# Patient Record
Sex: Female | Born: 1955
Health system: Southern US, Community
[De-identification: ages and names within clinical notes are randomized; demographics above are authoritative.]

## PROBLEM LIST (undated history)

## (undated) DIAGNOSIS — J189 Pneumonia, unspecified organism: Secondary | ICD-10-CM

## (undated) DIAGNOSIS — E669 Obesity, unspecified: Secondary | ICD-10-CM

## (undated) DIAGNOSIS — F32A Depression, unspecified: Secondary | ICD-10-CM

## (undated) DIAGNOSIS — E739 Lactose intolerance, unspecified: Secondary | ICD-10-CM

## (undated) DIAGNOSIS — R0683 Snoring: Secondary | ICD-10-CM

## (undated) DIAGNOSIS — R0600 Dyspnea, unspecified: Secondary | ICD-10-CM

## (undated) DIAGNOSIS — Z8489 Family history of other specified conditions: Secondary | ICD-10-CM

## (undated) DIAGNOSIS — I82409 Acute embolism and thrombosis of unspecified deep veins of unspecified lower extremity: Secondary | ICD-10-CM

## (undated) DIAGNOSIS — M1990 Unspecified osteoarthritis, unspecified site: Secondary | ICD-10-CM

## (undated) DIAGNOSIS — M545 Low back pain, unspecified: Secondary | ICD-10-CM

## (undated) DIAGNOSIS — I4891 Unspecified atrial fibrillation: Secondary | ICD-10-CM

## (undated) DIAGNOSIS — G8929 Other chronic pain: Secondary | ICD-10-CM

## (undated) DIAGNOSIS — M2141 Flat foot [pes planus] (acquired), right foot: Secondary | ICD-10-CM

## (undated) DIAGNOSIS — M2550 Pain in unspecified joint: Secondary | ICD-10-CM

## (undated) DIAGNOSIS — I10 Essential (primary) hypertension: Secondary | ICD-10-CM

## (undated) DIAGNOSIS — R519 Headache, unspecified: Secondary | ICD-10-CM

## (undated) DIAGNOSIS — F329 Major depressive disorder, single episode, unspecified: Secondary | ICD-10-CM

## (undated) DIAGNOSIS — E0590 Thyrotoxicosis, unspecified without thyrotoxic crisis or storm: Secondary | ICD-10-CM

## (undated) DIAGNOSIS — R600 Localized edema: Secondary | ICD-10-CM

## (undated) DIAGNOSIS — M2142 Flat foot [pes planus] (acquired), left foot: Secondary | ICD-10-CM

## (undated) HISTORY — DX: Flat foot (pes planus) (acquired), left foot: M21.42

## (undated) HISTORY — DX: Obesity, unspecified: E66.9

## (undated) HISTORY — DX: Pain in unspecified joint: M25.50

## (undated) HISTORY — DX: Flat foot (pes planus) (acquired), right foot: M21.41

## (undated) HISTORY — PX: ENDOMETRIAL ABLATION: SHX621

## (undated) HISTORY — DX: Localized edema: R60.0

## (undated) HISTORY — PX: TONSILLECTOMY: SUR1361

## (undated) HISTORY — DX: Lactose intolerance, unspecified: E73.9

## (undated) HISTORY — DX: Snoring: R06.83

---

## 1977-10-27 HISTORY — PX: TUBAL LIGATION: SHX77

## 1999-01-31 ENCOUNTER — Ambulatory Visit (HOSPITAL_BASED_OUTPATIENT_CLINIC_OR_DEPARTMENT_OTHER): Admission: RE | Admit: 1999-01-31 | Discharge: 1999-01-31 | Payer: Self-pay | Admitting: Plastic Surgery

## 2000-08-28 ENCOUNTER — Emergency Department (HOSPITAL_COMMUNITY): Admission: EM | Admit: 2000-08-28 | Discharge: 2000-08-28 | Payer: Self-pay | Admitting: Emergency Medicine

## 2000-08-28 ENCOUNTER — Encounter: Payer: Self-pay | Admitting: Emergency Medicine

## 2001-03-02 ENCOUNTER — Other Ambulatory Visit: Admission: RE | Admit: 2001-03-02 | Discharge: 2001-03-02 | Payer: Self-pay | Admitting: Obstetrics and Gynecology

## 2001-04-09 ENCOUNTER — Ambulatory Visit (HOSPITAL_COMMUNITY): Admission: RE | Admit: 2001-04-09 | Discharge: 2001-04-09 | Payer: Self-pay | Admitting: Family Medicine

## 2001-10-26 ENCOUNTER — Inpatient Hospital Stay (HOSPITAL_COMMUNITY): Admission: EM | Admit: 2001-10-26 | Discharge: 2001-11-01 | Payer: Self-pay | Admitting: Emergency Medicine

## 2001-10-28 ENCOUNTER — Encounter: Payer: Self-pay | Admitting: Family Medicine

## 2001-11-15 ENCOUNTER — Encounter (HOSPITAL_COMMUNITY): Admission: RE | Admit: 2001-11-15 | Discharge: 2002-02-13 | Payer: Self-pay | Admitting: Endocrinology

## 2001-11-16 ENCOUNTER — Encounter: Payer: Self-pay | Admitting: Endocrinology

## 2001-11-23 ENCOUNTER — Encounter: Admission: RE | Admit: 2001-11-23 | Discharge: 2001-11-23 | Payer: Self-pay | Admitting: Family Medicine

## 2001-12-29 ENCOUNTER — Ambulatory Visit (HOSPITAL_COMMUNITY): Admission: RE | Admit: 2001-12-29 | Discharge: 2001-12-29 | Payer: Self-pay | Admitting: Cardiology

## 2002-02-16 ENCOUNTER — Ambulatory Visit (HOSPITAL_COMMUNITY): Admission: RE | Admit: 2002-02-16 | Discharge: 2002-02-16 | Payer: Self-pay | Admitting: Cardiology

## 2002-05-17 ENCOUNTER — Other Ambulatory Visit: Admission: RE | Admit: 2002-05-17 | Discharge: 2002-05-17 | Payer: Self-pay | Admitting: Obstetrics and Gynecology

## 2005-09-09 ENCOUNTER — Emergency Department (HOSPITAL_COMMUNITY): Admission: EM | Admit: 2005-09-09 | Discharge: 2005-09-09 | Payer: Self-pay | Admitting: Emergency Medicine

## 2007-02-05 ENCOUNTER — Encounter (INDEPENDENT_AMBULATORY_CARE_PROVIDER_SITE_OTHER): Payer: Self-pay | Admitting: Specialist

## 2007-02-05 ENCOUNTER — Ambulatory Visit (HOSPITAL_COMMUNITY): Admission: RE | Admit: 2007-02-05 | Discharge: 2007-02-05 | Payer: Self-pay | Admitting: Obstetrics and Gynecology

## 2009-03-13 ENCOUNTER — Encounter: Admission: RE | Admit: 2009-03-13 | Discharge: 2009-03-13 | Payer: Self-pay | Admitting: Family Medicine

## 2009-07-17 ENCOUNTER — Other Ambulatory Visit: Admission: RE | Admit: 2009-07-17 | Discharge: 2009-07-17 | Payer: Self-pay | Admitting: Family Medicine

## 2010-08-08 ENCOUNTER — Other Ambulatory Visit: Admission: RE | Admit: 2010-08-08 | Discharge: 2010-08-08 | Payer: Self-pay | Admitting: Family Medicine

## 2011-03-14 NOTE — Procedures (Signed)
[place]. [facility]  Patient:    [name], [name] Visit Number: [id number] MRN: [id number]          Service Type: CAT Location: [facility] 2899 10 Attending Physician:  Pamella Pert Dictated by:   Delrae Rend, M.D. Proc. Date: 02/16/02 Admit Date:  12/29/2001 Discharge Date: 12/29/2001   CC:         [facility]  Tera Mater. Evlyn Kanner, M.D.   Procedure Report  PROCEDURE:  Elective direct current cardioversion.  INDICATION:  [name] is a 55 year old female with a history of chronic atrial fibrillation, who underwent transesophageal echocardiogram to evaluate for left atrial appendage clot.  As there was no clot, electrical cardioversion was attempted with the intention of converting her from atrial fibrillation to normal sinus rhythm.  TECHNIQUE:  With the help of the department of anesthesia under deep sedation with IV pentothal, a synchronized electrical cardioversion was performed at 200 joules, in which the atrial fibrillation was not converted; hence, a second 300 joules and a third attempt at 300 joules was done with successful conversion from atrial fibrillation to normal sinus rhythm.  The patient tolerated the procedure well, and she remained hemodynamically stable both pre- and post procedure. Dictated by:   Delrae Rend, M.D. Attending Physician:  Pamella Pert DD:  02/16/02 TD:  02/16/02 Job: [id number]

## 2011-03-14 NOTE — Procedures (Signed)
[place]. [facility]  Patient:    [name], [name] Visit Number: [id number] MRN: [id number]          Service Type: CAT Location: [facility] 2899 10 Attending Physician:  Pamella Pert Dictated by:   Delrae Rend, M.D. Proc. Date: 02/16/02 Admit Date:  12/29/2001 Discharge Date: 12/29/2001   CC:         [facility]  Tera Mater. Evlyn Kanner, M.D.   Procedure Report  PROCEDURES: 1. Transesophageal echocardiogram. 2. Elective direct current cardioversion.  INDICATION:  Ms. Jaskowiak is a 55 year old female with history of chronic atrial fibrillation who has been adequately anticoagulated.  Now presents here for evaluation of possible electrical cardioversion.  A transesophageal echocardiogram was done to evaluate for presence of left atrial appendage clot.  Of note, the patient had undergone transesophageal echocardiogram as of December 29, 2001, and this revealed normal left ventricular systolic function, mild mitral and mild tricuspid valve regurgitation, and marked left atrial enlargement and spontaneous echo contrast in the left atrium.  Hence, plans for continued anticoagulation.  After a period of eight weeks, the patient was brought back to evaluate for presence of left atrial appendage clot.  TECHNIQUE:  Under mild sedation and local anesthetic spray, a [employer] omniplane probe was easily introduced into the upper esophagus and transesophageal echocardiogram was performed.  The patient tolerated the procedure well.  FINDINGS:  Left atrium:  The left atrium shows marked left atrial enlargement. The left atrial size is approximately 6 cm.  There was mild spontaneous echo contrast noted; however, this spontaneous contrast has markedly improved compared to the TEE done on December 29, 2001.  The left atrial appendage is well-visualized.  There is no evidence of left atrial appendage clot.  The left atrial appendage velocity  was greater than 30 cm/sec.  Left ventricle:  The left ventricle is normal.  There is normal segmental wall motion.  Right atrium:  The right atrium shows mild right atrial enlargement.  Right ventricle:  The right ventricle is normal.  Pericardium:  The pericardium is normal.  Mitral valve:  The mitral valve is normal.  There is mild central mitral valve regurgitation.  Tricuspid valve:  The tricuspid valve is normal.  There is mild tricuspid valve regurgitation.  The pulmonary artery systolic pressure is estimated at 25 mmHg.  Pulmonary valve:  The pulmonary valve is normal.  Aortic valve:  The aortic valve is normal.  There is trivial aortic valve regurgitation.  FINAL IMPRESSION: 1. Normal left ventricular systolic function, ejection fraction 55-60%. 2. Marked left atrial enlargement with mild spontaneous echo contrast,    however no evidence of left atrial appendage clot. 3. Mild mitral valve and mild tricuspid valve regurgitation.  RECOMMENDATION:  Based on transesophageal echocardiogram, since the patient has been adequately anticoagulated for a period of greater than eight weeks and as she has normal left ventricular systolic function and no other comorbidities, will proceed with direct current cardioversion.  The patient tolerated the procedure well. Dictated by:   Delrae Rend, M.D. Attending Physician:  Pamella Pert DD:  02/16/02 TD:  02/16/02 Job: [id number]

## 2011-03-14 NOTE — Consult Note (Signed)
[place]. [facility]  Patient:    [name], [name] Visit Number: [id number] MRN: [id number]          Service Type: MED Location: [phone] Attending Physician:  McDiarmid, Leighton Roach. Dictated by:   Delrae Rend, M.D. Proc. Date: 10/27/01 Admit Date:  10/26/2001   CC:         [facility]ue[facility]ienenstock McDiarmid, M.D.  Ebbie Ridge, M.D., [facility]  [facility]   Consultation Report  REFERRING PHYSICIAN: Mena Pauls, M.D.  REASON FOR CONSULTATION: Atrial fibrillation.  IMPRESSION:  1. New onset atrial fibrillation with rapid ventricular response.  The     duration of this new onset atrial fibrillation is probably about     three days prior to admission.  2. Right middle lobe pneumonia.  RECOMMENDATIONS:  1. The etiology for atrial fibrillation is probably secondary to pneumonia.     Acute coronary ischemia is very unlikely as the CPKs are negative and the     ECG did not show any changes of ischemia even with tachycardia.  Primarily     treating the pneumonia would be indicated at this time.  However, if the     atrial fibrillation persists beyond today, will perform a TEE.  Guided     cardioversion can be contemplated.  The risks and benefits of TEE and     electrical cardioversion have been explained to the patient and the     patient is willing.  Agree with starting beta-blockers and also agree with     increasing the beta-blocker dose.  However, watch for bronchospasm as the     patient also has pneumonia.  2. Will follow up with TSH.  3. The patient will also need a lipid status to evaluation for any evidence     of hypercholesterolemia.  4. The patient also gives a history of recent weight loss.  However, this was     intentional and I doubt that she is hyperthyroid clinically.  HISTORY OF PRESENT ILLNESS: [name] is a 55 year old female, who works in the [place] [employer], and  was doing well until about three days ago when on Sunday she inhaled some fumes while carpet was being put on in the airport, and since then she felt some chest discomfort and shortness of breath, and some cough.  She also felt "dizzy and high" on the day of exposure.  She had swelling of eyes and some bloody mucus from her nose the next day, and she felt tired.  She also had some wheezes at the same time. Because of these ongoing symptoms and now that she was also having increasing weakness she was [facility], where a chest x-ray revealed a right middle lobe pneumonia and also an ECG showed atrial fibrillation.  For this she was referred to the hospital for admission.  She denies any chest pain, although at the time of fume inhalation she felt tight in her chest.  She also complains of back pain which she states is increased on coughing.  She denies any palpitations.  She denies any syncope. She denies any symptoms suggestive of presyncope.  She denies any neurological weakness.  REVIEW OF SYSTEMS: As above.  There is no history of palpitations, no history of chest pain, although she had some chest tightness on the day of exposure to the fumes.  She denies any symptoms suggestive of paroxysmal nocturnal dyspnea or orthopnea.  She does state that she has lost weight, about 20 pounds in the last four months.  She states that this was intentional.  She has cut down on her eating habits and changed her eating habits since she has started working in the airport.  There is no history of heat intolerance, no history of tremor. Other systems were negative.  PAST MEDICAL HISTORY: None.  SOCIAL HISTORY: She is married, has two children.  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  FAMILY HISTORY: No history of premature coronary artery disease in the family, although there is history of diabetes in her father, who died at the age of 79 with complications of diabetes.  She has  two brothers and a sister, who are all healthy and younger to her.  CURRENT MEDICATIONS: None at home.  In the hospital she has been started on:  1. Beclomethasone inhaler.  2. Tequin 400 mg p.o. q.d.  3. Cardizem 30 mg p.o. q.i.d.  4. Heparin per pro time and INR.  5. Lopressor 25 mg p.o. b.i.d.  6. Humibid L.A. two tablets p.o. b.i.d.  7. Percocet 5/325 mg two p.o. q.4h to q.6h p.r.n. for pain.  ALLERGIES:  1. PENICILLIN.  2. CODEINE.  PHYSICAL EXAMINATION:  GENERAL: She is well built and mildly obese.  She appears to be in no acute distress.  She does appear ill.  CARDIAC: S1 is variable.  S2 is normal.  There is no gallop, no murmur.  CHEST: Bilateral equal air entry.  No crackles were appreciated.  ABDOMEN: Soft, nontender.  Bowel sounds heard in all four quadrants.  EXTREMITIES: No ankle edema.  Peripheral examination reveals all pulses to be 3+ and equal.  No carotid bruits.  No abdominal bruits.  No femoral artery bruits.  LABORATORY DATA: ECG performed on October 27, 2001 demonstrates underlying atrial fibrillation with heart rate of 106 beats per minute, with normal axis, no evidence of ischemia.  CT of the chest revealed evidence of right middle lobe pneumonia.  CBC revealed hemoglobin of 12.2, hematocrit 36.1, WBC 4.8; platelet count 212,000.  BMP is within normal limits.  SGOT, SGPT within normal limits.  CPKs and troponins have been negative for myocardial injury.  Thank you for sending this patient across to [place] for evaluation.  If you have any questions regarding her care please do not hesitate to contact me. Dictated by:   Delrae Rend, M.D. Attending Physician:  McDiarmid, Tawanna Cooler D. DD:  10/27/01 TD:  10/27/01 Job: [id number]

## 2011-03-14 NOTE — Procedures (Signed)
. [facility]  Patient:    [name], [name] Visit Number: [id number] MRN: [id number]          Service Type: CAT Location: [facility] 2899 [id number] Attending Physician:  Pamella Pert Dictated by:   Delrae Rend, M.D. Proc. Date: 12/29/01 Admit Date:  12/29/2001 Discharge Date: 12/29/2001   CC:         [facility]  Jeannett Senior A. Evlyn Kanner, M.D.   Procedure Report  PROCEDURE PERFORMED:  Transesophageal echocardiogram.  SURGEON:  Delrae Rend, M.D.  INDICATIONS:  The patient is a 55 year old female with diagnosis of atrial fibrillation secondary to hyperthyroidism, being managed by Dr. Adrian Prince. Was referred for evaluation of possible cardioversion.  A transesophageal echocardiogram is being performed to ________ a left atrial appendage clot.  DESCRIPTION OF PROCEDURE:  Under mild sedation and local anesthetic spray, an Omniplane [employer] TEE probe was easily introduced into the upper esophagus and transesophageal echocardiogram was performed.  DATA:  Left atrium:  The left atrium shows marked left atrial enlargement. There is spontaneous echo contrast noted in the left atrium.  The left atrial appendage is well-visualized.  There is no evidence of left atrial appendage clot.  The left atrial appendage velocities are greater than 40 cm per second.  Left ventricle:  The left ventricle is normal.  There is normal segmental wall motion.  Right atrium:  The right atrium is normal.  Right ventricle:  The right ventricle is normal.  Pericardium:  The pericardium was normal.  Mitral valve:  The mitral valve is normal.  There is mild mitral valve regurgitation.  Tricuspid valve:  The tricuspid valve is normal.  There is mild tricuspid regurgitation.  The pulmonary artery systolic pressure is estimated at 30 mmHg.  Pulmonary valve:  The pulmonary valve is normal.  There is ________ pulmonary valve  regurgitation.  Aortic valve:  The aortic valve is normal.  There is _______ aortic valve regurgitation.  IMPRESSION: 1. Normal left ventricular systolic function.  Ejection fraction estimated    around 55%. 2. Marked left atrial enlargement.  Spontaneous echo contrast in the left    atrium.  No evidence of thrombus in the left atrial appendage. 3. Mild mitral valve regurgitation and mild tricuspid regurgitation.  RECOMMENDATIONS:  Based on the transesophageal echocardiogram, the patient will be continued on Coumadin therapy for at least a period of four to six weeks, and she will be brought back for reevaluation by transesophageal echocardiogram to evaluate for left atrial appendage clot and also to evaluate for spontaneous echo contrast.  At that time, if there is no spontaneous echo contrast and there is no evidence of left atrial appendage clot, the patient will be electrically cardioverted at that time.  The patient has been advised to continue with her medical therapy. Dictated by:   Delrae Rend, M.D. Attending Physician:  Pamella Pert DD:  12/29/01 TD:  12/30/01 Job: [id number]

## 2011-03-14 NOTE — Discharge Summary (Signed)
[place]. [facility]  Patient:    [name], [name] Visit Number: [id number] MRN: [id number]          Service Type: MED Location: [phone] Attending Physician:  McDiarmid, Leighton Roach. Dictated by:   Lucille Passy, M.D. Admit Date:  10/26/2001 Discharge Date: 11/01/2001   CC:         Delano Metz, M.D.  Tera Mater. Evlyn Kanner, M.D.  Delrae Rend, M.D.  Dr. Karie Chimera, [facility]   Discharge Summary  DATE OF BIRTH:  Nov 21, 1955  DISCHARGE DIAGNOSES: 1. Chemical pneumonitis. 2. Atrial fibrillation. 3. Hyperthyroidism.  CONSULTATION:  Cardiology, Dr. Jacinto Halim of [facility].  DISCHARGE MEDICATIONS: 1. Coumadin 5 mg p.o. q.d. 2. Albuterol MDI two puffs q.4h. p.r.n. shortness of breath or wheezing. 3. Guaifenesin 600 mg two p.o. b.i.d. for cough. 4. Lopressor 50 mg p.o. b.i.d. for atrial fibrillation. 5. PTU 50 mg two tablets p.o. t.i.d. 6. Cardizem CD 120 mg p.o. b.i.d. 7. Ativan 1 mg p.o. q.6h. p.r.n. anxiety.  ACTIVITY:  The patient was instructed that she could return to work immediately, although she should limit her activity to avoid excessive fatigue and to not lift objects greater than 20 pounds.  DIET:  Coumadin diet as previously instructed.  SPECIAL INSTRUCTIONS:  She was instructed to return for increasing shortness of breath or chest pain.  FOLLOWUP:  Appointments include Dr. Evlyn Kanner at [facility] on November 09, 2001, at noon.  [facility].  The patient was given Dr. Fredrik Rigger hours.  Dr. Jacinto Halim at the [facility] on November 12, 2001, at 9:30 a.m. for hospital followup. On November 04, 2001, at 11:30 a.m. for an INR check.  On November 10, 2001, for an echocardiogram.  HISTORY OF PRESENT ILLNESS:  [name] is a 55 year old, Caucasian female with no significant past medical history who presented to  the [facility] on the day of admission with a three-day history of weakness, chest tightness and headache.  These symptoms started within several hours of [place] two [place] prior to admission.  The fumes caused [name] to feel "high" and to be dizzy with unclear thinking and subsequent chest tightness and severe headache and then wheezing with hot and cold spells.  A [facility] at [facility] showed a right middle lobe infiltrate which was confirmed by a chest CT and an EKG showed atrial fibrillation.  She has no prior history of arrhythmias and denied palpitations or chest pain and syncope.  She had had a cough over the last three days since her fume exposure.  LABORATORY DATA AND X-RAY FINDINGS:  White blood cells 4.8, hemoglobin 12.2, platelets 212 with normal differential.  One set of cardiac enzymes showed CK 53, CK-MB 1.2, troponin 0.01.  Sodium 137, potassium 3.6, chloride 105, CO2 25, BUN 11, creatinine 0.6, glucose 74, calcium 8.7.  Total bilirubin 0.5, Alk phos 71, AST 24, ALT 20, total protein 6.2, albumin 3.1.  HOSPITAL COURSE:  #1 - PNEUMONITIS:  The patient had a normal white blood cell count and was afebrile with no oxygen requirement throughout admission.  Her symptoms are felt to be due most likely to either a chemical pneumonitis from occupational exposure or less likely from a viral bronchitis, but there was felt to be no sign of bacterial pneumonia.  However, the patient was  empirically treated with Tequin p.o. for conservative management.  Chest x-ray on day #2 of admission showed mild cardiac enlargement with question of bronchitis, but no pneumonia.  During admission, the patient complained of some wheezing and chest tightness with some aching in the chest, but subsequent cardiac enzymes x3 total sets were normal.  The patient was monitored on telemetry during admission with  atrial fibrillation in the 90s to 100s.  The patient did have two brief episodes of heart rate increasing to the 140s to 160s, but both of these episodes resolved spontaneously and were asymptomatic.  The patient completed a seven-day course of Tequin and will not be treated with antibiotics at home.  The patient had no oxygen requirement and was never tachypneic during hospitalization.  #2 - ATRIAL FIBRILLATION:  The patient had no history of arrhythmia or heart disease.  Her atrial fibrillation was initially thought to be due to pulmonary stress, but now is most likely thought to be due to her hyperthyroidism and may have been present prior to her occupational exposure.  She was placed on heparin and Coumadin was started.  She was also started on a beta-blocker and PTU as well as a calcium channel blocker.  The patient was seen by cardiology who felt that as long as the patient was rate control she did not require TEE with cardioversion unless she fails to spontaneously convert when her hyperthyroidism is treated.  By day #7 of hospitalization, the patients INR reached 2.3, so the patient will be discharged on 5 mg p.o. q.d. of Coumadin to be closely monitored with an INR three days after discharge.  She will follow up with Dr. Jacinto Halim at [facility] and will have an echocardiogram on November 10, 2001.  They will do cardioversion if the patient does not spontaneously cardiovert when her hyperthyroidism is under control.  #3 - HYPERTHYROIDISM:  During the workup of the patients atrial fibrillation, her TSH was found to be less than 0.004 with a FT4 of 2.34 and a T3 of 5.0. She was already on a beta-blocker and subsequently started on PTU.  The patient was set up to followup with Dr. Evlyn Kanner at [facility]  for further evaluation and treatment.  We greatly appreciate Dr. Evlyn Kanner taking this patient.  The patient had had a small amount of weight loss  over the past few months prior to admission, but this weight loss was intentional and the patient really had no other obvious symptoms of hyperthyroidism.  The patient will be discharged on PTU.  DISCHARGE LABORATORY DATA AND X-RAY FINDINGS:  White blood cells 5.8, hemoglobin 13.6, platelets 230.  INR 2.3, PTT 118.  A cholesterol panel showed cholesterol 121, triglycerides 59, HDL 57, LDL 52.  Monospot negative.  DISPOSITION:  The patient has been seen in the past by her primary doctor, Dr. Janey Greaser at [facility], and has also been seen a few times by Dr. Salvadore Farber at the [facility].  However, the patient has a stop on her account at [facility] and will see Dr. Salvadore Farber for hospital followup as well as Dr. Jacinto Halim for her atrial fibrillation and INR so that these issues may receive close attention.  The patient was instructed to workout her financial issues with [facility] and then to return there for further primary care if appropriate. Dictated by:   Lucille Passy, M.D. Attending Physician:  Acquanetta Belling D. DD:  11/01/01 TD:  11/02/01 Job: [id number]

## 2011-03-14 NOTE — Op Note (Signed)
[name], [name]             ACCOUNT NO.:  [id number]   MEDICAL RECORD NO.:  [id number]          PATIENT TYPE:  AMB   LOCATION:  SDC                           FACILITY:  [facility]   PHYSICIAN:  Miguel Aschoff, M.D.       DATE OF BIRTH:  1955-11-23   DATE OF PROCEDURE:  02/05/2007  DATE OF DISCHARGE:                               OPERATIVE REPORT   PREOPERATIVE DIAGNOSIS:  Menorrhagia.   POSTOPERATIVE DIAGNOSIS:  Menorrhagia.   PROCEDURE:  Cervical dilatation, hysteroscopy, uterine curettage,  NovaSure endometrial ablation.   SURGEON:  Dr. Miguel Aschoff.   ANESTHESIA:  General.   COMPLICATIONS:  None.   JUSTIFICATION:  The patient is a 55 year old white female with very  heavy menses, now controlled with medical therapy.  She presents now to  undergo D&C, hysteroscopy and endometrial ablation in an effort to  assess the etiology of bleeding and control it.  The risks and benefits  of this procedure were discussed with the patient.   PROCEDURE:  The patient was taken to the operating and placed in the  supine position, and general anesthesia was administered.  Once this was  done, she was placed in the dorsal dorsolithotomy position, prepped and  draped in the usual sterile fashion.  Examination under anesthesia  revealed normal external genitalia, normal Bartholin and Skene's glands.  Vaginal vault revealed a significant rectocele and cystocele.  The  cervix was without gross lesion.  The uterus was normal size and shape,  retroflexed, smooth in contour.  The adnexa revealed no masses.  At this  point, a speculum was placed in the vaginal vault.  The anterior  cervical lip was grasped with a tenaculum and sounded to 8 cm.  Cervical  length of 3.5 cm was then determined.  After this was done, the cervix  was further dilated until a #25 Pratt dilator could be passed.  At this  point, the diagnostic hysteroscope was advanced through the endocervical  canal.  No endocervical lesions  were noted.  The endometrial cavity was  inspected.  No polyps or submucous myomas were found.  No lesions were  noted in the cavity.  At this point, sharp vigorous curettage was  carried out.  The tissue removed was sent for histologic study.  Once  this was completed, the NovaSure endometrial ablation unit was placed  into the uterus, and a treatment cycle was carried out after a cervical  width of 3.5 cm was determined.  Treatment cycle lasted 1 minute 15  seconds.  At completion of the treatment cycle, the instrument was  removed.  The hysteroscope was reintroduced and cavity appeared be well  coagulated.  At this point, the hysteroscope was removed.  The cervix  was then injected with 15 mL 1% Xylocaine.  Hemostasis readily achieved.  The patient was taken then to recovery room in satisfactory condition.  Estimated blood loss was approximately 30 mL.   The plan is for the patient to be discharged home.  Medications for home  include doxycycline 100 mg twice a day x3 days, Darvocet-N 100 as need  for  pain.  She was instructed to place nothing in the vagina for 2  weeks, to call for a pathology report in 5 days.  She is to call if  there are any problems such as fever, pain or heavy bleeding.      Miguel Aschoff, M.D.  Electronically Signed     AR/MEDQ  D:  02/05/2007  T:  02/05/2007  Job:  [id number]

## 2014-06-29 ENCOUNTER — Other Ambulatory Visit (HOSPITAL_COMMUNITY)
Admission: RE | Admit: 2014-06-29 | Discharge: 2014-06-29 | Disposition: A | Discharge status: Home / Self Care | Payer: BC Managed Care – PPO | Source: Ambulatory Visit | Attending: Family Medicine | Admitting: Family Medicine

## 2014-06-29 ENCOUNTER — Other Ambulatory Visit: Payer: Self-pay | Admitting: Family Medicine

## 2014-06-29 DIAGNOSIS — Z124 Encounter for screening for malignant neoplasm of cervix: Secondary | ICD-10-CM | POA: Diagnosis not present

## 2014-06-30 LAB — CYTOLOGY - PAP

## 2016-04-22 ENCOUNTER — Encounter (HOSPITAL_COMMUNITY): Payer: Self-pay | Admitting: Emergency Medicine

## 2016-04-22 ENCOUNTER — Emergency Department (HOSPITAL_COMMUNITY): Payer: Managed Care, Other (non HMO)

## 2016-04-22 ENCOUNTER — Observation Stay (HOSPITAL_COMMUNITY)
Admission: EM | Admit: 2016-04-22 | Discharge: 2016-04-24 | Disposition: A | Discharge status: Home / Self Care | Payer: Managed Care, Other (non HMO) | Attending: Internal Medicine | Admitting: Internal Medicine

## 2016-04-22 DIAGNOSIS — I480 Paroxysmal atrial fibrillation: Secondary | ICD-10-CM | POA: Diagnosis present

## 2016-04-22 DIAGNOSIS — I4891 Unspecified atrial fibrillation: Secondary | ICD-10-CM | POA: Insufficient documentation

## 2016-04-22 DIAGNOSIS — G8929 Other chronic pain: Secondary | ICD-10-CM | POA: Diagnosis not present

## 2016-04-22 DIAGNOSIS — Z6841 Body Mass Index (BMI) 40.0 and over, adult: Secondary | ICD-10-CM | POA: Diagnosis not present

## 2016-04-22 DIAGNOSIS — Z885 Allergy status to narcotic agent status: Secondary | ICD-10-CM | POA: Diagnosis not present

## 2016-04-22 DIAGNOSIS — R079 Chest pain, unspecified: Secondary | ICD-10-CM | POA: Diagnosis not present

## 2016-04-22 DIAGNOSIS — I10 Essential (primary) hypertension: Secondary | ICD-10-CM | POA: Diagnosis not present

## 2016-04-22 DIAGNOSIS — Z880 Allergy status to penicillin: Secondary | ICD-10-CM | POA: Insufficient documentation

## 2016-04-22 DIAGNOSIS — F419 Anxiety disorder, unspecified: Secondary | ICD-10-CM | POA: Diagnosis not present

## 2016-04-22 DIAGNOSIS — E876 Hypokalemia: Secondary | ICD-10-CM | POA: Diagnosis present

## 2016-04-22 HISTORY — DX: Major depressive disorder, single episode, unspecified: F32.9

## 2016-04-22 HISTORY — DX: Other chronic pain: G89.29

## 2016-04-22 HISTORY — DX: Acute embolism and thrombosis of unspecified deep veins of unspecified lower extremity: I82.409

## 2016-04-22 HISTORY — DX: Thyrotoxicosis, unspecified without thyrotoxic crisis or storm: E05.90

## 2016-04-22 HISTORY — DX: Depression, unspecified: F32.A

## 2016-04-22 HISTORY — DX: Low back pain, unspecified: M54.50

## 2016-04-22 HISTORY — DX: Low back pain: M54.5

## 2016-04-22 HISTORY — DX: Unspecified atrial fibrillation: I48.91

## 2016-04-22 HISTORY — DX: Essential (primary) hypertension: I10

## 2016-04-22 HISTORY — DX: Pneumonia, unspecified organism: J18.9

## 2016-04-22 LAB — I-STAT CHEM 8, ED
BUN: 18 mg/dL (ref 6–20)
Calcium, Ion: 1.09 mmol/L — ABNORMAL LOW (ref 1.13–1.30)
Chloride: 101 mmol/L (ref 101–111)
Creatinine, Ser: 0.4 mg/dL — ABNORMAL LOW (ref 0.44–1.00)
Glucose, Bld: 74 mg/dL (ref 65–99)
HCT: 46 % (ref 36.0–46.0)
Hemoglobin: 15.6 g/dL — ABNORMAL HIGH (ref 12.0–15.0)
Potassium: 2.8 mmol/L — ABNORMAL LOW (ref 3.5–5.1)
Sodium: 139 mmol/L (ref 135–145)
TCO2: 24 mmol/L (ref 0–100)

## 2016-04-22 LAB — I-STAT TROPONIN, ED: Troponin i, poc: 0 ng/mL (ref 0.00–0.08)

## 2016-04-22 LAB — CBC
HCT: 43.8 % (ref 36.0–46.0)
Hemoglobin: 14.8 g/dL (ref 12.0–15.0)
MCH: 28 pg (ref 26.0–34.0)
MCHC: 33.8 g/dL (ref 30.0–36.0)
MCV: 82.8 fL (ref 78.0–100.0)
Platelets: 208 10*3/uL (ref 150–400)
RBC: 5.29 MIL/uL — ABNORMAL HIGH (ref 3.87–5.11)
RDW: 13.6 % (ref 11.5–15.5)
WBC: 4.4 10*3/uL (ref 4.0–10.5)

## 2016-04-22 LAB — BASIC METABOLIC PANEL
Anion gap: 10 (ref 5–15)
BUN: 16 mg/dL (ref 6–20)
CO2: 22 mmol/L (ref 22–32)
Calcium: 9 mg/dL (ref 8.9–10.3)
Chloride: 105 mmol/L (ref 101–111)
Creatinine, Ser: 0.58 mg/dL (ref 0.44–1.00)
GFR calc Af Amer: >60 mL/min (ref >60)
GFR calc non Af Amer: >60 mL/min (ref >60)
Glucose, Bld: 76 mg/dL (ref 65–99)
Potassium: 2.8 mmol/L — ABNORMAL LOW (ref 3.5–5.1)
Sodium: 137 mmol/L (ref 135–145)

## 2016-04-22 LAB — TSH: TSH: 3.873 u[IU]/mL (ref 0.350–4.500)

## 2016-04-22 LAB — MAGNESIUM: Magnesium: 1.6 mg/dL — ABNORMAL LOW (ref 1.7–2.4)

## 2016-04-22 LAB — APTT: aPTT: 32 seconds (ref 24–37)

## 2016-04-22 LAB — PROTIME-INR
INR: 1.18 (ref 0.00–1.49)
Prothrombin Time: 15.2 seconds (ref 11.6–15.2)

## 2016-04-22 LAB — T4, FREE: Free T4: 1.35 ng/dL — ABNORMAL HIGH (ref 0.61–1.12)

## 2016-04-22 MED ORDER — GI COCKTAIL ~~LOC~~
30.0000 mL | Freq: Once | ORAL | Status: AC
  Administered 2016-04-22: 30 mL via ORAL
  Filled 2016-04-22: qty 30

## 2016-04-22 MED ORDER — IBUPROFEN 200 MG PO TABS
200.0000 mg | ORAL_TABLET | Freq: Four times a day (QID) | ORAL | Status: DC | PRN
  Administered 2016-04-23: 200 mg via ORAL
  Filled 2016-04-22: qty 1

## 2016-04-22 MED ORDER — DULOXETINE HCL 60 MG PO CPEP
120.0000 mg | ORAL_CAPSULE | Freq: Every day | ORAL | Status: DC
  Administered 2016-04-23 – 2016-04-24 (×2): 120 mg via ORAL
  Filled 2016-04-22 (×2): qty 2

## 2016-04-22 MED ORDER — MAGNESIUM SULFATE 4 GM/100ML IV SOLN
4.0000 g | Freq: Once | INTRAVENOUS | Status: AC
  Administered 2016-04-22: 4 g via INTRAVENOUS
  Filled 2016-04-22: qty 100

## 2016-04-22 MED ORDER — LOSARTAN POTASSIUM-HCTZ 100-25 MG PO TABS: 1.0000 | ORAL_TABLET | Freq: Every day | ORAL | Status: DC

## 2016-04-22 MED ORDER — POTASSIUM CHLORIDE CRYS ER 20 MEQ PO TBCR
40.0000 meq | EXTENDED_RELEASE_TABLET | Freq: Once | ORAL | Status: AC
Start: 2016-04-22 — End: 2016-04-22
  Administered 2016-04-22: 40 meq via ORAL
  Filled 2016-04-22: qty 2

## 2016-04-22 MED ORDER — TRAMADOL HCL 50 MG PO TABS: 50.0000 mg | ORAL_TABLET | Freq: Four times a day (QID) | ORAL | Status: DC | PRN

## 2016-04-22 MED ORDER — CLONAZEPAM 0.5 MG PO TABS
0.5000 mg | ORAL_TABLET | Freq: Three times a day (TID) | ORAL | Status: DC | PRN
  Administered 2016-04-22 – 2016-04-24 (×4): 0.5 mg via ORAL
  Filled 2016-04-22 (×5): qty 1

## 2016-04-22 MED ORDER — HYDROCHLOROTHIAZIDE 25 MG PO TABS
25.0000 mg | ORAL_TABLET | Freq: Every day | ORAL | Status: DC
  Administered 2016-04-23 – 2016-04-24 (×2): 25 mg via ORAL
  Filled 2016-04-22 (×2): qty 1

## 2016-04-22 MED ORDER — LOSARTAN POTASSIUM 50 MG PO TABS
100.0000 mg | ORAL_TABLET | Freq: Every day | ORAL | Status: DC
  Administered 2016-04-23 – 2016-04-24 (×2): 100 mg via ORAL
  Filled 2016-04-22 (×2): qty 2

## 2016-04-22 MED ORDER — ACETAMINOPHEN 325 MG PO TABS
650.0000 mg | ORAL_TABLET | ORAL | Status: DC | PRN
  Administered 2016-04-22 – 2016-04-24 (×4): 650 mg via ORAL
  Filled 2016-04-22 (×4): qty 2

## 2016-04-22 MED ORDER — ACETAMINOPHEN 500 MG PO TABS
1000.0000 mg | ORAL_TABLET | Freq: Once | ORAL | Status: AC
  Administered 2016-04-22: 1000 mg via ORAL
  Filled 2016-04-22: qty 2

## 2016-04-22 MED ORDER — ENOXAPARIN SODIUM 150 MG/ML ~~LOC~~ SOLN
1.0000 mg/kg | Freq: Two times a day (BID) | SUBCUTANEOUS | Status: DC
  Administered 2016-04-22 – 2016-04-24 (×4): 130 mg via SUBCUTANEOUS
  Filled 2016-04-22 (×5): qty 0.88

## 2016-04-22 MED ORDER — SODIUM CHLORIDE 0.9% FLUSH
3.0000 mL | Freq: Two times a day (BID) | INTRAVENOUS | Status: DC
  Administered 2016-04-23 – 2016-04-24 (×3): 3 mL via INTRAVENOUS

## 2016-04-22 MED ORDER — FAMOTIDINE IN NACL 20-0.9 MG/50ML-% IV SOLN
20.0000 mg | Freq: Once | INTRAVENOUS | Status: AC
  Administered 2016-04-22: 20 mg via INTRAVENOUS
  Filled 2016-04-22: qty 50

## 2016-04-22 MED ORDER — ONDANSETRON HCL 4 MG/2ML IJ SOLN: 4.0000 mg | Freq: Four times a day (QID) | INTRAMUSCULAR | Status: DC | PRN

## 2016-04-22 MED ORDER — POTASSIUM CHLORIDE CRYS ER 20 MEQ PO TBCR
20.0000 meq | EXTENDED_RELEASE_TABLET | Freq: Two times a day (BID) | ORAL | Status: DC
  Administered 2016-04-22 – 2016-04-24 (×4): 20 meq via ORAL
  Filled 2016-04-22 (×4): qty 1

## 2016-04-22 MED ORDER — ACETAMINOPHEN 325 MG PO TABS: 650.0000 mg | ORAL_TABLET | Freq: Four times a day (QID) | ORAL | Status: DC | PRN

## 2016-04-22 NOTE — ED Notes (Signed)
Ortiz, MD at bedside.  

## 2016-04-22 NOTE — ED Provider Notes (Signed)
CSN: [id number]     Arrival date & time 04/22/16  1431 History   First MD Initiated Contact with Patient 04/22/16 1506     Chief Complaint  Patient presents with  . Atrial Fibrillation  . Chest Pain     (Consider location/radiation/quality/duration/timing/severity/associated sxs/prior Treatment) HPI Comments: 60 year old female with history of high blood pressure, obesity, atrial fibrillation years ago that resolved after thyroid treatment presents with recurrent atrial fibrillation and chest pain started last night. No heart failure heart attack history. No classic blood clot risk factors or blood clot history. No fevers or chills. Patient started feeling unwell no energy yesterday. Patient does not feel palpitations or irregular heart rate. Reasons on aspirin AV daily since her brief episode of atrial fibrillation years ago. Patient has mild pressure radiation to her back. No tearing sensation. Pain is mild at this time. No exertional symptoms.  Patient is a 60 y.o. female presenting with atrial fibrillation and chest pain. The history is provided by the patient.  Atrial Fibrillation Associated symptoms include chest pain and headaches. Pertinent negatives include no abdominal pain and no shortness of breath.  Chest Pain Associated symptoms: fatigue and headache   Associated symptoms: no abdominal pain, no back pain, no fever, no shortness of breath and not vomiting     Past Medical History  Diagnosis Date  . Hypertension   . Back pain    Past Surgical History  Procedure Laterality Date  . Ablation    . Tubal ligation     No family history on file. Social History  Substance Use Topics  . Smoking status: Never Smoker   . Smokeless tobacco: None  . Alcohol Use: Yes     Comment: occasionally-- wine in the evening   OB History    No data available     Review of Systems  Constitutional: Positive for fatigue. Negative for fever, chills and unexpected weight change.  HENT:  Negative for congestion.   Eyes: Negative for visual disturbance.  Respiratory: Negative for shortness of breath.   Cardiovascular: Positive for chest pain.  Gastrointestinal: Negative for vomiting and abdominal pain.  Genitourinary: Negative for dysuria and flank pain.  Musculoskeletal: Negative for back pain, neck pain and neck stiffness.  Skin: Negative for rash.  Neurological: Positive for headaches. Negative for light-headedness.      Allergies  Penicillins and Codeine  Home Medications   Prior to Admission medications   Medication Sig Start Date End Date Taking? Authorizing Provider  amLODipine (NORVASC) 5 MG tablet Take 1 tablet by mouth daily. 04/19/16  Yes Historical Provider, MD  clonazePAM (KLONOPIN) 0.5 MG tablet Take 1 tablet by mouth 3 (three) times daily as needed. As needed for anxiety 03/21/16  Yes Historical Provider, MD  DULoxetine (CYMBALTA) 60 MG capsule Take 2 capsules by mouth daily. 04/02/16  Yes Historical Provider, MD  ibuprofen (ADVIL,MOTRIN) 200 MG tablet Take 200 mg by mouth every 6 (six) hours as needed for headache or mild pain.   Yes Historical Provider, MD  losartan-hydrochlorothiazide (HYZAAR) 100-25 MG tablet Take 1 tablet by mouth daily. 04/19/16  Yes Historical Provider, MD  traMADol (ULTRAM) 50 MG tablet Take 1 tablet by mouth 2 (two) times daily as needed. 04/20/16  Yes Historical Provider, MD   BP 106/70 mmHg  Pulse 83  Temp(Src) 98.4 F (36.9 C) (Oral)  Resp 19  Ht 5\' 3"  (1.6 m)  Wt 292 lb (132.45 kg)  BMI 51.74 kg/m2  SpO2 98% Physical Exam  Constitutional: She  is oriented to person, place, and time. She appears well-developed and well-nourished.  HENT:  Head: Normocephalic and atraumatic.  Eyes: Conjunctivae are normal. Right eye exhibits no discharge. Left eye exhibits no discharge.  Neck: Normal range of motion. Neck supple. No tracheal deviation present.  Cardiovascular: Normal rate, regular rhythm and intact distal pulses.    Pulmonary/Chest: Effort normal and breath sounds normal.  Abdominal: Soft. She exhibits no distension. There is no tenderness. There is no guarding.  Musculoskeletal: She exhibits tenderness (mild tender anterior chest to palpation). She exhibits no edema.  Neurological: She is alert and oriented to person, place, and time.  Skin: Skin is warm. No rash noted.  Psychiatric: She has a normal mood and affect.  Nursing note and vitals reviewed.   ED Course  Procedures (including critical care time) Labs Review Labs Reviewed  BASIC METABOLIC PANEL  CBC  TSH  T4, FREE  I-STAT TROPOININ, ED    Imaging Review Dg Chest 2 View  04/22/2016  CLINICAL DATA:  Chest pain since last night EXAM: CHEST  2 VIEW COMPARISON:  None. FINDINGS: Mild peribronchial thickening. Heart and mediastinal contours are within normal limits. No focal opacities or effusions. No acute bony abnormality. IMPRESSION: Mild bronchitic changes. Electronically Signed   By: Charlett NoseKevin  Dover M.D.   On: 04/22/2016 15:25   I have personally reviewed and evaluated these images and lab results as part of my medical decision-making.   EKG Interpretation   Date/Time:  Tuesday April 22 2016 14:41:51 EDT Ventricular Rate:  77 PR Interval:    QRS Duration: 108 QT Interval:  387 QTC Calculation: 438 R Axis:   -127 Text Interpretation:  Atrial fibrillation Right axis deviation Borderline  T abnormalities, diffuse leads Confirmed by ZAVITZ MD, JOSHUA [phone]([id number]) on  04/22/2016 3:06:24 PM      MDM   Final diagnoses:  Atrial fibrillation, unspecified type (HCC)  Chest pain, unspecified chest pain type   Patient presents with atrial fibrillation, onset time unknown, rate controlled. Patient has and her min and chest discomfort since last night. Plan for cardiac screen. Patient had aspirin prior to arrival. No chol or FH.  Non smoker.    HEART score 4.  Plan for tele obs.   CHADSVASC 2.    The patients results and plan were  reviewed and discussed.   Any x-rays performed were independently reviewed by myself.   Differential diagnosis were considered with the presenting HPI.  Medications  acetaminophen (TYLENOL) tablet 1,000 mg (not administered)  gi cocktail (Maalox,Lidocaine,Donnatal) (not administered)    Filed Vitals:   04/22/16 1435 04/22/16 1449 04/22/16 1500  BP:  111/75 106/70  Pulse:  70 83  Temp:  98.4 F (36.9 C)   TempSrc:  Oral   Resp:  20 19  Height:  5\' 3"  (1.6 m)   Weight:  292 lb (132.45 kg)   SpO2: 98% 100% 98%    Final diagnoses:  Atrial fibrillation, unspecified type (HCC)  Chest pain, unspecified chest pain type    Admission/ observation were discussed with the admitting physician, patient and/or family and they are comfortable with the plan.     Blane OharaJoshua Zavitz, MD 04/24/16 [phone]

## 2016-04-22 NOTE — H&P (Signed)
History and Physical    [name] [id number] DOB: [birth year] DOA: 04/22/2016  PCP: Cala BradfordWHITE,CYNTHIA S, MD   Patient coming from: Home  Chief Complaint: Chest Pain.  HPI: [name] is a 60 y.o. female with medical history significant of hypertension, chronic back pain, anxiety, Atrial fibrillation 15 years ago with electrical cardioversion who comes to the emergency department due to pressure-like chest pain since today around 01:30.  Per patient, since earlier in the day yesterday she was experiencing heartburn. She states that subsequently the heartburn sensation worsen at night, but she was able to go to sleep. Around 01:30 today she woke up with pressure-like chest pain, radiated to her back and both shoulders associated with nausea and mild dyspnea. She denies dizziness, diaphoresis or palpitations at the moment. However, she states that she has been under increased stress over the past 3 months and has occasionally experienced some mild palpitations. She denies PND, orthopnea, but complains of occasional mild pitting edema of the lower extremities.   She states that she took ginger ale and ibuprofen, but was unable to sleep right away.. She was able to return to sleep around 04:30. She woke up again at 07:40 and noticed that her symptoms were less intense, but still present, so she decided to visit her doctor.   Once she arrived to the doctor's office, an EKG was performed, which show atrial fibrillation. She states that she had a previous episode of atrial fibrillation about 15-17 years ago, which needed electrical cardioversion. She states that she has not had any issues since then.  ED Course: The patient was in no acute distress. Workup shows hypokalemia and hypomagnesemia. She received electrolyte replacement.  Review of Systems: As per HPI otherwise 10 point review of systems negative.  Past Medical History  Diagnosis Date  . Hypertension   . Back pain      Past Surgical History  Procedure Laterality Date  . Ablation    . Tubal ligation       reports that she has never smoked. She does not have any smokeless tobacco history on file. She reports that she drinks alcohol. She reports that she does not use illicit drugs.  Allergies  Allergen Reactions  . Penicillins Hives and Shortness Of Breath  . Codeine Hives    History reviewed. No pertinent family history.   Prior to Admission medications   Medication Sig Start Date End Date Taking? Authorizing Provider  amLODipine (NORVASC) 5 MG tablet Take 1 tablet by mouth daily. 04/19/16  Yes Historical Provider, MD  clonazePAM (KLONOPIN) 0.5 MG tablet Take 1 tablet by mouth 3 (three) times daily as needed. As needed for anxiety 03/21/16  Yes Historical Provider, MD  DULoxetine (CYMBALTA) 60 MG capsule Take 2 capsules by mouth daily. 04/02/16  Yes Historical Provider, MD  ibuprofen (ADVIL,MOTRIN) 200 MG tablet Take 200 mg by mouth every 6 (six) hours as needed for headache or mild pain.   Yes Historical Provider, MD  losartan-hydrochlorothiazide (HYZAAR) 100-25 MG tablet Take 1 tablet by mouth daily. 04/19/16  Yes Historical Provider, MD  traMADol (ULTRAM) 50 MG tablet Take 1 tablet by mouth 2 (two) times daily as needed. 04/20/16  Yes Historical Provider, MD    Physical Exam: Filed Vitals:   04/22/16 1747 04/22/16 1800 04/22/16 1815 04/22/16 1930  BP: 107/50 104/69 109/75 110/67  Pulse: 95 79 79 77  Temp:      TempSrc:      Resp: 25 24 17  23  Height:      Weight:      SpO2: 99% 97% 97% 94%      Constitutional: NAD, calm, comfortable Filed Vitals:   04/22/16 1747 04/22/16 1800 04/22/16 1815 04/22/16 1930  BP: 107/50 104/69 109/75 110/67  Pulse: 95 79 79 77  Temp:      TempSrc:      Resp: Height:      Weight:      SpO2: 99% 97% 97% 94%   Eyes: PERRL, lids and conjunctivae normal ENMT: Mucous membranes are moist. Posterior pharynx clear of any exudate or  lesions. Neck: normal, supple, no masses, no thyromegaly Respiratory: clear to auscultation bilaterally, no wheezing, no crackles. Normal respiratory effort. No accessory muscle use.  Cardiovascular: Irregularly irregular, no murmurs / rubs / gallops. Trace lower extremities pitting edema. 2+ pedal pulses. No carotid bruits.  Abdomen: Obese, no tenderness, no masses palpated. No hepatosplenomegaly. Bowel sounds positive.  Musculoskeletal: no clubbing / cyanosis. No joint deformity upper and lower extremities. Good ROM, no contractures. Normal muscle tone.  Skin: no rashes, lesions, ulcers. No induration Neurologic: CN 2-12 grossly intact. Sensation intact, DTR normal. Strength 5/5 in all 4.  Psychiatric: Normal judgment and insight. Alert and oriented x 4. Normal mood.    Labs on Admission: I have personally reviewed following labs and imaging studies  CBC:  Recent Labs Lab 04/22/16 1721 04/22/16 1731  WBC 4.4  --   HGB 14.8 15.6*  HCT 43.8 46.0  MCV 82.8  --   PLT 208  --    Basic Metabolic Panel:  Recent Labs Lab 04/22/16 1721 04/22/16 1731 04/22/16 1851  NA 137 139  --   K 2.8* 2.8*  --   CL 105 101  --   CO2 22  --   --   GLUCOSE 76 74  --   BUN 16 18  --   CREATININE 0.58 0.40*  --   CALCIUM 9.0  --   --   MG  --   --  1.6*   GFR: Estimated Creatinine Clearance: 102.1 mL/min (by C-G formula based on Cr of 0.4).  Thyroid Function Tests:  Recent Labs  04/22/16 1710 04/22/16 1721  TSH  --  3.873  FREET4 1.35*  --      Radiological Exams on Admission: Dg Chest 2 View  04/22/2016  CLINICAL DATA:  Chest pain since last night EXAM: CHEST  2 VIEW COMPARISON:  None. FINDINGS: Mild peribronchial thickening. Heart and mediastinal contours are within normal limits. No focal opacities or effusions. No acute bony abnormality. IMPRESSION: Mild bronchitic changes. Electronically Signed   By: Charlett Nose M.D.   On: 04/22/2016 15:25    EKG: Independently  reviewed. Vent. rate 77 BPM PR interval * ms QRS duration 108 ms QT/QTc 387/438 ms P-R-T axes * 233 4 Atrial fibrillation Right axis deviation Borderline T abnormalities, diffuse leads   Assessment/Plan Principal Problem:   Chest pain Admit to telemetry/observation. Supplemental oxygen when necessary. Trend troponin levels. Check echocardiogram in the morning.  Active Problems:   Paroxysmal a-fib (HCC) CHADSVAScscore is 2. Rate is currently controlled. Correct electrolytes. Started on Lovenox for thromboembolic event prevention or possible future cardioversion. Check echocardiogram in the morning. Cardiology evaluation as in or outpatient.    Hypokalemia Continue potassium replacement. Monitor potassium level. The patient was told that she will need potassium replacement on a regular basis.    Hypomagnesemia. Magnesium sulfate IVP was ordered. I recommended  the patient to take magnesium supplements on a regular basis.    Morbid obesity (HCC) Is significant lifestyle modifications.    Hypertension Hold amlodipine as BP is currently "soft". Consider starting beta blocker. Continue ARB and hydrochlorothiazide with electrolyte replacement.   Blood pressure monitoring.   DVT prophylaxis: Full dose Lovenox. Code Status: Full code. Family Communication:  Disposition Plan: Telemetry monitoring, troponin level trending, echocardiogram. Consults called:  Admission status: Observation/Telemetry   Bobette Moavid Manuel Ortiz MD [facility] Pager [phone].  If 7PM-7AM, please contact night-coverage [email or web address] Password [email or web address]  04/22/2016, 7:46 PM

## 2016-04-22 NOTE — ED Notes (Signed)
[facility] with [facility]/o chest pain started last night, c/o indigestion, drank some ginger ale without relief this am, hx of A Fib in past, but does not take meds for it.  Received NTG x 2 enroute, brought pain from 10/10 to 4/10, has taken ASA 325mg  also. C/o head ache at present, with pain across shoulders.

## 2016-04-22 NOTE — ED Notes (Signed)
Patient transported to X-ray 

## 2016-04-23 ENCOUNTER — Observation Stay (HOSPITAL_COMMUNITY): Payer: Managed Care, Other (non HMO)

## 2016-04-23 ENCOUNTER — Other Ambulatory Visit (HOSPITAL_COMMUNITY): Payer: Managed Care, Other (non HMO)

## 2016-04-23 ENCOUNTER — Encounter (HOSPITAL_COMMUNITY): Payer: Self-pay | Admitting: General Practice

## 2016-04-23 DIAGNOSIS — E876 Hypokalemia: Secondary | ICD-10-CM | POA: Diagnosis not present

## 2016-04-23 DIAGNOSIS — R079 Chest pain, unspecified: Secondary | ICD-10-CM

## 2016-04-23 DIAGNOSIS — I10 Essential (primary) hypertension: Secondary | ICD-10-CM

## 2016-04-23 DIAGNOSIS — I480 Paroxysmal atrial fibrillation: Secondary | ICD-10-CM

## 2016-04-23 LAB — COMPREHENSIVE METABOLIC PANEL
ALT: 19 U/L (ref 14–54)
AST: 20 U/L (ref 15–41)
Albumin: 3.4 g/dL — ABNORMAL LOW (ref 3.5–5.0)
Alkaline Phosphatase: 49 U/L (ref 38–126)
Anion gap: 7 (ref 5–15)
BUN: 13 mg/dL (ref 6–20)
CO2: 24 mmol/L (ref 22–32)
Calcium: 8.5 mg/dL — ABNORMAL LOW (ref 8.9–10.3)
Chloride: 107 mmol/L (ref 101–111)
Creatinine, Ser: 0.57 mg/dL (ref 0.44–1.00)
GFR calc Af Amer: >60 mL/min (ref >60)
GFR calc non Af Amer: >60 mL/min (ref >60)
Glucose, Bld: 128 mg/dL — ABNORMAL HIGH (ref 65–99)
Potassium: 3.2 mmol/L — ABNORMAL LOW (ref 3.5–5.1)
Sodium: 138 mmol/L (ref 135–145)
Total Bilirubin: 0.5 mg/dL (ref 0.3–1.2)
Total Protein: 5.8 g/dL — ABNORMAL LOW (ref 6.5–8.1)

## 2016-04-23 LAB — CBC WITH DIFFERENTIAL/PLATELET
Basophils Absolute: 0 10*3/uL (ref 0.0–0.1)
Basophils Relative: 0 %
Eosinophils Absolute: 0.1 10*3/uL (ref 0.0–0.7)
Eosinophils Relative: 3 %
HCT: 43 % (ref 36.0–46.0)
Hemoglobin: 14.2 g/dL (ref 12.0–15.0)
Lymphocytes Relative: 33 %
Lymphs Abs: 1.1 10*3/uL (ref 0.7–4.0)
MCH: 27.4 pg (ref 26.0–34.0)
MCHC: 33 g/dL (ref 30.0–36.0)
MCV: 82.9 fL (ref 78.0–100.0)
Monocytes Absolute: 0.5 10*3/uL (ref 0.1–1.0)
Monocytes Relative: 17 %
Neutro Abs: 1.6 10*3/uL — ABNORMAL LOW (ref 1.7–7.7)
Neutrophils Relative %: 47 %
Platelets: 220 10*3/uL (ref 150–400)
RBC: 5.19 MIL/uL — ABNORMAL HIGH (ref 3.87–5.11)
RDW: 13.8 % (ref 11.5–15.5)
WBC: 3.3 10*3/uL — ABNORMAL LOW (ref 4.0–10.5)

## 2016-04-23 LAB — TROPONIN I
Troponin I: <0.03 ng/mL (ref <0.03)
Troponin I: <0.03 ng/mL (ref <0.03)

## 2016-04-23 MED ORDER — POTASSIUM CHLORIDE 20 MEQ PO PACK
40.0000 meq | PACK | Freq: Once | ORAL | Status: AC
  Administered 2016-04-23: 40 meq via ORAL
  Filled 2016-04-23: qty 2

## 2016-04-23 MED ORDER — PANTOPRAZOLE SODIUM 40 MG PO TBEC
40.0000 mg | DELAYED_RELEASE_TABLET | Freq: Every day | ORAL | Status: DC
  Administered 2016-04-23 – 2016-04-24 (×2): 40 mg via ORAL
  Filled 2016-04-23 (×2): qty 1

## 2016-04-23 MED ORDER — SUCRALFATE 1 GM/10ML PO SUSP
1.0000 g | Freq: Three times a day (TID) | ORAL | Status: DC
  Administered 2016-04-23 – 2016-04-24 (×4): 1 g via ORAL
  Filled 2016-04-23 (×4): qty 10

## 2016-04-23 MED ORDER — TECHNETIUM TC 99M TETROFOSMIN IV KIT
30.0000 | PACK | Freq: Once | INTRAVENOUS | Status: AC | PRN
  Administered 2016-04-23: 30 via INTRAVENOUS

## 2016-04-23 MED ORDER — POTASSIUM CHLORIDE 10 MEQ/100ML IV SOLN
10.0000 meq | INTRAVENOUS | Status: AC
  Administered 2016-04-23: 10 meq via INTRAVENOUS
  Filled 2016-04-23: qty 100

## 2016-04-23 NOTE — Progress Notes (Signed)
PROGRESS NOTE    [name]  [id number] DOB: 1956-05-24 DOA: 04/22/2016 PCP: Cala BradfordWHITE,CYNTHIA S, MD  Outpatient Specialists:   Brief Narrative: AS PER DR. DAVID MANUEL ORTIZ - 60 y.o. female with medical history significant of hypertension, chronic back pain, anxiety, Atrial fibrillation 15 years ago with electrical cardioversion who comes to the emergency department due to pressure-like chest pain since today around 01:30. Per patient, since earlier in the day yesterday she was experiencing heartburn. She states that subsequently the heartburn sensation worsen at night, but she was able to go to sleep. Around 01:30 today she woke up with pressure-like chest pain, radiated to her back and both shoulders associated with nausea and mild dyspnea. She denies dizziness, diaphoresis or palpitations at the moment. However, she states that she has been under increased stress over the past 3 months and has occasionally experienced some mild palpitations. She denies PND, orthopnea, but complains of occasional mild pitting edema of the lower extremities. She states that she took ginger ale and ibuprofen, but was unable to sleep right away.. She was able to return to sleep around 04:30. She woke up again at 07:40 and noticed that her symptoms were less intense, but still present, so she decided to visit her doctor. Once she arrived to the doctor's office, an EKG was performed, which show atrial fibrillation. She states that she had a previous episode of atrial fibrillation about 15-17 years ago, which needed electrical cardioversion. She states that she has not had any issues since then.   Assessment & Plan:   Principal Problem:   Chest pain Active Problems:   Hypokalemia   Morbid obesity (HCC)   Hypertension   Paroxysmal a-fib (HCC)   Hypomagnesemia  - Chest pain: Atypical and typical features. Negative cardiac enzymes. EKG reveals atrial fibrillation. ECHO already ordered. Will proceed with  cardiac stress test. - Hypokalemia - Replete - Atrial fibrillation - Rate controlled. Elevated free T4 with normal TSH (subclinical). Repeat thyroid function test with PCP. PCP to consider referral to an Endocrinologist. Check D dimer. Prior history of afib reported. ECHO and stress test as above. - Obesity - Consider outpatient work up for OSA - Anxiety/stress - Recent history. PCP was managing prior to admission - History of NSAIDs use - Counseled to DC NSAIDs. Protonix and sucralfate. Low threshold for EGD if symptoms do not resolve. - Hypertension - Optimize.  DVT prophylaxis: Full dose Lovenox. Code Status: Full code. Family Communication:  Disposition Plan: Telemetry monitoring, troponin level trending, echocardiogram. Consults called:  Admission status: Observation/Telemetry  Subjective: No new complaints. No chest pain. No SOB.  Objective: Filed Vitals:   04/22/16 1815 04/22/16 1930 04/22/16 2020 04/23/16 0654  BP: 109/75 110/67 121/70 112/66  Pulse: 79 77 75 97  Temp:   98.2 F (36.8 C) 97.7 F (36.5 C)  TempSrc:   Oral Oral  Resp: 17 23 20    Height:   5\' 3"  (1.6 m)   Weight:   133.358 kg (294 lb)   SpO2: 97% 94% 94% 99%   No intake or output data in the 24 hours ending 04/23/16 0847 Filed Weights   04/22/16 1449 04/22/16 2020  Weight: 132.45 kg (292 lb) 133.358 kg (294 lb)    Examination:  General exam: Obese. Appears calm and comfortable  Respiratory system: Clear to auscultation. Respiratory effort normal. Cardiovascular system: S1 & S2, irregular.   Gastrointestinal system: Abdomen is nondistended, soft and nontender. Organs are difficult to assess. Central nervous system: Alert and  oriented. Moves all limbs. Extremities: No edema.   Data Reviewed: I have personally reviewed following labs and imaging studies  CBC:  Recent Labs Lab 04/22/16 1721 04/22/16 1731 04/23/16 0432  WBC 4.4  --  3.3*  NEUTROABS  --   --  1.6*  HGB 14.8 15.6* 14.2  HCT  43.8 46.0 43.0  MCV 82.8  --  82.9  PLT 208  --  220   Basic Metabolic Panel:  Recent Labs Lab 04/22/16 1721 04/22/16 1731 04/22/16 1851 04/23/16 0432  NA 137 139  --  138  K 2.8* 2.8*  --  3.2*  CL 105 101  --  107  CO2 22  --   --  24  GLUCOSE 76 74  --  128*  BUN 16 18  --  13  CREATININE 0.58 0.40*  --  0.57  CALCIUM 9.0  --   --  8.5*  MG  --   --  1.6*  --    GFR: Estimated Creatinine Clearance: 102.6 mL/min (by C-G formula based on Cr of 0.57). Liver Function Tests:  Recent Labs Lab 04/23/16 0432  AST 20  ALT 19  ALKPHOS 49  BILITOT 0.5  PROT 5.8*  ALBUMIN 3.4*   No results for input(s): LIPASE, AMYLASE in the last 168 hours. No results for input(s): AMMONIA in the last 168 hours. Coagulation Profile:  Recent Labs Lab 04/22/16 2305  INR 1.18   Cardiac Enzymes:  Recent Labs Lab 04/22/16 2305 04/23/16 0432  TROPONINI <0.03 <0.03   BNP (last 3 results) No results for input(s): PROBNP in the last 8760 hours. HbA1C: No results for input(s): HGBA1C in the last 72 hours. CBG: No results for input(s): GLUCAP in the last 168 hours. Lipid Profile: No results for input(s): CHOL, HDL, LDLCALC, TRIG, CHOLHDL, LDLDIRECT in the last 72 hours. Thyroid Function Tests:  Recent Labs  04/22/16 1710 04/22/16 1721  TSH  --  3.873  FREET4 1.35*  --    Anemia Panel: No results for input(s): VITAMINB12, FOLATE, FERRITIN, TIBC, IRON, RETICCTPCT in the last 72 hours. Urine analysis: No results found for: COLORURINE, APPEARANCEUR, LABSPEC, PHURINE, GLUCOSEU, HGBUR, BILIRUBINUR, KETONESUR, PROTEINUR, UROBILINOGEN, NITRITE, LEUKOCYTESUR Sepsis Labs: @LABRCNTIP (procalcitonin:4,lacticidven:4)  )No results found for this or any previous visit (from the past 240 hour(s)).       Radiology Studies: Dg Chest 2 View  04/22/2016  CLINICAL DATA:  Chest pain since last night EXAM: CHEST  2 VIEW COMPARISON:  None. FINDINGS: Mild peribronchial thickening. Heart and  mediastinal contours are within normal limits. No focal opacities or effusions. No acute bony abnormality. IMPRESSION: Mild bronchitic changes. Electronically Signed   By: Charlett NoseKevin  Dover M.D.   On: 04/22/2016 15:25        Scheduled Meds: . DULoxetine  120 mg Oral Daily  . enoxaparin (LOVENOX) injection  1 mg/kg Subcutaneous Q12H  . losartan  100 mg Oral Daily   And  . hydrochlorothiazide  25 mg Oral Daily  . pantoprazole  40 mg Oral Daily  . potassium chloride  40 mEq Oral Once  . potassium chloride  10 mEq Intravenous Q1 Hr x 4  . potassium chloride  20 mEq Oral BID  . sodium chloride flush  3 mL Intravenous Q12H  . sucralfate  1 g Oral TID WC & HS   Continuous Infusions:       Time spent: 30 Mins   Berton MountSylvester Ogbata, MD  [facility] Pager #: [phone] 7PM-7AM contact night  coverage as above

## 2016-04-24 ENCOUNTER — Observation Stay (HOSPITAL_BASED_OUTPATIENT_CLINIC_OR_DEPARTMENT_OTHER): Payer: Managed Care, Other (non HMO)

## 2016-04-24 DIAGNOSIS — E876 Hypokalemia: Secondary | ICD-10-CM | POA: Diagnosis not present

## 2016-04-24 DIAGNOSIS — I4891 Unspecified atrial fibrillation: Secondary | ICD-10-CM | POA: Diagnosis not present

## 2016-04-24 DIAGNOSIS — I10 Essential (primary) hypertension: Secondary | ICD-10-CM | POA: Diagnosis not present

## 2016-04-24 DIAGNOSIS — R079 Chest pain, unspecified: Secondary | ICD-10-CM | POA: Diagnosis not present

## 2016-04-24 LAB — RENAL FUNCTION PANEL
Albumin: 3.6 g/dL (ref 3.5–5.0)
Anion gap: 4 — ABNORMAL LOW (ref 5–15)
BUN: 17 mg/dL (ref 6–20)
CO2: 27 mmol/L (ref 22–32)
Calcium: 9.2 mg/dL (ref 8.9–10.3)
Chloride: 110 mmol/L (ref 101–111)
Creatinine, Ser: 0.68 mg/dL (ref 0.44–1.00)
GFR calc Af Amer: >60 mL/min (ref >60)
GFR calc non Af Amer: >60 mL/min (ref >60)
Glucose, Bld: 80 mg/dL (ref 65–99)
Phosphorus: 2.8 mg/dL (ref 2.5–4.6)
Potassium: 4.3 mmol/L (ref 3.5–5.1)
Sodium: 141 mmol/L (ref 135–145)

## 2016-04-24 LAB — NM MYOCAR MULTI W/SPECT W/WALL MOTION / EF
Estimated workload: 1 METS
Exercise duration (min): 0 min
Exercise duration (sec): 0 s
MPHR: 162 {beats}/min
Peak HR: 90 {beats}/min
Percent HR: 55 %
Rest HR: 76 {beats}/min

## 2016-04-24 LAB — ECHOCARDIOGRAM COMPLETE
Height: 63 in
Weight: 4704 oz

## 2016-04-24 LAB — MRSA PCR SCREENING: MRSA by PCR: NEGATIVE

## 2016-04-24 MED ORDER — PERFLUTREN LIPID MICROSPHERE
1.0000 mL | INTRAVENOUS | Status: AC | PRN
  Administered 2016-04-24: 2 mL via INTRAVENOUS
  Filled 2016-04-24 (×2): qty 10

## 2016-04-24 MED ORDER — TECHNETIUM TC 99M TETROFOSMIN IV KIT
30.0000 | PACK | Freq: Once | INTRAVENOUS | Status: AC | PRN
  Administered 2016-04-24: 30 via INTRAVENOUS

## 2016-04-24 MED ORDER — REGADENOSON 0.4 MG/5ML IV SOLN
0.4000 mg | Freq: Once | INTRAVENOUS | Status: AC
Start: 2016-04-24 — End: 2016-04-24
  Administered 2016-04-24: 0.4 mg via INTRAVENOUS
  Filled 2016-04-24: qty 5

## 2016-04-24 MED ORDER — SUCRALFATE 1 GM/10ML PO SUSP: 1.0000 g | Freq: Three times a day (TID) | ORAL | Status: DC

## 2016-04-24 MED ORDER — REGADENOSON 0.4 MG/5ML IV SOLN
INTRAVENOUS | Status: AC
  Filled 2016-04-24: qty 5

## 2016-04-24 MED ORDER — PANTOPRAZOLE SODIUM 40 MG PO TBEC: 40.0000 mg | DELAYED_RELEASE_TABLET | Freq: Every day | ORAL | Status: DC

## 2016-04-24 MED ORDER — LOSARTAN POTASSIUM 100 MG PO TABS: 100.0000 mg | ORAL_TABLET | Freq: Every day | ORAL | Status: DC

## 2016-04-24 NOTE — Progress Notes (Addendum)
  Seen in Nuclear Medicine for 2-day NST. Resting images yesterday, stress today. [facility].  Signed, Ellsworth LennoxBrittany M Strader, PA-C 04/24/2016, 11:01 AM  Admitting team made aware of results below:   IMPRESSION: 1. No reversible ischemia or infarction.  2. Normal left ventricular wall motion.  3. Left ventricular ejection fraction 54%  4. Non invasive risk stratification*: Low  *2012 Appropriate Use Criteria for Coronary Revascularization Focused Update: J Am Coll Cardiol. 2012;59(9):857-881. Http://content.dementiazones.comonlinejacc.org/article.aspx?articleid=1201161  I attempted to contact the admitting provider, Dr. Dartha Lodgegbata, multiple times via Amion in regards to the patient's atrial fibrillation, for when I talked to the patient about her stress test results, she was asking about Lovenox injections at home (which she has been receiving here). I encouraged her to follow-up with her PCP within the next few days in regards to addressing the need for long-term anticoagulation since she is not being started on this at time of discharge.   Signed, Ellsworth LennoxBrittany M Strader, PA-C 04/24/2016, 4:17 PM Pager: [phone]

## 2016-04-24 NOTE — Progress Notes (Signed)
  Echocardiogram 2D Echocardiogram has been performed with definity.  [name] 04/24/2016, 8:47 AM

## 2016-04-24 NOTE — Discharge Summary (Signed)
Physician Discharge Summary  Patient ID: [name] MRN: [id number] Jericho9 DOB/AGE: [birth year] 58 y.o.  Admit date: 04/22/2016 Discharge date: 04/24/2016  Admission Diagnoses:  Discharge Diagnoses:  Principal Problem:   Chest pain Active Problems:   Hypokalemia   Morbid obesity (HCC)   Hypertension   Paroxysmal a-fib (HCC)   Hypomagnesemia   Discharged Condition: stable  Hospital Course: 60 year old female with medical history significant of hypertension, chronic back pain, anxiety, Atrial fibrillation 15 years ago with electrical cardioversion who presents to the ER with chest pain with typical and atypical features. Patient was admitted for further assessment and management. Cardiac enzymes were cycled and they came back negative. ECHO report is noted. Patient underwent 2 day cardiac stress test, and will be discharged back home today if the cardiac stress test is non revealing.  PCP may consider sending patient for sleep studies on discharge to rule out possible obstructive sleep apneia.  Hypokalemia was noted during the hospital stay, and replaced. HCTZ will be held. Patient's Blood pressure and electrolytes should be monitored closely by the PCP.  Consults: None  Significant Diagnostic Studies: nuclear medicine: Cardiac stress test. ECHO. Cardiac enzymes.  Discharge Medication - See Med Rec.  Discharge Exam: Blood pressure 118/95, pulse 79, temperature 97.8 F (36.6 C), temperature source Oral, resp. rate 22, height 5\' 3"  (1.6 m), weight 133.358 kg (294 lb), SpO2 99 %.   Disposition:   Discharge Instructions    Diet - low sodium heart healthy    Complete by:  As directed      Discharge instructions    Complete by:  As directed   Follow up with PCP in one week     Increase activity slowly    Complete by:  As directed             Medication List    STOP taking these medications        ibuprofen 200 MG tablet  Commonly known as:  ADVIL,MOTRIN     losartan-hydrochlorothiazide 100-25 MG tablet  Commonly known as:  HYZAAR      TAKE these medications        amLODipine 5 MG tablet  Commonly known as:  NORVASC  Take 1 tablet by mouth daily.     clonazePAM 0.5 MG tablet  Commonly known as:  KLONOPIN  Take 1 tablet by mouth 3 (three) times daily as needed. As needed for anxiety     DULoxetine 60 MG capsule  Commonly known as:  CYMBALTA  Take 2 capsules by mouth daily.     losartan 100 MG tablet  Commonly known as:  COZAAR  Take 1 tablet (100 mg total) by mouth daily.     pantoprazole 40 MG tablet  Commonly known as:  PROTONIX  Take 1 tablet (40 mg total) by mouth daily.     sucralfate 1 GM/10ML suspension  Commonly known as:  CARAFATE  Take 10 mLs (1 g total) by mouth 4 (four) times daily -  with meals and at bedtime.     traMADol 50 MG tablet  Commonly known as:  ULTRAM  Take 1 tablet by mouth 2 (two) times daily as needed.         SignedBarnetta Chapel: Sylvester I Ogbata 04/24/2016, 9:02 AM

## 2016-05-05 ENCOUNTER — Encounter: Payer: Self-pay | Admitting: Internal Medicine

## 2016-05-19 ENCOUNTER — Ambulatory Visit (INDEPENDENT_AMBULATORY_CARE_PROVIDER_SITE_OTHER): Payer: Managed Care, Other (non HMO) | Admitting: Internal Medicine

## 2016-05-19 ENCOUNTER — Encounter: Payer: Self-pay | Admitting: Internal Medicine

## 2016-05-19 VITALS — BP 110/82 | HR 82 | Ht 63.0 in | Wt 297.4 lb

## 2016-05-19 DIAGNOSIS — I4819 Other persistent atrial fibrillation: Secondary | ICD-10-CM

## 2016-05-19 DIAGNOSIS — I481 Persistent atrial fibrillation: Secondary | ICD-10-CM

## 2016-05-19 DIAGNOSIS — I10 Essential (primary) hypertension: Secondary | ICD-10-CM

## 2016-05-19 DIAGNOSIS — R0683 Snoring: Secondary | ICD-10-CM | POA: Diagnosis not present

## 2016-05-19 DIAGNOSIS — I4891 Unspecified atrial fibrillation: Secondary | ICD-10-CM

## 2016-05-19 NOTE — Progress Notes (Signed)
Electrophysiology Office Note   Date:  05/19/2016   ID:  [name], DOB 1955-12-30, MRN [id number]  PCP:  Cala BradfordWHITE,CYNTHIA S, MD  Cardiologist:  none Primary Electrophysiologist: Hillis RangeJames Allred, MD    Chief Complaint  Patient presents with  . Atrial Fibrillation     History of Present Illness: [name] is a 60 y.o. female who presents today for electrophysiology evaluation.   She reports having atrial fibrillation initially around 2003.  She was evaluated by Dr Jacinto HalimGanji and required cardioversion.   She is unaware of any further atrial fibrillation.  She did not require medicine for her afib.  She thinks that she had a reaction to inhaled carpet glue which caused the episode. She developed recurrent symptoms of a palpitations and chest discomfort 04/22/16.  [facility] and was found to have afib with RVR.  She was admitted to [facility] for further evaluation.  She was noted to have low potassium and this was repleted.  She was started on xarelto.  She was placed on protonix for "heart burn".   Echo in the hospital revealed EF 60-65%, moderate LA enlargement (43mm).  myoview revealed no ischemic or infarct.  She was discharged with outpatient follow-up planned. Her heart burn has resolved.  She has occasional heart "fluttering".  She has mild SOB with exertion.  She has chronic venous insufficiency and chronic edema.  She snores but has not had a sleep study.  Today, she denies symptoms of exertional chest pain, orthopnea, PND,  claudication, dizziness, presyncope, syncope, bleeding, or neurologic sequela. The patient is tolerating medications without difficulties and is otherwise without complaint today.    Past Medical History:  Diagnosis Date  . Atrial fibrillation (HCC)   . Chronic lower back pain   . Depression   . DVT (deep venous thrombosis) (HCC) 1990s   LLE  . Hypertension   . Hyperthyroidism ~ 2000   "fine now" (04/23/2016)  . Obesity   .  Pneumonia 1980s X 1  . Snoring    has not had sleep study but suspects sleep apnea   Past Surgical History:  Procedure Laterality Date  . ENDOMETRIAL ABLATION  ~2006  . TONSILLECTOMY  1960s  . TUBAL LIGATION  1979     Current Outpatient Prescriptions  Medication Sig Dispense Refill  . amLODipine (NORVASC) 5 MG tablet Take 1 tablet by mouth daily.  0  . clonazePAM (KLONOPIN) 0.5 MG tablet Take 1 tablet by mouth 3 (three) times daily as needed for anxiety.   0  . DULoxetine (CYMBALTA) 60 MG capsule Take 2 capsules by mouth daily.  0  . losartan (COZAAR) 100 MG tablet Take 1 tablet (100 mg total) by mouth daily. 30 tablet 1  . pantoprazole (PROTONIX) 40 MG tablet Take 1 tablet (40 mg total) by mouth daily. 30 tablet 1  . XARELTO 20 MG TABS tablet Take 1 tablet by mouth daily.    . sucralfate (CARAFATE) 1 GM/10ML suspension Take 10 mLs (1 g total) by mouth 4 (four) times daily -  with meals and at bedtime. (Patient not taking: Reported on 05/19/2016) 420 mL 1  . traMADol (ULTRAM) 50 MG tablet Take 1 tablet by mouth 2 (two) times daily as needed (pain).   0   No current facility-administered medications for this visit.     Allergies:   Penicillins and Codeine   Social History:  The patient  reports that she has quit smoking. Her smoking use  included Cigarettes. She has a 7.50 pack-year smoking history. She has never used smokeless tobacco. She reports that she drinks about 2.4 oz of alcohol per week . She reports that she uses drugs, including Marijuana.   Family History:  The patient's  family history includes Arthritis in her mother; CAD in her maternal grandfather; CVA in her father; Diabetes Mellitus I in her father; Obesity in her mother.    ROS:  Please see the history of present illness.   All other systems are reviewed and negative.    PHYSICAL EXAM: VS:  BP 110/82   Pulse 82   Ht 5\' 3"  (1.6 m)   Wt 297 lb 6.4 oz (134.9 kg)   BMI 52.68 kg/m  , BMI Body mass index is 52.68  kg/m. GEN: overweight, in no acute distress  HEENT: normal  Neck: no JVD, carotid bruits, or masses Cardiac: iRRR; no murmurs, rubs, or gallops,+2 edema with venous deformity noted Respiratory:  clear to auscultation bilaterally, normal work of breathing GI: soft, nontender, nondistended, + BS MS: L foot/ ankle in a brace  Skin: warm and dry  Neuro:  Strength and sensation are intact Psych: euthymic mood, full affect  EKG:  EKG is ordered today. The ekg ordered today shows afib, V rate 82 bpm, nonspecific ST/T changes   Recent Labs: 04/22/2016: Magnesium 1.6; TSH 3.873 04/23/2016: ALT 19; Hemoglobin 14.2; Platelets 220 04/24/2016: BUN 17; Creatinine, Ser 0.68; Potassium 4.3; Sodium 141    Lipid Panel  No results found for: CHOL, TRIG, HDL, CHOLHDL, VLDL, LDLCALC, LDLDIRECT   Wt Readings from Last 3 Encounters:  05/19/16 297 lb 6.4 oz (134.9 kg)  04/22/16 294 lb (133.4 kg)     ASSESSMENT AND PLAN:  1.  Persistent atrial fibrillation The patient appears to have rate controlled afib. I believe that she is mildly symptomatic.  Her chads2vasc score is 2.  She is tolerating xarelto without issues.  She started this 05/02/16.   I have discussed risks and benefits of cardioversion with the patient who wishes to proceed.  I would not initiate AAD therapy unless she fails to maintain sinus rhythm.  Importance of lifestyle modification discussed.  2. Obesity Body mass index is 52.68 kg/m. Weight reduction and lifestyle modification encouraged  3. HTn Stable No change required today  4. Snoring Sleep study ordered   Follow-up:  In AF clinic 4 weeks after cardioversion  Current medicines are reviewed at length with the patient today.   The patient does not have concerns regarding her medicines.  The following changes were made today:  none    Signed, Hillis Range, MD  05/19/2016 9:53 AM     [facility] [address] [place]  [address] [phone] (office) [phone] (fax)

## 2016-05-19 NOTE — Patient Instructions (Addendum)
Medication Instructions:  Your physician recommends that you continue on your current medications as directed. Please refer to the Current Medication list given to you today.  Labwork: Your physician recommends that you return for lab work on 06/11/16 any time for labs--do not have to be fasting   Testing/Procedures: Your physician has recommended that you have a Cardioversion (DCCV). Electrical Cardioversion uses a jolt of electricity to your heart either through paddles or wired patches attached to your chest. This is a controlled, usually prescheduled, procedure. Defibrillation is done under light anesthesia in the hospital, and you usually go home the day of the procedure. This is done to get your heart back into a normal rhythm. You are not awake for the procedure. Please see the instruction sheet given to you today.---06/18/16  Please arrive at [facility] Entrance of [facility] 11:30am Do not eat or drink after Midnight the night prior to the procedure Okay to take mediations the morning of the procedure with small sip of water DO NOT MISS ANY DOSES OF XARELTO     Follow-Up:  You have been referred to Dr Earl Gala for sleep study    Your physician recommends that you schedule a follow-up appointment in: 6 weeks with Rudi Coco, PA   Any Other Special Instructions Will Be Listed Below (If Applicable).     If you need a refill on your cardiac medications before your next appointment, please call your pharmacy.

## 2016-06-11 ENCOUNTER — Other Ambulatory Visit: Payer: Managed Care, Other (non HMO)

## 2016-06-13 ENCOUNTER — Encounter (INDEPENDENT_AMBULATORY_CARE_PROVIDER_SITE_OTHER): Payer: Self-pay

## 2016-06-13 ENCOUNTER — Other Ambulatory Visit: Payer: Managed Care, Other (non HMO) | Admitting: *Deleted

## 2016-06-13 DIAGNOSIS — I4819 Other persistent atrial fibrillation: Secondary | ICD-10-CM

## 2016-06-13 LAB — CBC WITH DIFFERENTIAL/PLATELET
Basophils Absolute: 66 cells/uL (ref 0–200)
Basophils Relative: 1 %
Eosinophils Absolute: 396 cells/uL (ref 15–500)
Eosinophils Relative: 6 %
HCT: 40.7 % (ref 35.0–45.0)
Hemoglobin: 13.8 g/dL (ref 11.7–15.5)
Lymphocytes Relative: 31 %
Lymphs Abs: 2046 cells/uL (ref 850–3900)
MCH: 28.4 pg (ref 27.0–33.0)
MCHC: 33.9 g/dL (ref 32.0–36.0)
MCV: 83.7 fL (ref 80.0–100.0)
MPV: 10.7 fL (ref 7.5–12.5)
Monocytes Absolute: 528 cells/uL (ref 200–950)
Monocytes Relative: 8 %
Neutro Abs: 3564 cells/uL (ref 1500–7800)
Neutrophils Relative %: 54 %
Platelets: 263 10*3/uL (ref 140–400)
RBC: 4.86 MIL/uL (ref 3.80–5.10)
RDW: 14 % (ref 11.0–15.0)
WBC: 6.6 10*3/uL (ref 3.8–10.8)

## 2016-06-13 LAB — BASIC METABOLIC PANEL
BUN: 21 mg/dL (ref 7–25)
CO2: 21 mmol/L (ref 20–31)
Calcium: 9 mg/dL (ref 8.6–10.4)
Chloride: 104 mmol/L (ref 98–110)
Creat: 0.66 mg/dL (ref 0.50–1.05)
Glucose, Bld: 74 mg/dL (ref 65–99)
Potassium: 4.7 mmol/L (ref 3.5–5.3)
Sodium: 139 mmol/L (ref 135–146)

## 2016-06-18 ENCOUNTER — Encounter (HOSPITAL_COMMUNITY): Payer: Self-pay | Admitting: *Deleted

## 2016-06-18 ENCOUNTER — Ambulatory Visit (HOSPITAL_COMMUNITY): Payer: Managed Care, Other (non HMO) | Admitting: Certified Registered Nurse Anesthetist

## 2016-06-18 ENCOUNTER — Encounter (HOSPITAL_COMMUNITY)
Admission: RE | Disposition: A | Discharge status: Home / Self Care | Payer: Self-pay | Source: Ambulatory Visit | Attending: Cardiology

## 2016-06-18 ENCOUNTER — Ambulatory Visit (HOSPITAL_COMMUNITY)
Admission: RE | Admit: 2016-06-18 | Discharge: 2016-06-18 | Disposition: A | Discharge status: Home / Self Care | Payer: Managed Care, Other (non HMO) | Source: Ambulatory Visit | Attending: Cardiology | Admitting: Cardiology

## 2016-06-18 DIAGNOSIS — I481 Persistent atrial fibrillation: Secondary | ICD-10-CM | POA: Insufficient documentation

## 2016-06-18 DIAGNOSIS — K219 Gastro-esophageal reflux disease without esophagitis: Secondary | ICD-10-CM | POA: Insufficient documentation

## 2016-06-18 DIAGNOSIS — Z6841 Body Mass Index (BMI) 40.0 and over, adult: Secondary | ICD-10-CM | POA: Insufficient documentation

## 2016-06-18 DIAGNOSIS — I10 Essential (primary) hypertension: Secondary | ICD-10-CM | POA: Diagnosis not present

## 2016-06-18 DIAGNOSIS — F329 Major depressive disorder, single episode, unspecified: Secondary | ICD-10-CM | POA: Diagnosis not present

## 2016-06-18 DIAGNOSIS — Z87891 Personal history of nicotine dependence: Secondary | ICD-10-CM | POA: Diagnosis not present

## 2016-06-18 DIAGNOSIS — Z86718 Personal history of other venous thrombosis and embolism: Secondary | ICD-10-CM | POA: Insufficient documentation

## 2016-06-18 DIAGNOSIS — Z79899 Other long term (current) drug therapy: Secondary | ICD-10-CM | POA: Insufficient documentation

## 2016-06-18 DIAGNOSIS — I4891 Unspecified atrial fibrillation: Secondary | ICD-10-CM | POA: Diagnosis not present

## 2016-06-18 DIAGNOSIS — E669 Obesity, unspecified: Secondary | ICD-10-CM | POA: Insufficient documentation

## 2016-06-18 DIAGNOSIS — Z7901 Long term (current) use of anticoagulants: Secondary | ICD-10-CM | POA: Diagnosis not present

## 2016-06-18 HISTORY — PX: CARDIOVERSION: SHX1299

## 2016-06-18 SURGERY — CARDIOVERSION
Anesthesia: Monitor Anesthesia Care

## 2016-06-18 MED ORDER — SODIUM CHLORIDE 0.9 % IV SOLN
INTRAVENOUS | Status: DC
  Administered 2016-06-18 (×2): via INTRAVENOUS

## 2016-06-18 MED ORDER — LIDOCAINE HCL (CARDIAC) 20 MG/ML IV SOLN
INTRAVENOUS | Status: DC | PRN
  Administered 2016-06-18: 60 mg via INTRATRACHEAL

## 2016-06-18 MED ORDER — PROPOFOL 10 MG/ML IV BOLUS
INTRAVENOUS | Status: DC | PRN
  Administered 2016-06-18: 50 mg via INTRAVENOUS

## 2016-06-18 NOTE — Discharge Instructions (Signed)
Electrical Cardioversion, Care After °Refer to this sheet in the next few weeks. These instructions provide you with information on caring for yourself after your procedure. Your health care provider may also give you more specific instructions. Your treatment has been planned according to current medical practices, but problems sometimes occur. Call your health care provider if you have any problems or questions after your procedure. °WHAT TO EXPECT AFTER THE PROCEDURE °After your procedure, it is typical to have the following sensations: °· Some redness on the skin where the shocks were delivered. If this is tender, a sunburn lotion or hydrocortisone cream may help. °· Possible return of an abnormal heart rhythm within hours or days after the procedure. °HOME CARE INSTRUCTIONS °· Take medicines only as directed by your health care provider. Be sure you understand how and when to take your medicine. °· Learn how to feel your pulse and check it often. °· Limit your activity for 48 hours after the procedure or as directed by your health care provider. °· Avoid or minimize caffeine and other stimulants as directed by your health care provider. °SEEK MEDICAL CARE IF: °· You feel like your heart is beating too fast or your pulse is not regular. °· You have any questions about your medicines. °· You have bleeding that will not stop. °SEEK IMMEDIATE MEDICAL CARE IF: °· You are dizzy or feel faint. °· It is hard to breathe or you feel short of breath. °· There is a change in discomfort in your chest. °· Your speech is slurred or you have trouble moving an arm or leg on one side of your body. °· You get a serious muscle cramp that does not go away. °· Your fingers or toes turn cold or blue. °  °This information is not intended to replace advice given to you by your health care provider. Make sure you discuss any questions you have with your health care provider. °  °Document Released: 08/03/2013 Document Revised: 11/03/2014  Document Reviewed: 08/03/2013 °Elsevier Interactive Patient Education ©2016 [employer] Inc[employer] ° °

## 2016-06-18 NOTE — H&P (View-Only) (Signed)
Electrophysiology Office Note   Date:  05/19/2016   ID:  [name], DOB 1955-12-30, MRN [id number]  PCP:  Cala BradfordWHITE,CYNTHIA S, MD  Cardiologist:  none Primary Electrophysiologist: Hillis RangeJames Allred, MD    Chief Complaint  Patient presents with  . Atrial Fibrillation     History of Present Illness: [name] is a 60 y.o. female who presents today for electrophysiology evaluation.   She reports having atrial fibrillation initially around 2003.  She was evaluated by Dr Jacinto HalimGanji and required cardioversion.   She is unaware of any further atrial fibrillation.  She did not require medicine for her afib.  She thinks that she had a reaction to inhaled carpet glue which caused the episode. She developed recurrent symptoms of a palpitations and chest discomfort 04/22/16.  [facility] and was found to have afib with RVR.  She was admitted to [facility] for further evaluation.  She was noted to have low potassium and this was repleted.  She was started on xarelto.  She was placed on protonix for "heart burn".   Echo in the hospital revealed EF 60-65%, moderate LA enlargement (43mm).  myoview revealed no ischemic or infarct.  She was discharged with outpatient follow-up planned. Her heart burn has resolved.  She has occasional heart "fluttering".  She has mild SOB with exertion.  She has chronic venous insufficiency and chronic edema.  She snores but has not had a sleep study.  Today, she denies symptoms of exertional chest pain, orthopnea, PND,  claudication, dizziness, presyncope, syncope, bleeding, or neurologic sequela. The patient is tolerating medications without difficulties and is otherwise without complaint today.    Past Medical History:  Diagnosis Date  . Atrial fibrillation (HCC)   . Chronic lower back pain   . Depression   . DVT (deep venous thrombosis) (HCC) 1990s   LLE  . Hypertension   . Hyperthyroidism ~ 2000   "fine now" (04/23/2016)  . Obesity   .  Pneumonia 1980s X 1  . Snoring    has not had sleep study but suspects sleep apnea   Past Surgical History:  Procedure Laterality Date  . ENDOMETRIAL ABLATION  ~2006  . TONSILLECTOMY  1960s  . TUBAL LIGATION  1979     Current Outpatient Prescriptions  Medication Sig Dispense Refill  . amLODipine (NORVASC) 5 MG tablet Take 1 tablet by mouth daily.  0  . clonazePAM (KLONOPIN) 0.5 MG tablet Take 1 tablet by mouth 3 (three) times daily as needed for anxiety.   0  . DULoxetine (CYMBALTA) 60 MG capsule Take 2 capsules by mouth daily.  0  . losartan (COZAAR) 100 MG tablet Take 1 tablet (100 mg total) by mouth daily. 30 tablet 1  . pantoprazole (PROTONIX) 40 MG tablet Take 1 tablet (40 mg total) by mouth daily. 30 tablet 1  . XARELTO 20 MG TABS tablet Take 1 tablet by mouth daily.    . sucralfate (CARAFATE) 1 GM/10ML suspension Take 10 mLs (1 g total) by mouth 4 (four) times daily -  with meals and at bedtime. (Patient not taking: Reported on 05/19/2016) 420 mL 1  . traMADol (ULTRAM) 50 MG tablet Take 1 tablet by mouth 2 (two) times daily as needed (pain).   0   No current facility-administered medications for this visit.     Allergies:   Penicillins and Codeine   Social History:  The patient  reports that she has quit smoking. Her smoking use  included Cigarettes. She has a 7.50 pack-year smoking history. She has never used smokeless tobacco. She reports that she drinks about 2.4 oz of alcohol per week . She reports that she uses drugs, including Marijuana.   Family History:  The patient's  family history includes Arthritis in her mother; CAD in her maternal grandfather; CVA in her father; Diabetes Mellitus I in her father; Obesity in her mother.    ROS:  Please see the history of present illness.   All other systems are reviewed and negative.    PHYSICAL EXAM: VS:  BP 110/82   Pulse 82   Ht 5' 3" (1.6 m)   Wt 297 lb 6.4 oz (134.9 kg)   BMI 52.68 kg/m  , BMI Body mass index is 52.68  kg/m. GEN: overweight, in no acute distress  HEENT: normal  Neck: no JVD, carotid bruits, or masses Cardiac: iRRR; no murmurs, rubs, or gallops,+2 edema with venous deformity noted Respiratory:  clear to auscultation bilaterally, normal work of breathing GI: soft, nontender, nondistended, + BS MS: L foot/ ankle in a brace  Skin: warm and dry  Neuro:  Strength and sensation are intact Psych: euthymic mood, full affect  EKG:  EKG is ordered today. The ekg ordered today shows afib, V rate 82 bpm, nonspecific ST/T changes   Recent Labs: 04/22/2016: Magnesium 1.6; TSH 3.873 04/23/2016: ALT 19; Hemoglobin 14.2; Platelets 220 04/24/2016: BUN 17; Creatinine, Ser 0.68; Potassium 4.3; Sodium 141    Lipid Panel  No results found for: CHOL, TRIG, HDL, CHOLHDL, VLDL, LDLCALC, LDLDIRECT   Wt Readings from Last 3 Encounters:  05/19/16 297 lb 6.4 oz (134.9 kg)  04/22/16 294 lb (133.4 kg)     ASSESSMENT AND PLAN:  1.  Persistent atrial fibrillation The patient appears to have rate controlled afib. I believe that she is mildly symptomatic.  Her chads2vasc score is 2.  She is tolerating xarelto without issues.  She started this 05/02/16.   I have discussed risks and benefits of cardioversion with the patient who wishes to proceed.  I would not initiate AAD therapy unless she fails to maintain sinus rhythm.  Importance of lifestyle modification discussed.  2. Obesity Body mass index is 52.68 kg/m. Weight reduction and lifestyle modification encouraged  3. HTn Stable No change required today  4. Snoring Sleep study ordered   Follow-up:  In AF clinic 4 weeks after cardioversion  Current medicines are reviewed at length with the patient today.   The patient does not have concerns regarding her medicines.  The following changes were made today:  none    Signed, James Allred, MD  05/19/2016 9:53 AM     [facility] [address] [place]  [address] [phone] (office) [phone] (fax) 

## 2016-06-18 NOTE — Procedures (Signed)
Electrical Cardioversion Procedure Note Ricardo Jerichoamela S Litsey [id number] Apr 03, 1956  Procedure: Electrical Cardioversion Indications:  Atrial Fibrillation.  She has been on Xarelto without missing doses.   Procedure Details Consent: Risks of procedure as well as the alternatives and risks of each were explained to the (patient/caregiver).  Consent for procedure obtained. Time Out: Verified patient identification, verified procedure, site/side was marked, verified correct patient position, special equipment/implants available, medications/allergies/relevent history reviewed, required imaging and test results available.  Performed  Patient placed on cardiac monitor, pulse oximetry, supplemental oxygen as necessary.  Sedation given: Per anesthesiology Pacer pads placed anterior and posterior chest.  Cardioverted 1 time(s).  Cardioverted at 200J.  Evaluation Findings: Post procedure EKG shows: NSR Complications: None Patient did tolerate procedure well.   Marca AnconaDalton McLean 06/18/2016, 1:39 PM

## 2016-06-18 NOTE — Transfer of Care (Signed)
Immediate Anesthesia Transfer of Care Note  Patient: Abigail JerichoPamela S Koch  Procedure(s) Performed: Procedure(s): CARDIOVERSION (N/A)  Patient Location: PACU  Anesthesia Type:General  Level of Consciousness: awake, alert , oriented and patient cooperative  Airway & Oxygen Therapy: Patient Spontanous Breathing and Patient connected to nasal cannula oxygen  Post-op Assessment: Report given to RN and Post -op Vital signs reviewed and stable  Post vital signs: Reviewed and stable  Last Vitals:  Vitals:   06/18/16 1207 06/18/16 1345  BP: (!) 142/83 126/89  Pulse: 78 63  Resp: (!) 21 20  Temp: 37 C     Last Pain:  Vitals:   06/18/16 1345  TempSrc: Oral         Complications: No apparent anesthesia complications

## 2016-06-18 NOTE — Anesthesia Postprocedure Evaluation (Signed)
Anesthesia Post Note  Patient: [name]  Procedure(s) Performed: Procedure(s) (LRB): CARDIOVERSION (N/A)  Patient location during evaluation: PACU Anesthesia Type: MAC Level of consciousness: awake and alert Pain management: pain level controlled Vital Signs Assessment: post-procedure vital signs reviewed and stable Respiratory status: spontaneous breathing, nonlabored ventilation, respiratory function stable and patient connected to nasal cannula oxygen Cardiovascular status: stable and blood pressure returned to baseline Anesthetic complications: no    Last Vitals:  Vitals:   06/18/16 1345 06/18/16 1350  BP: 126/89 137/75  Pulse: 63 61  Resp: 20 18  Temp:      Last Pain:  Vitals:   06/18/16 1345  TempSrc: Oral                 Shelton SilvasKevin D Hollis

## 2016-06-18 NOTE — Interval H&P Note (Signed)
History and Physical Interval Note:  06/18/2016 1:35 PM  [name]  has presented today for surgery, with the diagnosis of afib  The various methods of treatment have been discussed with the patient and family. After consideration of risks, benefits and other options for treatment, the patient has consented to  Procedure(s): CARDIOVERSION (N/A) as a surgical intervention .  The patient's history has been reviewed, patient examined, no change in status, stable for surgery.  I have reviewed the patient's chart and labs.  Questions were answered to the patient's satisfaction.     Dalton Chesapeake EnergyMcLean

## 2016-06-18 NOTE — Anesthesia Preprocedure Evaluation (Addendum)
Anesthesia Evaluation  Patient identified by MRN, date of birth, ID band Patient awake    Reviewed: Allergy & Precautions, NPO status , Patient's Chart, lab work & pertinent test results  Airway Mallampati: III  TM Distance: >3 FB Neck ROM: Full    Dental  (+) Teeth Intact, Dental Advisory Given   Pulmonary pneumonia, former smoker,    breath sounds clear to auscultation       Cardiovascular hypertension, Pt. on medications + dysrhythmias Atrial Fibrillation  Rhythm:Irregular Rate:Abnormal     Neuro/Psych PSYCHIATRIC DISORDERS Depression    GI/Hepatic Neg liver ROS, GERD  Medicated,  Endo/Other  Hyperthyroidism   Renal/GU negative Renal ROS  negative genitourinary   Musculoskeletal negative musculoskeletal ROS (+)   Abdominal   Peds negative pediatric ROS (+)  Hematology negative hematology ROS (+)   Anesthesia Other Findings   Reproductive/Obstetrics negative OB ROS                            Anesthesia Physical Anesthesia Plan  ASA: III  Anesthesia Plan: MAC   Post-op Pain Management:    Induction: Intravenous  Airway Management Planned: Natural Airway  Additional Equipment:   Intra-op Plan:   Post-operative Plan:   Informed Consent:   Dental advisory given  Plan Discussed with: CRNA  Anesthesia Plan Comments:         Anesthesia Quick Evaluation

## 2016-06-19 ENCOUNTER — Encounter (HOSPITAL_COMMUNITY): Payer: Self-pay | Admitting: Cardiology

## 2016-07-02 ENCOUNTER — Ambulatory Visit (HOSPITAL_COMMUNITY)
Admission: RE | Admit: 2016-07-02 | Discharge: 2016-07-02 | Disposition: A | Discharge status: Home / Self Care | Payer: Managed Care, Other (non HMO) | Source: Ambulatory Visit | Attending: Nurse Practitioner | Admitting: Nurse Practitioner

## 2016-07-02 ENCOUNTER — Encounter (HOSPITAL_COMMUNITY): Payer: Self-pay | Admitting: Nurse Practitioner

## 2016-07-02 VITALS — BP 118/78 | HR 89 | Ht 63.0 in | Wt 303.0 lb

## 2016-07-02 DIAGNOSIS — Z87891 Personal history of nicotine dependence: Secondary | ICD-10-CM | POA: Insufficient documentation

## 2016-07-02 DIAGNOSIS — I481 Persistent atrial fibrillation: Secondary | ICD-10-CM

## 2016-07-02 DIAGNOSIS — F329 Major depressive disorder, single episode, unspecified: Secondary | ICD-10-CM | POA: Diagnosis not present

## 2016-07-02 DIAGNOSIS — Z86718 Personal history of other venous thrombosis and embolism: Secondary | ICD-10-CM | POA: Insufficient documentation

## 2016-07-02 DIAGNOSIS — Z7901 Long term (current) use of anticoagulants: Secondary | ICD-10-CM | POA: Insufficient documentation

## 2016-07-02 DIAGNOSIS — I10 Essential (primary) hypertension: Secondary | ICD-10-CM | POA: Insufficient documentation

## 2016-07-02 DIAGNOSIS — I480 Paroxysmal atrial fibrillation: Secondary | ICD-10-CM | POA: Diagnosis present

## 2016-07-02 DIAGNOSIS — E669 Obesity, unspecified: Secondary | ICD-10-CM | POA: Insufficient documentation

## 2016-07-02 DIAGNOSIS — Z880 Allergy status to penicillin: Secondary | ICD-10-CM | POA: Insufficient documentation

## 2016-07-02 DIAGNOSIS — I4819 Other persistent atrial fibrillation: Secondary | ICD-10-CM

## 2016-07-03 NOTE — Progress Notes (Addendum)
Primary Care Physician: Cala BradfordWHITE,CYNTHIA S, MD Referring Physician: Dr. Royden PurlAllred   [name] is a 60 y.o. female with a h/o PAF, obesity, HTN, ambulatory issues due to severe fallen arch of left arch, in afib clinic today for f/u cardioversion set up by Dr. Johney FrameAllred. Did shockl out but felt felt like she returned to afib 1-2 days ago. Has a feeling of uneasiness in her chest when in afib. Is sedentary due to ambulatory issues and does not really notice shortness of breath. Contributes fatigue to rasing three grandchildren. She would prefer to be in SR. On xarelto for chadsvasc score of 2. Is pending a sleep study for h/o snoring. Drinks 3-4 drinks of wine a week. No excessive caffeine. No smoking.  Today, she denies symptoms of palpitations, chest pain, shortness of breath, orthopnea, PND, lower extremity edema, dizziness, presyncope, syncope, or neurologic sequela. The patient is tolerating medications without difficulties and is otherwise without complaint today.   Past Medical History:  Diagnosis Date  . Atrial fibrillation (HCC)   . Chronic lower back pain   . Depression   . DVT (deep venous thrombosis) (HCC) 1990s   LLE  . Hypertension   . Hyperthyroidism ~ 2000   "fine now" (04/23/2016)  . Obesity   . Pneumonia 1980s X 1  . Snoring    has not had sleep study but suspects sleep apnea   Past Surgical History:  Procedure Laterality Date  . CARDIOVERSION N/A 06/18/2016   Procedure: CARDIOVERSION;  Surgeon: Laurey Moralealton S McLean, MD;  [facility];  Service: Cardiovascular;  Laterality: N/A;  . ENDOMETRIAL ABLATION  ~2006  . TONSILLECTOMY  1960s  . TUBAL LIGATION  1979    Current Outpatient Prescriptions  Medication Sig Dispense Refill  . amLODipine (NORVASC) 5 MG tablet Take 1 tablet by mouth daily.  0  . clonazePAM (KLONOPIN) 0.5 MG tablet Take 1 tablet by mouth 3 (three) times daily as needed for anxiety.   0  . cyanocobalamin 500 MCG tablet Take 500 mcg by mouth  daily.    . DULoxetine (CYMBALTA) 60 MG capsule Take 2 capsules by mouth daily.  0  . ibuprofen (ADVIL,MOTRIN) 200 MG tablet Take 200 mg by mouth as needed.    Marland Kitchen. losartan (COZAAR) 100 MG tablet Take 1 tablet (100 mg total) by mouth daily. 30 tablet 1  . pantoprazole (PROTONIX) 40 MG tablet Take 1 tablet (40 mg total) by mouth daily. 30 tablet 1  . traMADol (ULTRAM) 50 MG tablet Take 1 tablet by mouth 2 (two) times daily as needed (pain).   0  . XARELTO 20 MG TABS tablet Take 1 tablet by mouth daily.    . sucralfate (CARAFATE) 1 GM/10ML suspension Take 10 mLs (1 g total) by mouth 4 (four) times daily -  with meals and at bedtime. (Patient not taking: Reported on 07/02/2016) 420 mL 1   No current facility-administered medications for this encounter.     Allergies  Allergen Reactions  . Penicillins Hives and Shortness Of Breath  . Codeine Hives    Social History   Social History  . Marital status: Married    Spouse name: TERRY  . Number of children: 2  . Years of education: N/A   Occupational History  . [employer] OFFICES    Social History Main Topics  . Smoking status: Former Smoker    Packs/day: 0.75    Years: 10.00    Types: Cigarettes  . Smokeless tobacco: Never Used  Comment: 04/23/2016 "stopped smoking cigarettes in ~ 1989"  . Alcohol use 2.4 oz/week    4 Glasses of wine per week     Comment: recently quit  . Drug use:     Types: Marijuana     Comment: 04/23/2016 "recreational marijuana in my late teens/early 20's"  . Sexual activity: Not Currently   Other Topics Concern  . Not on file   Social History Narrative   Lives in [place] with husband and 3 grandchildren.   Self employed office cleaner.    Family History  Problem Relation Age of Onset  . Diabetes Mellitus I Father   . CVA Father   . Arthritis Mother   . Obesity Mother   . CAD Maternal Grandfather     ROS- All systems are reviewed and negative except as per the HPI above  Physical Exam: Vitals:    07/02/16 1021  BP: 118/78  Pulse: 89  Weight: (!) 303 lb (137.4 kg)  Height: 5\' 3"  (1.6 m)    GEN- The patient is well appearing, alert and oriented x 3 today.   Head- normocephalic, atraumatic Eyes-  Sclera clear, conjunctiva pink Ears- hearing intact Oropharynx- clear Neck- supple, no JVP Lymph- no cervical lymphadenopathy Lungs- Clear to ausculation bilaterally, normal work of breathing Heart- irregular rate and rhythm, no murmurs, rubs or gallops, PMI not laterally displaced GI- soft, NT, ND, + BS Extremities- no clubbing, cyanosis, or edema MS- no significant deformity or atrophy Skin- no rash or lesion Psych- euthymic mood, full affect Neuro- strength and sensation are intact  EKG- afib at 89 bpm, qrs int 88 ms, qtc 428 ms Epic records reviewed Echo-Technically difficult study, Definity contrast administered. LVEF   60-65%, grossly normal wall motion, normal wall thickness, aortic   sclerosis with trivial AI, moderate LAE, mild RAE, mild RVE with   mildly reduced RV function, normal IVC. Pharmacologic 2 day  myoview-  Perfusion: No decreased activity in the left ventricle on stress imaging to suggest reversible ischemia or infarction. Mild breast attenuation of the anterior wall.  Wall Motion: Normal left ventricular wall motion. No left ventricular dilation.  Left Ventricular Ejection Fraction: 54 %  End diastolic volume 114 ml  End systolic volume 52 ml  IMPRESSION: 1. No reversible ischemia or infarction.  2. Normal left ventricular wall motion.  3. Left ventricular ejection fraction 54%  4. Non invasive risk stratification*: Low  EKG- in SR, after cardioversion, shows SR with first degree AVB with pr interval of 212 ms  Creatinine-0.68 ms, K+4.3, NA 141  Assessment and Plan:  1. PAF Successful cardioversion but ERAF Discussion of afib and lifestyle issues, management  Pt would like to pursue antiarrythmic Discussed options, I think  Multaq or flecainide would be best choice I will discuss with Dr. Johney Frame and then proceed appropriately  2. Obseity Offered free diet class and pt is interested Difficulty with exercise due to foot issues that she plans to have surgery in the next 1-2 years  3. Snoring Pending sleep study  4. HTN Well managed  F/u per plan after discussion with Dr. Nehemiah Settle C. Matthew Folks [facility] [address] [place], [place] [address] [phone]   9/11- Addendum- Dr. Johney Frame agreed with either multaq or flecainide. First degree block at baseline and flecainide could lengthen. I discussed with pt use of multaq or flecainide and she would like to use multaq. Qtc reviewed in SR and not prolonged. Instructed that she will  start drug bid with food and made aware that sometimes mild GI issues may occur like mild nausea or diarrhea but food usually blunts the response. If she has symptoms usually abate after a few weeks. She will then be cardioverted 3 days later. F/u one week here after cardioversion

## 2016-07-07 ENCOUNTER — Ambulatory Visit (HOSPITAL_COMMUNITY)
Admission: RE | Admit: 2016-07-07 | Discharge: 2016-07-07 | Disposition: A | Discharge status: Home / Self Care | Payer: Managed Care, Other (non HMO) | Source: Ambulatory Visit | Attending: Nurse Practitioner | Admitting: Nurse Practitioner

## 2016-07-07 DIAGNOSIS — I480 Paroxysmal atrial fibrillation: Secondary | ICD-10-CM | POA: Diagnosis not present

## 2016-07-07 DIAGNOSIS — I481 Persistent atrial fibrillation: Secondary | ICD-10-CM | POA: Insufficient documentation

## 2016-07-07 LAB — CBC
HCT: 44.6 % (ref 36.0–46.0)
Hemoglobin: 14.5 g/dL (ref 12.0–15.0)
MCH: 27.9 pg (ref 26.0–34.0)
MCHC: 32.5 g/dL (ref 30.0–36.0)
MCV: 85.8 fL (ref 78.0–100.0)
Platelets: 298 10*3/uL (ref 150–400)
RBC: 5.2 MIL/uL — ABNORMAL HIGH (ref 3.87–5.11)
RDW: 13.3 % (ref 11.5–15.5)
WBC: 6.5 10*3/uL (ref 4.0–10.5)

## 2016-07-07 LAB — BASIC METABOLIC PANEL
Anion gap: 6 (ref 5–15)
BUN: 18 mg/dL (ref 6–20)
CO2: 27 mmol/L (ref 22–32)
Calcium: 9.2 mg/dL (ref 8.9–10.3)
Chloride: 103 mmol/L (ref 101–111)
Creatinine, Ser: 0.74 mg/dL (ref 0.44–1.00)
GFR calc Af Amer: >60 mL/min (ref >60)
GFR calc non Af Amer: >60 mL/min (ref >60)
Glucose, Bld: 60 mg/dL — ABNORMAL LOW (ref 65–99)
Potassium: 4.2 mmol/L (ref 3.5–5.1)
Sodium: 136 mmol/L (ref 135–145)

## 2016-07-07 NOTE — Patient Instructions (Signed)
Cardioversion scheduled for Friday, September 15th  - Arrive at the [facility] and go to admitting at 1230pm  -Do not eat or drink anything after midnight the night prior to your procedure.  - Take all your medication with a sip of water prior to arrival.  - You will not be able to drive home after your procedure.

## 2016-07-08 ENCOUNTER — Other Ambulatory Visit (HOSPITAL_COMMUNITY): Payer: Self-pay | Admitting: *Deleted

## 2016-07-08 MED ORDER — DRONEDARONE HCL 400 MG PO TABS: 400.0000 mg | ORAL_TABLET | Freq: Two times a day (BID) | ORAL | 6 refills | Status: DC

## 2016-07-08 NOTE — Addendum Note (Signed)
Encounter addended by: Newman Niponna C Carroll, NP on: 07/08/2016  8:40 AM<BR>    Actions taken: Sign clinical note

## 2016-07-11 ENCOUNTER — Ambulatory Visit (HOSPITAL_COMMUNITY): Payer: Managed Care, Other (non HMO) | Admitting: Anesthesiology

## 2016-07-11 ENCOUNTER — Ambulatory Visit (HOSPITAL_COMMUNITY)
Admission: RE | Admit: 2016-07-11 | Discharge: 2016-07-11 | Disposition: A | Discharge status: Home / Self Care | Payer: Managed Care, Other (non HMO) | Source: Ambulatory Visit | Attending: Cardiology | Admitting: Cardiology

## 2016-07-11 ENCOUNTER — Encounter (HOSPITAL_COMMUNITY): Payer: Self-pay

## 2016-07-11 ENCOUNTER — Encounter (HOSPITAL_COMMUNITY)
Admission: RE | Disposition: A | Discharge status: Home / Self Care | Payer: Self-pay | Source: Ambulatory Visit | Attending: Cardiology

## 2016-07-11 DIAGNOSIS — Z880 Allergy status to penicillin: Secondary | ICD-10-CM | POA: Insufficient documentation

## 2016-07-11 DIAGNOSIS — Z7901 Long term (current) use of anticoagulants: Secondary | ICD-10-CM | POA: Insufficient documentation

## 2016-07-11 DIAGNOSIS — Z87891 Personal history of nicotine dependence: Secondary | ICD-10-CM | POA: Diagnosis not present

## 2016-07-11 DIAGNOSIS — Z6841 Body Mass Index (BMI) 40.0 and over, adult: Secondary | ICD-10-CM | POA: Insufficient documentation

## 2016-07-11 DIAGNOSIS — Z8249 Family history of ischemic heart disease and other diseases of the circulatory system: Secondary | ICD-10-CM | POA: Insufficient documentation

## 2016-07-11 DIAGNOSIS — I10 Essential (primary) hypertension: Secondary | ICD-10-CM | POA: Diagnosis not present

## 2016-07-11 DIAGNOSIS — Z86718 Personal history of other venous thrombosis and embolism: Secondary | ICD-10-CM | POA: Diagnosis not present

## 2016-07-11 DIAGNOSIS — I480 Paroxysmal atrial fibrillation: Secondary | ICD-10-CM | POA: Diagnosis not present

## 2016-07-11 DIAGNOSIS — K219 Gastro-esophageal reflux disease without esophagitis: Secondary | ICD-10-CM | POA: Insufficient documentation

## 2016-07-11 DIAGNOSIS — E0590 Thyrotoxicosis, unspecified without thyrotoxic crisis or storm: Secondary | ICD-10-CM | POA: Insufficient documentation

## 2016-07-11 HISTORY — PX: CARDIOVERSION: SHX1299

## 2016-07-11 SURGERY — CARDIOVERSION
Anesthesia: Monitor Anesthesia Care

## 2016-07-11 MED ORDER — PROPOFOL 10 MG/ML IV BOLUS
INTRAVENOUS | Status: DC | PRN
  Administered 2016-07-11: 80 mg via INTRAVENOUS
  Administered 2016-07-11: 70 mg via INTRAVENOUS

## 2016-07-11 MED ORDER — LIDOCAINE HCL (CARDIAC) 20 MG/ML IV SOLN
INTRAVENOUS | Status: DC | PRN
  Administered 2016-07-11: 20 mg via INTRATRACHEAL

## 2016-07-11 NOTE — H&P (Signed)
Progress Notes Date of Service: 07/02/2016 11:59 PM Newman Nip, NP  Electrophysiology    [] Hide copied text     Primary Care Physician: Cala Bradford, MD Referring Physician: Dr. Royden Purl is a 60 y.o. female with a h/o PAF, obesity, HTN, ambulatory issues due to severe fallen arch of left arch, in afib clinic today for f/u cardioversion set up by Dr. Johney Frame. Did shockl out but felt felt like she returned to afib 1-2 days ago. Has a feeling of uneasiness in her chest when in afib. Is sedentary due to ambulatory issues and does not really notice shortness of breath. Contributes fatigue to rasing three grandchildren. She would prefer to be in SR. On xarelto for chadsvasc score of 2. Is pending a sleep study for h/o snoring. Drinks 3-4 drinks of wine a week. No excessive caffeine. No smoking.  Today, she denies symptoms of palpitations, chest pain, shortness of breath, orthopnea, PND, lower extremity edema, dizziness, presyncope, syncope, or neurologic sequela. The patient is tolerating medications without difficulties and is otherwise without complaint today.       Past Medical History:  Diagnosis Date  . Atrial fibrillation (HCC)   . Chronic lower back pain   . Depression   . DVT (deep venous thrombosis) (HCC) 1990s   LLE  . Hypertension   . Hyperthyroidism ~ 2000   "fine now" (04/23/2016)  . Obesity   . Pneumonia 1980s X 1  . Snoring    has not had sleep study but suspects sleep apnea        Past Surgical History:  Procedure Laterality Date  . CARDIOVERSION N/A 06/18/2016   Procedure: CARDIOVERSION;  Surgeon: Laurey Morale, MD;  Location: [facility] ENDOSCOPY;  Service: Cardiovascular;  Laterality: N/A;  . ENDOMETRIAL ABLATION  ~2006  . TONSILLECTOMY  1960s  . TUBAL LIGATION  1979          Current Outpatient Prescriptions  Medication Sig Dispense Refill  . amLODipine (NORVASC) 5 MG tablet Take 1 tablet by mouth daily.  0  .  clonazePAM (KLONOPIN) 0.5 MG tablet Take 1 tablet by mouth 3 (three) times daily as needed for anxiety.   0  . cyanocobalamin 500 MCG tablet Take 500 mcg by mouth daily.    . DULoxetine (CYMBALTA) 60 MG capsule Take 2 capsules by mouth daily.  0  . ibuprofen (ADVIL,MOTRIN) 200 MG tablet Take 200 mg by mouth as needed.    Marland Kitchen losartan (COZAAR) 100 MG tablet Take 1 tablet (100 mg total) by mouth daily. 30 tablet 1  . pantoprazole (PROTONIX) 40 MG tablet Take 1 tablet (40 mg total) by mouth daily. 30 tablet 1  . traMADol (ULTRAM) 50 MG tablet Take 1 tablet by mouth 2 (two) times daily as needed (pain).   0  . XARELTO 20 MG TABS tablet Take 1 tablet by mouth daily.    . sucralfate (CARAFATE) 1 GM/10ML suspension Take 10 mLs (1 g total) by mouth 4 (four) times daily -  with meals and at bedtime. (Patient not taking: Reported on 07/02/2016) 420 mL 1   No current facility-administered medications for this encounter.         Allergies  Allergen Reactions  . Penicillins Hives and Shortness Of Breath  . Codeine Hives    Social History        Social History  . Marital status: Married    Spouse name: TERRY  . Number of children: 2  . Years  of education: N/A       Occupational History  . [employer] OFFICES          Social History Main Topics  . Smoking status: Former Smoker    Packs/day: 0.75    Years: 10.00    Types: Cigarettes  . Smokeless tobacco: Never Used     Comment: 04/23/2016 "stopped smoking cigarettes in ~ 1989"  . Alcohol use 2.4 oz/week    4 Glasses of wine per week     Comment: recently quit  . Drug use:     Types: Marijuana     Comment: 04/23/2016 "recreational marijuana in my late teens/early 20's"  . Sexual activity: Not Currently       Other Topics Concern  . Not on file      Social History Narrative   [place] in [place] with husband and 3 grandchildren.   Self employed office cleaner.         Family History    Problem Relation Age of Onset  . Diabetes Mellitus I Father   . CVA Father   . Arthritis Mother   . Obesity Mother   . CAD Maternal Grandfather     ROS- All systems are reviewed and negative except as per the HPI above  Physical Exam:    Vitals:   07/02/16 1021  BP: 118/78  Pulse: 89  Weight: (!) 303 lb (137.4 kg)  Height: 5\' 3"  (1.6 m)    GEN- The patient is well appearing, alert and oriented x 3 today.   Head- normocephalic, atraumatic Eyes-  Sclera clear, conjunctiva pink Ears- hearing intact Oropharynx- clear Neck- supple, no JVP Lymph- no cervical lymphadenopathy Lungs- Clear to ausculation bilaterally, normal work of breathing Heart- irregular rate and rhythm, no murmurs, rubs or gallops, PMI not laterally displaced GI- soft, NT, ND, + BS Extremities- no clubbing, cyanosis, or edema MS- no significant deformity or atrophy Skin- no rash or lesion Psych- euthymic mood, full affect Neuro- strength and sensation are intact  EKG- afib at 89 bpm, qrs int 88 ms, qtc 428 ms Epic records reviewed Echo-Technically difficult study, Definity contrast administered. LVEF 60-65%, grossly normal wall motion, normal wall thickness, aortic sclerosis with trivial AI, moderate LAE, mild RAE, mild RVE with mildly reduced RV function, normal IVC. Pharmacologic 2 day  myoview-  Perfusion: No decreased activity in the left ventricle on stress imaging to suggest reversible ischemia or infarction. Mild breast attenuation of the anterior wall.  Wall Motion: Normal left ventricular wall motion. No left ventricular dilation.  Left Ventricular Ejection Fraction: 54 %  End diastolic volume 114 ml  End systolic volume 52 ml  IMPRESSION: 1. No reversible ischemia or infarction.  2. Normal left ventricular wall motion.  3. Left ventricular ejection fraction 54%  4. Non invasive risk stratification*: Low  EKG- in SR, after cardioversion, shows SR  with first degree AVB with pr interval of 212 ms  Creatinine-0.68 ms, K+4.3, NA 141  Assessment and Plan:  1. PAF Successful cardioversion but ERAF Discussion of afib and lifestyle issues, management  Pt would like to pursue antiarrythmic Discussed options, I think Multaq or flecainide would be best choice I will discuss with Dr. Johney FrameAllred and then proceed appropriately  2. Obseity Offered free diet class and pt is interested Difficulty with exercise due to foot issues that she plans to have surgery in the next 1-2 years  3. Snoring Pending sleep study  4. HTN Well managed  F/u per plan after discussion  with Dr. Nehemiah Settle C. [facility] Folks [facility] [address] [place], [place] [address] [phone]   9/11- Addendum- Dr. Johney Frame agreed with either multaq or flecainide. First degree block at baseline and flecainide could lengthen. I discussed with pt use of multaq or flecainide and she would like to use multaq. Qtc reviewed in SR and not prolonged. Instructed that she will start drug bid with food and made aware that sometimes mild GI issues may occur like mild nausea or diarrhea but food usually blunts the response. If she has symptoms usually abate after a few weeks. She will then be cardioverted 3 days later. F/u one week here after cardioversion    Electronically signed by Newman Nip, NP at 07/03/2016 9:06 AM Electronically signed by Newman Nip, NP at 07/08/2016 8:40 AM    For DCCV; no changes; compliant with meds Olga Millers

## 2016-07-11 NOTE — Procedures (Signed)
Electrical Cardioversion Procedure Note Ricardo Jerichoamela S Hofstra [id number] May 19, 1956  Procedure: Electrical Cardioversion Indications:  Atrial Fibrillation  Procedure Details Consent: Risks of procedure as well as the alternatives and risks of each were explained to the (patient/caregiver).  Consent for procedure obtained. Time Out: Verified patient identification, verified procedure, site/side was marked, verified correct patient position, special equipment/implants available, medications/allergies/relevent history reviewed, required imaging and test results available.  Performed  Patient placed on cardiac monitor, pulse oximetry, supplemental oxygen as necessary.  Sedation given: Pt sedated by anesthesia with diprovan 150 mg IV. Pacer pads placed anterior and posterior chest.  Cardioverted 2 time(s).  Cardioverted at 120J unsuccessful; 150J resulted in sinus rhythm.  Evaluation Findings: Post procedure EKG shows: NSR Complications: None Patient did tolerate procedure well.   Olga MillersBrian Crenshaw 07/11/2016, 1:07 PM

## 2016-07-11 NOTE — Discharge Instructions (Signed)
Electrical Cardioversion, Care After °Refer to this sheet in the next few weeks. These instructions provide you with information on caring for yourself after your procedure. Your health care provider may also give you more specific instructions. Your treatment has been planned according to current medical practices, but problems sometimes occur. Call your health care provider if you have any problems or questions after your procedure. °WHAT TO EXPECT AFTER THE PROCEDURE °After your procedure, it is typical to have the following sensations: °· Some redness on the skin where the shocks were delivered. If this is tender, a sunburn lotion or hydrocortisone cream may help. °· Possible return of an abnormal heart rhythm within hours or days after the procedure. °HOME CARE INSTRUCTIONS °· Take medicines only as directed by your health care provider. Be sure you understand how and when to take your medicine. °· Learn how to feel your pulse and check it often. °· Limit your activity for 48 hours after the procedure or as directed by your health care provider. °· Avoid or minimize caffeine and other stimulants as directed by your health care provider. °SEEK MEDICAL CARE IF: °· You feel like your heart is beating too fast or your pulse is not regular. °· You have any questions about your medicines. °· You have bleeding that will not stop. °SEEK IMMEDIATE MEDICAL CARE IF: °· You are dizzy or feel faint. °· It is hard to breathe or you feel short of breath. °· There is a change in discomfort in your chest. °· Your speech is slurred or you have trouble moving an arm or leg on one side of your body. °· You get a serious muscle cramp that does not go away. °· Your fingers or toes turn cold or blue. °  °This information is not intended to replace advice given to you by your health care provider. Make sure you discuss any questions you have with your health care provider. °  °Document Released: 08/03/2013 Document Revised: 11/03/2014  Document Reviewed: 08/03/2013 °Elsevier Interactive Patient Education ©2016 [employer] Inc[employer] ° °

## 2016-07-11 NOTE — Anesthesia Postprocedure Evaluation (Signed)
Anesthesia Post Note  Patient: [name]  Procedure(s) Performed: Procedure(s) (LRB): CARDIOVERSION (N/A)  Patient location during evaluation: Endoscopy Anesthesia Type: MAC Level of consciousness: awake and alert, lethargic and oriented Pain management: pain level controlled Vital Signs Assessment: post-procedure vital signs reviewed and stable Respiratory status: spontaneous breathing, nonlabored ventilation and respiratory function stable Cardiovascular status: blood pressure returned to baseline and stable Postop Assessment: no signs of nausea or vomiting Anesthetic complications: no    Last Vitals:  Vitals:   07/11/16 1251 07/11/16 1402  BP: 128/75 127/77  Pulse: 73 64  Resp: (!) 22 (!) 22    Last Pain:  Vitals:   07/11/16 1251  TempSrc: Oral                 JACKSON,E. CARSWELL

## 2016-07-11 NOTE — Transfer of Care (Signed)
Immediate Anesthesia Transfer of Care Note  Patient: Abigail JerichoPamela S Allington  Procedure(s) Performed: Procedure(s): CARDIOVERSION (N/A)  Patient Location: Endoscopy Unit  Anesthesia Type:MAC  Level of Consciousness: awake, alert  and oriented  Airway & Oxygen Therapy: Patient Spontanous Breathing and Patient connected to nasal cannula oxygen  Post-op Assessment: Report given to RN, Post -op Vital signs reviewed and stable and Patient moving all extremities X 4  Post vital signs: Reviewed and stable  Last Vitals:  Vitals:   07/11/16 1251  BP: 128/75  Pulse: 73  Resp: (!) 22    Last Pain:  Vitals:   07/11/16 1251  TempSrc: Oral         Complications: No apparent anesthesia complications

## 2016-07-11 NOTE — Anesthesia Preprocedure Evaluation (Signed)
Anesthesia Evaluation  Patient identified by MRN, date of birth, ID band Patient awake    Reviewed: Allergy & Precautions, NPO status , Patient's Chart, lab work & pertinent test results  History of Anesthesia Complications Negative for: history of anesthetic complications  Airway Mallampati: II  TM Distance: >3 FB Neck ROM: Full    Dental  (+) Missing, Dental Advisory Given   Pulmonary former smoker,    breath sounds clear to auscultation       Cardiovascular hypertension, Pt. on medications (-) angina+ DVT   Rhythm:Irregular Rate:Normal  6/17 ECHO: EF 60-65%   Neuro/Psych    GI/Hepatic Neg liver ROS, GERD  Medicated and Controlled,  Endo/Other  Morbid obesity  Renal/GU negative Renal ROS     Musculoskeletal   Abdominal (+) + obese,   Peds  Hematology  (+) Blood dyscrasia (xarelto), ,   Anesthesia Other Findings   Reproductive/Obstetrics                             Anesthesia Physical Anesthesia Plan  ASA: III  Anesthesia Plan: MAC   Post-op Pain Management:    Induction: Intravenous  Airway Management Planned: Mask  Additional Equipment:   Intra-op Plan:   Post-operative Plan:   Informed Consent: I have reviewed the patients History and Physical, chart, labs and discussed the procedure including the risks, benefits and alternatives for the proposed anesthesia with the patient or authorized representative who has indicated his/her understanding and acceptance.   Dental advisory given  Plan Discussed with: CRNA and Surgeon  Anesthesia Plan Comments: (Plan routine monitors, MAC)        Anesthesia Quick Evaluation

## 2016-07-14 ENCOUNTER — Encounter (HOSPITAL_COMMUNITY): Payer: Self-pay | Admitting: Cardiology

## 2016-07-18 ENCOUNTER — Encounter (HOSPITAL_COMMUNITY): Payer: Self-pay | Admitting: Nurse Practitioner

## 2016-07-18 ENCOUNTER — Ambulatory Visit (HOSPITAL_COMMUNITY)
Admission: RE | Admit: 2016-07-18 | Discharge: 2016-07-18 | Disposition: A | Discharge status: Home / Self Care | Payer: Managed Care, Other (non HMO) | Source: Ambulatory Visit | Attending: Nurse Practitioner | Admitting: Nurse Practitioner

## 2016-07-18 VITALS — BP 126/78 | HR 64 | Ht 63.0 in | Wt 304.6 lb

## 2016-07-18 DIAGNOSIS — F329 Major depressive disorder, single episode, unspecified: Secondary | ICD-10-CM | POA: Insufficient documentation

## 2016-07-18 DIAGNOSIS — E0590 Thyrotoxicosis, unspecified without thyrotoxic crisis or storm: Secondary | ICD-10-CM | POA: Insufficient documentation

## 2016-07-18 DIAGNOSIS — I481 Persistent atrial fibrillation: Secondary | ICD-10-CM | POA: Diagnosis not present

## 2016-07-18 DIAGNOSIS — Z87891 Personal history of nicotine dependence: Secondary | ICD-10-CM | POA: Insufficient documentation

## 2016-07-18 DIAGNOSIS — R0683 Snoring: Secondary | ICD-10-CM | POA: Diagnosis not present

## 2016-07-18 DIAGNOSIS — Z9889 Other specified postprocedural states: Secondary | ICD-10-CM | POA: Insufficient documentation

## 2016-07-18 DIAGNOSIS — I10 Essential (primary) hypertension: Secondary | ICD-10-CM | POA: Diagnosis not present

## 2016-07-18 DIAGNOSIS — E669 Obesity, unspecified: Secondary | ICD-10-CM | POA: Insufficient documentation

## 2016-07-18 DIAGNOSIS — Z7901 Long term (current) use of anticoagulants: Secondary | ICD-10-CM | POA: Diagnosis not present

## 2016-07-18 DIAGNOSIS — Z823 Family history of stroke: Secondary | ICD-10-CM | POA: Diagnosis not present

## 2016-07-18 DIAGNOSIS — Z880 Allergy status to penicillin: Secondary | ICD-10-CM | POA: Insufficient documentation

## 2016-07-18 DIAGNOSIS — I480 Paroxysmal atrial fibrillation: Secondary | ICD-10-CM | POA: Insufficient documentation

## 2016-07-18 DIAGNOSIS — I4819 Other persistent atrial fibrillation: Secondary | ICD-10-CM

## 2016-07-18 NOTE — Progress Notes (Signed)
Primary Care Physician: Cala Bradford, MD Referring Physician: Dr. Royden Purl is a 60 y.o. female with a h/o PAF, obesity, HTN, ambulatory issues due to severe fallen arch of left arch, in afib clinic today for f/u cardioversion set up by Dr. Johney Frame. Did shockl out but felt felt like she returned to afib 1-2 days ago. Has a feeling of uneasiness in her chest when in afib. Is sedentary due to ambulatory issues and does not really notice shortness of breath. Contributes fatigue to rasing three grandchildren. She would prefer to be in SR. On xarelto for chadsvasc score of 2. Is pending a sleep study for h/o snoring. Drinks 3-4 drinks of wine a week. No excessive caffeine. No smoking.  F/u in afib clinic 9/22, on last visit, it was decided to take multaq 400 mg bid and then proceed to repeat cardioversion, allowing drug to load x 3 days. She returns today after successful cardioversion and is staying in SR. She has noted for the last couple of days that she may be retaining some fluid. Weight is up 1 pound. Discussed waiting  to see if fluid retention continues, if so, allow for wash out and would start flecainide. She is in agreement. Does feel better in SR. No GI  Issues on drug.  Today, she denies symptoms of palpitations, chest pain, shortness of breath, orthopnea, PND, lower extremity edema, dizziness, presyncope, syncope, or neurologic sequela. The patient is tolerating medications without difficulties and is otherwise without complaint today.   Past Medical History:  Diagnosis Date  . Atrial fibrillation (HCC)   . Chronic lower back pain   . Depression   . DVT (deep venous thrombosis) (HCC) 1990s   LLE  . Hypertension   . Hyperthyroidism ~ 2000   "fine now" (04/23/2016)  . Obesity   . Pneumonia 1980s X 1  . Snoring    has not had sleep study but suspects sleep apnea   Past Surgical History:  Procedure Laterality Date  . CARDIOVERSION N/A 06/18/2016   Procedure:  CARDIOVERSION;  Surgeon: Laurey Morale, MD;  Location: [facility] ENDOSCOPY;  Service: Cardiovascular;  Laterality: N/A;  . CARDIOVERSION N/A 07/11/2016   Procedure: CARDIOVERSION;  Surgeon: Lewayne Bunting, MD;  Location: [facility] ENDOSCOPY;  Service: Cardiovascular;  Laterality: N/A;  . ENDOMETRIAL ABLATION  ~2006  . TONSILLECTOMY  1960s  . TUBAL LIGATION  1979    Current Outpatient Prescriptions  Medication Sig Dispense Refill  . amLODipine (NORVASC) 5 MG tablet Take 1 tablet by mouth daily.  0  . clonazePAM (KLONOPIN) 0.5 MG tablet Take 1 tablet by mouth 3 (three) times daily as needed for anxiety.   0  . cyanocobalamin 500 MCG tablet Take 500 mcg by mouth daily.    Marland Kitchen dronedarone (MULTAQ) 400 MG tablet Take 1 tablet (400 mg total) by mouth 2 (two) times daily with a meal. 60 tablet 6  . DULoxetine (CYMBALTA) 60 MG capsule Take 2 capsules by mouth daily.  0  . ibuprofen (ADVIL,MOTRIN) 200 MG tablet Take 200 mg by mouth as needed.    Marland Kitchen losartan (COZAAR) 100 MG tablet Take 1 tablet (100 mg total) by mouth daily. 30 tablet 1  . pantoprazole (PROTONIX) 40 MG tablet Take 1 tablet (40 mg total) by mouth daily. 30 tablet 1  . traMADol (ULTRAM) 50 MG tablet Take 1 tablet by mouth 2 (two) times daily as needed (pain).   0  . XARELTO 20 MG TABS  tablet Take 1 tablet by mouth daily.     No current facility-administered medications for this encounter.     Allergies  Allergen Reactions  . Penicillins Hives and Shortness Of Breath  . Codeine Hives    Social History   Social History  . Marital status: Married    Spouse name: TERRY  . Number of children: 2  . Years of education: N/A   Occupational History  . [employer]    Social History Main Topics  . Smoking status: Former Smoker    Packs/day: 0.75    Years: 10.00    Types: Cigarettes  . Smokeless tobacco: Never Used     Comment: 04/23/2016 "stopped smoking cigarettes in ~ 1989"  . Alcohol use 2.4 oz/week    4 Glasses of wine per week      Comment: recently quit  . Drug use:     Types: Marijuana     Comment: 04/23/2016 "recreational marijuana in my late teens/early 20's"  . Sexual activity: Not Currently   Other Topics Concern  . Not on file   Social History Narrative   Lives in [place] with husband and 3 grandchildren.   Self employed office cleaner.    Family History  Problem Relation Age of Onset  . Diabetes Mellitus I Father   . CVA Father   . Arthritis Mother   . Obesity Mother   . CAD Maternal Grandfather     ROS- All systems are reviewed and negative except as per the HPI above  Physical Exam: Vitals:   07/18/16 1001  BP: 126/78  Pulse: 64  Weight: (!) 304 lb 9.6 oz (138.2 kg)  Height: 5\' 3"  (1.6 m)    GEN- The patient is well appearing, alert and oriented x 3 today.   Head- normocephalic, atraumatic Eyes-  Sclera clear, conjunctiva pink Ears- hearing intact Oropharynx- clear Neck- supple, no JVP Lymph- no cervical lymphadenopathy Lungs- Clear to ausculation bilaterally, normal work of breathing Heart-regular rate and rhythm, no murmurs, rubs or gallops, PMI not laterally displaced GI- soft, NT, ND, + BS Extremities- no clubbing, cyanosis, or trace to 1+ edema MS- no significant deformity or atrophy Skin- no rash or lesion Psych- euthymic mood, full affect Neuro- strength and sensation are intact  EKG- SR at 64 bpm, pr int 182 ms, qrs int 96 ms, qtc 410 ms Epic records reviewed Echo-Technically difficult study, Definity contrast administered. LVEF   60-65%, grossly normal wall motion, normal wall thickness, aortic   sclerosis with trivial AI, moderate LAE, mild RAE, mild RVE with   mildly reduced RV function, normal IVC. Pharmacologic 2 day  myoview-  Perfusion: No decreased activity in the left ventricle on stress imaging to suggest reversible ischemia or infarction. Mild breast attenuation of the anterior wall.  Wall Motion: Normal left ventricular wall motion. No  left ventricular dilation.  Left Ventricular Ejection Fraction: 54 %  End diastolic volume 114 ml  End systolic volume 52 ml  IMPRESSION: 1. No reversible ischemia or infarction.  2. Normal left ventricular wall motion.  3. Left ventricular ejection fraction 54%  4. Non invasive risk stratification*: Low  EKG- in SR at 64 bpm, pr int 182 ms, qrs int 96 ms, qtc 410 ms  Creatinine-0.68 ms, K+4.3, NA 141  Assessment and Plan:  1. PAF Successful cardioversion but ERAF Loaded on multaq 500 mg bid x 3 days and then with repeat cardioversion, which was successful. Now with some fluid retention issues, possibly multaq, will  watch carefully over next two weeks  If does not resolve, consider stopping drug and using flecainide Discussion of afib and lifestyle issues, management   2. Obseity Offered free diet class  Difficulty with exercise due to foot issues that she plans to have surgery in the next 1-2 years  3. Snoring Pending sleep study  4. HTN Well managed  F/u 2 weeks, sooner if fluid retention gets worse.  Elvina Sidleonna C. Matthew Folksarroll, ANP-C Afib Clinic [facility] [address] [place], [place] [address] [phone]

## 2016-07-31 ENCOUNTER — Encounter (HOSPITAL_COMMUNITY): Payer: Self-pay | Admitting: Nurse Practitioner

## 2016-07-31 ENCOUNTER — Ambulatory Visit (HOSPITAL_COMMUNITY)
Admission: RE | Admit: 2016-07-31 | Discharge: 2016-07-31 | Disposition: A | Discharge status: Home / Self Care | Payer: Managed Care, Other (non HMO) | Source: Ambulatory Visit | Attending: Nurse Practitioner | Admitting: Nurse Practitioner

## 2016-07-31 DIAGNOSIS — E0590 Thyrotoxicosis, unspecified without thyrotoxic crisis or storm: Secondary | ICD-10-CM | POA: Insufficient documentation

## 2016-07-31 DIAGNOSIS — F329 Major depressive disorder, single episode, unspecified: Secondary | ICD-10-CM | POA: Insufficient documentation

## 2016-07-31 DIAGNOSIS — Z888 Allergy status to other drugs, medicaments and biological substances status: Secondary | ICD-10-CM | POA: Diagnosis not present

## 2016-07-31 DIAGNOSIS — Z7901 Long term (current) use of anticoagulants: Secondary | ICD-10-CM | POA: Insufficient documentation

## 2016-07-31 DIAGNOSIS — Z8249 Family history of ischemic heart disease and other diseases of the circulatory system: Secondary | ICD-10-CM | POA: Diagnosis not present

## 2016-07-31 DIAGNOSIS — Z880 Allergy status to penicillin: Secondary | ICD-10-CM | POA: Insufficient documentation

## 2016-07-31 DIAGNOSIS — Z9889 Other specified postprocedural states: Secondary | ICD-10-CM | POA: Insufficient documentation

## 2016-07-31 DIAGNOSIS — Z6841 Body Mass Index (BMI) 40.0 and over, adult: Secondary | ICD-10-CM | POA: Diagnosis not present

## 2016-07-31 DIAGNOSIS — I4819 Other persistent atrial fibrillation: Secondary | ICD-10-CM

## 2016-07-31 DIAGNOSIS — I10 Essential (primary) hypertension: Secondary | ICD-10-CM

## 2016-07-31 DIAGNOSIS — R001 Bradycardia, unspecified: Secondary | ICD-10-CM | POA: Diagnosis not present

## 2016-07-31 DIAGNOSIS — I481 Persistent atrial fibrillation: Secondary | ICD-10-CM | POA: Insufficient documentation

## 2016-07-31 DIAGNOSIS — Z87891 Personal history of nicotine dependence: Secondary | ICD-10-CM | POA: Diagnosis not present

## 2016-07-31 DIAGNOSIS — R600 Localized edema: Secondary | ICD-10-CM

## 2016-07-31 DIAGNOSIS — Z79899 Other long term (current) drug therapy: Secondary | ICD-10-CM | POA: Insufficient documentation

## 2016-07-31 DIAGNOSIS — Z823 Family history of stroke: Secondary | ICD-10-CM | POA: Diagnosis not present

## 2016-07-31 LAB — BASIC METABOLIC PANEL
Anion gap: 7 (ref 5–15)
BUN: 13 mg/dL (ref 6–20)
CO2: 27 mmol/L (ref 22–32)
Calcium: 9.1 mg/dL (ref 8.9–10.3)
Chloride: 104 mmol/L (ref 101–111)
Creatinine, Ser: 0.83 mg/dL (ref 0.44–1.00)
GFR calc Af Amer: >60 mL/min (ref >60)
GFR calc non Af Amer: >60 mL/min (ref >60)
Glucose, Bld: 82 mg/dL (ref 65–99)
Potassium: 4.6 mmol/L (ref 3.5–5.1)
Sodium: 138 mmol/L (ref 135–145)

## 2016-07-31 MED ORDER — LOSARTAN POTASSIUM-HCTZ 100-25 MG PO TABS: 1.0000 | ORAL_TABLET | Freq: Every day | ORAL | Status: DC

## 2016-07-31 NOTE — Patient Instructions (Signed)
Your physician has recommended you make the following change in your medication:  1)Go back to Losartan/HCTZ Have a recheck with your primary physician

## 2016-07-31 NOTE — Progress Notes (Signed)
Primary Care Physician: Cala BradfordWHITE,CYNTHIA S, MD Primary Electrophysiologist: Royden PurlAllred  [name] is a 60 y.o. female with a history of persistent atrial fibrillation who presents [facility].  Since last being seen in clinic, the patient reports doing reasonably well.  In retrospect, she feels that she was probably in AF longer than she originally thought. Since last cardioversion she has decreased shortness of breath and exercise intolerance.  She does have persistent LE edema. She was previously on Losartan/HCTZ which did well to control edema but this was discontinued 2/2 hypokalemia in the past.  Today, she denies symptoms of palpitations, chest pain, shortness of breath, orthopnea, PND, dizziness, presyncope, syncope, snoring, daytime somnolence, bleeding, or neurologic sequela. The patient is tolerating medications without difficulties and is otherwise without complaint today.    Past Medical History:  Diagnosis Date  . Atrial fibrillation (HCC)   . Chronic lower back pain   . Depression   . DVT (deep venous thrombosis) (HCC) 1990s   LLE  . Hypertension   . Hyperthyroidism ~ 2000   "fine now" (04/23/2016)  . Obesity   . Pneumonia 1980s X 1  . Snoring    has not had sleep study but suspects sleep apnea   Past Surgical History:  Procedure Laterality Date  . CARDIOVERSION N/A 06/18/2016   Procedure: CARDIOVERSION;  Surgeon: Laurey Moralealton S McLean, MD;  [facility]COPY;  Service: Cardiovascular;  Laterality: N/A;  . CARDIOVERSION N/A 07/11/2016   Procedure: CARDIOVERSION;  Surgeon: Lewayne BuntingBrian S Crenshaw, MD;  [facility];  Service: Cardiovascular;  Laterality: N/A;  . ENDOMETRIAL ABLATION  ~2006  . TONSILLECTOMY  1960s  . TUBAL LIGATION  1979    Current Outpatient Prescriptions  Medication Sig Dispense Refill  . amLODipine (NORVASC) 5 MG tablet Take 1 tablet by mouth daily.  0  . clonazePAM (KLONOPIN) 0.5 MG tablet Take 1  tablet by mouth 3 (three) times daily as needed for anxiety.   0  . cyanocobalamin 500 MCG tablet Take 500 mcg by mouth daily.    Marland Kitchen. dronedarone (MULTAQ) 400 MG tablet Take 1 tablet (400 mg total) by mouth 2 (two) times daily with a meal. 60 tablet 6  . DULoxetine (CYMBALTA) 60 MG capsule Take 2 capsules by mouth daily.  0  . ibuprofen (ADVIL,MOTRIN) 200 MG tablet Take 200 mg by mouth as needed.    Marland Kitchen. losartan (COZAAR) 100 MG tablet Take 1 tablet (100 mg total) by mouth daily. 30 tablet 1  . pantoprazole (PROTONIX) 40 MG tablet Take 1 tablet (40 mg total) by mouth daily. 30 tablet 1  . traMADol (ULTRAM) 50 MG tablet Take 1 tablet by mouth 2 (two) times daily as needed (pain).   0  . XARELTO 20 MG TABS tablet Take 1 tablet by mouth daily.     No current facility-administered medications for this encounter.     Allergies  Allergen Reactions  . Penicillins Hives and Shortness Of Breath  . Codeine Hives    Social History   Social History  . Marital status: Married    Spouse name: TERRY  . Number of children: 2  . Years of education: N/A   Occupational History  . [employer] OFFICES    Social History Main Topics  . Smoking status: Former Smoker    Packs/day: 0.75    Years: 10.00    Types: Cigarettes  . Smokeless tobacco: Never Used     Comment: 04/23/2016 "  stopped smoking cigarettes in ~ 1989"  . Alcohol use 2.4 oz/week    4 Glasses of wine per week     Comment: recently quit  . Drug use:     Types: Marijuana     Comment: 04/23/2016 "recreational marijuana in my late teens/early 20's"  . Sexual activity: Not Currently   Other Topics Concern  . Not on file   Social History Narrative   Lives in [place] with husband and 3 grandchildren.   Self employed office cleaner.    Family History  Problem Relation Age of Onset  . Diabetes Mellitus I Father   . CVA Father   . Arthritis Mother   . Obesity Mother   . CAD Maternal Grandfather     ROS- All systems are reviewed and  negative except as per the HPI above.  Physical Exam: Vitals:   07/31/16 0957  BP: 126/78  Pulse: (!) 58  Weight: 299 lb 3.2 oz (135.7 kg)  Height: 5\' 3"  (1.6 m)    GEN- The patient is obese appearing, alert and oriented x 3 today.   Head- normocephalic, atraumatic Eyes-  Sclera clear, conjunctiva pink Ears- hearing intact Oropharynx- clear Neck- supple  Lungs- Clear to ausculation bilaterally, normal work of breathing Heart- Regular rate and rhythm, no murmurs, rubs or gallops  GI- obese, NT, ND, + BS Extremities- no clubbing, cyanosis, +dependent bilateral LEE, brace left foot MS- no significant deformity or atrophy Skin- no rash or lesion Psych- euthymic mood, full affect Neuro- strength and sensation are intact  Wt Readings from Last 3 Encounters:  07/31/16 299 lb 3.2 oz (135.7 kg)  07/18/16 (!) 304 lb 9.6 oz (138.2 kg)  07/11/16 290 lb (131.5 kg)    EKG today demonstrates sinus bradycardia, rate 58 Echo 03/2016 demonstrated EF 60-65%, no RWMA, LA 43  Epic records are reviewed at length today  Assessment and Plan:  1. Persistent atrial fibrillation Maintaining SR on Multaq Continue Xarelto for CHADS2VASC of 2  2. Morbid obesity Body mass index is 53 kg/m. As above, lifestyle modification was discussed at length including regular exercise and weight reduction. She is making concerted efforts at weight loss.  Exercise capacity is limited 2/2 leg brace  3.  HTN Stable No change required today  4.  LE edema Previously improved on Losartan/HCTZ Will add HCTZ back today BMET today Recheck BMET on 10/19 at PCP follow up  Follow up with AF clinic in 4-6 weeks.   Gypsy Balsam, NP 07/31/2016 10:08 AM

## 2016-08-14 ENCOUNTER — Encounter: Payer: Self-pay | Admitting: Internal Medicine

## 2016-09-09 ENCOUNTER — Encounter (HOSPITAL_COMMUNITY): Payer: Self-pay | Admitting: Nurse Practitioner

## 2016-09-09 ENCOUNTER — Ambulatory Visit (HOSPITAL_COMMUNITY)
Admission: RE | Admit: 2016-09-09 | Discharge: 2016-09-09 | Disposition: A | Discharge status: Home / Self Care | Payer: Managed Care, Other (non HMO) | Source: Ambulatory Visit | Attending: Nurse Practitioner | Admitting: Nurse Practitioner

## 2016-09-09 VITALS — BP 132/76 | HR 64 | Ht 63.0 in | Wt 297.8 lb

## 2016-09-09 DIAGNOSIS — Z86718 Personal history of other venous thrombosis and embolism: Secondary | ICD-10-CM | POA: Diagnosis not present

## 2016-09-09 DIAGNOSIS — Z7901 Long term (current) use of anticoagulants: Secondary | ICD-10-CM | POA: Insufficient documentation

## 2016-09-09 DIAGNOSIS — M2142 Flat foot [pes planus] (acquired), left foot: Secondary | ICD-10-CM | POA: Diagnosis not present

## 2016-09-09 DIAGNOSIS — Z87891 Personal history of nicotine dependence: Secondary | ICD-10-CM | POA: Diagnosis not present

## 2016-09-09 DIAGNOSIS — Z823 Family history of stroke: Secondary | ICD-10-CM | POA: Insufficient documentation

## 2016-09-09 DIAGNOSIS — I480 Paroxysmal atrial fibrillation: Secondary | ICD-10-CM | POA: Insufficient documentation

## 2016-09-09 DIAGNOSIS — G8929 Other chronic pain: Secondary | ICD-10-CM | POA: Insufficient documentation

## 2016-09-09 DIAGNOSIS — I10 Essential (primary) hypertension: Secondary | ICD-10-CM | POA: Diagnosis not present

## 2016-09-09 DIAGNOSIS — R0683 Snoring: Secondary | ICD-10-CM | POA: Insufficient documentation

## 2016-09-09 DIAGNOSIS — I4819 Other persistent atrial fibrillation: Secondary | ICD-10-CM

## 2016-09-09 DIAGNOSIS — Z8249 Family history of ischemic heart disease and other diseases of the circulatory system: Secondary | ICD-10-CM | POA: Insufficient documentation

## 2016-09-09 DIAGNOSIS — M545 Low back pain: Secondary | ICD-10-CM | POA: Diagnosis not present

## 2016-09-09 DIAGNOSIS — E669 Obesity, unspecified: Secondary | ICD-10-CM | POA: Diagnosis not present

## 2016-09-09 DIAGNOSIS — F329 Major depressive disorder, single episode, unspecified: Secondary | ICD-10-CM | POA: Insufficient documentation

## 2016-09-09 DIAGNOSIS — I481 Persistent atrial fibrillation: Secondary | ICD-10-CM

## 2016-09-09 NOTE — Progress Notes (Signed)
Primary Care Physician: Cala Bradford, MD Referring Physician: Dr. Royden Purl is a 60 y.o. female with a h/o PAF, obesity, HTN, ambulatory issues due to severe fallen arch of left arch, in afib clinic today for f/u cardioversion set up by Dr. Johney Frame. Did shockl out but felt felt like she returned to afib 1-2 days ago. Has a feeling of uneasiness in her chest when in afib. Is sedentary due to ambulatory issues and does not really notice shortness of breath. Contributes fatigue to rasing three grandchildren. She would prefer to be in SR. On xarelto for chadsvasc score of 2. Is pending a sleep study for h/o snoring. Drinks 3-4 drinks of wine a week. No excessive caffeine. No smoking.  F/u in afib clinic 9/22, on last visit, it was decided to take multaq 400 mg bid and then proceed to repeat cardioversion, allowing drug to load x 3 days. She returns today after successful cardioversion and is staying in SR. She has noted for the last couple of days that she may be retaining some fluid. Weight is up 1 pound. Discussed waiting  to see if fluid retention continues, if so, allow for wash out and would start flecainide. She is in agreement. Does feel better in SR. No GI  Issues on drug.  Returned to see Gypsy Balsam, NP , 10/5 and still had fluid issues. HCTZ was restarted and LLE improved. Bmet was done and PCP and was ok. She has been maintaining SR and is tolerating Multaq. She is hoping that she can have surgery on her left foot in the next few months so she can be more active which would help her weight issues. Continues on xarelto.  Today, she denies symptoms of palpitations, chest pain, shortness of breath, orthopnea, PND, lower extremity edema, dizziness, presyncope, syncope, or neurologic sequela. The patient is tolerating medications without difficulties and is otherwise without complaint today.   Past Medical History:  Diagnosis Date  . Atrial fibrillation (HCC)   . Chronic  lower back pain   . Depression   . DVT (deep venous thrombosis) (HCC) 1990s   LLE  . Hypertension   . Hyperthyroidism ~ 2000   "fine now" (04/23/2016)  . Obesity   . Pneumonia 1980s X 1  . Snoring    has not had sleep study but suspects sleep apnea   Past Surgical History:  Procedure Laterality Date  . CARDIOVERSION N/A 06/18/2016   Procedure: CARDIOVERSION;  Surgeon: Laurey Morale, MD;  Location: [facility] ENDOSCOPY;  Service: Cardiovascular;  Laterality: N/A;  . CARDIOVERSION N/A 07/11/2016   Procedure: CARDIOVERSION;  Surgeon: Lewayne Bunting, MD;  Location: [facility] ENDOSCOPY;  Service: Cardiovascular;  Laterality: N/A;  . ENDOMETRIAL ABLATION  ~2006  . TONSILLECTOMY  1960s  . TUBAL LIGATION  1979    Current Outpatient Prescriptions  Medication Sig Dispense Refill  . amLODipine (NORVASC) 5 MG tablet Take 1 tablet by mouth daily.  0  . clonazePAM (KLONOPIN) 0.5 MG tablet Take 1 tablet by mouth 3 (three) times daily as needed for anxiety.   0  . cyanocobalamin 500 MCG tablet Take 500 mcg by mouth daily.    Marland Kitchen dronedarone (MULTAQ) 400 MG tablet Take 1 tablet (400 mg total) by mouth 2 (two) times daily with a meal. 60 tablet 6  . DULoxetine (CYMBALTA) 60 MG capsule Take 2 capsules by mouth daily.  0  . ibuprofen (ADVIL,MOTRIN) 200 MG tablet Take 200 mg by mouth as  needed.    Marland Kitchen. losartan-hydrochlorothiazide (HYZAAR) 100-25 MG tablet Take 1 tablet by mouth daily.    . pantoprazole (PROTONIX) 40 MG tablet Take 1 tablet (40 mg total) by mouth daily. 30 tablet 1  . traMADol (ULTRAM) 50 MG tablet Take 1 tablet by mouth 2 (two) times daily as needed (pain).   0  . XARELTO 20 MG TABS tablet Take 1 tablet by mouth daily.     No current facility-administered medications for this encounter.     Allergies  Allergen Reactions  . Penicillins Hives and Shortness Of Breath  . Codeine Hives    Social History   Social History  . Marital status: Married    Spouse name: TERRY  . Number of children: 2    . Years of education: N/A   Occupational History  . [employer] OFFICES    Social History Main Topics  . Smoking status: Former Smoker    Packs/day: 0.75    Years: 10.00    Types: Cigarettes  . Smokeless tobacco: Never Used     Comment: 04/23/2016 "stopped smoking cigarettes in ~ 1989"  . Alcohol use 2.4 oz/week    4 Glasses of wine per week     Comment: recently quit  . Drug use:     Types: Marijuana     Comment: 04/23/2016 "recreational marijuana in my late teens/early 20's"  . Sexual activity: Not Currently   Other Topics Concern  . Not on file   Social History Narrative   [place] and 3 grandchildren.   Self employed office cleaner.    Family History  Problem Relation Age of Onset  . Diabetes Mellitus I Father   . CVA Father   . Arthritis Mother   . Obesity Mother   . CAD Maternal Grandfather     ROS- All systems are reviewed and negative except as per the HPI above  Physical Exam: Vitals:   09/09/16 1050  BP: 132/76  Pulse: 64  Weight: 297 lb 12.8 oz (135.1 kg)  Height: 5\' 3"  (1.6 m)    GEN- The patient is well appearing, alert and oriented x 3 today.   Head- normocephalic, atraumatic Eyes-  Sclera clear, conjunctiva pink Ears- hearing intact Oropharynx- clear Neck- supple, no JVP Lymph- no cervical lymphadenopathy Lungs- Clear to ausculation bilaterally, normal work of breathing Heart-regular rate and rhythm, no murmurs, rubs or gallops, PMI not laterally displaced GI- soft, NT, ND, + BS Extremities- no clubbing, cyanosis, or trace to 1+ edema MS- no significant deformity or atrophy Skin- no rash or lesion Psych- euthymic mood, full affect Neuro- strength and sensation are intact  EKG- SR at 64 bpm, pr int 176 ms, qrs int 94 ms, qtc 431 ms Epic records reviewed Echo-Technically difficult study, Definity contrast administered. LVEF   60-65%, grossly normal wall motion, normal wall thickness, aortic   sclerosis with trivial  AI, moderate LAE, mild RAE, mild RVE with   mildly reduced RV function, normal IVC. Pharmacologic 2 day  myoview-  Perfusion: No decreased activity in the left ventricle on stress imaging to suggest reversible ischemia or infarction. Mild breast attenuation of the anterior wall.  Wall Motion: Normal left ventricular wall motion. No left ventricular dilation.  Left Ventricular Ejection Fraction: 54 %  End diastolic volume 114 ml  End systolic volume 52 ml  IMPRESSION: 1. No reversible ischemia or infarction.  2. Normal left ventricular wall motion.  3. Left ventricular ejection fraction 54%  4. Non  invasive risk stratification*: Low  EKG- in SR at 64 bpm, pr int 182 ms, qrs int 96 ms, qtc 410 ms  Creatinine-0.68 ms, K+4.3, NA 141  Assessment and Plan:  1. PAF Successful cardioversion but ERAF Loaded on multaq 500 mg bid x 3 days and then with repeat cardioversion, which was successful. Continue multaq Continue with hctz  2. Obseity Offered free diet class  Difficulty with exercise due to foot issues that she plans to have surgery in the next 1-2 years  3. Snoring Pending sleep study  4. HTN Well managed  F/u 4 months afib clinic  Lupita LeashDonna C. Matthew Folksarroll, ANP-C Afib Clinic [facility] [address] [place], [place] [address] [phone]

## 2016-09-09 NOTE — Patient Instructions (Signed)
[email or web address]

## 2017-01-07 ENCOUNTER — Ambulatory Visit (HOSPITAL_COMMUNITY)
Admission: RE | Admit: 2017-01-07 | Discharge: 2017-01-07 | Disposition: A | Discharge status: Home / Self Care | Payer: Managed Care, Other (non HMO) | Source: Ambulatory Visit | Attending: Nurse Practitioner | Admitting: Nurse Practitioner

## 2017-01-07 ENCOUNTER — Encounter (HOSPITAL_COMMUNITY): Payer: Self-pay | Admitting: Nurse Practitioner

## 2017-01-07 VITALS — BP 130/78 | HR 69 | Ht 63.0 in | Wt 294.6 lb

## 2017-01-07 DIAGNOSIS — E669 Obesity, unspecified: Secondary | ICD-10-CM | POA: Diagnosis not present

## 2017-01-07 DIAGNOSIS — I480 Paroxysmal atrial fibrillation: Secondary | ICD-10-CM | POA: Diagnosis present

## 2017-01-07 DIAGNOSIS — R0683 Snoring: Secondary | ICD-10-CM | POA: Insufficient documentation

## 2017-01-07 DIAGNOSIS — Z86718 Personal history of other venous thrombosis and embolism: Secondary | ICD-10-CM | POA: Insufficient documentation

## 2017-01-07 DIAGNOSIS — I10 Essential (primary) hypertension: Secondary | ICD-10-CM | POA: Diagnosis not present

## 2017-01-07 DIAGNOSIS — Z87891 Personal history of nicotine dependence: Secondary | ICD-10-CM | POA: Diagnosis not present

## 2017-01-07 NOTE — Progress Notes (Signed)
Primary Care Physician: Abigail BradfordWHITE,CYNTHIA S, MD Referring Physician: Dr. Royden PurlAllred   [name] is a 61 y.o. female with a h/o PAF, obesity, HTN, ambulatory issues due to severe fallen arch of left arch, in afib clinic today for f/u cardioversion set up by Dr. Johney FrameAllred. Did shockl out but felt felt like she returned to afib 1-2 days ago. Has a feeling of uneasiness in her chest when in afib. Is sedentary due to ambulatory issues and does not really notice shortness of breath. Contributes fatigue to rasing three grandchildren. She would prefer to be in SR. On xarelto for chadsvasc score of 2. Is pending a sleep study for h/o snoring. Drinks 3-4 drinks of wine a week. No excessive caffeine. No smoking.  F/u in afib clinic 9/22, on last visit, it was decided to take multaq 400 mg bid and then proceed to repeat cardioversion, allowing drug to load x 3 days. She returns today after successful cardioversion and is staying in SR. She has noted for the last couple of days that she may be retaining some fluid. Weight is up 1 pound. Discussed waiting  to see if fluid retention continues, if so, allow for wash out and would start flecainide. She is in agreement. Does feel better in SR. No GI  Issues on drug.  Returned to see [name], NP , 10/5 and still had fluid issues. HCTZ was restarted and LLE improved. Bmet was done and PCP and was ok. She has been maintaining SR and is tolerating Multaq. She is hoping that she can have surgery on her left foot in the next few months so she can be more active which would help her weight issues. Continues on xarelto.  F/u in afib clinic 3/19. She is staying in SR. She continues on multaq and xarelto. She is hoping to lose weight and plans to start a meal replacement program. PharmD student screened a sample of the meal replacement package and did not see any issues with ingredients.  Today, she denies symptoms of palpitations, chest pain, shortness of breath,  orthopnea, PND, lower extremity edema, dizziness, presyncope, syncope, or neurologic sequela. The patient is tolerating medications without difficulties and is otherwise without complaint today.   Past Medical History:  Diagnosis Date  . Atrial fibrillation (HCC)   . Chronic lower back pain   . Depression   . DVT (deep venous thrombosis) (HCC) 1990s   LLE  . Hypertension   . Hyperthyroidism ~ 2000   "fine now" (04/23/2016)  . Obesity   . Pneumonia 1980s X 1  . Snoring    has not had sleep study but suspects sleep apnea   Past Surgical History:  Procedure Laterality Date  . CARDIOVERSION N/A 06/18/2016   Procedure: CARDIOVERSION;  Surgeon: Laurey Moralealton Koch McLean, MD;  [facility];  Service: Cardiovascular;  Laterality: N/A;  . CARDIOVERSION N/A 07/11/2016   Procedure: CARDIOVERSION;  Surgeon: Lewayne BuntingBrian Koch Crenshaw, MD;  [facility];  Service: Cardiovascular;  Laterality: N/A;  . ENDOMETRIAL ABLATION  ~2006  . TONSILLECTOMY  1960s  . TUBAL LIGATION  1979    Current Outpatient Prescriptions  Medication Sig Dispense Refill  . amLODipine (NORVASC) 5 MG tablet Take 1 tablet by mouth daily.  0  . clonazePAM (KLONOPIN) 0.5 MG tablet Take 1 tablet by mouth 3 (three) times daily as needed for anxiety.   0  . cyanocobalamin 500 MCG tablet Take 500 mcg by mouth daily.    Marland Kitchen. dronedarone (MULTAQ)  400 MG tablet Take 1 tablet (400 mg total) by mouth 2 (two) times daily with a meal. 60 tablet 6  . DULoxetine (CYMBALTA) 60 MG capsule Take 2 capsules by mouth daily.  0  . ibuprofen (ADVIL,MOTRIN) 200 MG tablet Take 200 mg by mouth as needed.    Marland Kitchen losartan-hydrochlorothiazide (HYZAAR) 100-25 MG tablet Take 1 tablet by mouth daily.    . pantoprazole (PROTONIX) 40 MG tablet Take 1 tablet (40 mg total) by mouth daily. 30 tablet 1  . traMADol (ULTRAM) 50 MG tablet Take 1 tablet by mouth 2 (two) times daily as needed (pain).   0  . XARELTO 20 MG TABS tablet Take 1 tablet by mouth daily.     No  current facility-administered medications for this encounter.     Allergies  Allergen Reactions  . Penicillins Hives and Shortness Of Breath  . Codeine Hives    Social History   Social History  . Marital status: Married    Spouse name: TERRY  . Number of children: 2  . Years of education: N/A   Occupational History  . CLE[employer] OFFICES    Social History Main Topics  . Smoking status: Former Smoker    Packs/day: 0.75    Years: 10.00    Types: Cigarettes  . Smokeless tobacco: Never Used     Comment: 04/23/2016 "stopped smoking cigarettes in ~ 1989"  . Alcohol use 2.4 oz/week    4 Glasses of wine per week     Comment: recently quit  . Drug use: Yes    Types: Marijuana     Comment: 04/23/2016 "recreational marijuana in my late teens/early 20'Koch"  . Sexual activity: Not Currently   Other Topics Concern  . Not on file   Social History Narrative   Lives in [place] with husband and 3 grandchildren.   Self employed office cleaner.    Family History  Problem Relation Age of Onset  . Diabetes Mellitus I Father   . CVA Father   . Arthritis Mother   . Obesity Mother   . CAD Maternal Grandfather     ROS- All systems are reviewed and negative except as per the HPI above  Physical Exam: Vitals:   01/07/17 1032  BP: 130/78  Pulse: 69  Weight: 294 lb 9.6 oz (133.6 kg)  Height: 5\' 3"  (1.6 m)    GEN- The patient is well appearing, alert and oriented x 3 today.   Head- normocephalic, atraumatic Eyes-  Sclera clear, conjunctiva pink Ears- hearing intact Oropharynx- clear Neck- supple, no JVP Lymph- no cervical lymphadenopathy Lungs- Clear to ausculation bilaterally, normal work of breathing Heart-regular rate and rhythm, no murmurs, rubs or gallops, PMI not laterally displaced GI- soft, NT, ND, + BS Extremities- no clubbing, cyanosis, or trace to 1+ edema MS- no significant deformity or atrophy Skin- no rash or lesion Psych- euthymic mood, full affect Neuro-  strength and sensation are intact  EKG- SR at 69 bpm, pr int 184 ms, qrs int 96 ms, qtc 445 ms Epic records reviewed Echo-Technically difficult study, Definity contrast administered. LVEF   60-65%, grossly normal wall motion, normal wall thickness, aortic   sclerosis with trivial AI, moderate LAE, mild RAE, mild RVE with   mildly reduced RV function, normal IVC. Pharmacologic 2 day  myoview-  Perfusion: No decreased activity in the left ventricle on stress imaging to suggest reversible ischemia or infarction. Mild breast attenuation of the anterior wall.  Wall Motion: Normal left ventricular wall motion.  No left ventricular dilation.  Left Ventricular Ejection Fraction: 54 %  End diastolic volume 114 ml  End systolic volume 52 ml  IMPRESSION: 1. No reversible ischemia or infarction.  2. Normal left ventricular wall motion.  3. Left ventricular ejection fraction 54%  4. Non invasive risk stratification*: Low  EKG- in SR at 64 bpm, pr int 182 ms, qrs int 96 ms, qtc 410 ms  Creatinine-0.68 ms, K+4.3, NA 141  Assessment and Plan:  1. PAF In SR Continue multaq Continue xarelto 20 mg qd  2. Obseity Offered free diet class  Difficulty with exercise due to foot issues that she plans to have surgery in the next 1-2 years Plans to start a meal replacement plan x one meal a day  3. Snoring Pending sleep study  4. HTN Well managed    F/u 6 months afib clinic  Lupita Leash C. Matthew Folks [facility] [address] [place], [place] [address] [phone]

## 2017-02-17 ENCOUNTER — Other Ambulatory Visit (HOSPITAL_COMMUNITY): Payer: Self-pay | Admitting: Nurse Practitioner

## 2017-06-12 ENCOUNTER — Ambulatory Visit (HOSPITAL_COMMUNITY)
Admission: RE | Admit: 2017-06-12 | Discharge: 2017-06-12 | Disposition: A | Discharge status: Home / Self Care | Payer: Managed Care, Other (non HMO) | Source: Ambulatory Visit | Attending: Nurse Practitioner | Admitting: Nurse Practitioner

## 2017-06-12 ENCOUNTER — Encounter (HOSPITAL_COMMUNITY): Payer: Self-pay | Admitting: Nurse Practitioner

## 2017-06-12 VITALS — BP 128/76 | HR 78 | Ht 63.0 in | Wt 299.8 lb

## 2017-06-12 DIAGNOSIS — Z6841 Body Mass Index (BMI) 40.0 and over, adult: Secondary | ICD-10-CM | POA: Diagnosis not present

## 2017-06-12 DIAGNOSIS — Z79899 Other long term (current) drug therapy: Secondary | ICD-10-CM | POA: Diagnosis not present

## 2017-06-12 DIAGNOSIS — F329 Major depressive disorder, single episode, unspecified: Secondary | ICD-10-CM | POA: Diagnosis not present

## 2017-06-12 DIAGNOSIS — Z880 Allergy status to penicillin: Secondary | ICD-10-CM | POA: Diagnosis not present

## 2017-06-12 DIAGNOSIS — E669 Obesity, unspecified: Secondary | ICD-10-CM | POA: Diagnosis not present

## 2017-06-12 DIAGNOSIS — I480 Paroxysmal atrial fibrillation: Secondary | ICD-10-CM | POA: Diagnosis not present

## 2017-06-12 DIAGNOSIS — Z87891 Personal history of nicotine dependence: Secondary | ICD-10-CM | POA: Diagnosis not present

## 2017-06-12 DIAGNOSIS — I10 Essential (primary) hypertension: Secondary | ICD-10-CM | POA: Insufficient documentation

## 2017-06-12 DIAGNOSIS — Z7901 Long term (current) use of anticoagulants: Secondary | ICD-10-CM | POA: Insufficient documentation

## 2017-06-12 DIAGNOSIS — R0683 Snoring: Secondary | ICD-10-CM | POA: Diagnosis not present

## 2017-06-12 NOTE — Progress Notes (Signed)
Primary Care Physician: Laurann Montana, MD Referring Physician: Dr. Royden Purl is a 61 y.o. female with a h/o PAF, obesity, HTN, ambulatory issues due to severe fallen arch of left arch, in afib clinic today for f/u cardioversion set up by Dr. Johney Frame. Did shockl out but felt felt like she returned to afib 1-2 days ago. Has a feeling of uneasiness in her chest when in afib. Is sedentary due to ambulatory issues and does not really notice shortness of breath. Contributes fatigue to rasing three grandchildren. She would prefer to be in SR. On xarelto for chadsvasc score of 2. Is pending a sleep study for h/o snoring. Drinks 3-4 drinks of wine a week. No excessive caffeine. No smoking.  F/u in afib clinic 9/22, on last visit, it was decided to take multaq 400 mg bid and then proceed to repeat cardioversion, allowing drug to load x 3 days. She returns today after successful cardioversion and is staying in SR. She has noted for the last couple of days that she may be retaining some fluid. Weight is up 1 pound. Discussed waiting  to see if fluid retention continues, if so, allow for wash out and would start flecainide. She is in agreement. Does feel better in SR. No GI  Issues on drug.  Returned to see Gypsy Balsam, NP , 10/5 and still had fluid issues. HCTZ was restarted and LLE improved. Bmet was done and PCP and was ok. She has been maintaining SR and is tolerating Multaq. She is hoping that she can have surgery on her left foot in the next few months so she can be more active which would help her weight issues. Continues on xarelto.  F/u in afib clinic 3/19. She is staying in SR. She continues on multaq and xarelto. She is hoping to lose weight and plans to start a meal replacement program. PharmD student screened a sample of the meal replacement package and did not see any issues with ingredients.  F/u in afib clinic, 8/17, she is in afib clinic for return of Afib since last  Saturday or Sunday. She is rate controlled. She is under a lot of stress from raising 3 grandchildren and her mother being sic and in the hospital last weekend. In fact pt missed her dose of xarelto, Saturday pm. She has not been as consistent with the timing of her xarelto with all the issues going on. She has had almost one year of SR on multaq.  Today, she denies symptoms of palpitations, chest pain, shortness of breath, orthopnea, PND, lower extremity edema, dizziness, presyncope, syncope, or neurologic sequela. The patient is tolerating medications without difficulties and is otherwise without complaint today.   Past Medical History:  Diagnosis Date  . Atrial fibrillation (HCC)   . Chronic lower back pain   . Depression   . DVT (deep venous thrombosis) (HCC) 1990s   LLE  . Hypertension   . Hyperthyroidism ~ 2000   "fine now" (04/23/2016)  . Obesity   . Pneumonia 1980s X 1  . Snoring    has not had sleep study but suspects sleep apnea   Past Surgical History:  Procedure Laterality Date  . CARDIOVERSION N/A 06/18/2016   Procedure: CARDIOVERSION;  Surgeon: Laurey Morale, MD;  Location: [facility] ENDOSCOPY;  Service: Cardiovascular;  Laterality: N/A;  . CARDIOVERSION N/A 07/11/2016   Procedure: CARDIOVERSION;  Surgeon: Lewayne Bunting, MD;  Location: [facility] ENDOSCOPY;  Service: Cardiovascular;  Laterality: N/A;  .  ENDOMETRIAL ABLATION  ~2006  . TONSILLECTOMY  1960s  . TUBAL LIGATION  1979    Current Outpatient Prescriptions  Medication Sig Dispense Refill  . amLODipine (NORVASC) 5 MG tablet Take 1 tablet by mouth daily.  0  . clonazePAM (KLONOPIN) 0.5 MG tablet Take 1 tablet by mouth 3 (three) times daily as needed for anxiety.   0  . DULoxetine (CYMBALTA) 30 MG capsule Take 90 mg by mouth daily.    Marland Kitchen ibuprofen (ADVIL,MOTRIN) 200 MG tablet Take 200 mg by mouth as needed.    Marland Kitchen losartan-hydrochlorothiazide (HYZAAR) 100-25 MG tablet Take 1 tablet by mouth daily.    . MULTAQ 400 MG tablet take  1 tablet by mouth twice a day with meals 60 tablet 6  . pantoprazole (PROTONIX) 40 MG tablet Take 1 tablet (40 mg total) by mouth daily. 30 tablet 1  . traMADol (ULTRAM) 50 MG tablet Take 1 tablet by mouth 2 (two) times daily as needed (pain).   0  . XARELTO 20 MG TABS tablet Take 1 tablet by mouth daily.     No current facility-administered medications for this encounter.     Allergies  Allergen Reactions  . Penicillins Hives and Shortness Of Breath  . Codeine Hives    Social History   Social History  . Marital status: Married    Spouse name: TERRY  . Number of children: 2  . Years of education: N/A   Occupational History  . [employer] OFFICES    Social History Main Topics  . Smoking status: Former Smoker    Packs/day: 0.75    Years: 10.00    Types: Cigarettes  . Smokeless tobacco: Never Used     Comment: 04/23/2016 "stopped smoking cigarettes in ~ 1989"  . Alcohol use 2.4 oz/week    4 Glasses of wine per week     Comment: recently quit  . Drug use: Yes    Types: Marijuana     Comment: 04/23/2016 "recreational marijuana in my late teens/early 20's"  . Sexual activity: Not Currently   Other Topics Concern  . Not on file   Social History Narrative   Lives in [place] with husband and 3 grandchildren.   Self employed office cleaner.    Family History  Problem Relation Age of Onset  . Diabetes Mellitus I Father   . CVA Father   . Arthritis Mother   . Obesity Mother   . CAD Maternal Grandfather     ROS- All systems are reviewed and negative except as per the HPI above  Physical Exam: Vitals:   06/12/17 0841  BP: 128/76  Pulse: 78  Weight: 299 lb 12.8 oz (136 kg)  Height: 5\' 3"  (1.6 m)    GEN- The patient is well appearing, alert and oriented x 3 today.   Head- normocephalic, atraumatic Eyes-  Sclera clear, conjunctiva pink Ears- hearing intact Oropharynx- clear Neck- supple, no JVP Lymph- no cervical lymphadenopathy Lungs- Clear to ausculation  bilaterally, normal work of breathing Heart-slow iregular rate and rhythm, no murmurs, rubs or gallops, PMI not laterally displaced GI- soft, NT, ND, + BS Extremities- no clubbing, cyanosis, or trace to 1+ edema MS- no significant deformity or atrophy Skin- no rash or lesion Psych- euthymic mood, full affect Neuro- strength and sensation are intact  EKG- afib at 78 , qrs int 92 ms, qtc 430 ms Epic records reviewed Echo-Technically difficult study, Definity contrast administered. LVEF   60-65%, grossly normal wall motion, normal wall thickness, aortic  sclerosis with trivial AI, moderate LAE, mild RAE, mild RVE with   mildly reduced RV function, normal IVC. Pharmacologic 2 day  myoview-  Perfusion: No decreased activity in the left ventricle on stress imaging to suggest reversible ischemia or infarction. Mild breast attenuation of the anterior wall.  Wall Motion: Normal left ventricular wall motion. No left ventricular dilation.  Left Ventricular Ejection Fraction: 54 %  End diastolic volume 114 ml  End systolic volume 52 ml  IMPRESSION: 1. No reversible ischemia or infarction.  2. Normal left ventricular wall motion.  3. Left ventricular ejection fraction 54%  4. Non invasive risk stratification*: Low  EKG- in SR at 64 bpm, pr int 182 ms, qrs int 96 ms, qtc 410 ms  Creatinine-0.68 ms, K+4.3, NA 141  Assessment and Plan:  1. PAF Back in afib x 5-7 days Discussed repeating cardioversion on multaq or changing to another antiarrythmic, ie , flecainide She feels that the stress is really the factor and would not want to change antiarrythmic at this time Continue xarelto 20 mg qd, however, she did miss one dose, so will have to wait 3 weeks uninterrupted xarleto to cardiovert If ERAF, will definitely need to switch to a stronger AAD She will try and take multaq more regularly  2. Obseity Encouraged weight loss  3. Snoring Failed to have sleep study, will  have to discuss on f/u, rescheduling  4. HTN Well managed    F/u 2 weeks to get set up for cardioversion  Lupita Leash C. Matthew Folks [facility] [address] [place], [place] [address] [phone]

## 2017-06-26 ENCOUNTER — Encounter (HOSPITAL_COMMUNITY): Payer: Self-pay | Admitting: Nurse Practitioner

## 2017-06-26 ENCOUNTER — Ambulatory Visit (HOSPITAL_COMMUNITY)
Admission: RE | Admit: 2017-06-26 | Discharge: 2017-06-26 | Disposition: A | Discharge status: Home / Self Care | Payer: Managed Care, Other (non HMO) | Source: Ambulatory Visit | Attending: Nurse Practitioner | Admitting: Nurse Practitioner

## 2017-06-26 VITALS — BP 124/76 | HR 73 | Ht 63.0 in | Wt 289.0 lb

## 2017-06-26 DIAGNOSIS — Z833 Family history of diabetes mellitus: Secondary | ICD-10-CM | POA: Insufficient documentation

## 2017-06-26 DIAGNOSIS — Z86718 Personal history of other venous thrombosis and embolism: Secondary | ICD-10-CM | POA: Insufficient documentation

## 2017-06-26 DIAGNOSIS — E669 Obesity, unspecified: Secondary | ICD-10-CM | POA: Insufficient documentation

## 2017-06-26 DIAGNOSIS — Z9851 Tubal ligation status: Secondary | ICD-10-CM | POA: Insufficient documentation

## 2017-06-26 DIAGNOSIS — I10 Essential (primary) hypertension: Secondary | ICD-10-CM | POA: Insufficient documentation

## 2017-06-26 DIAGNOSIS — F329 Major depressive disorder, single episode, unspecified: Secondary | ICD-10-CM | POA: Insufficient documentation

## 2017-06-26 DIAGNOSIS — Z7902 Long term (current) use of antithrombotics/antiplatelets: Secondary | ICD-10-CM | POA: Insufficient documentation

## 2017-06-26 DIAGNOSIS — I4819 Other persistent atrial fibrillation: Secondary | ICD-10-CM

## 2017-06-26 DIAGNOSIS — Z823 Family history of stroke: Secondary | ICD-10-CM | POA: Diagnosis not present

## 2017-06-26 DIAGNOSIS — Z9889 Other specified postprocedural states: Secondary | ICD-10-CM | POA: Insufficient documentation

## 2017-06-26 DIAGNOSIS — Z8261 Family history of arthritis: Secondary | ICD-10-CM | POA: Insufficient documentation

## 2017-06-26 DIAGNOSIS — Z885 Allergy status to narcotic agent status: Secondary | ICD-10-CM | POA: Diagnosis not present

## 2017-06-26 DIAGNOSIS — I481 Persistent atrial fibrillation: Secondary | ICD-10-CM

## 2017-06-26 DIAGNOSIS — E0590 Thyrotoxicosis, unspecified without thyrotoxic crisis or storm: Secondary | ICD-10-CM | POA: Diagnosis not present

## 2017-06-26 DIAGNOSIS — Z8249 Family history of ischemic heart disease and other diseases of the circulatory system: Secondary | ICD-10-CM | POA: Insufficient documentation

## 2017-06-26 DIAGNOSIS — R0683 Snoring: Secondary | ICD-10-CM | POA: Insufficient documentation

## 2017-06-26 DIAGNOSIS — I480 Paroxysmal atrial fibrillation: Secondary | ICD-10-CM | POA: Insufficient documentation

## 2017-06-26 DIAGNOSIS — Z880 Allergy status to penicillin: Secondary | ICD-10-CM | POA: Diagnosis not present

## 2017-06-26 DIAGNOSIS — Z87891 Personal history of nicotine dependence: Secondary | ICD-10-CM | POA: Insufficient documentation

## 2017-06-26 DIAGNOSIS — Z6841 Body Mass Index (BMI) 40.0 and over, adult: Secondary | ICD-10-CM | POA: Diagnosis not present

## 2017-06-26 LAB — COMPREHENSIVE METABOLIC PANEL
ALT: 15 U/L (ref 14–54)
AST: 22 U/L (ref 15–41)
Albumin: 3.6 g/dL (ref 3.5–5.0)
Alkaline Phosphatase: 50 U/L (ref 38–126)
Anion gap: 8 (ref 5–15)
BUN: 16 mg/dL (ref 6–20)
CO2: 25 mmol/L (ref 22–32)
Calcium: 8.9 mg/dL (ref 8.9–10.3)
Chloride: 104 mmol/L (ref 101–111)
Creatinine, Ser: 0.83 mg/dL (ref 0.44–1.00)
GFR calc Af Amer: >60 mL/min (ref >60)
GFR calc non Af Amer: >60 mL/min (ref >60)
Glucose, Bld: 84 mg/dL (ref 65–99)
Potassium: 3.6 mmol/L (ref 3.5–5.1)
Sodium: 137 mmol/L (ref 135–145)
Total Bilirubin: 0.6 mg/dL (ref 0.3–1.2)
Total Protein: 6.6 g/dL (ref 6.5–8.1)

## 2017-06-26 LAB — CBC
HCT: 42 % (ref 36.0–46.0)
Hemoglobin: 14 g/dL (ref 12.0–15.0)
MCH: 27.3 pg (ref 26.0–34.0)
MCHC: 33.3 g/dL (ref 30.0–36.0)
MCV: 82 fL (ref 78.0–100.0)
Platelets: 249 10*3/uL (ref 150–400)
RBC: 5.12 MIL/uL — ABNORMAL HIGH (ref 3.87–5.11)
RDW: 13.7 % (ref 11.5–15.5)
WBC: 5.1 10*3/uL (ref 4.0–10.5)

## 2017-06-26 LAB — MAGNESIUM: Magnesium: 1.9 mg/dL (ref 1.7–2.4)

## 2017-06-26 LAB — TSH: TSH: 4.949 u[IU]/mL — ABNORMAL HIGH (ref 0.350–4.500)

## 2017-06-26 NOTE — Patient Instructions (Signed)
Your cardioversion is scheduled for : 07/02/2017 [facility] and go to admitting at (time) 12:30 Do Not eat or drink anything after midnight the night prior to your procedure. Take all your medications with a sip of water prior to arrival. Do NOT miss any doses of your blood thinner. You will NOT be able to drive home after your procedure.   [facility]

## 2017-06-26 NOTE — Progress Notes (Addendum)
   Primary Care Physician: White, Cynthia, MD Referring Physician: Dr. [name] is a 61 y.o. female with a h/o PAF, obesity, HTN, ambulatory issues due to severe fallen arch of left arch, in afib clinic today for f/u cardioversion set up by Dr. Allred. Did shockl out but felt felt like she returned to afib 1-2 days ago. Has a feeling of uneasiness in her chest when in afib. Is sedentary due to ambulatory issues and does not really notice shortness of breath. Contributes fatigue to rasing three grandchildren. She would prefer to be in SR. On xarelto for chadsvasc score of 2. Is pending a sleep study for h/o snoring. Drinks 3-4 drinks of wine a week. No excessive caffeine. No smoking.  F/u in afib clinic 9/22, on last visit, it was decided to take multaq 400 mg bid and then proceed to repeat cardioversion, allowing drug to load x 3 days. She returns today after successful cardioversion and is staying in SR. She has noted for the last couple of days that she may be retaining some fluid. Weight is up 1 pound. Discussed waiting  to see if fluid retention continues, if so, allow for wash out and would start flecainide. She is in agreement. Does feel better in SR. No GI  Issues on drug.  Returned to see Amber Seiler, NP , 10/5 and still had fluid issues. HCTZ was restarted and LLE improved. Bmet was done and PCP and was ok. She has been maintaining SR and is tolerating Multaq. She is hoping that she can have surgery on her left foot in the next few months so she can be more active which would help her weight issues. Continues on xarelto.  F/u in afib clinic 3/19. She is staying in SR. She continues on multaq and xarelto. She is hoping to lose weight and plans to start a meal replacement program. PharmD student screened a sample of the meal replacement package and did not see any issues with ingredients.  F/u in afib clinic, 8/17, she is in afib clinic for return of Afib since last  Saturday or Sunday. She is rate controlled. She is under a lot of stress from raising 3 grandchildren and her mother being sick and in the hospital last weekend. In fact pt missed her dose of xarelto, Saturday pm. She has not been as consistent with the timing of her xarelto with all the issues going on. She has had almost one year of SR on multaq.  F/u in afib clinic 8/31, pt returns and is in afib. She has not missed any doses of xarelto since 8/11.She wants to pursue cardioversion.   Today, she denies symptoms of palpitations, chest pain, shortness of breath, orthopnea, PND, lower extremity edema, dizziness, presyncope, syncope, or neurologic sequela. The patient is tolerating medications without difficulties and is otherwise without complaint today.   Past Medical History:  Diagnosis Date  . Atrial fibrillation (HCC)   . Chronic lower back pain   . Depression   . DVT (deep venous thrombosis) (HCC) 1990s   LLE  . Hypertension   . Hyperthyroidism ~ 2000   "fine now" (04/23/2016)  . Obesity   . Pneumonia 1980s X 1  . Snoring    has not had sleep study but suspects sleep apnea   Past Surgical History:  Procedure Laterality Date  . CARDIOVERSION N/A 06/18/2016   Procedure: CARDIOVERSION;  Surgeon: Dalton S McLean, MD;  Location: [facility] ENDOSCOPY;  Service: Cardiovascular;    Laterality: N/A;  . CARDIOVERSION N/A 07/11/2016   Procedure: CARDIOVERSION;  Surgeon: Lewayne Bunting, MD;  Location: [facility] ENDOSCOPY;  Service: Cardiovascular;  Laterality: N/A;  . ENDOMETRIAL ABLATION  ~2006  . TONSILLECTOMY  1960s  . TUBAL LIGATION  1979    Current Outpatient Prescriptions  Medication Sig Dispense Refill  . amLODipine (NORVASC) 5 MG tablet Take 1 tablet by mouth daily.  0  . clonazePAM (KLONOPIN) 0.5 MG tablet Take 1 tablet by mouth 3 (three) times daily as needed for anxiety.   0  . DULoxetine (CYMBALTA) 30 MG capsule Take 90 mg by mouth daily.    Marland Kitchen ibuprofen (ADVIL,MOTRIN) 200 MG tablet Take 200 mg  by mouth as needed.    Marland Kitchen losartan-hydrochlorothiazide (HYZAAR) 100-25 MG tablet Take 1 tablet by mouth daily.    . MULTAQ 400 MG tablet take 1 tablet by mouth twice a day with meals 60 tablet 6  . pantoprazole (PROTONIX) 40 MG tablet Take 1 tablet (40 mg total) by mouth daily. 30 tablet 1  . traMADol (ULTRAM) 50 MG tablet Take 1 tablet by mouth 2 (two) times daily as needed (pain).   0  . XARELTO 20 MG TABS tablet Take 1 tablet by mouth daily.     No current facility-administered medications for this encounter.     Allergies  Allergen Reactions  . Penicillins Hives and Shortness Of Breath  . Codeine Hives    Social History   Social History  . Marital status: Married    Spouse name: TERRY  . Number of children: 2  . Years of education: N/A   Occupational History  . [employer] OFFICES    Social History Main Topics  . Smoking status: Former Smoker    Packs/day: 0.75    Years: 10.00    Types: Cigarettes  . Smokeless tobacco: Never Used     Comment: 04/23/2016 "stopped smoking cigarettes in ~ 1989"  . Alcohol use 2.4 oz/week    4 Glasses of wine per week     Comment: recently quit  . Drug use: Yes    Types: Marijuana     Comment: 04/23/2016 "recreational marijuana in my late teens/early 20's"  . Sexual activity: Not Currently   Other Topics Concern  . Not on file   Social History Narrative   Lives in [place] with husband and 3 grandchildren.   Self employed office cleaner.    Family History  Problem Relation Age of Onset  . Diabetes Mellitus I Father   . CVA Father   . Arthritis Mother   . Obesity Mother   . CAD Maternal Grandfather     ROS- All systems are reviewed and negative except as per the HPI above  Physical Exam: Vitals:   06/26/17 0830  BP: 124/76  Pulse: 73  Weight: 289 lb (131.1 kg)  Height:  (1.6 m)    GEN- The patient is well appearing, alert and oriented x 3 today.   Head- normocephalic, atraumatic Eyes-  Sclera clear, conjunctiva  pink Ears- hearing intact Oropharynx- clear Neck- supple, no JVP Lymph- no cervical lymphadenopathy Lungs- Clear to ausculation bilaterally, normal work of breathing Heart-slow irregular rate and rhythm, no murmurs, rubs or gallops, PMI not laterally displaced GI- soft, NT, ND, + BS Extremities- no clubbing, cyanosis, or trace to 1+ edema MS- no significant deformity or atrophy Skin- no rash or lesion Psych- euthymic mood, full affect Neuro- strength and sensation are intact  EKG- afib at 73 , qrs  int 96 ms, qtc 436 ms Epic records reviewed Echo-Technically difficult study, Definity contrast administered. LVEF   60-65%, grossly normal wall motion, normal wall thickness, aortic   sclerosis with trivial AI, moderate LAE, mild RAE, mild RVE with   mildly reduced RV function, normal IVC. Pharmacologic 2 day  myoview-  Perfusion: No decreased activity in the left ventricle on stress imaging to suggest reversible ischemia or infarction. Mild breast attenuation of the anterior wall.  Wall Motion: Normal left ventricular wall motion. No left ventricular dilation.  Left Ventricular Ejection Fraction: 54 %  End diastolic volume 114 ml  End systolic volume 52 ml  IMPRESSION: 1. No reversible ischemia or infarction.  2. Normal left ventricular wall motion.  3. Left ventricular ejection fraction 54%  4. Non invasive risk stratification*: Low   Assessment and Plan:  1. PAF Back in afib x 2 weeks Discussed repeating cardioversion on multaq or changing to another antiarrythmic, ie , flecainide She feels that the stress is really the factor and would not want to change antiarrythmic at this time Continue xarelto 20 mg qd, however, she did miss one dose, no recent missed doses and will be eligible for DCCV after 9/1. Scheduled for DCCV 9/6. If ERAF, will definitely need to switch to a stronger AAD She will try and take multaq more regularly Cmet,cbc,tsh,mag today  2.  Obseity Encouraged weight loss  3. Snoring Failed to have sleep study, wants to discuss with PCP when she sees her 9/5  4. HTN Well managed   F/u 2 weeks after cardioverion  Lupita Leashonna C. Matthew Folksarroll, ANP-C [facility] [address] [place], [place] [id number] [phone]

## 2017-06-30 ENCOUNTER — Telehealth (HOSPITAL_COMMUNITY): Payer: Self-pay | Admitting: *Deleted

## 2017-06-30 NOTE — Telephone Encounter (Signed)
-----   Message from Awilda MetroMegan E Supple, [facility] sent at 06/30/2017  1:05 PM EDT ----- Regarding: RE: ? xarelto Should be ok to continue with her cardioversion plan since she still got the dose in less than halfway before her next dose.  Thanks, Megan  ----- Message ----- From: Shona Simpsonarter, Stacy S, RN Sent: 06/30/2017  10:39 AM To: Levin BaconKelley M Auten, RPH, Awilda MetroMegan E Supple, RPH Subject: ? xarelto                                      Pt is scheduled for cardioversion on Thursday of this week -- she called this morning stating on Sunday night she usually takes her xarelto at 630pm -- she did not take it until 3am. Would this be considered a missed dose and need to push out cardioversion? Or is it within a time window that it is still safe for her to continue with current cardioversion plan. Thanks [employer] RN

## 2017-07-02 ENCOUNTER — Encounter (HOSPITAL_COMMUNITY): Payer: Self-pay | Admitting: *Deleted

## 2017-07-02 ENCOUNTER — Ambulatory Visit (HOSPITAL_COMMUNITY): Payer: Managed Care, Other (non HMO) | Admitting: Certified Registered Nurse Anesthetist

## 2017-07-02 ENCOUNTER — Ambulatory Visit (HOSPITAL_COMMUNITY)
Admission: RE | Admit: 2017-07-02 | Discharge: 2017-07-02 | Disposition: A | Discharge status: Home / Self Care | Payer: Managed Care, Other (non HMO) | Source: Ambulatory Visit | Attending: Cardiovascular Disease | Admitting: Cardiovascular Disease

## 2017-07-02 ENCOUNTER — Encounter (HOSPITAL_COMMUNITY)
Admission: RE | Disposition: A | Discharge status: Home / Self Care | Payer: Self-pay | Source: Ambulatory Visit | Attending: Cardiovascular Disease

## 2017-07-02 DIAGNOSIS — I481 Persistent atrial fibrillation: Secondary | ICD-10-CM | POA: Diagnosis not present

## 2017-07-02 DIAGNOSIS — F329 Major depressive disorder, single episode, unspecified: Secondary | ICD-10-CM | POA: Diagnosis not present

## 2017-07-02 DIAGNOSIS — I4891 Unspecified atrial fibrillation: Secondary | ICD-10-CM | POA: Diagnosis present

## 2017-07-02 DIAGNOSIS — I10 Essential (primary) hypertension: Secondary | ICD-10-CM | POA: Insufficient documentation

## 2017-07-02 DIAGNOSIS — Z87891 Personal history of nicotine dependence: Secondary | ICD-10-CM | POA: Diagnosis not present

## 2017-07-02 DIAGNOSIS — Z9109 Other allergy status, other than to drugs and biological substances: Secondary | ICD-10-CM | POA: Diagnosis not present

## 2017-07-02 DIAGNOSIS — Z880 Allergy status to penicillin: Secondary | ICD-10-CM | POA: Insufficient documentation

## 2017-07-02 DIAGNOSIS — Z7901 Long term (current) use of anticoagulants: Secondary | ICD-10-CM | POA: Diagnosis not present

## 2017-07-02 DIAGNOSIS — I480 Paroxysmal atrial fibrillation: Secondary | ICD-10-CM | POA: Insufficient documentation

## 2017-07-02 DIAGNOSIS — E669 Obesity, unspecified: Secondary | ICD-10-CM | POA: Insufficient documentation

## 2017-07-02 DIAGNOSIS — I4819 Other persistent atrial fibrillation: Secondary | ICD-10-CM

## 2017-07-02 DIAGNOSIS — Z79899 Other long term (current) drug therapy: Secondary | ICD-10-CM | POA: Insufficient documentation

## 2017-07-02 DIAGNOSIS — Z791 Long term (current) use of non-steroidal anti-inflammatories (NSAID): Secondary | ICD-10-CM | POA: Diagnosis not present

## 2017-07-02 HISTORY — PX: CARDIOVERSION: SHX1299

## 2017-07-02 SURGERY — CARDIOVERSION
Anesthesia: General

## 2017-07-02 MED ORDER — PROPOFOL 10 MG/ML IV BOLUS
INTRAVENOUS | Status: DC | PRN
  Administered 2017-07-02: 60 mg via INTRAVENOUS
  Administered 2017-07-02: 20 mg via INTRAVENOUS

## 2017-07-02 MED ORDER — SODIUM CHLORIDE 0.9 % IV SOLN
INTRAVENOUS | Status: DC
  Administered 2017-07-02: 13:00:00 via INTRAVENOUS

## 2017-07-02 MED ORDER — LIDOCAINE HCL (CARDIAC) 20 MG/ML IV SOLN
INTRAVENOUS | Status: DC | PRN
  Administered 2017-07-02: 60 mg via INTRAVENOUS

## 2017-07-02 NOTE — Interval H&P Note (Signed)
History and Physical Interval Note:  07/02/2017 12:36 PM  [name]  has presented today for surgery, with the diagnosis of A-FIB  The various methods of treatment have been discussed with the patient and family. After consideration of risks, benefits and other options for treatment, the patient has consented to  Procedure(s): CARDIOVERSION (N/A) as a surgical intervention .  The patient's history has been reviewed, patient examined, no change in status, stable for surgery.  I have reviewed the patient's chart and labs.  Questions were answered to the patient's satisfaction.     Mihai Croitoru

## 2017-07-02 NOTE — Discharge Instructions (Signed)
Electrical Cardioversion, Care After °This sheet gives you information about how to care for yourself after your procedure. Your health care provider may also give you more specific instructions. If you have problems or questions, contact your health care provider. °What can I expect after the procedure? °After the procedure, it is common to have: °· Some redness on the skin where the shocks were given. ° °Follow these instructions at home: °· Do not drive for 24 hours if you were given a medicine to help you relax (sedative). °· Take over-the-counter and prescription medicines only as told by your health care provider. °· Ask your health care provider how to check your pulse. Check it often. °· Rest for 48 hours after the procedure or as told by your health care provider. °· Avoid or limit your caffeine use as told by your health care provider. °Contact a health care provider if: °· You feel like your heart is beating too quickly or your pulse is not regular. °· You have a serious muscle cramp that does not go away. °Get help right away if: °· You have discomfort in your chest. °· You are dizzy or you feel faint. °· You have trouble breathing or you are short of breath. °· Your speech is slurred. °· You have trouble moving an arm or leg on one side of your body. °· Your fingers or toes turn cold or blue. °This information is not intended to replace advice given to you by your health care provider. Make sure you discuss any questions you have with your health care provider. °Document Released: 08/03/2013 Document Revised: 05/16/2016 Document Reviewed: 04/18/2016 °Elsevier Interactive Patient Education © 2018 [employer] ° °

## 2017-07-02 NOTE — Anesthesia Preprocedure Evaluation (Signed)
Anesthesia Evaluation  Patient identified by MRN, date of birth, ID band Patient awake    Reviewed: Allergy & Precautions, NPO status , Patient's Chart, lab work & pertinent test results  History of Anesthesia Complications Negative for: history of anesthetic complications  Airway Mallampati: II  TM Distance: >3 FB Neck ROM: Full    Dental  (+) Missing, Dental Advisory Given   Pulmonary former smoker,    breath sounds clear to auscultation       Cardiovascular hypertension, Pt. on medications (-) angina+ DVT  + dysrhythmias Atrial Fibrillation  Rhythm:Irregular Rate:Normal  6/17 ECHO: EF 60-65%   Neuro/Psych Depression    GI/Hepatic Neg liver ROS, GERD  Medicated and Controlled,  Endo/Other  Morbid obesity  Renal/GU negative Renal ROS     Musculoskeletal Chronic lower back pain   Abdominal (+) + obese,   Peds  Hematology  (+) Blood dyscrasia (xarelto), ,   Anesthesia Other Findings Super obese  Reproductive/Obstetrics                             Anesthesia Physical  Anesthesia Plan  ASA: IV  Anesthesia Plan: General   Post-op Pain Management:    Induction: Intravenous  PONV Risk Score and Plan: 3 and Propofol infusion and Treatment may vary due to age or medical condition  Airway Management Planned: Mask  Additional Equipment:   Intra-op Plan:   Post-operative Plan:   Informed Consent: I have reviewed the patients History and Physical, chart, labs and discussed the procedure including the risks, benefits and alternatives for the proposed anesthesia with the patient or authorized representative who has indicated his/her understanding and acceptance.   Dental advisory given  Plan Discussed with: CRNA and Surgeon  Anesthesia Plan Comments:         Anesthesia Quick Evaluation

## 2017-07-02 NOTE — Op Note (Signed)
Procedure: Electrical Cardioversion Indications:  Atrial Fibrillation  Procedure Details:  Consent: Risks of procedure as well as the alternatives and risks of each were explained to the (patient/caregiver).  Consent for procedure obtained.  Time Out: Verified patient identification, verified procedure, site/side was marked, verified correct patient position, special equipment/implants available, medications/allergies/relevent history reviewed, required imaging and test results available.  Performed  Patient placed on cardiac monitor, pulse oximetry, supplemental oxygen as necessary.  Sedation given: Propofol 80 mg IV Pacer pads placed anterior and posterior chest.  Cardioverted 2 time(s).  Cardioversion with synchronized biphasic 120J shock (unsuccessful). Second shock at 200J converted to NSR, but returned to atrial fibrillation after about 60 seconds.  Evaluation: Findings: Post procedure EKG shows: Atrial Fibrillation Complications: None Patient did tolerate procedure well. Consider alternative antiarrhythmic therapy.  Time Spent Directly with the Patient:  30 minutes   Mihai Croitoru 07/02/2017, 1:00 PM

## 2017-07-02 NOTE — H&P (View-Only) (Signed)
Primary Care Physician: Laurann Montana, MD Referring Physician: Dr. Royden Purl is a 61 y.o. female with a h/o PAF, obesity, HTN, ambulatory issues due to severe fallen arch of left arch, in afib clinic today for f/u cardioversion set up by Dr. Johney Frame. Did shockl out but felt felt like she returned to afib 1-2 days ago. Has a feeling of uneasiness in her chest when in afib. Is sedentary due to ambulatory issues and does not really notice shortness of breath. Contributes fatigue to rasing three grandchildren. She would prefer to be in SR. On xarelto for chadsvasc score of 2. Is pending a sleep study for h/o snoring. Drinks 3-4 drinks of wine a week. No excessive caffeine. No smoking.  F/u in afib clinic 9/22, on last visit, it was decided to take multaq 400 mg bid and then proceed to repeat cardioversion, allowing drug to load x 3 days. She returns today after successful cardioversion and is staying in SR. She has noted for the last couple of days that she may be retaining some fluid. Weight is up 1 pound. Discussed waiting  to see if fluid retention continues, if so, allow for wash out and would start flecainide. She is in agreement. Does feel better in SR. No GI  Issues on drug.  Returned to see Gypsy Balsam, NP , 10/5 and still had fluid issues. HCTZ was restarted and LLE improved. Bmet was done and PCP and was ok. She has been maintaining SR and is tolerating Multaq. She is hoping that she can have surgery on her left foot in the next few months so she can be more active which would help her weight issues. Continues on xarelto.  F/u in afib clinic 3/19. She is staying in SR. She continues on multaq and xarelto. She is hoping to lose weight and plans to start a meal replacement program. PharmD student screened a sample of the meal replacement package and did not see any issues with ingredients.  F/u in afib clinic, 8/17, she is in afib clinic for return of Afib since last  Saturday or Sunday. She is rate controlled. She is under a lot of stress from raising 3 grandchildren and her mother being sick and in the hospital last weekend. In fact pt missed her dose of xarelto, Saturday pm. She has not been as consistent with the timing of her xarelto with all the issues going on. She has had almost one year of SR on multaq.  F/u in afib clinic 8/31, pt returns and is in afib. She has not missed any doses of xarelto since 8/11.She wants to pursue cardioversion.   Today, she denies symptoms of palpitations, chest pain, shortness of breath, orthopnea, PND, lower extremity edema, dizziness, presyncope, syncope, or neurologic sequela. The patient is tolerating medications without difficulties and is otherwise without complaint today.   Past Medical History:  Diagnosis Date  . Atrial fibrillation (HCC)   . Chronic lower back pain   . Depression   . DVT (deep venous thrombosis) (HCC) 1990s   LLE  . Hypertension   . Hyperthyroidism ~ 2000   "fine now" (04/23/2016)  . Obesity   . Pneumonia 1980s X 1  . Snoring    has not had sleep study but suspects sleep apnea   Past Surgical History:  Procedure Laterality Date  . CARDIOVERSION N/A 06/18/2016   Procedure: CARDIOVERSION;  Surgeon: Laurey Morale, MD;  Location: [facility] ENDOSCOPY;  Service: Cardiovascular;  Laterality: N/A;  . CARDIOVERSION N/A 07/11/2016   Procedure: CARDIOVERSION;  Surgeon: Lewayne Bunting, MD;  Location: [facility] ENDOSCOPY;  Service: Cardiovascular;  Laterality: N/A;  . ENDOMETRIAL ABLATION  ~2006  . TONSILLECTOMY  1960s  . TUBAL LIGATION  1979    Current Outpatient Prescriptions  Medication Sig Dispense Refill  . amLODipine (NORVASC) 5 MG tablet Take 1 tablet by mouth daily.  0  . clonazePAM (KLONOPIN) 0.5 MG tablet Take 1 tablet by mouth 3 (three) times daily as needed for anxiety.   0  . DULoxetine (CYMBALTA) 30 MG capsule Take 90 mg by mouth daily.    Marland Kitchen ibuprofen (ADVIL,MOTRIN) 200 MG tablet Take 200 mg  by mouth as needed.    Marland Kitchen losartan-hydrochlorothiazide (HYZAAR) 100-25 MG tablet Take 1 tablet by mouth daily.    . MULTAQ 400 MG tablet take 1 tablet by mouth twice a day with meals 60 tablet 6  . pantoprazole (PROTONIX) 40 MG tablet Take 1 tablet (40 mg total) by mouth daily. 30 tablet 1  . traMADol (ULTRAM) 50 MG tablet Take 1 tablet by mouth 2 (two) times daily as needed (pain).   0  . XARELTO 20 MG TABS tablet Take 1 tablet by mouth daily.     No current facility-administered medications for this encounter.     Allergies  Allergen Reactions  . Penicillins Hives and Shortness Of Breath  . Codeine Hives    Social History   Social History  . Marital status: Married    Spouse name: TERRY  . Number of children: 2  . Years of education: N/A   Occupational History  . [employer]ICES    Social History Main Topics  . Smoking status: Former Smoker    Packs/day: 0.75    Years: 10.00    Types: Cigarettes  . Smokeless tobacco: Never Used     Comment: 04/23/2016 "stopped smoking cigarettes in ~ 1989"  . Alcohol use 2.4 oz/week    4 Glasses of wine per week     Comment: recently quit  . Drug use: Yes    Types: Marijuana     Comment: 04/23/2016 "recreational marijuana in my late teens/early 20's"  . Sexual activity: Not Currently   Other Topics Concern  . Not on file   Social History Narrative   Lives in [place] with husband and 3 grandchildren.   Self employed office cleaner.    Family History  Problem Relation Age of Onset  . Diabetes Mellitus I Father   . CVA Father   . Arthritis Mother   . Obesity Mother   . CAD Maternal Grandfather     ROS- All systems are reviewed and negative except as per the HPI above  Physical Exam: Vitals:   06/26/17 0830  BP: 124/76  Pulse: 73  Weight: 289 lb (131.1 kg)  Height:  (1.6 m)    GEN- The patient is well appearing, alert and oriented x 3 today.   Head- normocephalic, atraumatic Eyes-  Sclera clear, conjunctiva  pink Ears- hearing intact Oropharynx- clear Neck- supple, no JVP Lymph- no cervical lymphadenopathy Lungs- Clear to ausculation bilaterally, normal work of breathing Heart-slow irregular rate and rhythm, no murmurs, rubs or gallops, PMI not laterally displaced GI- soft, NT, ND, + BS Extremities- no clubbing, cyanosis, or trace to 1+ edema MS- no significant deformity or atrophy Skin- no rash or lesion Psych- euthymic mood, full affect Neuro- strength and sensation are intact  EKG- afib at 73 , qrs  int 96 ms, qtc 436 ms Epic records reviewed Echo-Technically difficult study, Definity contrast administered. LVEF   60-65%, grossly normal wall motion, normal wall thickness, aortic   sclerosis with trivial AI, moderate LAE, mild RAE, mild RVE with   mildly reduced RV function, normal IVC. Pharmacologic 2 day  myoview-  Perfusion: No decreased activity in the left ventricle on stress imaging to suggest reversible ischemia or infarction. Mild breast attenuation of the anterior wall.  Wall Motion: Normal left ventricular wall motion. No left ventricular dilation.  Left Ventricular Ejection Fraction: 54 %  End diastolic volume 114 ml  End systolic volume 52 ml  IMPRESSION: 1. No reversible ischemia or infarction.  2. Normal left ventricular wall motion.  3. Left ventricular ejection fraction 54%  4. Non invasive risk stratification*: Low   Assessment and Plan:  1. PAF Back in afib x 2 weeks Discussed repeating cardioversion on multaq or changing to another antiarrythmic, ie , flecainide She feels that the stress is really the factor and would not want to change antiarrythmic at this time Continue xarelto 20 mg qd, however, she did miss one dose, no recent missed doses and will be eligible for DCCV after 9/1. Scheduled for DCCV 9/6. If ERAF, will definitely need to switch to a stronger AAD She will try and take multaq more regularly Cmet,cbc,tsh,mag today  2.  Obseity Encouraged weight loss  3. Snoring Failed to have sleep study, wants to discuss with PCP when she sees her 9/5  4. HTN Well managed   F/u 2 weeks after cardioverion  Lupita Leashonna C. Matthew Folksarroll, ANP-C [facility] [address] [place], [place] [id number] [phone]

## 2017-07-02 NOTE — Anesthesia Postprocedure Evaluation (Signed)
Anesthesia Post Note  Patient: [name]  Procedure(s) Performed: Procedure(s) (LRB): CARDIOVERSION (N/A)     Patient location during evaluation: PACU Anesthesia Type: General Level of consciousness: awake and alert Pain management: pain level controlled Vital Signs Assessment: post-procedure vital signs reviewed and stable Respiratory status: spontaneous breathing, nonlabored ventilation, respiratory function stable and patient connected to nasal cannula oxygen Cardiovascular status: blood pressure returned to baseline and stable Postop Assessment: no signs of nausea or vomiting Anesthetic complications: no    Last Vitals:  Vitals:   07/02/17 1315 07/02/17 1325  BP: 105/77 118/67  Pulse: 68   Resp: 16 20  Temp:    SpO2: 94% 92%    Last Pain:  Vitals:   07/02/17 1305  TempSrc: Oral                 Ryan P Ellender

## 2017-07-02 NOTE — Transfer of Care (Signed)
Immediate Anesthesia Transfer of Care Note  Patient: Abigail DurhamPamela H Koch  Procedure(s) Performed: Procedure(s): CARDIOVERSION (N/A)  Patient Location: PACU and Endoscopy Unit  Anesthesia Type:General  Level of Consciousness: awake, patient cooperative and responds to stimulation  Airway & Oxygen Therapy: Patient Spontanous Breathing and Patient connected to nasal cannula oxygen  Post-op Assessment: Report given to RN and Post -op Vital signs reviewed and stable  Post vital signs: Reviewed and stable  Last Vitals:  Vitals:   07/02/17 1259 07/02/17 1300  BP:    Pulse: 63 (!) 56  Resp: (!) 22 (!) 21  Temp:    SpO2: 99% 97%    Last Pain:  Vitals:   07/02/17 1223  TempSrc: Oral         Complications: No apparent anesthesia complications

## 2017-07-09 ENCOUNTER — Ambulatory Visit (HOSPITAL_COMMUNITY)
Admission: RE | Admit: 2017-07-09 | Discharge: 2017-07-09 | Disposition: A | Discharge status: Home / Self Care | Payer: Managed Care, Other (non HMO) | Source: Ambulatory Visit | Attending: Nurse Practitioner | Admitting: Nurse Practitioner

## 2017-07-09 ENCOUNTER — Encounter (HOSPITAL_COMMUNITY): Payer: Self-pay | Admitting: Nurse Practitioner

## 2017-07-09 VITALS — BP 122/92 | HR 77 | Ht 63.0 in | Wt 304.2 lb

## 2017-07-09 DIAGNOSIS — E669 Obesity, unspecified: Secondary | ICD-10-CM | POA: Insufficient documentation

## 2017-07-09 DIAGNOSIS — Z79899 Other long term (current) drug therapy: Secondary | ICD-10-CM | POA: Insufficient documentation

## 2017-07-09 DIAGNOSIS — I10 Essential (primary) hypertension: Secondary | ICD-10-CM | POA: Diagnosis not present

## 2017-07-09 DIAGNOSIS — Z880 Allergy status to penicillin: Secondary | ICD-10-CM | POA: Insufficient documentation

## 2017-07-09 DIAGNOSIS — Z87891 Personal history of nicotine dependence: Secondary | ICD-10-CM | POA: Diagnosis not present

## 2017-07-09 DIAGNOSIS — I480 Paroxysmal atrial fibrillation: Secondary | ICD-10-CM | POA: Insufficient documentation

## 2017-07-09 DIAGNOSIS — F329 Major depressive disorder, single episode, unspecified: Secondary | ICD-10-CM | POA: Insufficient documentation

## 2017-07-09 DIAGNOSIS — I4819 Other persistent atrial fibrillation: Secondary | ICD-10-CM

## 2017-07-09 DIAGNOSIS — E0590 Thyrotoxicosis, unspecified without thyrotoxic crisis or storm: Secondary | ICD-10-CM | POA: Diagnosis not present

## 2017-07-09 DIAGNOSIS — I481 Persistent atrial fibrillation: Secondary | ICD-10-CM | POA: Insufficient documentation

## 2017-07-09 DIAGNOSIS — R0683 Snoring: Secondary | ICD-10-CM | POA: Insufficient documentation

## 2017-07-09 DIAGNOSIS — Z7901 Long term (current) use of anticoagulants: Secondary | ICD-10-CM | POA: Diagnosis not present

## 2017-07-09 DIAGNOSIS — I4891 Unspecified atrial fibrillation: Secondary | ICD-10-CM | POA: Diagnosis present

## 2017-07-09 MED ORDER — FLECAINIDE ACETATE 50 MG PO TABS: 50.0000 mg | ORAL_TABLET | Freq: Two times a day (BID) | ORAL | 3 refills | Status: DC

## 2017-07-09 MED ORDER — METOPROLOL TARTRATE 25 MG PO TABS: 12.5000 mg | ORAL_TABLET | Freq: Two times a day (BID) | ORAL | 3 refills | Status: DC

## 2017-07-09 NOTE — Progress Notes (Signed)
Primary Care Physician: Laurann Montana, MD Referring Physician: Dr. Leonia Corona is a 61 y.o. female with a h/o PAF, obesity, HTN, ambulatory issues due to severe fallen arch of left arch, in afib clinic today for f/u cardioversion set up by Dr. Johney Frame. Did shockl out but felt felt like she returned to afib 1-2 days ago. Has a feeling of uneasiness in her chest when in afib. Is sedentary due to ambulatory issues and does not really notice shortness of breath. Contributes fatigue to rasing three grandchildren. She would prefer to be in SR. On xarelto for chadsvasc score of 2. Is pending a sleep study for h/o snoring. Drinks 3-4 drinks of wine a week. No excessive caffeine. No smoking.  F/u in afib clinic 9/22, on last visit, it was decided to take multaq 400 mg bid and then proceed to repeat cardioversion, allowing drug to load x 3 days. She returns today after successful cardioversion and is staying in SR. She has noted for the last couple of days that she may be retaining some fluid. Weight is up 1 pound. Discussed waiting  to see if fluid retention continues, if so, allow for wash out and would start flecainide. She is in agreement. Does feel better in SR. No GI  Issues on drug.  Returned to see Gypsy Balsam, NP , 10/5 and still had fluid issues. HCTZ was restarted and LLE improved. Bmet was done and PCP and was ok. She has been maintaining SR and is tolerating Multaq. She is hoping that she can have surgery on her left foot in the next few months so she can be more active which would help her weight issues. Continues on xarelto.  F/u in afib clinic 3/19. She is staying in SR. She continues on multaq and xarelto. She is hoping to lose weight and plans to start a meal replacement program. PharmD student screened a sample of the meal replacement package and did not see any issues with ingredients.  F/u in afib clinic, 8/17, she is in afib clinic for return of Afib since last  Saturday or Sunday. She is rate controlled. She is under a lot of stress from raising 3 grandchildren and her mother being sick and in the hospital last weekend. In fact pt missed her dose of xarelto, Saturday pm. She has not been as consistent with the timing of her xarelto with all the issues going on. She has had almost one year of SR on multaq.  F/u in afib clinic 8/31, pt returns and is in afib. She has not missed any doses of xarelto since 8/11.She wants to pursue cardioversion.   F/u in afib clinic, 9/13, and unfortunately she did not return to SR with cardioversion. Change in antiarrythmic's discussed. She is interested in using  Flecainide. No structural heart disease, no interval changes on EKG and stress test last year no ischemia, no known CAD. Will allow for washout of multaq and start flecainide next week.  Today, she denies symptoms of palpitations, chest pain, shortness of breath, orthopnea, PND, lower extremity edema, dizziness, presyncope, syncope, or neurologic sequela. The patient is tolerating medications without difficulties and is otherwise without complaint today.   Past Medical History:  Diagnosis Date  . Atrial fibrillation (HCC)   . Chronic lower back pain   . Depression   . DVT (deep venous thrombosis) (HCC) 1990s   LLE  . Hypertension   . Hyperthyroidism ~ 2000   "fine now" (04/23/2016)  .  Obesity   . Pneumonia 1980s X 1  . Snoring    has not had sleep study but suspects sleep apnea   Past Surgical History:  Procedure Laterality Date  . CARDIOVERSION N/A 06/18/2016   Procedure: CARDIOVERSION;  Surgeon: Laurey Morale, MD;  Location: [facility] ENDOSCOPY;  Service: Cardiovascular;  Laterality: N/A;  . CARDIOVERSION N/A 07/11/2016   Procedure: CARDIOVERSION;  Surgeon: Lewayne Bunting, MD;  Location: [facility] ENDOSCOPY;  Service: Cardiovascular;  Laterality: N/A;  . CARDIOVERSION N/A 07/02/2017   Procedure: CARDIOVERSION;  Surgeon: Thurmon Fair, MD;  Location: [facility] ENDOSCOPY;   Service: Cardiovascular;  Laterality: N/A;  . ENDOMETRIAL ABLATION  ~2006  . TONSILLECTOMY  1960s  . TUBAL LIGATION  1979    Current Outpatient Prescriptions  Medication Sig Dispense Refill  . amLODipine (NORVASC) 5 MG tablet Take 1 tablet by mouth daily.  0  . clonazePAM (KLONOPIN) 0.5 MG tablet Take 1 tablet by mouth 3 (three) times daily as needed for anxiety.   0  . DULoxetine (CYMBALTA) 30 MG capsule Take 90 mg by mouth daily.    Marland Kitchen ibuprofen (ADVIL,MOTRIN) 200 MG tablet Take 200 mg by mouth as needed.    Marland Kitchen losartan-hydrochlorothiazide (HYZAAR) 100-25 MG tablet Take 1 tablet by mouth daily.    . pantoprazole (PROTONIX) 40 MG tablet Take 1 tablet (40 mg total) by mouth daily. 30 tablet 1  . traMADol (ULTRAM) 50 MG tablet Take 1 tablet by mouth 2 (two) times daily as needed (pain).   0  . XARELTO 20 MG TABS tablet Take 1 tablet by mouth daily.    . flecainide (TAMBOCOR) 50 MG tablet Take 1 tablet (50 mg total) by mouth 2 (two) times daily. 60 tablet 3  . metoprolol tartrate (LOPRESSOR) 25 MG tablet Take 0.5 tablets (12.5 mg total) by mouth 2 (two) times daily. 60 tablet 3   No current facility-administered medications for this encounter.     Allergies  Allergen Reactions  . Penicillins Hives and Shortness Of Breath  . Codeine Hives    Social History   Social History  . Marital status: Married    Spouse name: [name]  . Number of children: 2  . Years of education: N/A   Occupational History  . [employer]ICES    Social History Main Topics  . Smoking status: Former Smoker    Packs/day: 0.75    Years: 10.00    Types: Cigarettes  . Smokeless tobacco: Never Used     Comment: 04/23/2016 "stopped smoking cigarettes in ~ 1989"  . Alcohol use 2.4 oz/week    4 Glasses of wine per week     Comment: recently quit  . Drug use: Yes    Types: Marijuana     Comment: 04/23/2016 "recreational marijuana in my late teens/early 20's"  . Sexual activity: Not Currently   Other Topics  Concern  . Not on file   Social History Narrative   Lives in [place] with husband and 3 grandchildren.   Self employed office cleaner.    Family History  Problem Relation Age of Onset  . Diabetes Mellitus I Father   . CVA Father   . Arthritis Mother   . Obesity Mother   . CAD Maternal Grandfather     ROS- All systems are reviewed and negative except as per the HPI above  Physical Exam: Vitals:   07/09/17 0839  BP: (!) 122/92  Pulse: 77  Weight: (!) 304 lb 3.2 oz (138 kg)  Height: 5'  3" (1.6 m)    GEN- The patient is well appearing, alert and oriented x 3 today.   Head- normocephalic, atraumatic Eyes-  Sclera clear, conjunctiva pink Ears- hearing intact Oropharynx- clear Neck- supple, no JVP Lymph- no cervical lymphadenopathy Lungs- Clear to ausculation bilaterally, normal work of breathing Heart-slow irregular rate and rhythm, no murmurs, rubs or gallops, PMI not laterally displaced GI- soft, NT, ND, + BS Extremities- no clubbing, cyanosis, or trace to 1+ edema MS- no significant deformity or atrophy Skin- no rash or lesion Psych- euthymic mood, full affect Neuro- strength and sensation are intact  EKG- afib at 77 , qrs int 92 ms, qtc 441 ms Epic records reviewed Echo-Technically difficult study, Definity contrast administered. LVEF   60-65%, grossly normal wall motion, normal wall thickness, aortic   sclerosis with trivial AI, moderate LAE, mild RAE, mild RVE with   mildly reduced RV function, normal IVC.  Pharmacologic 2 day  myoview- 04/23/17  Perfusion: No decreased activity in the left ventricle on stress imaging to suggest reversible ischemia or infarction. Mild breast attenuation of the anterior wall.  Wall Motion: Normal left ventricular wall motion. No left ventricular dilation.  Left Ventricular Ejection Fraction: 54 %  End diastolic volume 114 ml  End systolic volume 52 ml  IMPRESSION: 1. No reversible ischemia or infarction.  2.  Normal left ventricular wall motion.  3. Left ventricular ejection fraction 54%  4. Non invasive risk stratification*: Low   Assessment and Plan:  1. PAF Unsuccessful cardioverion Discussed  changing to another antiarrythmic, ie , flecainide Pt is willing to do so, no CAD, structural heart disease, or EKG interval changes Continue xarelto 20 mg qd  She will stop multaq today, start metoprolol 25 mg 1/2 tab bid On Monday, will start flecainde 50 mg bid Return Thursday am for EKG If 50 mg tolerated ok plan to increase to 100 mg bid and then  another cardioversion   2. Obseity Encouraged weight loss  3. Snoring Failed to have sleep study, wants to discuss with PCP    4. HTN Well managed   F/u Thursday for EKG  Lupita LeashDonna C. Matthew Folksarroll, ANP-C Afib [facility] [address] [place], [place] [address] [phone]

## 2017-07-09 NOTE — Patient Instructions (Signed)
Your physician has recommended you make the following change in your medication:  1)Stop multaq today 2)Start Metoprolol 1/2 tablet twice a day (12.5mg ) 3)On Monday, Sept 17th -- start Flecainide 50mg  twice a day

## 2017-07-16 ENCOUNTER — Ambulatory Visit (HOSPITAL_COMMUNITY)
Admission: RE | Admit: 2017-07-16 | Discharge: 2017-07-16 | Disposition: A | Discharge status: Home / Self Care | Payer: Managed Care, Other (non HMO) | Source: Ambulatory Visit | Attending: Nurse Practitioner | Admitting: Nurse Practitioner

## 2017-07-16 DIAGNOSIS — I4891 Unspecified atrial fibrillation: Secondary | ICD-10-CM | POA: Insufficient documentation

## 2017-07-16 DIAGNOSIS — Z79899 Other long term (current) drug therapy: Secondary | ICD-10-CM | POA: Diagnosis not present

## 2017-07-16 MED ORDER — FLECAINIDE ACETATE 50 MG PO TABS: 100.0000 mg | ORAL_TABLET | Freq: Two times a day (BID) | ORAL | 3 refills | Status: DC

## 2017-07-16 MED ORDER — FLECAINIDE ACETATE 100 MG PO TABS: 100.0000 mg | ORAL_TABLET | Freq: Two times a day (BID) | ORAL | 3 refills | Status: DC

## 2017-07-16 NOTE — Progress Notes (Signed)
Pt in for EKG evaluation after starting Flecainide 50 mg bid. Still in afib, rate controlled. Qrs int 98 ms, qrs int 416 ms. Feels better this week on flecainide. Will increase to 100 mg bid and see back in 5 days and if still in afib will plan for cardioversion.

## 2017-07-16 NOTE — Patient Instructions (Signed)
Your physician has [id number]$100mg a day)

## 2017-07-21 ENCOUNTER — Ambulatory Visit (HOSPITAL_COMMUNITY)
Admission: RE | Admit: 2017-07-21 | Discharge: 2017-07-21 | Disposition: A | Discharge status: Home / Self Care | Payer: Managed Care, Other (non HMO) | Source: Ambulatory Visit | Attending: Nurse Practitioner | Admitting: Nurse Practitioner

## 2017-07-21 ENCOUNTER — Encounter (HOSPITAL_COMMUNITY): Payer: Self-pay | Admitting: Nurse Practitioner

## 2017-07-21 DIAGNOSIS — Z79899 Other long term (current) drug therapy: Secondary | ICD-10-CM | POA: Insufficient documentation

## 2017-07-21 DIAGNOSIS — I4510 Unspecified right bundle-branch block: Secondary | ICD-10-CM | POA: Diagnosis not present

## 2017-07-21 DIAGNOSIS — R9431 Abnormal electrocardiogram [ECG] [EKG]: Secondary | ICD-10-CM | POA: Insufficient documentation

## 2017-07-21 DIAGNOSIS — I4891 Unspecified atrial fibrillation: Secondary | ICD-10-CM | POA: Diagnosis not present

## 2017-07-21 MED ORDER — FLECAINIDE ACETATE 50 MG PO TABS: 75.0000 mg | ORAL_TABLET | Freq: Two times a day (BID) | ORAL | 6 refills | Status: DC

## 2017-07-21 NOTE — Progress Notes (Signed)
Patient ID: [name], female   DOB: 03/25/1956, 61 y.o.   MRN: [id number]   In for EKG on flecainide recently increased to 100 mg bid. Now with RBBB with qrs int 122 ms, qtc 442 ms, still in afib, rate controlled at 71 bpm.   Due to RBBB , will decrease dose to 75 mg bid. Her mother is expected to die within the week. She will let us know when she is ready for cardioversion, probably in the next 7-10 days.

## 2017-07-30 ENCOUNTER — Other Ambulatory Visit (HOSPITAL_COMMUNITY): Payer: Self-pay | Admitting: *Deleted

## 2017-07-30 MED ORDER — METOPROLOL TARTRATE 25 MG PO TABS: 12.5000 mg | ORAL_TABLET | Freq: Two times a day (BID) | ORAL | 3 refills | Status: DC

## 2017-09-24 ENCOUNTER — Encounter (HOSPITAL_COMMUNITY): Payer: Self-pay | Admitting: Nurse Practitioner

## 2017-09-24 ENCOUNTER — Ambulatory Visit (HOSPITAL_COMMUNITY)
Admission: RE | Admit: 2017-09-24 | Discharge: 2017-09-24 | Disposition: A | Discharge status: Home / Self Care | Payer: Managed Care, Other (non HMO) | Source: Ambulatory Visit | Attending: Nurse Practitioner | Admitting: Nurse Practitioner

## 2017-09-24 VITALS — BP 118/82 | HR 67 | Ht 63.0 in | Wt 301.0 lb

## 2017-09-24 DIAGNOSIS — Z9889 Other specified postprocedural states: Secondary | ICD-10-CM | POA: Insufficient documentation

## 2017-09-24 DIAGNOSIS — Z6841 Body Mass Index (BMI) 40.0 and over, adult: Secondary | ICD-10-CM | POA: Diagnosis not present

## 2017-09-24 DIAGNOSIS — E669 Obesity, unspecified: Secondary | ICD-10-CM | POA: Diagnosis not present

## 2017-09-24 DIAGNOSIS — Z79891 Long term (current) use of opiate analgesic: Secondary | ICD-10-CM | POA: Diagnosis not present

## 2017-09-24 DIAGNOSIS — Z87891 Personal history of nicotine dependence: Secondary | ICD-10-CM | POA: Diagnosis not present

## 2017-09-24 DIAGNOSIS — Z9851 Tubal ligation status: Secondary | ICD-10-CM | POA: Diagnosis not present

## 2017-09-24 DIAGNOSIS — I4819 Other persistent atrial fibrillation: Secondary | ICD-10-CM

## 2017-09-24 DIAGNOSIS — Z8249 Family history of ischemic heart disease and other diseases of the circulatory system: Secondary | ICD-10-CM | POA: Insufficient documentation

## 2017-09-24 DIAGNOSIS — Z833 Family history of diabetes mellitus: Secondary | ICD-10-CM | POA: Diagnosis not present

## 2017-09-24 DIAGNOSIS — R0683 Snoring: Secondary | ICD-10-CM | POA: Insufficient documentation

## 2017-09-24 DIAGNOSIS — I480 Paroxysmal atrial fibrillation: Secondary | ICD-10-CM | POA: Diagnosis present

## 2017-09-24 DIAGNOSIS — I10 Essential (primary) hypertension: Secondary | ICD-10-CM | POA: Insufficient documentation

## 2017-09-24 DIAGNOSIS — Z79899 Other long term (current) drug therapy: Secondary | ICD-10-CM | POA: Diagnosis not present

## 2017-09-24 DIAGNOSIS — I481 Persistent atrial fibrillation: Secondary | ICD-10-CM

## 2017-09-24 LAB — CBC
HCT: 44.4 % (ref 36.0–46.0)
Hemoglobin: 14.5 g/dL (ref 12.0–15.0)
MCH: 27 pg (ref 26.0–34.0)
MCHC: 32.7 g/dL (ref 30.0–36.0)
MCV: 82.5 fL (ref 78.0–100.0)
Platelets: 271 10*3/uL (ref 150–400)
RBC: 5.38 MIL/uL — ABNORMAL HIGH (ref 3.87–5.11)
RDW: 14.5 % (ref 11.5–15.5)
WBC: 5.6 10*3/uL (ref 4.0–10.5)

## 2017-09-24 LAB — BASIC METABOLIC PANEL
Anion gap: 6 (ref 5–15)
BUN: 19 mg/dL (ref 6–20)
CO2: 27 mmol/L (ref 22–32)
Calcium: 9.1 mg/dL (ref 8.9–10.3)
Chloride: 104 mmol/L (ref 101–111)
Creatinine, Ser: 0.84 mg/dL (ref 0.44–1.00)
GFR calc Af Amer: >60 mL/min (ref >60)
GFR calc non Af Amer: >60 mL/min (ref >60)
Glucose, Bld: 80 mg/dL (ref 65–99)
Potassium: 4 mmol/L (ref 3.5–5.1)
Sodium: 137 mmol/L (ref 135–145)

## 2017-09-24 NOTE — Progress Notes (Signed)
Primary Care Physician: Laurann MontanaWhite, Cynthia, MD Referring Physician: Dr. Leonia CoronaAllred   [name] is a 61 y.o. female with a h/o PAF, obesity, HTN, ambulatory issues due to severe fallen arch of left foot, in afib clinic today for f/u start of flecainide and to set up for cardioversion. Pt had held in SR for a long time in Multaq but then had breakthrough afib. She was cardioverted with ERAF. Multaq was washed out and flecainde was started. She was suppose to return in October for cardioversion after starting flecainde, but her mother got sick and died, and then her granddaughter whom she raises had to have colon surgery,  She is now ready for cardioversion. She continues on flecainide 75 mg bid and has not missed any doses of her xarelto.  Today, she denies symptoms of palpitations, chest pain, shortness of breath, orthopnea, PND, lower extremity edema, dizziness, presyncope, syncope, or neurologic sequela. The patient is tolerating medications without difficulties and is otherwise without complaint today.   Past Medical History:  Diagnosis Date  . Atrial fibrillation (HCC)   . Chronic lower back pain   . Depression   . DVT (deep venous thrombosis) (HCC) 1990s   LLE  . Hypertension   . Hyperthyroidism ~ 2000   "fine now" (04/23/2016)  . Obesity   . Pneumonia 1980s X 1  . Snoring    has not had sleep study but suspects sleep apnea   Past Surgical History:  Procedure Laterality Date  . CARDIOVERSION N/A 06/18/2016   Procedure: CARDIOVERSION;  Surgeon: Laurey Moralealton S McLean, MD;  [facility];  Service: Cardiovascular;  Laterality: N/A;  . CARDIOVERSION N/A 07/11/2016   Procedure: CARDIOVERSION;  Surgeon: Lewayne BuntingBrian S Crenshaw, MD;  [facility];  Service: Cardiovascular;  Laterality: N/A;  . CARDIOVERSION N/A 07/02/2017   Procedure: CARDIOVERSION;  Surgeon: Thurmon Fairroitoru, Mihai, MD;  Location: [facility] ENDOSCOPY;  Service: Cardiovascular;  Laterality: N/A;  . ENDOMETRIAL ABLATION  ~2006   . TONSILLECTOMY  1960s  . TUBAL LIGATION  1979    Current Outpatient Medications  Medication Sig Dispense Refill  . amLODipine (NORVASC) 5 MG tablet Take 1 tablet by mouth daily.  0  . clonazePAM (KLONOPIN) 0.5 MG tablet Take 1 tablet by mouth 3 (three) times daily as needed for anxiety.   0  . DULoxetine (CYMBALTA) 30 MG capsule Take 90 mg by mouth daily.    . flecainide (TAMBOCOR) 50 MG tablet Take 1.5 tablets (75 mg total) by mouth 2 (two) times daily. 90 tablet 6  . ibuprofen (ADVIL,MOTRIN) 200 MG tablet Take 200 mg by mouth as needed.    Marland Kitchen. losartan-hydrochlorothiazide (HYZAAR) 100-25 MG tablet Take 1 tablet by mouth daily.    . metoprolol tartrate (LOPRESSOR) 25 MG tablet Take 0.5 tablets (12.5 mg total) by mouth 2 (two) times daily. 60 tablet 3  . pantoprazole (PROTONIX) 40 MG tablet Take 1 tablet (40 mg total) by mouth daily. 30 tablet 1  . traMADol (ULTRAM) 50 MG tablet Take 1 tablet by mouth 2 (two) times daily as needed (pain).   0  . XARELTO 20 MG TABS tablet Take 1 tablet by mouth daily.     No current facility-administered medications for this encounter.     Allergies  Allergen Reactions  . Penicillins Hives and Shortness Of Breath  . Codeine Hives    Social History   Socioeconomic History  . Marital status: Married    Spouse name: TERRY  . Number of children: 2  .  Years of education: Not on file  . Highest education level: Not on file  Social Needs  . Financial resource strain: Not on file  . Food insecurity - worry: Not on file  . Food insecurity - inability: Not on file  . Transportation needs - medical: Not on file  . Transportation needs - non-medical: Not on file  Occupational History  . Occupation: CLEANS OFFICES  Tobacco Use  . Smoking status: Former Smoker    Packs/day: 0.75    Years: 10.00    Pack years: 7.50    Types: Cigarettes  . Smokeless tobacco: Never Used  . Tobacco comment: 04/23/2016 "stopped smoking cigarettes in ~ 1989"  Substance  and Sexual Activity  . Alcohol use: Yes    Alcohol/week: 2.4 oz    Types: 4 Glasses of wine per week    Comment: recently quit  . Drug use: Yes    Types: Marijuana    Comment: 04/23/2016 "recreational marijuana in my late teens/early 20's"  . Sexual activity: Not Currently  Other Topics Concern  . Not on file  Social History Narrative   [place] and 3 grandchildren.   Self employed office cleaner.    Family History  Problem Relation Age of Onset  . Diabetes Mellitus I Father   . CVA Father   . Arthritis Mother   . Obesity Mother   . CAD Maternal Grandfather     ROS- All systems are reviewed and negative except as per the HPI above  Physical Exam: Vitals:   09/24/17 0829  BP: 118/82  Pulse: 67  Weight: (!) 301 lb (136.5 kg)  Height: 5\' 3"  (1.6 m)    GEN- The patient is well appearing, alert and oriented x 3 today.   Head- normocephalic, atraumatic Eyes-  Sclera clear, conjunctiva pink Ears- hearing intact Oropharynx- clear Neck- supple, no JVP Lymph- no cervical lymphadenopathy Lungs- Clear to ausculation bilaterally, normal work of breathing Heart-slow irregular rate and rhythm, no murmurs, rubs or gallops, PMI not laterally displaced GI- soft, NT, ND, + BS Extremities- no clubbing, cyanosis, or trace to 1+ edema MS- no significant deformity or atrophy Skin- no rash or lesion Psych- euthymic mood, full affect Neuro- strength and sensation are intact  EKG- afib at 69 , qrs int 120 ms, qtc 435 ms Epic records reviewed Echo-Technically difficult study, Definity contrast administered. LVEF   60-65%, grossly normal wall motion, normal wall thickness, aortic   sclerosis with trivial AI, moderate LAE, mild RAE, mild RVE with   mildly reduced RV function, normal IVC.  Pharmacologic 2 day  myoview- 04/23/17  Perfusion: No decreased activity in the left ventricle on stress imaging to suggest reversible ischemia or infarction. Mild  breast attenuation of the anterior wall.  Wall Motion: Normal left ventricular wall motion. No left ventricular dilation.  Left Ventricular Ejection Fraction: 54 %  End diastolic volume 114 ml  End systolic volume 52 ml  IMPRESSION: 1. No reversible ischemia or infarction.  2. Normal left ventricular wall motion.  3. Left ventricular ejection fraction 54%  4. Non invasive risk stratification*: Low   Assessment and Plan:  1. PAF Unsuccessful cardioversion 07/02/17 Multaq washout out and flecainide started Cardioversion on flecainide delayed 2/2 to family situations Is now ready for cardioversion, 12/4 Continue xarelto 20 mg qd  Continue  metoprolol 25 mg 1/2 tab bid, hold am of cardioversion Continue  flecainde 75 mg bid  2. Obseity Encouraged weight loss  3. Snoring Sleep  study ordered in past but pt did not carry thru with plans  4. HTN Well managed   F/u one week after cardioversion  Lupita Leash C. Matthew Folks Afib Clinic [facility] [address] [place], [place] [address] [phone]

## 2017-09-24 NOTE — Patient Instructions (Signed)
Cardioversion scheduled for Tuesday, December 4th  - [facility] at [facility] and go to admitting at 8:30AM  -Do not eat or drink anything after midnight the night prior to your procedure.  - Take all your medication with a sip of water prior to arrival except metoprolol.  - You will not be able to drive home after your procedure.

## 2017-09-29 ENCOUNTER — Ambulatory Visit (HOSPITAL_COMMUNITY): Payer: 59 | Admitting: Anesthesiology

## 2017-09-29 ENCOUNTER — Ambulatory Visit (HOSPITAL_COMMUNITY)
Admission: RE | Admit: 2017-09-29 | Discharge: 2017-09-29 | Disposition: A | Discharge status: Home / Self Care | Payer: 59 | Source: Ambulatory Visit | Attending: Cardiology | Admitting: Cardiology

## 2017-09-29 ENCOUNTER — Encounter (HOSPITAL_COMMUNITY): Payer: Self-pay | Admitting: *Deleted

## 2017-09-29 ENCOUNTER — Encounter (HOSPITAL_COMMUNITY)
Admission: RE | Disposition: A | Discharge status: Home / Self Care | Payer: Self-pay | Source: Ambulatory Visit | Attending: Cardiology

## 2017-09-29 ENCOUNTER — Other Ambulatory Visit: Payer: Self-pay

## 2017-09-29 DIAGNOSIS — Z79899 Other long term (current) drug therapy: Secondary | ICD-10-CM | POA: Diagnosis not present

## 2017-09-29 DIAGNOSIS — Z8261 Family history of arthritis: Secondary | ICD-10-CM | POA: Diagnosis not present

## 2017-09-29 DIAGNOSIS — Z9889 Other specified postprocedural states: Secondary | ICD-10-CM | POA: Diagnosis not present

## 2017-09-29 DIAGNOSIS — Z880 Allergy status to penicillin: Secondary | ICD-10-CM | POA: Insufficient documentation

## 2017-09-29 DIAGNOSIS — I4891 Unspecified atrial fibrillation: Secondary | ICD-10-CM | POA: Diagnosis not present

## 2017-09-29 DIAGNOSIS — Z87891 Personal history of nicotine dependence: Secondary | ICD-10-CM | POA: Insufficient documentation

## 2017-09-29 DIAGNOSIS — Z885 Allergy status to narcotic agent status: Secondary | ICD-10-CM | POA: Diagnosis not present

## 2017-09-29 DIAGNOSIS — I10 Essential (primary) hypertension: Secondary | ICD-10-CM | POA: Diagnosis not present

## 2017-09-29 DIAGNOSIS — Z6841 Body Mass Index (BMI) 40.0 and over, adult: Secondary | ICD-10-CM | POA: Insufficient documentation

## 2017-09-29 DIAGNOSIS — Z823 Family history of stroke: Secondary | ICD-10-CM | POA: Diagnosis not present

## 2017-09-29 DIAGNOSIS — I4510 Unspecified right bundle-branch block: Secondary | ICD-10-CM | POA: Diagnosis not present

## 2017-09-29 DIAGNOSIS — Z833 Family history of diabetes mellitus: Secondary | ICD-10-CM | POA: Insufficient documentation

## 2017-09-29 DIAGNOSIS — F329 Major depressive disorder, single episode, unspecified: Secondary | ICD-10-CM | POA: Insufficient documentation

## 2017-09-29 DIAGNOSIS — Z86718 Personal history of other venous thrombosis and embolism: Secondary | ICD-10-CM | POA: Insufficient documentation

## 2017-09-29 DIAGNOSIS — Z7901 Long term (current) use of anticoagulants: Secondary | ICD-10-CM | POA: Insufficient documentation

## 2017-09-29 DIAGNOSIS — Z8249 Family history of ischemic heart disease and other diseases of the circulatory system: Secondary | ICD-10-CM | POA: Diagnosis not present

## 2017-09-29 DIAGNOSIS — E669 Obesity, unspecified: Secondary | ICD-10-CM | POA: Diagnosis not present

## 2017-09-29 HISTORY — PX: CARDIOVERSION: SHX1299

## 2017-09-29 SURGERY — CARDIOVERSION
Anesthesia: General

## 2017-09-29 MED ORDER — SODIUM CHLORIDE 0.9 % IV SOLN
INTRAVENOUS | Status: AC | PRN
  Administered 2017-09-29: 500 mL via INTRAVENOUS

## 2017-09-29 MED ORDER — LIDOCAINE 2% (20 MG/ML) 5 ML SYRINGE
INTRAMUSCULAR | Status: DC | PRN
  Administered 2017-09-29: 40 mg via INTRAVENOUS

## 2017-09-29 MED ORDER — PROPOFOL 10 MG/ML IV BOLUS
INTRAVENOUS | Status: DC | PRN
  Administered 2017-09-29: 80 mg via INTRAVENOUS

## 2017-09-29 NOTE — Anesthesia Procedure Notes (Signed)
Procedure Name: General with mask airway Date/Time: 09/29/2017 11:45 AM Performed by: Elliot DallyHuggins, Diana, CRNA Pre-anesthesia Checklist: Patient identified, Emergency Drugs available, Suction available and Patient being monitored Patient Re-evaluated:Patient Re-evaluated prior to induction Oxygen Delivery Method: Ambu bag Preoxygenation: Pre-oxygenation with 100% oxygen Induction Type: IV induction

## 2017-09-29 NOTE — Transfer of Care (Signed)
Immediate Anesthesia Transfer of Care Note  Patient: Abigail DurhamPamela H Picha  Procedure(s) Performed: CARDIOVERSION (N/A )  Patient Location: Endoscopy Unit  Anesthesia Type:General  Level of Consciousness: drowsy  Airway & Oxygen Therapy: Patient Spontanous Breathing  Post-op Assessment: Report given to RN and Post -op Vital signs reviewed and stable  Post vital signs: Reviewed and stable  Last Vitals:  Vitals:   09/29/17 0916  BP: 115/78  Resp: 19  Temp: 36.9 C  SpO2: 92%    Last Pain:  Vitals:   09/29/17 0916  TempSrc: Oral         Complications: No apparent anesthesia complications

## 2017-09-29 NOTE — Discharge Instructions (Signed)
Electrical Cardioversion, Care After °This sheet gives you information about how to care for yourself after your procedure. Your health care provider may also give you more specific instructions. If you have problems or questions, contact your health care provider. °What can I expect after the procedure? °After the procedure, it is common to have: °· Some redness on the skin where the shocks were given. ° °Follow these instructions at home: °· Do not drive for 24 hours if you were given a medicine to help you relax (sedative). °· Take over-the-counter and prescription medicines only as told by your health care provider. °· Ask your health care provider how to check your pulse. Check it often. °· Rest for 48 hours after the procedure or as told by your health care provider. °· Avoid or limit your caffeine use as told by your health care provider. °Contact a health care provider if: °· You feel like your heart is beating too quickly or your pulse is not regular. °· You have a serious muscle cramp that does not go away. °Get help right away if: °· You have discomfort in your chest. °· You are dizzy or you feel faint. °· You have trouble breathing or you are short of breath. °· Your speech is slurred. °· You have trouble moving an arm or leg on one side of your body. °· Your fingers or toes turn cold or blue. °This information is not intended to replace advice given to you by your health care provider. Make sure you discuss any questions you have with your health care provider. °Document Released: 08/03/2013 Document Revised: 05/16/2016 Document Reviewed: 04/18/2016 °Elsevier Interactive Patient Education © 2018 [employer] ° °

## 2017-09-29 NOTE — Anesthesia Preprocedure Evaluation (Signed)
Anesthesia Evaluation  Patient identified by MRN, date of birth, ID band Patient awake    Reviewed: Allergy & Precautions, NPO status , Patient's Chart, lab work & pertinent test results  Airway Mallampati: II  TM Distance: >3 FB Neck ROM: Full    Dental no notable dental hx.    Pulmonary sleep apnea , former smoker,    breath sounds clear to auscultation + decreased breath sounds      Cardiovascular hypertension, negative cardio ROS  + dysrhythmias Atrial Fibrillation  Rhythm:Irregular Rate:Normal     Neuro/Psych negative neurological ROS  negative psych ROS   GI/Hepatic negative GI ROS, Neg liver ROS,   Endo/Other  Hyperthyroidism Morbid obesity  Renal/GU negative Renal ROS  negative genitourinary   Musculoskeletal negative musculoskeletal ROS (+)   Abdominal (+) + obese,   Peds negative pediatric ROS (+)  Hematology negative hematology ROS (+)   Anesthesia Other Findings   Reproductive/Obstetrics negative OB ROS                             Anesthesia Physical Anesthesia Plan  ASA: IV  Anesthesia Plan: General   Post-op Pain Management:    Induction: Intravenous  PONV Risk Score and Plan: 0  Airway Management Planned: Mask  Additional Equipment:   Intra-op Plan:   Post-operative Plan:   Informed Consent: I have reviewed the patients History and Physical, chart, labs and discussed the procedure including the risks, benefits and alternatives for the proposed anesthesia with the patient or authorized representative who has indicated his/her understanding and acceptance.   Dental advisory given  Plan Discussed with: CRNA and Surgeon  Anesthesia Plan Comments:         Anesthesia Quick Evaluation

## 2017-09-30 ENCOUNTER — Encounter (HOSPITAL_COMMUNITY): Payer: Self-pay | Admitting: Cardiology

## 2017-10-01 NOTE — Procedures (Signed)
Electrical Cardioversion Procedure Note Dale Durhamamela H Winsett [id number] Nov 05, 1955  Procedure: Electrical Cardioversion Indications:  Atrial Fibrillation  Procedure Details Consent: Risks of procedure as well as the alternatives and risks of each were explained to the (patient/caregiver).  Consent for procedure obtained. Time Out: Verified patient identification, verified procedure, site/side was marked, verified correct patient position, special equipment/implants available, medications/allergies/relevent history reviewed, required imaging and test results available.  Performed  Patient placed on cardiac monitor, pulse oximetry, supplemental oxygen as necessary.  Sedation given: Propofol per anesthesiology Pacer pads placed anterior and posterior chest.  Cardioverted 1 time(s).  Cardioverted at 200J.  Evaluation Findings: Post procedure EKG shows: NSR Complications: None Patient did tolerate procedure well.   Marca AnconaDalton McLean 10/01/2017, 10:38 AM

## 2017-10-01 NOTE — Anesthesia Postprocedure Evaluation (Signed)
Anesthesia Post Note  Patient: Dale DurhamPamela H Juliana  Procedure(s) Performed: CARDIOVERSION (N/A )     Patient location during evaluation: PACU Anesthesia Type: General Level of consciousness: awake and alert Pain management: pain level controlled Vital Signs Assessment: post-procedure vital signs reviewed and stable Respiratory status: spontaneous breathing, nonlabored ventilation, respiratory function stable and patient connected to nasal cannula oxygen Cardiovascular status: blood pressure returned to baseline and stable Postop Assessment: no apparent nausea or vomiting Anesthetic complications: no    Last Vitals:  Vitals:   09/29/17 1157 09/29/17 1210  BP: (!) 99/59 115/72  Pulse: (!) 58 60  Resp: (!) 24 16  Temp: (!) 36.3 C   SpO2: 94% 98%    Last Pain:  Vitals:   09/29/17 1157  TempSrc: Oral                 MASSAG[place],JAMES TERRILL

## 2017-10-07 ENCOUNTER — Ambulatory Visit (HOSPITAL_COMMUNITY): Payer: Managed Care, Other (non HMO) | Admitting: Nurse Practitioner

## 2017-10-08 ENCOUNTER — Other Ambulatory Visit (HOSPITAL_COMMUNITY): Payer: Self-pay | Admitting: *Deleted

## 2017-10-08 MED ORDER — FLECAINIDE ACETATE 50 MG PO TABS: 75.0000 mg | ORAL_TABLET | Freq: Two times a day (BID) | ORAL | 6 refills | Status: DC

## 2017-10-12 ENCOUNTER — Ambulatory Visit (HOSPITAL_COMMUNITY)
Admission: RE | Admit: 2017-10-12 | Discharge: 2017-10-12 | Disposition: A | Discharge status: Home / Self Care | Payer: 59 | Source: Ambulatory Visit | Attending: Nurse Practitioner | Admitting: Nurse Practitioner

## 2017-10-12 ENCOUNTER — Encounter (HOSPITAL_COMMUNITY): Payer: Self-pay | Admitting: Nurse Practitioner

## 2017-10-12 VITALS — BP 118/82 | HR 76 | Ht 63.0 in | Wt 300.0 lb

## 2017-10-12 DIAGNOSIS — I10 Essential (primary) hypertension: Secondary | ICD-10-CM | POA: Insufficient documentation

## 2017-10-12 DIAGNOSIS — I481 Persistent atrial fibrillation: Secondary | ICD-10-CM

## 2017-10-12 DIAGNOSIS — R0683 Snoring: Secondary | ICD-10-CM | POA: Diagnosis not present

## 2017-10-12 DIAGNOSIS — I480 Paroxysmal atrial fibrillation: Secondary | ICD-10-CM | POA: Insufficient documentation

## 2017-10-12 DIAGNOSIS — M2142 Flat foot [pes planus] (acquired), left foot: Secondary | ICD-10-CM | POA: Diagnosis not present

## 2017-10-12 DIAGNOSIS — Z87891 Personal history of nicotine dependence: Secondary | ICD-10-CM | POA: Diagnosis not present

## 2017-10-12 DIAGNOSIS — I4510 Unspecified right bundle-branch block: Secondary | ICD-10-CM | POA: Diagnosis not present

## 2017-10-12 DIAGNOSIS — Z79899 Other long term (current) drug therapy: Secondary | ICD-10-CM | POA: Diagnosis not present

## 2017-10-12 DIAGNOSIS — I4819 Other persistent atrial fibrillation: Secondary | ICD-10-CM

## 2017-10-12 DIAGNOSIS — F329 Major depressive disorder, single episode, unspecified: Secondary | ICD-10-CM | POA: Insufficient documentation

## 2017-10-12 DIAGNOSIS — E669 Obesity, unspecified: Secondary | ICD-10-CM | POA: Insufficient documentation

## 2017-10-12 DIAGNOSIS — Z7901 Long term (current) use of anticoagulants: Secondary | ICD-10-CM | POA: Insufficient documentation

## 2017-10-12 DIAGNOSIS — E0590 Thyrotoxicosis, unspecified without thyrotoxic crisis or storm: Secondary | ICD-10-CM | POA: Diagnosis not present

## 2017-10-12 DIAGNOSIS — I4891 Unspecified atrial fibrillation: Secondary | ICD-10-CM | POA: Diagnosis present

## 2017-10-12 DIAGNOSIS — Z880 Allergy status to penicillin: Secondary | ICD-10-CM | POA: Insufficient documentation

## 2017-10-12 LAB — T4, FREE: Free T4: 1.13 ng/dL — ABNORMAL HIGH (ref 0.61–1.12)

## 2017-10-12 LAB — TSH: TSH: 6.839 u[IU]/mL — ABNORMAL HIGH (ref 0.350–4.500)

## 2017-10-12 NOTE — Addendum Note (Signed)
Encounter addended by: Shona Simpsonarter,  S, RN on: 10/12/2017 1:12 PM  Actions taken: Order list changed

## 2017-10-12 NOTE — Progress Notes (Addendum)
Primary Care Physician: Laurann MontanaWhite, Cynthia, MD Referring Physician: Dr. Leonia CoronaAllred   [name] is a 61 y.o. female with a h/o PAF, obesity, HTN, ambulatory issues due to severe fallen arch of left foot, in afib clinic today for f/u start of flecainide and to set up for cardioversion. Pt had held in SR for a long time in [place] but then had breakthrough afib. She was cardioverted with ERAF. Multaq was washed out and flecainde was started. She was suppose to return in October for cardioversion after starting flecainde, but her mother got sick and died, and then her granddaughter whom she raises had to have colon surgery,  She is now ready for cardioversion. She continues on flecainide 75 mg bid and has not missed any doses of her xarelto.  F/u in afib clinic 12/17, after successful cardioversion 12/4. She is not sure when she went back into afib, but has not felt as well for the last few days. She has now failed multaq and flecainide. Discussed options to restore SR.  Today, she denies symptoms of palpitations, chest pain, shortness of breath, orthopnea, PND, lower extremity edema, dizziness, presyncope, syncope, or neurologic sequela. + for not sleeping well and fatigue.The patient is tolerating medications without difficulties and is otherwise without complaint today.   Past Medical History:  Diagnosis Date  . Atrial fibrillation (HCC)   . Chronic lower back pain   . Depression   . DVT (deep venous thrombosis) (HCC) 1990s   LLE  . Hypertension   . Hyperthyroidism ~ 2000   "fine now" (04/23/2016)  . Obesity   . Pneumonia 1980s X 1  . Snoring    has not had sleep study but suspects sleep apnea   Past Surgical History:  Procedure Laterality Date  . CARDIOVERSION N/A 06/18/2016   Procedure: CARDIOVERSION;  Surgeon: Laurey Moralealton S McLean, MD;  [facility];  Service: Cardiovascular;  Laterality: N/A;  . CARDIOVERSION N/A 07/11/2016   Procedure: CARDIOVERSION;  Surgeon: Lewayne BuntingBrian S  Crenshaw, MD;  [facility];  Service: Cardiovascular;  Laterality: N/A;  . CARDIOVERSION N/A 07/02/2017   Procedure: CARDIOVERSION;  Surgeon: Thurmon Fairroitoru, Mihai, MD;  Location: [facility] ENDOSCOPY;  Service: Cardiovascular;  Laterality: N/A;  . CARDIOVERSION N/A 09/29/2017   Procedure: CARDIOVERSION;  Surgeon: Laurey MoraleMcLean, Dalton S, MD;  [facility];  Service: Cardiovascular;  Laterality: N/A;  . ENDOMETRIAL ABLATION  ~2006  . TONSILLECTOMY  1960s  . TUBAL LIGATION  1979    Current Outpatient Medications  Medication Sig Dispense Refill  . acetaminophen (TYLENOL) 500 MG tablet Take 500 mg by mouth every 6 (six) hours as needed for moderate pain or headache.    Marland Kitchen. amLODipine (NORVASC) 5 MG tablet Take 5 mg by mouth daily.   0  . clonazePAM (KLONOPIN) 0.5 MG tablet Take 0.5 mg by mouth 3 (three) times daily as needed for anxiety.   0  . DULoxetine (CYMBALTA) 30 MG capsule Take 90 mg by mouth daily.    Marland Kitchen. losartan-hydrochlorothiazide (HYZAAR) 100-25 MG tablet Take 1 tablet by mouth daily.    . metoprolol tartrate (LOPRESSOR) 25 MG tablet Take 0.5 tablets (12.5 mg total) by mouth 2 (two) times daily. 60 tablet 3  . pantoprazole (PROTONIX) 40 MG tablet Take 1 tablet (40 mg total) by mouth daily. 30 tablet 1  . traMADol (ULTRAM) 50 MG tablet Take 50 mg by mouth 3 (three) times daily as needed for moderate pain.   0  . XARELTO 20 MG TABS  tablet Take 20 mg by mouth daily with supper.      No current facility-administered medications for this encounter.     Allergies  Allergen Reactions  . Penicillins Hives, Shortness Of Breath and Other (See Comments)    Has patient had a PCN reaction causing immediate rash, facial/tongue/throat swelling, SOB or lightheadedness with hypotension: Yes Has patient had a PCN reaction causing severe rash involving mucus membranes or skin necrosis: No Has patient had a PCN reaction that required hospitalization: No Has patient had a PCN reaction occurring within  the last 10 years: No If all of the above answers are "NO", then may proceed with Cephalosporin use.   . Codeine Hives    Social History   Socioeconomic History  . Marital status: Married    Spouse name: TERRY  . Number of children: 2  . Years of education: Not on file  . Highest education level: Not on file  Social Needs  . Financial resource strain: Not on file  . Food insecurity - worry: Not on file  . Food insecurity - inability: Not on file  . Transportation needs - medical: Not on file  . Transportation needs - non-medical: Not on file  Occupational History  . Occupation: CLEANS OFFICES  Tobacco Use  . Smoking status: Former Smoker    Packs/day: 0.75    Years: 10.00    Pack years: 7.50    Types: Cigarettes  . Smokeless tobacco: Never Used  . Tobacco comment: 04/23/2016 "stopped smoking cigarettes in ~ 1989"  Substance and Sexual Activity  . Alcohol use: Yes    Alcohol/week: 2.4 oz    Types: 4 Glasses of wine per week    Comment: recently quit  . Drug use: Yes    Types: Marijuana    Comment: 04/23/2016 "recreational marijuana in my late teens/early 20's"  . Sexual activity: Not Currently  Other Topics Concern  . Not on file  Social History Narrative   Lives in [place] with husband and 3 grandchildren.   Self employed office cleaner.    Family History  Problem Relation Age of Onset  . Diabetes Mellitus I Father   . CVA Father   . Arthritis Mother   . Obesity Mother   . CAD Maternal Grandfather     ROS- All systems are reviewed and negative except as per the HPI above  Physical Exam: Vitals:   10/12/17 0909  BP: 118/82  Pulse: 76  Weight: 300 lb (136.1 kg)  Height: 5\' 3"  (1.6 m)    GEN- The patient is well appearing, alert and oriented x 3 today.   Head- normocephalic, atraumatic Eyes-  Sclera clear, conjunctiva pink Ears- hearing intact Oropharynx- clear Neck- supple, no JVP Lymph- no cervical lymphadenopathy Lungs- Clear to ausculation  bilaterally, normal work of breathing Heart- irregular rate and rhythm, no murmurs, rubs or gallops, PMI not laterally displaced GI- soft, NT, ND, + BS Extremities- no clubbing, cyanosis, or trace to 1+ edema MS- no significant deformity or atrophy Skin- no rash or lesion Psych- euthymic mood, full affect Neuro- strength and sensation are intact  EKG- afib at 67 , qrs int 120 ms, qtc 435 ms Epic records reviewed Echo-03/2016-Technically difficult study, Definity contrast administered. LVEF   60-65%, grossly normal wall motion, normal wall thickness, aortic   sclerosis with trivial AI, moderate LAE, mild RAE, mild RVE with   mildly reduced RV function, normal IVC.  Pharmacologic 2 day  myoview- 04/23/17  Perfusion: No decreased activity  in the left ventricle on stress imaging to suggest reversible ischemia or infarction. Mild breast attenuation of the anterior wall.  Wall Motion: Normal left ventricular wall motion. No left ventricular dilation.  Left Ventricular Ejection Fraction: 54 %  End diastolic volume 114 ml  End systolic volume 52 ml  IMPRESSION: 1. No reversible ischemia or infarction.  2. Normal left ventricular wall motion.  3. Left ventricular ejection fraction 54%  4. Non invasive risk stratification*: Low   Assessment and Plan:  1. PAF Unsuccessful cardioversion 07/02/17 Multaq washout out and flecainide started Successful cardioversion 12/4 but now with ERAF Will stop flecainide today Continue xarelto 20 mg qd, states no missed doses with chadsvasc score of at least 2  Continue  metoprolol 25 mg 1/2 tab bid   2. Obseity Encouraged weight loss Has orthopedic issues which limit her activity Wants to have foot surgery but grandchildren issues(whom she is raising) have been more important and she has not been able to have surgery  3. Snoring Sleep study ordered in past but pt did not carry thru with plans Does feel she snores but does not  thinks she has apnea spells  4. HTN Well managed   Will update echo and refer back to Dr. Johney FrameAllred for discussion re ablation, discussed with pt that her weight may be an issue     Lupita LeashDonna C. Matthew Folksarroll, ANP-C Afib Clinic [facility] [address] [place], [place] [address] [phone]

## 2017-10-13 LAB — T3, FREE: T3, Free: 2.9 pg/mL (ref 2.0–4.4)

## 2017-10-23 ENCOUNTER — Ambulatory Visit (HOSPITAL_COMMUNITY)
Admission: RE | Admit: 2017-10-23 | Discharge: 2017-10-23 | Disposition: A | Discharge status: Home / Self Care | Payer: 59 | Source: Ambulatory Visit | Attending: Nurse Practitioner | Admitting: Nurse Practitioner

## 2017-10-23 DIAGNOSIS — I481 Persistent atrial fibrillation: Secondary | ICD-10-CM | POA: Insufficient documentation

## 2017-10-23 DIAGNOSIS — I10 Essential (primary) hypertension: Secondary | ICD-10-CM | POA: Diagnosis not present

## 2017-10-23 DIAGNOSIS — I4819 Other persistent atrial fibrillation: Secondary | ICD-10-CM

## 2017-10-23 NOTE — Progress Notes (Signed)
  Echocardiogram 2D Echocardiogram has been performed.  Pieter PartridgeBrooke S Strickland 10/23/2017, 8:51 AM

## 2017-11-02 ENCOUNTER — Encounter: Payer: Self-pay | Admitting: Internal Medicine

## 2017-11-02 ENCOUNTER — Ambulatory Visit (INDEPENDENT_AMBULATORY_CARE_PROVIDER_SITE_OTHER): Payer: 59 | Admitting: Internal Medicine

## 2017-11-02 VITALS — BP 102/70 | HR 71 | Ht 63.0 in | Wt 300.2 lb

## 2017-11-02 DIAGNOSIS — I10 Essential (primary) hypertension: Secondary | ICD-10-CM

## 2017-11-02 DIAGNOSIS — I481 Persistent atrial fibrillation: Secondary | ICD-10-CM | POA: Diagnosis not present

## 2017-11-02 DIAGNOSIS — I4819 Other persistent atrial fibrillation: Secondary | ICD-10-CM

## 2017-11-02 NOTE — Progress Notes (Signed)
PCP: Laurann MontanaWhite, Cynthia, MD   Primary EP: Dr Johney FrameAllred  Dale DurhamPamela H Koch is a 62 y.o. female who presents today for routine electrophysiology followup.  Since last being seen in our clinic, the patient reports doing reasonably well.  She did not maintain sinus rhythm with cardioversion.  She did not feel any real symptomatic improvement with sinus.  Her primary limitation is with L foot pain.  Today, she denies symptoms of palpitations, chest pain, shortness of breath,  lower extremity edema, dizziness, presyncope, or syncope.  The patient is otherwise without complaint today.   Past Medical History:  Diagnosis Date  . Atrial fibrillation (HCC)   . Chronic lower back pain   . Depression   . DVT (deep venous thrombosis) (HCC) 1990s   LLE  . Hypertension   . Hyperthyroidism ~ 2000   "fine now" (04/23/2016)  . Obesity   . Pneumonia 1980s X 1  . Snoring    has not had sleep study but suspects sleep apnea   Past Surgical History:  Procedure Laterality Date  . CARDIOVERSION N/A 06/18/2016   Procedure: CARDIOVERSION;  Surgeon: Laurey Moralealton S McLean, MD;  [facility];  Service: Cardiovascular;  Laterality: N/A;  . CARDIOVERSION N/A 07/11/2016   Procedure: CARDIOVERSION;  Surgeon: Lewayne BuntingBrian S Crenshaw, MD;  [facility];  Service: Cardiovascular;  Laterality: N/A;  . CARDIOVERSION N/A 07/02/2017   Procedure: CARDIOVERSION;  Surgeon: Thurmon Fairroitoru, Mihai, MD;  Location: [facility];  Service: Cardiovascular;  Laterality: N/A;  . CARDIOVERSION N/A 09/29/2017   Procedure: CARDIOVERSION;  Surgeon: Laurey MoraleMcLean, Dalton S, MD;  [facility];  Service: Cardiovascular;  Laterality: N/A;  . ENDOMETRIAL ABLATION  ~2006  . TONSILLECTOMY  1960s  . TUBAL LIGATION  1979    ROS- all systems are reviewed and negatives except as per HPI above  Current Outpatient Medications  Medication Sig Dispense Refill  . amLODipine (NORVASC) 5 MG tablet Take 5 mg by mouth daily.   0  . clonazePAM (KLONOPIN) 0.5  MG tablet Take 0.5 mg by mouth 3 (three) times daily as needed for anxiety.   0  . DULoxetine (CYMBALTA) 30 MG capsule Take 90 mg by mouth daily.    Marland Kitchen. losartan-hydrochlorothiazide (HYZAAR) 100-25 MG tablet Take 1 tablet by mouth daily.    . pantoprazole (PROTONIX) 40 MG tablet Take 1 tablet (40 mg total) by mouth daily. 30 tablet 1  . traMADol (ULTRAM) 50 MG tablet Take 50 mg by mouth 3 (three) times daily as needed for moderate pain.   0  . XARELTO 20 MG TABS tablet Take 20 mg by mouth daily with supper.     . metoprolol tartrate (LOPRESSOR) 25 MG tablet Take 0.5 tablets (12.5 mg total) by mouth 2 (two) times daily. 60 tablet 3   No current facility-administered medications for this visit.     Physical Exam: Vitals:   11/02/17 1247  BP: 102/70  Pulse: 71  Weight: (!) 300 lb 3.2 oz (136.2 kg)  Height: 5\' 3"  (1.6 m)    GEN- The patient is obese appearing, alert and oriented x 3 today.   Head- normocephalic, atraumatic Eyes-  Sclera clear, conjunctiva pink Ears- hearing intact Oropharynx- clear Lungs- Clear to ausculation bilaterally, normal work of breathing Heart- Regular rate and rhythm, no murmurs, rubs or gallops, PMI not laterally displaced GI- soft, NT, ND, + BS Extremities- no clubbing, cyanosis, or edema, L foot is in a boot  EKG tracing ordered today is personally reviewed and shows atrial  fibrillation 71 bpm, nonspecific ST/T changes  Assessment and Plan:  1. Persistent atrial fibrillation She has failed medical therapy with flecainide and multaq. chasd2vasc score is 2.  She is only mildly symptomatic.  Given her morbid obesity, I worry that our ability to achieve/ maintain sinus rhythm long term is low.  I would advise rate control as her long term strategy.  2. Obesity Body mass index is 53.18 kg/m. Lifestyle modification again discussed today I have advised that she consider bariatric surgery consultation.  She will consider this.  3. HTN Stable No change  required today  4. Snoring She did not have sleep study as I advised  5. preop She wishes to have L ankle surgery.  OK to proceed from my standpoint.  Follow-up in AF clinic in 3 months I will see as needed  Hillis Range MD, [facility] 11/02/2017 1:02 PM

## 2017-11-02 NOTE — Patient Instructions (Signed)
Medication Instructions:  Your physician recommends that you continue on your current medications as directed. Please refer to the Current Medication list given to you today.   Labwork: None   Testing/Procedures: None   Follow-Up: Your physician recommends that you schedule a follow-up appointment in: 3 months in AFIB Clinic with Rudi Cocoonna Carroll, NP.   Any Other Special Instructions Will Be Listed Below (If Applicable).     If you need a refill on your cardiac medications before your next appointment, please call your pharmacy.

## 2017-11-03 NOTE — Addendum Note (Signed)
Encounter addended by: Newman Niparroll, Donna C, NP on: 11/03/2017 9:17 AM  Actions taken: Sign clinical note

## 2017-12-30 ENCOUNTER — Other Ambulatory Visit: Payer: Self-pay | Admitting: Endocrinology

## 2017-12-30 DIAGNOSIS — R7989 Other specified abnormal findings of blood chemistry: Secondary | ICD-10-CM

## 2018-01-18 ENCOUNTER — Ambulatory Visit
Admission: RE | Admit: 2018-01-18 | Discharge: 2018-01-18 | Disposition: A | Discharge status: Home / Self Care | Payer: 59 | Source: Ambulatory Visit | Attending: Endocrinology | Admitting: Endocrinology

## 2018-01-18 DIAGNOSIS — R7989 Other specified abnormal findings of blood chemistry: Secondary | ICD-10-CM

## 2018-01-18 MED ORDER — GADOBENATE DIMEGLUMINE 529 MG/ML IV SOLN
10.0000 mL | Freq: Once | INTRAVENOUS | Status: AC | PRN
  Administered 2018-01-18: 10 mL via INTRAVENOUS

## 2018-02-04 ENCOUNTER — Ambulatory Visit (HOSPITAL_COMMUNITY)
Admission: RE | Admit: 2018-02-04 | Discharge: 2018-02-04 | Disposition: A | Discharge status: Home / Self Care | Payer: 59 | Source: Ambulatory Visit | Attending: Nurse Practitioner | Admitting: Nurse Practitioner

## 2018-02-04 ENCOUNTER — Encounter (INDEPENDENT_AMBULATORY_CARE_PROVIDER_SITE_OTHER): Payer: 59

## 2018-02-04 ENCOUNTER — Encounter (HOSPITAL_COMMUNITY): Payer: Self-pay | Admitting: Nurse Practitioner

## 2018-02-04 VITALS — BP 112/78 | HR 71 | Ht 63.0 in | Wt 302.8 lb

## 2018-02-04 DIAGNOSIS — Z8249 Family history of ischemic heart disease and other diseases of the circulatory system: Secondary | ICD-10-CM | POA: Insufficient documentation

## 2018-02-04 DIAGNOSIS — R0683 Snoring: Secondary | ICD-10-CM | POA: Diagnosis not present

## 2018-02-04 DIAGNOSIS — Z86718 Personal history of other venous thrombosis and embolism: Secondary | ICD-10-CM | POA: Insufficient documentation

## 2018-02-04 DIAGNOSIS — Z87891 Personal history of nicotine dependence: Secondary | ICD-10-CM | POA: Insufficient documentation

## 2018-02-04 DIAGNOSIS — Z79899 Other long term (current) drug therapy: Secondary | ICD-10-CM | POA: Diagnosis not present

## 2018-02-04 DIAGNOSIS — Z7901 Long term (current) use of anticoagulants: Secondary | ICD-10-CM | POA: Insufficient documentation

## 2018-02-04 DIAGNOSIS — Z823 Family history of stroke: Secondary | ICD-10-CM | POA: Insufficient documentation

## 2018-02-04 DIAGNOSIS — I10 Essential (primary) hypertension: Secondary | ICD-10-CM | POA: Insufficient documentation

## 2018-02-04 DIAGNOSIS — I4819 Other persistent atrial fibrillation: Secondary | ICD-10-CM

## 2018-02-04 DIAGNOSIS — F329 Major depressive disorder, single episode, unspecified: Secondary | ICD-10-CM | POA: Diagnosis not present

## 2018-02-04 DIAGNOSIS — I481 Persistent atrial fibrillation: Secondary | ICD-10-CM | POA: Diagnosis not present

## 2018-02-04 NOTE — Progress Notes (Signed)
Primary Care Physician: Laurann Montana, MD Referring Physician: Dr. Leonia Corona is a 62 y.o. female with a h/o PAF, obesity, HTN, ambulatory issues due to severe fallen arch of left foot, in afib clinic today for f/u. Pt had held in SR for a long time in [place] but then had breakthrough afib.She was cardioverted with ERAF. Multaq was washed out and flecainde was started. She was suppose to return in October for cardioversion after starting flecainde, but her mother got sick and died, and then her granddaughter whom she raises had to have colon surgery,  F/u in afib clinic 12/17, after successful cardioversion 12/4. She is not sure when she went back into afib, but has not felt as well for the last few days. She has now failed multaq and flecainide. Discussed options to restore SR.  She had f/u with Dr. Johney Frame in January for options. He did not think she was overly symptomatic in afib and thought it would be best to purse weight loss/bariatric surgery. In the clinic today, she has tried very hard, working with her PCP to losse weight. She has cut her calories as low as 1400 cals a day and is very frustrated/upset that she has not been able loose any weight. She is very limited in mobility due to her left foot issues.   Today, she denies symptoms of palpitations, chest pain, shortness of breath, orthopnea, PND, lower extremity edema, dizziness, presyncope, syncope, or neurologic sequela. + for not sleeping well and fatigue.The patient is tolerating medications without difficulties and is otherwise without complaint today.   Past Medical History:  Diagnosis Date  . Atrial fibrillation (HCC)   . Chronic lower back pain   . Depression   . DVT (deep venous thrombosis) (HCC) 1990s   LLE  . Hypertension   . Hyperthyroidism ~ 2000   "fine now" (04/23/2016)  . Obesity   . Pneumonia 1980s X 1  . Snoring    has not had sleep study but suspects sleep apnea   Past Surgical History:   Procedure Laterality Date  . CARDIOVERSION N/A 06/18/2016   Procedure: CARDIOVERSION;  Surgeon: Laurey Morale, MD;  Location: [facility] ENDOSCOPY;  Service: Cardiovascular;  Laterality: N/A;  . CARDIOVERSION N/A 07/11/2016   Procedure: CARDIOVERSION;  Surgeon: Lewayne Bunting, MD;  Location: [facility] ENDOSCOPY;  Service: Cardiovascular;  Laterality: N/A;  . CARDIOVERSION N/A 07/02/2017   Procedure: CARDIOVERSION;  Surgeon: Thurmon Fair, MD;  Location: [facility] ENDOSCOPY;  Service: Cardiovascular;  Laterality: N/A;  . CARDIOVERSION N/A 09/29/2017   Procedure: CARDIOVERSION;  Surgeon: Laurey Morale, MD;  Location: [facility] ENDOSCOPY;  Service: Cardiovascular;  Laterality: N/A;  . ENDOMETRIAL ABLATION  ~2006  . TONSILLECTOMY  1960s  . TUBAL LIGATION  1979    Current Outpatient Medications  Medication Sig Dispense Refill  . amLODipine (NORVASC) 5 MG tablet Take 5 mg by mouth daily.   0  . clonazePAM (KLONOPIN) 0.5 MG tablet Take 0.5 mg by mouth 3 (three) times daily as needed for anxiety.   0  . DULoxetine (CYMBALTA) 30 MG capsule Take 90 mg by mouth daily.    Marland Kitchen losartan-hydrochlorothiazide (HYZAAR) 100-25 MG tablet Take 1 tablet by mouth daily.    . metoprolol tartrate (LOPRESSOR) 25 MG tablet Take 0.5 tablets (12.5 mg total) by mouth 2 (two) times daily. 60 tablet 3  . pantoprazole (PROTONIX) 40 MG tablet Take 1 tablet (40 mg total) by mouth daily. 30 tablet 1  .  traMADol (ULTRAM) 50 MG tablet Take 50 mg by mouth 3 (three) times daily as needed for moderate pain.   0  . XARELTO 20 MG TABS tablet Take 20 mg by mouth daily with supper.      No current facility-administered medications for this encounter.     Allergies  Allergen Reactions  . Penicillins Hives, Shortness Of Breath and Other (See Comments)    Has patient had a PCN reaction causing immediate rash, facial/tongue/throat swelling, SOB or lightheadedness with hypotension: Yes Has patient had a PCN reaction causing severe rash involving mucus  membranes or skin necrosis: No Has patient had a PCN reaction that required hospitalization: No Has patient had a PCN reaction occurring within the last 10 years: No If all of the above answers are "NO", then may proceed with Cephalosporin use.   . Codeine Hives    Social History   Socioeconomic History  . Marital status: Married    Spouse name: TERRY  . Number of children: 2  . Years of education: Not on file  . Highest education level: Not on file  Occupational History  . Occupation: Land  Social Needs  . Financial resource strain: Not on file  . Food insecurity:    Worry: Not on file    Inability: Not on file  . Transportation needs:    Medical: Not on file    Non-medical: Not on file  Tobacco Use  . Smoking status: Former Smoker    Packs/day: 0.75    Years: 10.00    Pack years: 7.50    Types: Cigarettes  . Smokeless tobacco: Never Used  . Tobacco comment: 04/23/2016 "stopped smoking cigarettes in ~ 1989"  Substance and Sexual Activity  . Alcohol use: Yes    Alcohol/week: 2.4 oz    Types: 4 Glasses of wine per week    Comment: recently quit  . Drug use: Yes    Types: Marijuana    Comment: 04/23/2016 "recreational marijuana in my late teens/early 20's"  . Sexual activity: Not Currently  Lifestyle  . Physical activity:    Days per week: Not on file    Minutes per session: Not on file  . Stress: Not on file  Relationships  . Social connections:    Talks on phone: Not on file    Gets together: Not on file    Attends religious service: Not on file    Active member of club or organization: Not on file    Attends meetings of clubs or organizations: Not on file    Relationship status: Not on file  . Intimate partner violence:    Fear of current or ex partner: Not on file    Emotionally abused: Not on file    Physically abused: Not on file    Forced sexual activity: Not on file  Other Topics Concern  . Not on file  Social History Narrative   Lives in  [place] with husband and 3 grandchildren.   Self employed office cleaner.    Family History  Problem Relation Age of Onset  . Diabetes Mellitus I Father   . CVA Father   . Arthritis Mother   . Obesity Mother   . CAD Maternal Grandfather     ROS- All systems are reviewed and negative except as per the HPI above  Physical Exam: Vitals:   02/04/18 0842  BP: 112/78  Pulse: 71  Weight: (!) 302 lb 12.8 oz (137.3 kg)  Height: 5\' 3"  (1.6  m)    GEN- The patient is well appearing, alert and oriented x 3 today.   Head- normocephalic, atraumatic Eyes-  Sclera clear, conjunctiva pink Ears- hearing intact Oropharynx- clear Neck- supple, no JVP Lymph- no cervical lymphadenopathy Lungs- Clear to ausculation bilaterally, normal work of breathing Heart- irregular rate and rhythm, no murmurs, rubs or gallops, PMI not laterally displaced GI- soft, NT, ND, + BS Extremities- no clubbing, cyanosis, or trace to 1+ edema MS- no significant deformity or atrophy Skin- no rash or lesion Psych- euthymic mood, full affect Neuro- strength and sensation are intact  EKG- afib at 71 qrs int 120 ms, qtc 435 ms Epic records reviewed Echo-03/2016-Technically difficult study, Definity contrast administered. LVEF   60-65%, grossly normal wall motion, normal wall thickness, aortic   sclerosis with trivial AI, moderate LAE, mild RAE, mild RVE with   mildly reduced RV function, normal IVC.  Pharmacologic 2 day  myoview- 04/23/17  Perfusion: No decreased activity in the left ventricle on stress imaging to suggest reversible ischemia or infarction. Mild breast attenuation of the anterior wall.  Wall Motion: Normal left ventricular wall motion. No left ventricular dilation.  Left Ventricular Ejection Fraction: 54 %  End diastolic volume 114 ml  End systolic volume 52 ml  IMPRESSION: 1. No reversible ischemia or infarction.  2. Normal left ventricular wall motion.  3. Left ventricular  ejection fraction 54%  4. Non invasive risk stratification*: Low   Assessment and Plan:  1. PAF Unsuccessful cardioversion 07/02/17 Multaq washout out and flecainide started Successful cardioversion 12/4 but now with ERAF Now off flecainide  Continue xarelto 20 mg qd, states no missed doses with chadsvasc score of at least 2  Continue  metoprolol 25 mg 1/2 tab bid, well rate controlled Dr. Johney FrameAllred did not feel she was a good candidate for ablation 2/2 weight   2. Obesity Very frustrated in her efforts are not yielding weight loss Inability to be very active, I am sure is playing into this 2/2 orthopedic issues We discussed possible water aerobics Has orthopedic issues which limit her activity Wants to have foot surgery but has not been able to arrange as of yet Will refer her Dr. Quillian Quincearen Beasley for further evaluation and advise re weight loss  3. Snoring Sleep study ordered in past but pt did not carry thru with plans Does feel she snores but does not thinks she has apnea spells  4. HTN Well managed    F/u in 3 months  Elvina SidleDonna C. Matthew Folksarroll, ANP-C Afib Clinic [facility] [address] [place], [place] [address] [phone]

## 2018-02-04 NOTE — Patient Instructions (Signed)
[phone]  Dr. Quillian Quincearen Beasley  [email or web address]

## 2018-02-08 ENCOUNTER — Encounter (INDEPENDENT_AMBULATORY_CARE_PROVIDER_SITE_OTHER): Payer: Self-pay | Admitting: Family Medicine

## 2018-02-08 ENCOUNTER — Ambulatory Visit (INDEPENDENT_AMBULATORY_CARE_PROVIDER_SITE_OTHER): Payer: 59 | Admitting: Family Medicine

## 2018-02-08 VITALS — BP 110/71 | HR 69 | Temp 98.1°F | Ht 63.0 in | Wt 296.0 lb

## 2018-02-08 DIAGNOSIS — Z1331 Encounter for screening for depression: Secondary | ICD-10-CM | POA: Diagnosis not present

## 2018-02-08 DIAGNOSIS — Z6841 Body Mass Index (BMI) 40.0 and over, adult: Secondary | ICD-10-CM

## 2018-02-08 DIAGNOSIS — I482 Chronic atrial fibrillation, unspecified: Secondary | ICD-10-CM

## 2018-02-08 DIAGNOSIS — Z0289 Encounter for other administrative examinations: Secondary | ICD-10-CM

## 2018-02-08 DIAGNOSIS — R5383 Other fatigue: Secondary | ICD-10-CM | POA: Diagnosis not present

## 2018-02-08 DIAGNOSIS — R0602 Shortness of breath: Secondary | ICD-10-CM | POA: Diagnosis not present

## 2018-02-08 DIAGNOSIS — I10 Essential (primary) hypertension: Secondary | ICD-10-CM | POA: Diagnosis not present

## 2018-02-08 DIAGNOSIS — Z9189 Other specified personal risk factors, not elsewhere classified: Secondary | ICD-10-CM

## 2018-02-09 LAB — COMPREHENSIVE METABOLIC PANEL
ALT: 12 IU/L (ref 0–32)
AST: 18 IU/L (ref 0–40)
Albumin/Globulin Ratio: 1.6 (ref 1.2–2.2)
Albumin: 4.1 g/dL (ref 3.5–5.5)
Alkaline Phosphatase: 63 IU/L (ref 39–117)
BUN/Creatinine Ratio: 17 (ref 9–23)
BUN: 15 mg/dL (ref 6–24)
Bilirubin Total: 0.5 mg/dL (ref 0.0–1.2)
CO2: 23 mmol/L (ref 20–29)
Calcium: 9.2 mg/dL (ref 8.7–10.2)
Chloride: 104 mmol/L (ref 96–106)
Creatinine, Ser: 0.87 mg/dL (ref 0.57–1.00)
GFR calc Af Amer: 84 mL/min/{1.73_m2} (ref >59)
GFR calc non Af Amer: 73 mL/min/{1.73_m2} (ref >59)
Globulin, Total: 2.6 g/dL (ref 1.5–4.5)
Glucose: 93 mg/dL (ref 65–99)
Potassium: 4.4 mmol/L (ref 3.5–5.2)
Sodium: 143 mmol/L (ref 134–144)
Total Protein: 6.7 g/dL (ref 6.0–8.5)

## 2018-02-09 LAB — CBC WITH DIFFERENTIAL/PLATELET
Basophils Absolute: 0 10*3/uL (ref 0.0–0.2)
Basos: 0 %
EOS (ABSOLUTE): 0.2 10*3/uL (ref 0.0–0.4)
Eos: 5 %
Hematocrit: 41.8 % (ref 34.0–46.6)
Hemoglobin: 14.4 g/dL (ref 11.1–15.9)
Immature Grans (Abs): 0 10*3/uL (ref 0.0–0.1)
Immature Granulocytes: 0 %
Lymphocytes Absolute: 1.3 10*3/uL (ref 0.7–3.1)
Lymphs: 25 %
MCH: 27.6 pg (ref 26.6–33.0)
MCHC: 34.4 g/dL (ref 31.5–35.7)
MCV: 80 fL (ref 79–97)
Monocytes Absolute: 0.6 10*3/uL (ref 0.1–0.9)
Monocytes: 12 %
Neutrophils Absolute: 2.9 10*3/uL (ref 1.4–7.0)
Neutrophils: 58 %
Platelets: 254 10*3/uL (ref 150–379)
RBC: 5.22 x10E6/uL (ref 3.77–5.28)
RDW: 14.2 % (ref 12.3–15.4)
WBC: 5 10*3/uL (ref 3.4–10.8)

## 2018-02-09 LAB — FOLATE: Folate: 12.4 ng/mL (ref >3.0)

## 2018-02-09 LAB — HEMOGLOBIN A1C
Est. average glucose Bld gHb Est-mCnc: 117 mg/dL
Hgb A1c MFr Bld: 5.7 % — ABNORMAL HIGH (ref 4.8–5.6)

## 2018-02-09 LAB — LIPID PANEL
Chol/HDL Ratio: 2.2 ratio (ref 0.0–4.4)
Cholesterol, Total: 166 mg/dL (ref 100–199)
HDL: 75 mg/dL (ref >39)
LDL Calculated: 76 mg/dL (ref 0–99)
Triglycerides: 75 mg/dL (ref 0–149)
VLDL Cholesterol Cal: 15 mg/dL (ref 5–40)

## 2018-02-09 LAB — T4, FREE: Free T4: 1.47 ng/dL (ref 0.82–1.77)

## 2018-02-09 LAB — TSH: TSH: 4.8 u[IU]/mL — ABNORMAL HIGH (ref 0.450–4.500)

## 2018-02-09 LAB — T3: T3, Total: 119 ng/dL (ref 71–180)

## 2018-02-09 LAB — INSULIN, RANDOM: INSULIN: 5.8 u[IU]/mL (ref 2.6–24.9)

## 2018-02-09 LAB — VITAMIN B12: Vitamin B-12: 516 pg/mL (ref 232–1245)

## 2018-02-09 LAB — VITAMIN D 25 HYDROXY (VIT D DEFICIENCY, FRACTURES): Vit D, 25-Hydroxy: 14.6 ng/mL — ABNORMAL LOW (ref 30.0–100.0)

## 2018-02-11 NOTE — Progress Notes (Signed)
Office: [phone]  /  Fax: [phone]   Dear Dr. Johney Koch,   Thank you for referring [name] to our clinic. The following note includes my evaluation and treatment recommendations.  HPI:   Chief Complaint: OBESITY    [name] has been referred by Abigail Range, MD for consultation regarding her obesity and obesity related comorbidities.    [name] (MR# [id number]) is a 62 y.o. female who presents on 02/08/2018 for obesity evaluation and treatment. Current BMI is Body mass index is 52.43 kg/m.Marland Kitchen [name] has been struggling with her weight for many years and has been unsuccessful in either losing weight, maintaining weight loss, or reaching her healthy weight goal.     [name] takes care of her 3 grandchildren who often dictate what the family eats. One granddaughter in particular is picky and only eats fast food and chips, and another granddaughter has Crohn's disease. She also describes significant sabotage from her husband.     [name] attended our information session and states she is currently in the action stage of change and ready to dedicate time achieving and maintaining a healthier weight. [name] is interested in becoming our patient and working on intensive lifestyle modifications including (but not limited to) diet, exercise and weight loss.    [name] states her family eats meals together she struggles with family and or coworkers weight loss sabotage she has been heavy most of  her life her heaviest weight ever was 311 lbs she has significant food cravings issues  she skips meals frequently she is frequently drinking liquids with calories she frequently makes poor food choices she has problems with excessive hunger  she frequently eats larger portions than normal  she struggles with emotional eating    [name] feels her energy is lower than it should be. This has worsened with weight gain and has not worsened recently. [name] admits to  daytime somnolence and  admits to waking up still tired. Patient is at risk for obstructive sleep apnea. Patent has a history of symptoms of daytime Abigail. Patient generally gets 7 hours of sleep per night, and states they generally have generally restful sleep. Snoring is present. Apneic episodes are not present. Epworth Sleepiness Score is 4.  Dyspnea on exertion [name] notes increasing shortness of breath with exercising and seems to be worsening over time with weight gain. She notes getting out of breath sooner with activity than she used to. This has not gotten worse recently. [name] denies orthopnea.  Atrial Fibrillation [name] has struggled to control her atrial fibrillation and was referred to [place] by her Cardiologist Dr. Johney Koch. She denies feeling tachycardic now. She is on metoprolol, Xarelto, and amlodipine.  Hypertension [name] is a 62 y.o. female with hypertension. [name]'s blood pressure is controlled today on medications. She denies chest pain or headache. She would like to control blood pressure with decreasing medications. She is working weight loss to help control her blood pressure with the goal of decreasing her risk of heart attack and stroke. [name]'s blood pressure is currently controlled.  At risk for cardiovascular disease [name] is at a higher than average risk for cardiovascular disease due to obesity, atrial fibrillation, and hypertension. She currently denies any chest pain.  Depression Screen [name]'s Food and Mood (modified PHQ-9) score was  Depression screen PHQ 2/9 02/08/2018  Decreased Interest 3  Down, Depressed, Hopeless 3  PHQ - 2 Score 6  Altered sleeping 1  Tired, decreased energy 3  Change  in appetite 2  Feeling bad or failure about yourself  3  Trouble concentrating 2  Moving slowly or fidgety/restless 3  Suicidal thoughts 0  PHQ-9 Score 20  Difficult doing work/chores Very difficult    ALLERGIES: Allergies  Allergen Reactions  .  Penicillins Hives, Shortness Of Breath and Other (See Comments)    Has patient had a PCN reaction causing immediate rash, facial/tongue/throat swelling, SOB or lightheadedness with hypotension: Yes Has patient had a PCN reaction causing severe rash involving mucus membranes or skin necrosis: No Has patient had a PCN reaction that required hospitalization: No Has patient had a PCN reaction occurring within the last 10 years: No If all of the above answers are "NO", then may proceed with Cephalosporin use.   . Codeine Hives    MEDICATIONS: Current Outpatient Medications on File Prior to Visit  Medication Sig Dispense Refill  . amLODipine (NORVASC) 5 MG tablet Take 5 mg by mouth daily.   0  . clonazePAM (KLONOPIN) 0.5 MG tablet Take 0.5 mg by mouth 3 (three) times daily as needed for anxiety.   0  . DULoxetine (CYMBALTA) 30 MG capsule Take 90 mg by mouth daily.    Marland Kitchen losartan-hydrochlorothiazide (HYZAAR) 100-25 MG tablet Take 1 tablet by mouth daily.    . metoprolol tartrate (LOPRESSOR) 25 MG tablet Take 0.5 tablets (12.5 mg total) by mouth 2 (two) times daily. 60 tablet 3  . pantoprazole (PROTONIX) 40 MG tablet Take 1 tablet (40 mg total) by mouth daily. 30 tablet 1  . traMADol (ULTRAM) 50 MG tablet Take 50 mg by mouth 3 (three) times daily as needed for moderate pain.   0  . XARELTO 20 MG TABS tablet Take 20 mg by mouth daily with supper.      No current facility-administered medications on file prior to visit.     PAST MEDICAL HISTORY: Past Medical History:  Diagnosis Date  . Atrial fibrillation (HCC)   . Chronic lower back pain   . Depression   . DVT (deep venous thrombosis) (HCC) 1990s   LLE  . Fallen arches   . Hypertension   . Hyperthyroidism ~ 2000   "fine now" (04/23/2016)  . Joint pain   . Lactose intolerance   . Leg edema   . Obesity   . Pneumonia 1980s X 1  . Snoring    has not had sleep study but suspects sleep apnea    PAST SURGICAL HISTORY: Past Surgical  History:  Procedure Laterality Date  . CARDIOVERSION N/A 06/18/2016   Procedure: CARDIOVERSION;  Surgeon: Laurey Morale, MD;  Location: [facility] ENDOSCOPY;  Service: Cardiovascular;  Laterality: N/A;  . CARDIOVERSION N/A 07/11/2016   Procedure: CARDIOVERSION;  Surgeon: Lewayne Bunting, MD;  Location: [facility] ENDOSCOPY;  Service: Cardiovascular;  Laterality: N/A;  . CARDIOVERSION N/A 07/02/2017   Procedure: CARDIOVERSION;  Surgeon: Thurmon Fair, MD;  Location: [facility] ENDOSCOPY;  Service: Cardiovascular;  Laterality: N/A;  . CARDIOVERSION N/A 09/29/2017   Procedure: CARDIOVERSION;  Surgeon: Laurey Morale, MD;  Location: [facility] ENDOSCOPY;  Service: Cardiovascular;  Laterality: N/A;  . ENDOMETRIAL ABLATION  ~2006  . TONSILLECTOMY  1960s  . TUBAL LIGATION  1979    SOCIAL HISTORY: Social History   Tobacco Use  . Smoking status: Former Smoker    Packs/day: 0.75    Years: 10.00    Pack years: 7.50    Types: Cigarettes  . Smokeless tobacco: Never Used  . Tobacco comment: 04/23/2016 "stopped smoking cigarettes in ~ 1989"  Substance Use Topics  . Alcohol use: Yes    Alcohol/week: 2.4 oz    Types: 4 Glasses of wine per week    Comment: recently quit  . Drug use: Yes    Types: Marijuana    Comment: 04/23/2016 "recreational marijuana in my late teens/early 20's"    FAMILY HISTORY: Family History  Problem Relation Age of Onset  . Diabetes Mellitus I Father   . CVA Father   . Arthritis Mother   . Obesity Mother   . Thyroid disease Mother   . Liver disease Mother   . CAD Maternal Grandfather     ROS: Review of Systems  Constitutional: Positive for chills and malaise/Abigail. Negative for weight loss.  HENT:       + Nasal stuffiness + Dry mouth  Eyes:       + Wear glasses or contacts  Respiratory: Positive for shortness of breath (with exertion).   Cardiovascular:       + Very cold feet or hands  Gastrointestinal: Positive for constipation and diarrhea.  Musculoskeletal:       + Neck  stiffness  Skin:       + Dryness  Neurological: Positive for headaches.  Endo/Heme/Allergies: Bruises/bleeds easily.       + Heat/cold intolerance  Psychiatric/Behavioral: Positive for depression. Negative for suicidal ideas.       + Stress    PHYSICAL EXAM: Blood pressure 110/71, pulse 69, temperature 98.1 F (36.7 C), temperature source Oral, height 5\' 3"  (1.6 m), weight 296 lb (134.3 kg), SpO2 98 %. Body mass index is 52.43 kg/m. Physical Exam  Constitutional: She is oriented to person, place, and time. She appears well-developed and well-nourished.  HENT:  Head: Normocephalic and atraumatic.  Nose: Nose normal.  Eyes: EOM are normal. No scleral icterus.  Neck: Normal Koch of motion. Neck supple. No thyromegaly present.  Cardiovascular: Normal rate and regular rhythm.  Not in a-Fib currently  Pulmonary/Chest: Effort normal. No respiratory distress.  Abdominal: Soft. There is no tenderness.  + Obesity  Musculoskeletal:  Koch of Motion normal in all 4 extremities Trace edema noted in bilateral lowe extremities  Neurological: She is alert and oriented to person, place, and time. Coordination normal.  Skin: Skin is warm and dry.  Psychiatric: She has a normal mood and affect. Her behavior is normal.  Vitals reviewed.   RECENT LABS AND TESTS: BMET    Component Value Date/Time   NA 143 02/08/2018 1011   K 4.4 02/08/2018 1011   CL 104 02/08/2018 1011   CO2 23 02/08/2018 1011   GLUCOSE 93 02/08/2018 1011   GLUCOSE 80 09/24/2017 0845   BUN 15 02/08/2018 1011   CREATININE 0.87 02/08/2018 1011   CREATININE 0.66 06/13/2016 1432   CALCIUM 9.2 02/08/2018 1011   GFRNONAA 73 02/08/2018 1011   GFRAA 84 02/08/2018 1011   Lab Results  Component Value Date   HGBA1C 5.7 (H) 02/08/2018   Lab Results  Component Value Date   INSULIN 5.8 02/08/2018   CBC    Component Value Date/Time   WBC 5.0 02/08/2018 1011   WBC 5.6 09/24/2017 0845   RBC 5.22 02/08/2018 1011   RBC  5.38 (H) 09/24/2017 0845   HGB 14.4 02/08/2018 1011   HCT 41.8 02/08/2018 1011   PLT 254 02/08/2018 1011   MCV 80 02/08/2018 1011   MCH 27.6 02/08/2018 1011   MCH 27.0 09/24/2017 0845   MCHC 34.4 02/08/2018 1011   MCHC 32.7 09/24/2017  0845   RDW 14.2 02/08/2018 1011   LYMPHSABS 1.3 02/08/2018 1011   MONOABS 528 06/13/2016 1432   EOSABS 0.2 02/08/2018 1011   BASOSABS 0.0 02/08/2018 1011   Iron/TIBC/Ferritin/ %Sat No results found for: IRON, TIBC, FERRITIN, IRONPCTSAT Lipid Panel     Component Value Date/Time   CHOL 166 02/08/2018 1011   TRIG 75 02/08/2018 1011   HDL 75 02/08/2018 1011   CHOLHDL 2.2 02/08/2018 1011   LDLCALC 76 02/08/2018 1011   Hepatic Function Panel     Component Value Date/Time   PROT 6.7 02/08/2018 1011   ALBUMIN 4.1 02/08/2018 1011   AST 18 02/08/2018 1011   ALT 12 02/08/2018 1011   ALKPHOS 63 02/08/2018 1011   BILITOT 0.5 02/08/2018 1011      Component Value Date/Time   TSH 4.800 (H) 02/08/2018 1011   TSH 6.839 (H) 10/12/2017 0931   TSH 4.949 (H) 06/26/2017 0905   TSH 3.873 04/22/2016 1721    ECG  shows NSR with a rate of 71 BPM INDIRECT CALORIMETER done today shows a VO2 of 266 and a REE of 1850.  Her calculated basal metabolic rate is 1610 thus her basal metabolic rate is better than expected.    ASSESSMENT AND PLAN: Other Abigail - Plan: Comprehensive metabolic panel, CBC with Differential/Platelet, Hemoglobin A1c, Insulin, random, T3, T4, free, TSH, Vitamin B12, VITAMIN D 25 Hydroxy (Vit-D Deficiency, Fractures), Folate, CANCELED: EKG 12-Lead  Shortness of breath on exertion  Chronic atrial fibrillation (HCC) - Plan: Lipid panel  Essential hypertension  Screening for depression  At risk for heart disease  Class 3 severe obesity with serious comorbidity and body mass index (BMI) of 50.0 to 59.9 in adult, unspecified obesity type (HCC)  PLAN:  Abigail Miasha was informed that her Abigail may be related to obesity, depression or  many other causes. Labs will be ordered, and in the meanwhile Aashka has agreed to work on diet, exercise and weight loss to help with Abigail. Proper sleep hygiene was discussed including the need for 7-8 hours of quality sleep each night. A sleep study was not ordered based on symptoms and Epworth score.  Dyspnea on exertion [name]'s shortness of breath appears to be obesity related and exercise induced. She has agreed to work on weight loss and gradually increase exercise to treat her exercise induced shortness of breath. If [name] follows our instructions and loses weight without improvement of her shortness of breath, we will plan to refer to pulmonology. We will monitor this condition regularly. [name] agrees to this plan.  Atrial Fibrillation [name] was advised of importance of weight loss to her overall health but especially to her heart health. [name] agrees to follow up with our clinic in 2 weeks.  Hypertension We discussed sodium restriction, working on healthy weight loss, and a regular exercise program as the means to achieve improved blood pressure control. Roselynn agreed with this plan and agreed to follow up as directed. We will continue to monitor her blood pressure as well as her progress with the above lifestyle modifications. She will continue her medications as prescribed and will watch for signs of hypotension as she continues her lifestyle modifications. Jalonda will start diet and exercise, we will check labs, and she agrees to follow up with our clinic in 2 weeks.  Cardiovascular risk counselling Pansie was given extended (15 minutes) coronary artery disease prevention counseling today. She is 62 y.o. female and has risk factors for heart disease including obesity, atrial fibrillation, and  hypertension. We discussed intensive lifestyle modifications today with an emphasis on specific weight loss instructions and strategies. Pt was also informed of the importance of increasing  exercise and decreasing saturated fats to help prevent heart disease.  Depression Screen Abrianna had a strongly positive depression screening. Depression is commonly associated with obesity and often results in emotional eating behaviors. We will monitor this closely and work on CBT to help improve the non-hunger eating patterns. Referral to Psychology may be required if no improvement is seen as she continues in our clinic.  Obesity Mayleigh is currently in the action stage of change and her goal is to continue with weight loss efforts. I recommend Genesys begin the structured treatment plan as follows:  She has agreed to follow the Category 2 plan + 100 calories Maryclare has been instructed to eventually work up to a goal of 150 minutes of combined cardio and strengthening exercise per week for weight loss and overall health benefits. We discussed the following Behavioral Modification Strategies today: increasing lean protein intake, decreasing simple carbohydrates , decrease eating out and dealing with family or coworker sabotage   She was informed of the importance of frequent follow up visits to maximize her success with intensive lifestyle modifications for her multiple health conditions. She was informed we would discuss her lab results at her next visit unless there is a critical issue that needs to be addressed sooner. Shawntavia agreed to keep her next visit at the agreed upon time to discuss these results.    OBESITY BEHAVIORAL INTERVENTION VISIT  Today's visit was # 1 out of 22.  Starting weight: 296 lbs Starting date: 02/08/18 Today's weight : 296 lbs  Today's date: 02/08/2018 Total lbs lost to date: 0 (Patients must lose 7 lbs in the first 6 months to continue with counseling)   ASK: We discussed the diagnosis of obesity with Abigail Jessie today and Denym agreed to give [place] permission to discuss obesity behavioral modification therapy today.  ASSESS: Prudence has the diagnosis of  obesity and her BMI today is 52.45 Glennda is in the action stage of change   ADVISE: Kamyiah was educated on the multiple health risks of obesity as well as the benefit of weight loss to improve her health. She was advised of the need for long term treatment and the importance of lifestyle modifications.  AGREE: Multiple dietary modification options and treatment options were discussed and  Zuleica agreed to the above obesity treatment plan.   I, Burt Knack, am acting as transcriptionist for Quillian Quince, MD  I have reviewed the above documentation for accuracy and completeness, and I agree with the above. -Quillian Quince, MD

## 2018-02-22 ENCOUNTER — Ambulatory Visit (INDEPENDENT_AMBULATORY_CARE_PROVIDER_SITE_OTHER): Payer: 59 | Admitting: Family Medicine

## 2018-02-22 VITALS — BP 105/72 | HR 70 | Temp 97.8°F | Ht 63.0 in | Wt 285.0 lb

## 2018-02-22 DIAGNOSIS — Z6841 Body Mass Index (BMI) 40.0 and over, adult: Secondary | ICD-10-CM

## 2018-02-22 DIAGNOSIS — E559 Vitamin D deficiency, unspecified: Secondary | ICD-10-CM | POA: Diagnosis not present

## 2018-02-22 DIAGNOSIS — R7303 Prediabetes: Secondary | ICD-10-CM

## 2018-02-22 DIAGNOSIS — Z9189 Other specified personal risk factors, not elsewhere classified: Secondary | ICD-10-CM | POA: Diagnosis not present

## 2018-02-22 MED ORDER — VITAMIN D (ERGOCALCIFEROL) 1.25 MG (50000 UNIT) PO CAPS: 50000.0000 [IU] | ORAL_CAPSULE | ORAL | 0 refills | Status: DC

## 2018-02-24 NOTE — Progress Notes (Signed)
Office: [phone]  /  Fax: [phone]   HPI:   Chief Complaint: OBESITY [name] is here to discuss her progress with her obesity treatment plan. She is on the Category 2 plan + 100 calories and is following her eating plan approximately 50 % of the time. She states she is exercising 0 minutes 0 times per week. Ralynn has done very well with weight loss but struggled to follow the plan precisely and sometimes skipped meals especially lunch. She is consistently eating less than her meal plan requires.  Her weight is 285 lb (129.3 kg) today and has had a weight loss of 11 pounds over a period of 2 weeks since her last visit. She has lost 11 lbs since starting treatment with [place].  Vitamin D Deficiency [name] has a new diagnosis of vitamin D deficiency. She is not on Vit D, she notes fatigue and denies nausea, vomiting or muscle weakness.  Pre-Diabetes [name] has a new diagnosis of pre-diabetes based on her elevated Hgb A1c and was informed this puts her at greater risk of developing diabetes. A1c is elevated, had some polyphagia which improved with Category 2 plan. She is doing well with her new eating plan. She is not taking metformin currently and continues to work on diet and exercise to decrease risk of diabetes. She denies nausea or hypoglycemia.  At risk for diabetes [name] is at higher than average risk for developing diabetes due to her obesity and pre-diabetes. She currently denies polyuria or polydipsia.  ALLERGIES: Allergies  Allergen Reactions  . Penicillins Hives, Shortness Of Breath and Other (See Comments)    Has patient had a PCN reaction causing immediate rash, facial/tongue/throat swelling, SOB or lightheadedness with hypotension: Yes Has patient had a PCN reaction causing severe rash involving mucus membranes or skin necrosis: No Has patient had a PCN reaction that required hospitalization: No Has patient had a PCN reaction occurring within the last 10 years: No If  all of the above answers are "NO", then may proceed with Cephalosporin use.   . Codeine Hives    MEDICATIONS: Current Outpatient Medications on File Prior to Visit  Medication Sig Dispense Refill  . amLODipine (NORVASC) 5 MG tablet Take 5 mg by mouth daily.   0  . clonazePAM (KLONOPIN) 0.5 MG tablet Take 0.5 mg by mouth 3 (three) times daily as needed for anxiety.   0  . DULoxetine (CYMBALTA) 30 MG capsule Take 90 mg by mouth daily.    Marland Kitchen losartan-hydrochlorothiazide (HYZAAR) 100-25 MG tablet Take 1 tablet by mouth daily.    . pantoprazole (PROTONIX) 40 MG tablet Take 1 tablet (40 mg total) by mouth daily. 30 tablet 1  . traMADol (ULTRAM) 50 MG tablet Take 50 mg by mouth 3 (three) times daily as needed for moderate pain.   0  . XARELTO 20 MG TABS tablet Take 20 mg by mouth daily with supper.     . metoprolol tartrate (LOPRESSOR) 25 MG tablet Take 0.5 tablets (12.5 mg total) by mouth 2 (two) times daily. 60 tablet 3   No current facility-administered medications on file prior to visit.     PAST MEDICAL HISTORY: Past Medical History:  Diagnosis Date  . Atrial fibrillation (HCC)   . Chronic lower back pain   . Depression   . DVT (deep venous thrombosis) (HCC) 1990s   LLE  . Fallen arches   . Hypertension   . Hyperthyroidism ~ 2000   "fine now" (04/23/2016)  . Joint pain   .  Lactose intolerance   . Leg edema   . Obesity   . Pneumonia 1980s X 1  . Snoring    has not had sleep study but suspects sleep apnea    PAST SURGICAL HISTORY: Past Surgical History:  Procedure Laterality Date  . CARDIOVERSION N/A 06/18/2016   Procedure: CARDIOVERSION;  Surgeon: Laurey Morale, MD;  Location: [facility] ENDOSCOPY;  Service: Cardiovascular;  Laterality: N/A;  . CARDIOVERSION N/A 07/11/2016   Procedure: CARDIOVERSION;  Surgeon: Lewayne Bunting, MD;  Location: [facility] ENDOSCOPY;  Service: Cardiovascular;  Laterality: N/A;  . CARDIOVERSION N/A 07/02/2017   Procedure: CARDIOVERSION;  Surgeon: Thurmon Fair, MD;  Location: [facility] ENDOSCOPY;  Service: Cardiovascular;  Laterality: N/A;  . CARDIOVERSION N/A 09/29/2017   Procedure: CARDIOVERSION;  Surgeon: Laurey Morale, MD;  Location: [facility] ENDOSCOPY;  Service: Cardiovascular;  Laterality: N/A;  . ENDOMETRIAL ABLATION  ~2006  . TONSILLECTOMY  1960s  . TUBAL LIGATION  1979    SOCIAL HISTORY: Social History   Tobacco Use  . Smoking status: Former Smoker    Packs/day: 0.75    Years: 10.00    Pack years: 7.50    Types: Cigarettes  . Smokeless tobacco: Never Used  . Tobacco comment: 04/23/2016 "stopped smoking cigarettes in ~ 1989"  Substance Use Topics  . Alcohol use: Yes    Alcohol/week: 2.4 oz    Types: 4 Glasses of wine per week    Comment: recently quit  . Drug use: Yes    Types: Marijuana    Comment: 04/23/2016 "recreational marijuana in my late teens/early 20's"    FAMILY HISTORY: Family History  Problem Relation Age of Onset  . Diabetes Mellitus I Father   . CVA Father   . Arthritis Mother   . Obesity Mother   . Thyroid disease Mother   . Liver disease Mother   . CAD Maternal Grandfather     ROS: Review of Systems  Constitutional: Positive for malaise/fatigue and weight loss.  Gastrointestinal: Negative for nausea and vomiting.  Genitourinary: Negative for frequency.  Musculoskeletal:       Negative muscle weakness  Endo/Heme/Allergies: Negative for polydipsia.       Positive polyphagia Negative hypoglycemia    PHYSICAL EXAM: Blood pressure 105/72, pulse 70, temperature 97.8 F (36.6 C), temperature source Oral, height  (1.6 m), weight 285 lb (129.3 kg), SpO2 98 %. Body mass index is 50.49 kg/m. Physical Exam  Constitutional: She is oriented to person, place, and time. She appears well-developed and well-nourished.  Cardiovascular: Normal rate.  Pulmonary/Chest: Effort normal.  Musculoskeletal: Normal range of motion.  Neurological: She is oriented to person, place, and time.  Skin: Skin is warm and  dry.  Psychiatric: She has a normal mood and affect. Her behavior is normal.  Vitals reviewed.   RECENT LABS AND TESTS: BMET    Component Value Date/Time   NA 143 02/08/2018 1011   K 4.4 02/08/2018 1011   CL 104 02/08/2018 1011   CO2 23 02/08/2018 1011   GLUCOSE 93 02/08/2018 1011   GLUCOSE 80 09/24/2017 0845   BUN 15 02/08/2018 1011   CREATININE 0.87 02/08/2018 1011   CREATININE 0.66 06/13/2016 1432   CALCIUM 9.2 02/08/2018 1011   GFRNONAA 73 02/08/2018 1011   GFRAA 84 02/08/2018 1011   Lab Results  Component Value Date   HGBA1C 5.7 (H) 02/08/2018   Lab Results  Component Value Date   INSULIN 5.8 02/08/2018   CBC    Component Value Date/Time  WBC 5.0 02/08/2018 1011   WBC 5.6 09/24/2017 0845   RBC 5.22 02/08/2018 1011   RBC 5.38 (H) 09/24/2017 0845   HGB 14.4 02/08/2018 1011   HCT 41.8 02/08/2018 1011   PLT 254 02/08/2018 1011   MCV 80 02/08/2018 1011   MCH 27.6 02/08/2018 1011   MCH 27.0 09/24/2017 0845   MCHC 34.4 02/08/2018 1011   MCHC 32.7 09/24/2017 0845   RDW 14.2 02/08/2018 1011   LYMPHSABS 1.3 02/08/2018 1011   MONOABS 528 06/13/2016 1432   EOSABS 0.2 02/08/2018 1011   BASOSABS 0.0 02/08/2018 1011   Iron/TIBC/Ferritin/ %Sat No results found for: IRON, TIBC, FERRITIN, IRONPCTSAT Lipid Panel     Component Value Date/Time   CHOL 166 02/08/2018 1011   TRIG 75 02/08/2018 1011   HDL 75 02/08/2018 1011   CHOLHDL 2.2 02/08/2018 1011   LDLCALC 76 02/08/2018 1011   Hepatic Function Panel     Component Value Date/Time   PROT 6.7 02/08/2018 1011   ALBUMIN 4.1 02/08/2018 1011   AST 18 02/08/2018 1011   ALT 12 02/08/2018 1011   ALKPHOS 63 02/08/2018 1011   BILITOT 0.5 02/08/2018 1011      Component Value Date/Time   TSH 4.800 (H) 02/08/2018 1011   TSH 6.839 (H) 10/12/2017 0931   TSH 4.949 (H) 06/26/2017 0905   TSH 3.873 04/22/2016 1721  Results for KIMYETTA, FLOTT (MRN [id number]) as of 02/24/2018 09:01  Ref. Range 02/08/2018 10:11  Vitamin  D, 25-Hydroxy Latest Ref Range: 30.0 - 100.0 ng/mL 14.6 (L)    ASSESSMENT AND PLAN: Vitamin D deficiency - Plan: Vitamin D, Ergocalciferol, (DRISDOL) 50000 units CAPS capsule  Prediabetes  At risk for diabetes mellitus  Class 3 severe obesity with serious comorbidity and body mass index (BMI) of 50.0 to 59.9 in adult, unspecified obesity type (HCC)  PLAN:  Vitamin D Deficiency [name] was informed that low vitamin D levels contributes to fatigue and are associated with obesity, breast, and colon cancer. [name] agrees to start prescription Vit D ,000 IU every week #4 with no refills. She will follow up for routine testing of vitamin D, at least 2-3 times per year. She was informed of the risk of over-replacement of vitamin D and agrees to not increase her dose unless she discusses this with [place] first. [name] agrees to follow up with our clinic in 2 to 3 weeks.  Pre-Diabetes [name] will continue to work on weight loss, diet, exercise, and decreasing simple carbohydrates in her diet to help decrease the risk of diabetes. We dicussed metformin including benefits and risks. She was informed that eating too many simple carbohydrates or too many calories at one sitting increases the likelihood of GI side effects. [name] declined metformin for now and a prescription was not written today. [name] agrees to follow up with our clinic in 2 to 3 weeks as directed to monitor her progress.  Diabetes risk counselling [name] was given extended (30 minutes) diabetes prevention counseling today. She is 62 y.o. female and has risk factors for diabetes including obesity and pre-diabetes. We discussed intensive lifestyle modifications today with an emphasis on weight loss as well as increasing exercise and decreasing simple carbohydrates in her diet.  Obesity [name] is currently in the action stage of change. As such, her goal is to continue with weight loss efforts She has agreed to follow the Category 2 plan  + 100 calories, Per [name], encouraged and educated [name] to eat 100% of her meal plan. [name]  has been instructed to work up to a goal of 150 minutes of combined cardio and strengthening exercise per week for weight loss and overall health benefits. We discussed the following Behavioral Modification Strategies today: increasing lean protein intake, decreasing simple carbohydrates, work on meal planning and easy cooking plans, and no skipping meals   Halli has agreed to follow up with our clinic in 2 to 3 weeks. She was informed of the importance of frequent follow up visits to maximize her success with intensive lifestyle modifications for her multiple health conditions.   OBESITY BEHAVIORAL INTERVENTION VISIT  Today's visit was # 2 out of 22.  Starting weight: 296 lbs Starting date: 02/08/18 Today's weight : 285 lbs  Today's date: 02/22/2018 Total lbs lost to date: 11 (Patients must lose 7 lbs in the first 6 months to continue with counseling)   ASK: We discussed the diagnosis of obesity with [name]  today and [name] agreed to give [place] permission to discuss obesity behavioral modification therapy today.  ASSESS: [name] has the diagnosis of obesity and her BMI today is 50.5 [name] is in the action stage of change   ADVISE: [name] was educated on the multiple health risks of obesity as well as the benefit of weight loss to improve her health. She was advised of the need for long term treatment and the importance of lifestyle modifications.  AGREE: Multiple dietary modification options and treatment options were discussed and  [name] agreed to the above obesity treatment plan.  I, Burt Knack, am acting as transcriptionist for Quillian Quince, MD  I have reviewed the above documentation for accuracy and completeness, and I agree with the above. -Quillian Quince, MD

## 2018-03-15 ENCOUNTER — Ambulatory Visit (INDEPENDENT_AMBULATORY_CARE_PROVIDER_SITE_OTHER): Payer: 59 | Admitting: Family Medicine

## 2018-03-15 VITALS — BP 107/75 | HR 71 | Temp 98.1°F | Ht 63.0 in | Wt 280.0 lb

## 2018-03-15 DIAGNOSIS — F3289 Other specified depressive episodes: Secondary | ICD-10-CM | POA: Diagnosis not present

## 2018-03-15 DIAGNOSIS — Z6841 Body Mass Index (BMI) 40.0 and over, adult: Secondary | ICD-10-CM

## 2018-03-15 DIAGNOSIS — E559 Vitamin D deficiency, unspecified: Secondary | ICD-10-CM

## 2018-03-15 DIAGNOSIS — Z9189 Other specified personal risk factors, not elsewhere classified: Secondary | ICD-10-CM | POA: Diagnosis not present

## 2018-03-15 DIAGNOSIS — E66813 Obesity, class 3: Secondary | ICD-10-CM

## 2018-03-15 MED ORDER — BUPROPION HCL ER (SR) 150 MG PO TB12: 150.0000 mg | ORAL_TABLET | Freq: Every day | ORAL | 0 refills | Status: DC

## 2018-03-15 MED ORDER — VITAMIN D (ERGOCALCIFEROL) 1.25 MG (50000 UNIT) PO CAPS: 50000.0000 [IU] | ORAL_CAPSULE | ORAL | 0 refills | Status: DC

## 2018-03-15 NOTE — Progress Notes (Signed)
Office: [phone]  /  Fax: [phone]   HPI:   Chief Complaint: OBESITY [name] is here to discuss her progress with her obesity treatment plan. She is on the Category 2 plan +100 calories and is following her eating plan approximately 95 % of the time. She states she is exercising 0 minutes 0 times per week. [name] continues to do well with weight loss. She states hunger is controlled, but she is still having cravings for sweet and salty. Her weight is 280 lb (127 kg) today and has had a weight loss of 5 pounds over a period of 3 weeks since her last visit. She has lost 16 lbs since starting treatment with [place].  Vitamin D deficiency [name] has a diagnosis of vitamin D deficiency. She is currently taking vit D and denies nausea, vomiting or muscle weakness.  At risk for cardiovascular disease [name] is at a higher than average risk for cardiovascular disease due to obesity. She currently denies any chest pain.  Depression with emotional eating behaviors [name] is on Cymbalta, but she still struggles with emotional eating and some irritability. [name] struggles  with emotional eating and using food for comfort to the extent that it is negatively impacting her health. She often snacks when she is not hungry. [name] sometimes feels she is out of control and then feels guilty that she made poor food choices. She has been working on behavior modification techniques to help reduce her emotional eating and has been somewhat successful. She shows no sign of suicidal or homicidal ideations.  Depression screen PHQ 2/9 02/08/2018  Decreased Interest 3  Down, Depressed, Hopeless 3  PHQ - 2 Score 6  Altered sleeping 1  Tired, decreased energy 3  Change in appetite 2  Feeling bad or failure about yourself  3  Trouble concentrating 2  Moving slowly or fidgety/restless 3  Suicidal thoughts 0  PHQ-9 Score 20  Difficult doing work/chores Very difficult      ALLERGIES: Allergies  Allergen  Reactions  . Penicillins Hives, Shortness Of Breath and Other (See Comments)    Has patient had a PCN reaction causing immediate rash, facial/tongue/throat swelling, SOB or lightheadedness with hypotension: Yes Has patient had a PCN reaction causing severe rash involving mucus membranes or skin necrosis: No Has patient had a PCN reaction that required hospitalization: No Has patient had a PCN reaction occurring within the last 10 years: No If all of the above answers are "NO", then may proceed with Cephalosporin use.   . Codeine Hives    MEDICATIONS: Current Outpatient Medications on File Prior to Visit  Medication Sig Dispense Refill  . amLODipine (NORVASC) 5 MG tablet Take 5 mg by mouth daily.   0  . clonazePAM (KLONOPIN) 0.5 MG tablet Take 0.5 mg by mouth 3 (three) times daily as needed for anxiety.   0  . DULoxetine (CYMBALTA) 30 MG capsule Take 90 mg by mouth daily.    Marland Kitchen losartan-hydrochlorothiazide (HYZAAR) 100-25 MG tablet Take 1 tablet by mouth daily.    . pantoprazole (PROTONIX) 40 MG tablet Take 1 tablet (40 mg total) by mouth daily. 30 tablet 1  . traMADol (ULTRAM) 50 MG tablet Take 50 mg by mouth 3 (three) times daily as needed for moderate pain.   0  . XARELTO 20 MG TABS tablet Take 20 mg by mouth daily with supper.     . metoprolol tartrate (LOPRESSOR) 25 MG tablet Take 0.5 tablets (12.5 mg total) by mouth 2 (two) times  daily. 60 tablet 3   No current facility-administered medications on file prior to visit.     PAST MEDICAL HISTORY: Past Medical History:  Diagnosis Date  . Atrial fibrillation (HCC)   . Chronic lower back pain   . Depression   . DVT (deep venous thrombosis) (HCC) 2060s   LLE  . Fallen arches   . Hypertension   . Hyperthyroidism ~ 2060   "fine now" (04/24/2059)  . Joint pain   . Lactose intolerance   . Leg edema   . Obesity   . Pneumonia 2060s X 1  . Snoring    has not had sleep study but suspects sleep apnea    PAST SURGICAL HISTORY: Past  Surgical History:  Procedure Laterality Date  . CARDIOVERSION N/A 06/19/2059   Procedure: CARDIOVERSION;  Surgeon: Laurey Morale, MD;  Location: [facility] ENDOSCOPY;  Service: Cardiovascular;  Laterality: N/A;  . CARDIOVERSION N/A 07/12/2059   Procedure: CARDIOVERSION;  Surgeon: Lewayne Bunting, MD;  Location: [facility] ENDOSCOPY;  Service: Cardiovascular;  Laterality: N/A;  . CARDIOVERSION N/A 07/03/2059   Procedure: CARDIOVERSION;  Surgeon: Thurmon Fair, MD;  Location: [facility] ENDOSCOPY;  Service: Cardiovascular;  Laterality: N/A;  . CARDIOVERSION N/A 09/30/2059   Procedure: CARDIOVERSION;  Surgeon: Laurey Morale, MD;  Location: [facility] ENDOSCOPY;  Service: Cardiovascular;  Laterality: N/A;  . ENDOMETRIAL ABLATION  ~2006  . TONSILLECTOMY  2060s  . TUBAL LIGATION  2060    SOCIAL HISTORY: Social History   Tobacco Use  . Smoking status: Former Smoker    Packs/day: 0.75    Years: 10.00    Pack years: 7.50    Types: Cigarettes  . Smokeless tobacco: Never Used  . Tobacco comment: 04/24/2059 "stopped smoking cigarettes in ~ 1989"  Substance Use Topics  . Alcohol use: Yes    Alcohol/week: 2.4 oz    Types: 4 Glasses of wine per week    Comment: recently quit  . Drug use: Yes    Types: Marijuana    Comment: 04/24/2059 "recreational marijuana in my late teens/early 20's"    FAMILY HISTORY: Family History  Problem Relation Age of Onset  . Diabetes Mellitus I Father   . CVA Father   . Arthritis Mother   . Obesity Mother   . Thyroid disease Mother   . Liver disease Mother   . CAD Maternal Grandfather     ROS: Review of Systems  Constitutional: Positive for weight loss.  Cardiovascular: Negative for chest pain.  Gastrointestinal: Negative for nausea and vomiting.  Musculoskeletal:       Negative for muscle weakness  Psychiatric/Behavioral: Positive for depression. Negative for suicidal ideas.       Positive for irritability    PHYSICAL EXAM: Blood pressure 107/75, pulse 71, temperature 98.1  F (36.7 C), temperature source Oral, height  (1.6 m), weight 280 lb (127 kg), SpO2 96 %. Body mass index is 49.6 kg/m. Physical Exam  Constitutional: She is oriented to person, place, and time. She appears well-developed and well-nourished.  Cardiovascular: Normal rate.  Pulmonary/Chest: Effort normal.  Musculoskeletal: Normal range of motion.  Neurological: She is oriented to person, place, and time.  Skin: Skin is warm and dry.  Vitals reviewed.   RECENT LABS AND TESTS: BMET    Component Value Date/Time   NA 143 02/08/2018 1011   K 4.4 02/08/2018 1011   CL 104 02/08/2018 1011   CO2 23 02/08/2018 1011   GLUCOSE 93 02/08/2018 1011   GLUCOSE 80 09/24/2017 0845  BUN 15 02/08/2018 1011   CREATININE 0.87 02/08/2018 1011   CREATININE 0.66 06/13/2016 1432   CALCIUM 9.2 02/08/2018 1011   GFRNONAA 73 02/08/2018 1011   GFRAA 84 02/08/2018 1011   Lab Results  Component Value Date   HGBA1C 5.7 (H) 02/08/2018   Lab Results  Component Value Date   INSULIN 5.8 02/08/2018   CBC    Component Value Date/Time   WBC 5.0 02/08/2018 1011   WBC 5.6 09/24/2017 0845   RBC 5.22 02/08/2018 1011   RBC 5.38 (H) 09/24/2017 0845   HGB 14.4 02/08/2018 1011   HCT 41.8 02/08/2018 1011   PLT 254 02/08/2018 1011   MCV 80 02/08/2018 1011   MCH 27.6 02/08/2018 1011   MCH 27.0 09/24/2017 0845   MCHC 34.4 02/08/2018 1011   MCHC 32.7 09/24/2017 0845   RDW 14.2 02/08/2018 1011   LYMPHSABS 1.3 02/08/2018 1011   MONOABS 528 06/13/2016 1432   EOSABS 0.2 02/08/2018 1011   BASOSABS 0.0 02/08/2018 1011   Iron/TIBC/Ferritin/ %Sat No results found for: IRON, TIBC, FERRITIN, IRONPCTSAT Lipid Panel     Component Value Date/Time   CHOL 166 02/08/2018 1011   TRIG 75 02/08/2018 1011   HDL 75 02/08/2018 1011   CHOLHDL 2.2 02/08/2018 1011   LDLCALC 76 02/08/2018 1011   Hepatic Function Panel     Component Value Date/Time   PROT 6.7 02/08/2018 1011   ALBUMIN 4.1 02/08/2018 1011   AST 18  02/08/2018 1011   ALT 12 02/08/2018 1011   ALKPHOS 63 02/08/2018 1011   BILITOT 0.5 02/08/2018 1011      Component Value Date/Time   TSH 4.800 (H) 02/08/2018 1011   TSH 6.839 (H) 10/12/2017 0931   TSH 4.949 (H) 06/26/2017 0905   TSH 3.873 04/22/2016 1721   Results for KAYCIE, PEGUES (MRN [id number]) as of 03/15/2018 14:32  Ref. Range 02/08/2018 10:11  Vitamin D, 25-Hydroxy Latest Ref Range: 30.0 - 100.0 ng/mL 14.6 (L)   ASSESSMENT AND PLAN: Vitamin D deficiency - Plan: Vitamin D, Ergocalciferol, (DRISDOL) 50000 units CAPS capsule  Other depression - with emotional eating - Plan: buPROPion (WELLBUTRIN SR) 150 MG 12 hr tablet  At risk for heart disease  Class 3 severe obesity with serious comorbidity and body mass index (BMI) of 45.0 to 49.9 in adult, unspecified obesity type (HCC)  PLAN:  Vitamin D Deficiency Susen was informed that low vitamin D levels contributes to fatigue and are associated with obesity, breast, and colon cancer. She agrees to continue to take prescription Vit D ,000 IU every week #4 with no refills and will follow up for routine testing of vitamin D, at least 2-3 times per year. She was informed of the risk of over-replacement of vitamin D and agrees to not increase her dose unless she discusses this with [place] first. Donnita agrees to follow up as directed.  Cardiovascular risk counseling Dalani was given extended (15 minutes) coronary artery disease prevention counseling today. She is 62 y.o. female and has risk factors for heart disease including obesity. We discussed intensive lifestyle modifications today with an emphasis on specific weight loss instructions and strategies. Pt was also informed of the importance of increasing exercise and decreasing saturated fats to help prevent heart disease.  Depression with Emotional Eating Behaviors We discussed behavior modification techniques today to help Keshia deal with her emotional eating and depression. She  has agreed to start Bupropion SR 150 mg qAM #30 with no refills and continue Cymbalta as  prescribed and follow up as directed.  Obesity Elantra is currently in the action stage of change. As such, her goal is to continue with weight loss efforts She has agreed to follow the Category 2 plan Glennie has been instructed to work up to a goal of 150 minutes of combined cardio and strengthening exercise per week for weight loss and overall health benefits. We discussed the following Behavioral Modification Strategies today: increasing lean protein intake, decreasing simple carbohydrates , work on meal planning and easy cooking plans and celebration eating strategies   Sydney has agreed to follow up with our clinic in 2 to 3 weeks. She was informed of the importance of frequent follow up visits to maximize her success with intensive lifestyle modifications for her multiple health conditions.   OBESITY BEHAVIORAL INTERVENTION VISIT  Today's visit was # 3 out of 22.  Starting weight: 296 lbs Starting date: 02/08/18 Today's weight : 280 lbs Today's date: 03/15/2018 Total lbs lost to date: 16 (Patients must lose 7 lbs in the first 6 months to continue with counseling)   ASK: We discussed the diagnosis of obesity with Dale Llano today and Kerston agreed to give [place] permission to discuss obesity behavioral modification therapy today.  ASSESS: Quincy has the diagnosis of obesity and her BMI today is 49.61 Shakita is in the action stage of change   ADVISE: Wilena was educated on the multiple health risks of obesity as well as the benefit of weight loss to improve her health. She was advised of the need for long term treatment and the importance of lifestyle modifications.  AGREE: Multiple dietary modification options and treatment options were discussed and  Kewana agreed to the above obesity treatment plan.  I, [place] Crane, am acting as transcriptionist for Quillian Quince, MD  I have  reviewed the above documentation for accuracy and completeness, and I agree with the above. -Quillian Quince, MD

## 2018-03-24 ENCOUNTER — Other Ambulatory Visit (HOSPITAL_COMMUNITY): Payer: Self-pay | Admitting: Nurse Practitioner

## 2018-04-01 ENCOUNTER — Telehealth (HOSPITAL_COMMUNITY): Payer: Self-pay | Admitting: Nurse Practitioner

## 2018-04-01 NOTE — Telephone Encounter (Signed)
Pt called into clinic stating  [facility].Needs information re afib and how to stop anticoagulation for surgery. I spoke  to PharmD re this. She has a chadsvasc score of 4 and with creatinine over 30(pt has crcl cal at 137) she will only need to hold x one day. Bridging is not indicated. If spinal anesthesia is planned, she will have to be off drug x 2 days.

## 2018-04-05 ENCOUNTER — Ambulatory Visit (INDEPENDENT_AMBULATORY_CARE_PROVIDER_SITE_OTHER): Payer: 59 | Admitting: Family Medicine

## 2018-04-05 VITALS — BP 116/76 | HR 67 | Temp 98.1°F | Ht 63.0 in | Wt 274.0 lb

## 2018-04-05 DIAGNOSIS — F418 Other specified anxiety disorders: Secondary | ICD-10-CM

## 2018-04-05 DIAGNOSIS — Z6841 Body Mass Index (BMI) 40.0 and over, adult: Secondary | ICD-10-CM

## 2018-04-05 DIAGNOSIS — E559 Vitamin D deficiency, unspecified: Secondary | ICD-10-CM | POA: Diagnosis not present

## 2018-04-05 DIAGNOSIS — Z9189 Other specified personal risk factors, not elsewhere classified: Secondary | ICD-10-CM

## 2018-04-05 MED ORDER — VITAMIN D (ERGOCALCIFEROL) 1.25 MG (50000 UNIT) PO CAPS: 50000.0000 [IU] | ORAL_CAPSULE | ORAL | 0 refills | Status: DC

## 2018-04-05 MED ORDER — BUPROPION HCL ER (SR) 150 MG PO TB12: 150.0000 mg | ORAL_TABLET | Freq: Every day | ORAL | 0 refills | Status: DC

## 2018-04-05 NOTE — Progress Notes (Signed)
Office: [phone]  /  Fax: [phone]   HPI:   Chief Complaint: OBESITY [name] is here to discuss her progress with her obesity treatment plan. She is on the Category 2 plan and is following her eating plan approximately 91 % of the time. She states she is exercising 0 minutes 0 times per week. [name] continues to do well with weight loss on the category 2 plan, even with some increased celebration eating. She is getting bored with dinner and would like additional options. She is going to have foot surgery in approximately 2 weeks and is worried about weight gain during that time. Her weight is 274 lb (124.3 kg) today and has had a weight loss of 6 pounds over a period of 3 weeks since her last visit. She has lost 22 lbs since starting treatment with [place].  Vitamin D deficiency [name] has a diagnosis of vitamin D deficiency. [name] is stable on vit D. Fatigue is improving and she denies nausea, vomiting or muscle weakness.  At risk for osteopenia and osteoporosis [name] is at higher risk of osteopenia and osteoporosis due to vitamin D deficiency.   Depression with Anxiety [name] started Wellbutrin and noted feeling some irritability the first few days, but this improved. [name] struggles with emotional eating and using food for comfort to the extent that it is negatively impacting her health. She often snacks when she is not hungry. [name] sometimes feels she is out of control and then feels guilty that she made poor food choices. She has been working on behavior modification techniques to help reduce her emotional eating and has been somewhat successful. She shows no sign of suicidal or homicidal ideations.  Depression screen PHQ 2/9 02/08/2018  Decreased Interest 3  Down, Depressed, Hopeless 3  PHQ - 2 Score 6  Altered sleeping 1  Tired, decreased energy 3  Change in appetite 2  Feeling bad or failure about yourself  3  Trouble concentrating 2  Moving slowly or fidgety/restless 3    Suicidal thoughts 0  PHQ-9 Score 20  Difficult doing work/chores Very difficult      ALLERGIES: Allergies  Allergen Reactions  . Penicillins Hives, Shortness Of Breath and Other (See Comments)    Has patient had a PCN reaction causing immediate rash, facial/tongue/throat swelling, SOB or lightheadedness with hypotension: Yes Has patient had a PCN reaction causing severe rash involving mucus membranes or skin necrosis: No Has patient had a PCN reaction that required hospitalization: No Has patient had a PCN reaction occurring within the last 10 years: No If all of the above answers are "NO", then may proceed with Cephalosporin use.   . Codeine Hives    MEDICATIONS: Current Outpatient Medications on File Prior to Visit  Medication Sig Dispense Refill  . amLODipine (NORVASC) 5 MG tablet Take 5 mg by mouth daily.   0  . clonazePAM (KLONOPIN) 0.5 MG tablet Take 0.5 mg by mouth 3 (three) times daily as needed for anxiety.   0  . DULoxetine (CYMBALTA) 30 MG capsule Take 90 mg by mouth daily.    Marland Kitchen losartan-hydrochlorothiazide (HYZAAR) 100-25 MG tablet Take 1 tablet by mouth daily.    . metoprolol tartrate (LOPRESSOR) 25 MG tablet TAKE 1/2 TABLET BY MOUTH TWICE A DAY 60 tablet 0  . pantoprazole (PROTONIX) 40 MG tablet Take 1 tablet (40 mg total) by mouth daily. 30 tablet 1  . traMADol (ULTRAM) 50 MG tablet Take 50 mg by mouth 3 (three) times daily as needed  for moderate pain.   0  . XARELTO 20 MG TABS tablet Take 20 mg by mouth daily with supper.      No current facility-administered medications on file prior to visit.     PAST MEDICAL HISTORY: Past Medical History:  Diagnosis Date  . Atrial fibrillation (HCC)   . Chronic lower back pain   . Depression   . DVT (deep venous thrombosis) (HCC) 1990s   LLE  . Fallen arches   . Hypertension   . Hyperthyroidism ~ 2000   "fine now" (04/23/2016)  . Joint pain   . Lactose intolerance   . Leg edema   . Obesity   . Pneumonia 1980s X 1   . Snoring    has not had sleep study but suspects sleep apnea    PAST SURGICAL HISTORY: Past Surgical History:  Procedure Laterality Date  . CARDIOVERSION N/A 06/18/2016   Procedure: CARDIOVERSION;  Surgeon: Laurey Moralealton S McLean, MD;  [facility];  Service: Cardiovascular;  Laterality: N/A;  . CARDIOVERSION N/A 07/11/2016   Procedure: CARDIOVERSION;  Surgeon: Lewayne BuntingBrian S Crenshaw, MD;  [facility];  Service: Cardiovascular;  Laterality: N/A;  . CARDIOVERSION N/A 07/02/2017   Procedure: CARDIOVERSION;  Surgeon: Thurmon Fairroitoru, Mihai, MD;  Location: [facility] ENDOSCOPY;  Service: Cardiovascular;  Laterality: N/A;  . CARDIOVERSION N/A 09/29/2017   Procedure: CARDIOVERSION;  Surgeon: Laurey MoraleMcLean, Dalton S, MD;  [facility];  Service: Cardiovascular;  Laterality: N/A;  . ENDOMETRIAL ABLATION  ~2006  . TONSILLECTOMY  1960s  . TUBAL LIGATION  1979    SOCIAL HISTORY: Social History   Tobacco Use  . Smoking status: Former Smoker    Packs/day: 0.75    Years: 10.00    Pack years: 7.50    Types: Cigarettes  . Smokeless tobacco: Never Used  . Tobacco comment: 04/23/2016 "stopped smoking cigarettes in ~ 1989"  Substance Use Topics  . Alcohol use: Yes    Alcohol/week: 2.4 oz    Types: 4 Glasses of wine per week    Comment: recently quit  . Drug use: Yes    Types: Marijuana    Comment: 04/23/2016 "recreational marijuana in my late teens/early 20's"    FAMILY HISTORY: Family History  Problem Relation Age of Onset  . Diabetes Mellitus I Father   . CVA Father   . Arthritis Mother   . Obesity Mother   . Thyroid disease Mother   . Liver disease Mother   . CAD Maternal Grandfather     ROS: Review of Systems  Constitutional: Positive for malaise/fatigue and weight loss.  Gastrointestinal: Negative for nausea and vomiting.  Musculoskeletal:       Negative for muscle weakness  Psychiatric/Behavioral: Positive for depression. Negative for suicidal ideas. The patient is  nervous/anxious (anxiety).        Positive for irritability    PHYSICAL EXAM: Blood pressure 116/76, pulse 67, temperature 98.1 F (36.7 C), temperature source Oral, height 5\' 3"  (1.6 m), weight 274 lb (124.3 kg), SpO2 98 %. Body mass index is 48.54 kg/m. Physical Exam  Constitutional: She is oriented to person, place, and time. She appears well-developed and well-nourished.  Cardiovascular: Normal rate.  Pulmonary/Chest: Effort normal.  Musculoskeletal: Normal range of motion.  Neurological: She is oriented to person, place, and time.  Skin: Skin is warm and dry.  Vitals reviewed.   RECENT LABS AND TESTS: BMET    Component Value Date/Time   NA 143 02/08/2018 1011   K 4.4 02/08/2018 1011  CL 104 02/08/2018 1011   CO2 23 02/08/2018 1011   GLUCOSE 93 02/08/2018 1011   GLUCOSE 80 09/24/2017 0845   BUN 15 02/08/2018 1011   CREATININE 0.87 02/08/2018 1011   CREATININE 0.66 06/13/2016 1432   CALCIUM 9.2 02/08/2018 1011   GFRNONAA 73 02/08/2018 1011   GFRAA 84 02/08/2018 1011   Lab Results  Component Value Date   HGBA1C 5.7 (H) 02/08/2018   Lab Results  Component Value Date   INSULIN 5.8 02/08/2018   CBC    Component Value Date/Time   WBC 5.0 02/08/2018 1011   WBC 5.6 09/24/2017 0845   RBC 5.22 02/08/2018 1011   RBC 5.38 (H) 09/24/2017 0845   HGB 14.4 02/08/2018 1011   HCT 41.8 02/08/2018 1011   PLT 254 02/08/2018 1011   MCV 80 02/08/2018 1011   MCH 27.6 02/08/2018 1011   MCH 27.0 09/24/2017 0845   MCHC 34.4 02/08/2018 1011   MCHC 32.7 09/24/2017 0845   RDW 14.2 02/08/2018 1011   LYMPHSABS 1.3 02/08/2018 1011   MONOABS 528 06/13/2016 1432   EOSABS 0.2 02/08/2018 1011   BASOSABS 0.0 02/08/2018 1011   Iron/TIBC/Ferritin/ %Sat No results found for: IRON, TIBC, FERRITIN, IRONPCTSAT Lipid Panel     Component Value Date/Time   CHOL 166 02/08/2018 1011   TRIG 75 02/08/2018 1011   HDL 75 02/08/2018 1011   CHOLHDL 2.2 02/08/2018 1011   LDLCALC 76 02/08/2018  1011   Hepatic Function Panel     Component Value Date/Time   PROT 6.7 02/08/2018 1011   ALBUMIN 4.1 02/08/2018 1011   AST 18 02/08/2018 1011   ALT 12 02/08/2018 1011   ALKPHOS 63 02/08/2018 1011   BILITOT 0.5 02/08/2018 1011      Component Value Date/Time   TSH 4.800 (H) 02/08/2018 1011   TSH 6.839 (H) 10/12/2017 0931   TSH 4.949 (H) 06/26/2017 0905   TSH 3.873 04/22/2016 1721   Results for RAYANNE, PADMANABHAN (MRN [id number]) as of 04/05/2018 13:36  Ref. Range 02/08/2018 10:11  Vitamin D, 25-Hydroxy Latest Ref Range: 30.0 - 100.0 ng/mL 14.6 (L)   ASSESSMENT AND PLAN: Vitamin D deficiency - Plan: Vitamin D, Ergocalciferol, (DRISDOL) 50000 units CAPS capsule  Depression with anxiety - Plan: buPROPion (WELLBUTRIN SR) 150 MG 12 hr tablet  At risk for osteoporosis  Class 3 severe obesity with serious comorbidity and body mass index (BMI) of 45.0 to 49.9 in adult, unspecified obesity type (HCC)  PLAN:  Vitamin D Deficiency Natali was informed that low vitamin D levels contributes to fatigue and are associated with obesity, breast, and colon cancer. She agrees to continue to take prescription Vit D @50 ,000 IU every week #4 with no refills and will follow up for routine testing of vitamin D, at least 2-3 times per year. She was informed of the risk of over-replacement of vitamin D and agrees to not increase her dose unless she discusses this with [place] first. Darrien agrees to follow up with our clinic in 2 weeks.  At risk for osteopenia and osteoporosis [name] is at risk for osteopenia and osteoporosis due to her vitamin D deficiency. She was encouraged to take her vitamin D and follow her higher calcium diet and increase strengthening exercise to help strengthen her bones and decrease her risk of osteopenia and osteoporosis.  Depression with Anxiety We discussed behavior modification techniques today to help [name] deal with her emotional eating and depression. She has agreed to continue  Cymbalta as prescribed and  continue Wellbutrin SR 150 mg qd #30 with no refills and follow up as directed.  Obesity [name] is currently in the action stage of change. As such, her goal is to continue with weight loss efforts She has agreed to keep a food journal with 350 to 500 calories and 35 grams of protein at supper daily and follow the Category 2 plan [name] has been instructed to work up to a goal of 150 minutes of combined cardio and strengthening exercise per week for weight loss and overall health benefits. We discussed the following Behavioral Modification Strategies today: increasing lean protein intake, decreasing simple carbohydrates , work on meal planning and easy cooking plans and emotional eating strategies  [name] has agreed to follow up with our clinic in 2 weeks. She was informed of the importance of frequent follow up visits to maximize her success with intensive lifestyle modifications for her multiple health conditions.   OBESITY BEHAVIORAL INTERVENTION VISIT  Today's visit was # 4 out of 22.  Starting weight: 296 lbs Starting date: 02/08/18 Today's weight : 274 lbs Today's date: 04/05/2018 Total lbs lost to date: 22 (Patients must lose 7 lbs in the first 6 months to continue with counseling)   ASK: We discussed the diagnosis of obesity with [name] today and [name] agreed to give [place] permission to discuss obesity behavioral modification therapy today.  ASSESS: [name] has the diagnosis of obesity and her BMI today is 48.55 [name] is in the action stage of change   ADVISE: [name] was educated on the multiple health risks of obesity as well as the benefit of weight loss to improve her health. She was advised of the need for long term treatment and the importance of lifestyle modifications.  AGREE: Multiple dietary modification options and treatment options were discussed and  [name] agreed to the above obesity treatment plan.  I, [place] Crane, am  acting as transcriptionist for Quillian Quince, MD  I have reviewed the above documentation for accuracy and completeness, and I agree with the above. -Quillian Quince, MD

## 2018-04-09 ENCOUNTER — Other Ambulatory Visit (INDEPENDENT_AMBULATORY_CARE_PROVIDER_SITE_OTHER): Payer: Self-pay | Admitting: Family Medicine

## 2018-04-09 DIAGNOSIS — E559 Vitamin D deficiency, unspecified: Secondary | ICD-10-CM

## 2018-04-19 ENCOUNTER — Ambulatory Visit (INDEPENDENT_AMBULATORY_CARE_PROVIDER_SITE_OTHER): Payer: 59 | Admitting: Physician Assistant

## 2018-04-19 VITALS — BP 108/74 | HR 65 | Temp 97.7°F | Ht 63.0 in | Wt 272.0 lb

## 2018-04-19 DIAGNOSIS — Z6841 Body Mass Index (BMI) 40.0 and over, adult: Secondary | ICD-10-CM | POA: Diagnosis not present

## 2018-04-19 DIAGNOSIS — I10 Essential (primary) hypertension: Secondary | ICD-10-CM

## 2018-04-19 DIAGNOSIS — Z9189 Other specified personal risk factors, not elsewhere classified: Secondary | ICD-10-CM | POA: Diagnosis not present

## 2018-04-19 DIAGNOSIS — E559 Vitamin D deficiency, unspecified: Secondary | ICD-10-CM | POA: Diagnosis not present

## 2018-04-19 MED ORDER — VITAMIN D (ERGOCALCIFEROL) 1.25 MG (50000 UNIT) PO CAPS
50000.0000 [IU] | ORAL_CAPSULE | ORAL | 0 refills | Status: DC
Start: 2018-04-19 — End: 2018-05-24

## 2018-04-19 NOTE — Progress Notes (Signed)
Office: [phone]  /  Fax: [phone]   HPI:   Chief Complaint: OBESITY [name] is here to discuss her progress with her obesity treatment plan. She is on the keep a food journal with 350-500 calories and 35 grams of protein at supper daily and follow the Category 2 plan and is following her eating plan approximately 90 % of the time. She states she is exercising 0 minutes 0 times per week. Kenadee continues to do well with weight loss. She skips lunch on most occasions. She has foot surgery scheduled and would like eating strategies post operation.  Her weight is 272 lb (123.4 kg) today and has had a weight loss of 2 pounds over a period of 2 weeks since her last visit. She has lost 24 lbs since starting treatment with [place].  Vitamin D Deficiency [name] has a diagnosis of vitamin D deficiency. She is currently taking prescription Vit D and denies nausea, vomiting or muscle weakness.  At risk for osteopenia and osteoporosis [name] is at higher risk of osteopenia and osteoporosis due to vitamin D deficiency.   Hypertension [name] is a 62 y.o. female with pre-hypertension. [name]'s blood pressure is stable. She is advised to check blood pressure at home and bring log for review. She is advised if systolic <100 or diastolic <70, to call or send message to our clinic or her primary care physician. She denies chest pain or shortness of breath. She is working weight loss to help control her blood pressure with the goal of decreasing her risk of heart attack and stroke. [name]'s blood pressure is currently controlled.  ALLERGIES: Allergies  Allergen Reactions  . Penicillins Hives, Shortness Of Breath and Other (See Comments)    Has patient had a PCN reaction causing immediate rash, facial/tongue/throat swelling, SOB or lightheadedness with hypotension: Yes Has patient had a PCN reaction causing severe rash involving mucus membranes or skin necrosis: No Has patient had a PCN reaction  that required hospitalization: No Has patient had a PCN reaction occurring within the last 10 years: No If all of the above answers are "NO", then Abigail proceed with Cephalosporin use.   . Codeine Hives    MEDICATIONS: Current Outpatient Medications on File Prior to Visit  Medication Sig Dispense Refill  . amLODipine (NORVASC) 5 MG tablet Take 5 mg by mouth daily.   0  . buPROPion (WELLBUTRIN SR) 150 MG 12 hr tablet Take 1 tablet (150 mg total) by mouth daily. 30 tablet 0  . clonazePAM (KLONOPIN) 0.5 MG tablet Take 0.5 mg by mouth 3 (three) times daily as needed for anxiety.   0  . DULoxetine (CYMBALTA) 30 MG capsule Take 90 mg by mouth daily.    Marland Kitchen losartan-hydrochlorothiazide (HYZAAR) 100-25 MG tablet Take 1 tablet by mouth daily.    . metoprolol tartrate (LOPRESSOR) 25 MG tablet TAKE 1/2 TABLET BY MOUTH TWICE A DAY 60 tablet 0  . pantoprazole (PROTONIX) 40 MG tablet Take 1 tablet (40 mg total) by mouth daily. 30 tablet 1  . traMADol (ULTRAM) 50 MG tablet Take 50 mg by mouth 3 (three) times daily as needed for moderate pain.   0  . XARELTO 20 MG TABS tablet Take 20 mg by mouth daily with supper.      No current facility-administered medications on file prior to visit.     PAST MEDICAL HISTORY: Past Medical History:  Diagnosis Date  . Atrial fibrillation (HCC)   . Chronic lower back pain   .  Depression   . DVT (deep venous thrombosis) (HCC) 1990s   LLE  . Fallen arches   . Hypertension   . Hyperthyroidism ~ 2000   "fine now" (04/23/2016)  . Joint pain   . Lactose intolerance   . Leg edema   . Obesity   . Pneumonia 1980s X 1  . Snoring    has not had sleep study but suspects sleep apnea    PAST SURGICAL HISTORY: Past Surgical History:  Procedure Laterality Date  . CARDIOVERSION N/A 06/18/2016   Procedure: CARDIOVERSION;  Surgeon: Laurey Morale, MD;  [facility] ENDOSCOPY;  Service: Cardiovascular;  Laterality: N/A;  . CARDIOVERSION N/A 07/11/2016   Procedure:  CARDIOVERSION;  Surgeon: Lewayne Bunting, MD;  Location: [facility] ENDOSCOPY;  Service: Cardiovascular;  Laterality: N/A;  . CARDIOVERSION N/A 07/02/2017   Procedure: CARDIOVERSION;  Surgeon: Thurmon Fair, MD;  Location: [facility] ENDOSCOPY;  Service: Cardiovascular;  Laterality: N/A;  . CARDIOVERSION N/A 09/29/2017   Procedure: CARDIOVERSION;  Surgeon: Laurey Morale, MD;  Location: [facility] ENDOSCOPY;  Service: Cardiovascular;  Laterality: N/A;  . ENDOMETRIAL ABLATION  ~2006  . TONSILLECTOMY  1960s  . TUBAL LIGATION  1979    SOCIAL HISTORY: Social History   Tobacco Use  . Smoking status: Former Smoker    Packs/day: 0.75    Years: 10.00    Pack years: 7.50    Types: Cigarettes  . Smokeless tobacco: Never Used  . Tobacco comment: 04/23/2016 "stopped smoking cigarettes in ~ 1989"  Substance Use Topics  . Alcohol use: Yes    Alcohol/week: 2.4 oz    Types: 4 Glasses of wine per week    Comment: recently quit  . Drug use: Yes    Types: Marijuana    Comment: 04/23/2016 "recreational marijuana in my late teens/early 20's"    FAMILY HISTORY: Family History  Problem Relation Age of Onset  . Diabetes Mellitus I Father   . CVA Father   . Arthritis Mother   . Obesity Mother   . Thyroid disease Mother   . Liver disease Mother   . CAD Maternal Grandfather     ROS: Review of Systems  Constitutional: Positive for weight loss.  Respiratory: Negative for shortness of breath.   Cardiovascular: Negative for chest pain.  Gastrointestinal: Negative for nausea and vomiting.  Musculoskeletal:       Negative muscle weakness    PHYSICAL EXAM: Blood pressure 108/74, pulse 65, temperature 97.7 F (36.5 C), temperature source Oral, height 5\' 3"  (1.6 m), weight 272 lb (123.4 kg), SpO2 96 %. Body mass index is 48.18 kg/m. Physical Exam  Constitutional: She is oriented to person, place, and time. She appears well-developed and well-nourished.  Cardiovascular: Normal rate.  Pulmonary/Chest: Effort normal.    Musculoskeletal: Normal range of motion.  Neurological: She is oriented to person, place, and time.  Skin: Skin is warm and dry.  Psychiatric: She has a normal mood and affect. Her behavior is normal.  Vitals reviewed.   RECENT LABS AND TESTS: BMET    Component Value Date/Time   NA 143 02/08/2018 1011   K 4.4 02/08/2018 1011   CL 104 02/08/2018 1011   CO2 23 02/08/2018 1011   GLUCOSE 93 02/08/2018 1011   GLUCOSE 80 09/24/2017 0845   BUN 15 02/08/2018 1011   CREATININE 0.87 02/08/2018 1011   CREATININE 0.66 06/13/2016 1432   CALCIUM 9.2 02/08/2018 1011   GFRNONAA 73 02/08/2018 1011   GFRAA 84 02/08/2018 1011   Lab Results  Component Value Date   HGBA1C 5.7 (H) 02/08/2018   Lab Results  Component Value Date   INSULIN 5.8 02/08/2018   CBC    Component Value Date/Time   WBC 5.0 02/08/2018 1011   WBC 5.6 09/24/2017 0845   RBC 5.22 02/08/2018 1011   RBC 5.38 (H) 09/24/2017 0845   HGB 14.4 02/08/2018 1011   HCT 41.8 02/08/2018 1011   PLT 254 02/08/2018 1011   MCV 80 02/08/2018 1011   MCH 27.6 02/08/2018 1011   MCH 27.0 09/24/2017 0845   MCHC 34.4 02/08/2018 1011   MCHC 32.7 09/24/2017 0845   RDW 14.2 02/08/2018 1011   LYMPHSABS 1.3 02/08/2018 1011   MONOABS 528 06/13/2016 1432   EOSABS 0.2 02/08/2018 1011   BASOSABS 0.0 02/08/2018 1011   Iron/TIBC/Ferritin/ %Sat No results found for: IRON, TIBC, FERRITIN, IRONPCTSAT Lipid Panel     Component Value Date/Time   CHOL 166 02/08/2018 1011   TRIG 75 02/08/2018 1011   HDL 75 02/08/2018 1011   CHOLHDL 2.2 02/08/2018 1011   LDLCALC 76 02/08/2018 1011   Hepatic Function Panel     Component Value Date/Time   PROT 6.7 02/08/2018 1011   ALBUMIN 4.1 02/08/2018 1011   AST 18 02/08/2018 1011   ALT 12 02/08/2018 1011   ALKPHOS 63 02/08/2018 1011   BILITOT 0.5 02/08/2018 1011      Component Value Date/Time   TSH 4.800 (H) 02/08/2018 1011   TSH 6.839 (H) 10/12/2017 0931   TSH 4.949 (H) 06/26/2017 0905   TSH  3.873 04/22/2016 1721  Results for MARIETTA, SIKKEMA (MRN [id number]) as of 04/19/2018 12:37  Ref. Range 02/08/2018 10:11  Vitamin D, 25-Hydroxy Latest Ref Range: 30.0 - 100.0 ng/mL 14.6 (L)    ASSESSMENT AND PLAN: Vitamin D deficiency - Plan: Vitamin D, Ergocalciferol, (DRISDOL) 50000 units CAPS capsule  Essential hypertension  At risk for osteoporosis  Class 3 severe obesity with serious comorbidity and body mass index (BMI) of 45.0 to 49.9 in adult, unspecified obesity type (HCC)  PLAN:  Vitamin D Deficiency [name] was informed that low vitamin D levels contributes to fatigue and are associated with obesity, breast, and colon cancer. [name] agrees to continue taking prescription Vit D @50 ,000 IU every week #4 and we will refill for 1 month. She will follow up for routine testing of vitamin D, at least 2-3 times per year. She was informed of the risk of over-replacement of vitamin D and agrees to not increase her dose unless she discusses this with [place] first. [name] agrees to follow up with our clinic in 3 weeks.  At risk for osteopenia and osteoporosis [name] is at risk for osteopenia and osteoporsis due to her vitamin D deficiency. She was encouraged to take her vitamin D and follow her higher calcium diet and increase strengthening exercise to help strengthen her bones and decrease her risk of osteopenia and osteoporosis.  Hypertension We discussed sodium restriction, working on healthy weight loss, and a regular exercise program as the means to achieve improved blood pressure control. [name] agreed with this plan and agreed to follow up as directed. We will continue to monitor her blood pressure as well as her progress with the above lifestyle modifications. She will continue her medications and will watch for signs of hypotension as she continues her lifestyle modifications. [name] agrees to follow up with our clinic in 3 weeks.  Obesity [name] is currently in the action stage of  change. As such, her goal is  to continue with weight loss efforts She has agreed to portion control better and make smarter food choices, such as increase vegetables and decrease simple carbohydrates  [name] has been instructed to work up to a goal of 150 minutes of combined cardio and strengthening exercise per week for weight loss and overall health benefits. We discussed the following Behavioral Modification Strategies today: no skipping meals and planning for success   [name] has agreed to follow up with our clinic in 3 weeks. She was informed of the importance of frequent follow up visits to maximize her success with intensive lifestyle modifications for her multiple health conditions.   OBESITY BEHAVIORAL INTERVENTION VISIT  Today's visit was # 5 out of 22.  Starting weight: 296 lbs Starting date: 02/08/18 Today's weight : 272 lbs  Today's date: 04/20/2018 Total lbs lost to date: 24 (Patients must lose 7 lbs in the first 6 months to continue with counseling)   ASK: We discussed the diagnosis of obesity with Dale DurhamPamela H Koch today and [name] agreed to give us permission to discuss obesity behavioral modification therapy today.  ASSESS: [name] has the diagnosis of obesity and her BMI today is 48.19 [name] is in the action stage of change   ADVISE: [name] was educated on the multiple health risks of obesity as well as the benefit of weight loss to improve her health. She was advised of the need for long term treatment and the importance of lifestyle modifications.  AGREE: Multiple dietary modification options and treatment options were discussed and  [name] agreed to the above obesity treatment plan.   Trude McburneyI, Sharon Martin, am acting as transcriptionist for Illa LevelSahar Osman, PA-C I, Illa LevelSahar Osman [facility], have reviewed this note and agree with its content

## 2018-05-06 ENCOUNTER — Other Ambulatory Visit (INDEPENDENT_AMBULATORY_CARE_PROVIDER_SITE_OTHER): Payer: Self-pay | Admitting: Family Medicine

## 2018-05-06 ENCOUNTER — Ambulatory Visit (HOSPITAL_COMMUNITY): Payer: 59 | Admitting: Nurse Practitioner

## 2018-05-06 DIAGNOSIS — F418 Other specified anxiety disorders: Secondary | ICD-10-CM

## 2018-05-10 ENCOUNTER — Ambulatory Visit (INDEPENDENT_AMBULATORY_CARE_PROVIDER_SITE_OTHER): Payer: 59 | Admitting: Physician Assistant

## 2018-05-10 ENCOUNTER — Encounter (INDEPENDENT_AMBULATORY_CARE_PROVIDER_SITE_OTHER): Payer: Self-pay

## 2018-05-20 ENCOUNTER — Other Ambulatory Visit (INDEPENDENT_AMBULATORY_CARE_PROVIDER_SITE_OTHER): Payer: Self-pay | Admitting: Physician Assistant

## 2018-05-20 DIAGNOSIS — E559 Vitamin D deficiency, unspecified: Secondary | ICD-10-CM

## 2018-05-22 ENCOUNTER — Other Ambulatory Visit (HOSPITAL_COMMUNITY): Payer: Self-pay | Admitting: Nurse Practitioner

## 2018-05-24 ENCOUNTER — Ambulatory Visit (INDEPENDENT_AMBULATORY_CARE_PROVIDER_SITE_OTHER): Payer: 59 | Admitting: Family Medicine

## 2018-05-24 VITALS — BP 115/82 | HR 73 | Temp 97.8°F | Ht 63.0 in | Wt 272.0 lb

## 2018-05-24 DIAGNOSIS — Z9189 Other specified personal risk factors, not elsewhere classified: Secondary | ICD-10-CM

## 2018-05-24 DIAGNOSIS — G4709 Other insomnia: Secondary | ICD-10-CM | POA: Diagnosis not present

## 2018-05-24 DIAGNOSIS — F3289 Other specified depressive episodes: Secondary | ICD-10-CM | POA: Diagnosis not present

## 2018-05-24 DIAGNOSIS — E559 Vitamin D deficiency, unspecified: Secondary | ICD-10-CM | POA: Diagnosis not present

## 2018-05-24 DIAGNOSIS — Z6841 Body Mass Index (BMI) 40.0 and over, adult: Secondary | ICD-10-CM

## 2018-05-24 MED ORDER — BUPROPION HCL ER (SR) 150 MG PO TB12: ORAL_TABLET | ORAL | 0 refills | Status: DC

## 2018-05-24 MED ORDER — VITAMIN D (ERGOCALCIFEROL) 1.25 MG (50000 UNIT) PO CAPS
50000.0000 [IU] | ORAL_CAPSULE | ORAL | 0 refills | Status: DC
Start: 2018-05-24 — End: 2018-06-21

## 2018-05-25 NOTE — Progress Notes (Signed)
Office: [phone]  /  Fax: [phone]   HPI:   Chief Complaint: OBESITY [name] is here to discuss her progress with her obesity treatment plan. She is on the portion control better and make smarter food choices, such as increase vegetables and decrease simple carbohydrates and is following her eating plan approximately 85-90 % of the time. She states she is on her knee scooter all the time. Rital did well maintaining weight while being more sedentary after foot surgery. She is trying to be mindful but sometimes notes increased boredom snacking.  Her weight is 272 lb (123.4 kg) today and has not lost weight since her last visit. She has lost 24 lbs since starting treatment with [place].  Vitamin D Deficiency [name] has a diagnosis of vitamin D deficiency. She is on Vit D prescription weekly and OTC Vit D 5,000 IU daily. Last level was very low and she is recovering from foot surgery. She denies nausea, vomiting or muscle weakness.  Insomnia [name] notes difficulty falling asleep some nights since decreased activity. She denies a history of sleep insomnia previously.  Depression with emotional eating behaviors [name]'s mood is stable, she notes some boredom eating but this seems situational. She has some insomnia which started after surgery and is unlikely to be related to Wellbutrin. [name] struggles with emotional eating and using food for comfort to the extent that it is negatively impacting her health. She often snacks when she is not hungry. [name] sometimes feels she is out of control and then feels guilty that she made poor food choices. She has been working on behavior modification techniques to help reduce her emotional eating and has been somewhat successful. She shows no sign of suicidal or homicidal ideations.  Depression screen PHQ 2/9 02/08/2018  Decreased Interest 3  Down, Depressed, Hopeless 3  PHQ - 2 Score 6  Altered sleeping 1  Tired, decreased energy 3  Change in  appetite 2  Feeling bad or failure about yourself  3  Trouble concentrating 2  Moving slowly or fidgety/restless 3  Suicidal thoughts 0  PHQ-9 Score 20  Difficult doing work/chores Very difficult   At risk for cardiovascular disease [name] is at a higher than average risk for cardiovascular disease due to obesity. She currently denies any chest pain.  ALLERGIES: Allergies  Allergen Reactions  . Penicillins Hives, Shortness Of Breath and Other (See Comments)    Has patient had a PCN reaction causing immediate rash, facial/tongue/throat swelling, SOB or lightheadedness with hypotension: Yes Has patient had a PCN reaction causing severe rash involving mucus membranes or skin necrosis: No Has patient had a PCN reaction that required hospitalization: No Has patient had a PCN reaction occurring within the last 10 years: No If all of the above answers are "NO", then may proceed with Cephalosporin use.   . Codeine Hives    MEDICATIONS: Current Outpatient Medications on File Prior to Visit  Medication Sig Dispense Refill  . amLODipine (NORVASC) 5 MG tablet Take 2.5 mg by mouth daily.   0  . clonazePAM (KLONOPIN) 0.5 MG tablet Take 0.5 mg by mouth 3 (three) times daily as needed for anxiety.   0  . DULoxetine (CYMBALTA) 30 MG capsule Take 90 mg by mouth daily.    Marland Kitchen losartan-hydrochlorothiazide (HYZAAR) 100-25 MG tablet Take 1 tablet by mouth daily.    . metoprolol tartrate (LOPRESSOR) 25 MG tablet TAKE 1/2 TABLET BY MOUTH TWICE A DAY 60 tablet 0  . pantoprazole (PROTONIX) 40 MG tablet  Take 1 tablet (40 mg total) by mouth daily. 30 tablet 1  . traMADol (ULTRAM) 50 MG tablet Take 50 mg by mouth 3 (three) times daily as needed for moderate pain.   0  . XARELTO 20 MG TABS tablet Take 20 mg by mouth daily with supper.      No current facility-administered medications on file prior to visit.     PAST MEDICAL HISTORY: Past Medical History:  Diagnosis Date  . Atrial fibrillation (HCC)   .  Chronic lower back pain   . Depression   . DVT (deep venous thrombosis) (HCC) 1990s   LLE  . Fallen arches   . Hypertension   . Hyperthyroidism ~ 2000   "fine now" (04/23/2016)  . Joint pain   . Lactose intolerance   . Leg edema   . Obesity   . Pneumonia 1980s X 1  . Snoring    has not had sleep study but suspects sleep apnea    PAST SURGICAL HISTORY: Past Surgical History:  Procedure Laterality Date  . CARDIOVERSION N/A 06/18/2016   Procedure: CARDIOVERSION;  Surgeon: Laurey Morale, MD;  Location: [facility] ENDOSCOPY;  Service: Cardiovascular;  Laterality: N/A;  . CARDIOVERSION N/A 07/11/2016   Procedure: CARDIOVERSION;  Surgeon: Lewayne Bunting, MD;  Location: [facility] ENDOSCOPY;  Service: Cardiovascular;  Laterality: N/A;  . CARDIOVERSION N/A 07/02/2017   Procedure: CARDIOVERSION;  Surgeon: Thurmon Fair, MD;  Location: [facility] ENDOSCOPY;  Service: Cardiovascular;  Laterality: N/A;  . CARDIOVERSION N/A 09/29/2017   Procedure: CARDIOVERSION;  Surgeon: Laurey Morale, MD;  Location: [facility] ENDOSCOPY;  Service: Cardiovascular;  Laterality: N/A;  . ENDOMETRIAL ABLATION  ~2006  . TONSILLECTOMY  1960s  . TUBAL LIGATION  1979    SOCIAL HISTORY: Social History   Tobacco Use  . Smoking status: Former Smoker    Packs/day: 0.75    Years: 10.00    Pack years: 7.50    Types: Cigarettes  . Smokeless tobacco: Never Used  . Tobacco comment: 04/23/2016 "stopped smoking cigarettes in ~ 1989"  Substance Use Topics  . Alcohol use: Yes    Alcohol/week: 2.4 oz    Types: 4 Glasses of wine per week    Comment: recently quit  . Drug use: Yes    Types: Marijuana    Comment: 04/23/2016 "recreational marijuana in my late teens/early 20's"    FAMILY HISTORY: Family History  Problem Relation Age of Onset  . Diabetes Mellitus I Father   . CVA Father   . Arthritis Mother   . Obesity Mother   . Thyroid disease Mother   . Liver disease Mother   . CAD Maternal Grandfather     ROS: Review of Systems    Constitutional: Negative for weight loss.  Cardiovascular: Negative for chest pain.  Gastrointestinal: Negative for nausea and vomiting.  Musculoskeletal:       Negative muscle weakness  Psychiatric/Behavioral: Positive for depression. Negative for suicidal ideas. The patient has insomnia.     PHYSICAL EXAM: Blood pressure 115/82, pulse 73, temperature 97.8 F (36.6 C), temperature source Oral, height 5\' 3"  (1.6 m), weight 272 lb (123.4 kg), SpO2 96 %. Body mass index is 48.18 kg/m. Physical Exam  Constitutional: She is oriented to person, place, and time. She appears well-developed and well-nourished.  Cardiovascular: Normal rate.  Pulmonary/Chest: Effort normal.  Musculoskeletal: Normal range of motion.  Neurological: She is oriented to person, place, and time.  Skin: Skin is warm and dry.  Psychiatric: She has a normal  mood and affect. Her behavior is normal.  Vitals reviewed.   RECENT LABS AND TESTS: BMET    Component Value Date/Time   NA 143 02/08/2018 1011   K 4.4 02/08/2018 1011   CL 104 02/08/2018 1011   CO2 23 02/08/2018 1011   GLUCOSE 93 02/08/2018 1011   GLUCOSE 80 09/24/2017 0845   BUN 15 02/08/2018 1011   CREATININE 0.87 02/08/2018 1011   CREATININE 0.66 06/13/2016 1432   CALCIUM 9.2 02/08/2018 1011   GFRNONAA 73 02/08/2018 1011   GFRAA 84 02/08/2018 1011   Lab Results  Component Value Date   HGBA1C 5.7 (H) 02/08/2018   Lab Results  Component Value Date   INSULIN 5.8 02/08/2018   CBC    Component Value Date/Time   WBC 5.0 02/08/2018 1011   WBC 5.6 09/24/2017 0845   RBC 5.22 02/08/2018 1011   RBC 5.38 (H) 09/24/2017 0845   HGB 14.4 02/08/2018 1011   HCT 41.8 02/08/2018 1011   PLT 254 02/08/2018 1011   MCV 80 02/08/2018 1011   MCH 27.6 02/08/2018 1011   MCH 27.0 09/24/2017 0845   MCHC 34.4 02/08/2018 1011   MCHC 32.7 09/24/2017 0845   RDW 14.2 02/08/2018 1011   LYMPHSABS 1.3 02/08/2018 1011   MONOABS 528 06/13/2016 1432   EOSABS 0.2  02/08/2018 1011   BASOSABS 0.0 02/08/2018 1011   Iron/TIBC/Ferritin/ %Sat No results found for: IRON, TIBC, FERRITIN, IRONPCTSAT Lipid Panel     Component Value Date/Time   CHOL 166 02/08/2018 1011   TRIG 75 02/08/2018 1011   HDL 75 02/08/2018 1011   CHOLHDL 2.2 02/08/2018 1011   LDLCALC 76 02/08/2018 1011   Hepatic Function Panel     Component Value Date/Time   PROT 6.7 02/08/2018 1011   ALBUMIN 4.1 02/08/2018 1011   AST 18 02/08/2018 1011   ALT 12 02/08/2018 1011   ALKPHOS 63 02/08/2018 1011   BILITOT 0.5 02/08/2018 1011      Component Value Date/Time   TSH 4.800 (H) 02/08/2018 1011   TSH 6.839 (H) 10/12/2017 0931   TSH 4.949 (H) 06/26/2017 0905   TSH 3.873 04/22/2016 1721  Results for Abigail DurhamLOCKLEAR, Cai H (MRN [id number]) as of 05/25/2018 14:48  Ref. Range 02/08/2018 10:11  Vitamin D, 25-Hydroxy Latest Ref Range: 30.0 - 100.0 ng/mL 14.6 (L)    ASSESSMENT AND PLAN: Vitamin D deficiency - Plan: Vitamin D, Ergocalciferol, (DRISDOL) 50000 units CAPS capsule  Other insomnia  Other depression - with emotional eating - Plan: buPROPion (WELLBUTRIN SR) 150 MG 12 hr tablet  At risk for heart disease  Class 3 severe obesity with serious comorbidity and body mass index (BMI) of 45.0 to 49.9 in adult, unspecified obesity type (HCC)  PLAN:  Vitamin D Deficiency [name] was informed that low vitamin D levels contributes to fatigue and are associated with obesity, breast, and colon cancer. [name] agrees to continue taking prescription Vit D @50 ,000 IU every week #4 and we will refill for 1 month. She will follow up for routine testing of vitamin D, at least 2-3 times per year. She was informed of the risk of over-replacement of vitamin D and agrees to not increase her dose unless she discusses this with us first. [name] agrees to follow up with our clinic in 2 to 3 weeks and we will recheck labs at that time.  Insomnia [name] is ok to take OTC benadryl 25 mg 1 hour before bedtime if  needed to help with sleep. Insomnia likely to  improve once she is able to increase activity. [name] agrees to follow up with our clinic in 2 to 3 weeks.  Depression with Emotional Eating Behaviors We discussed behavior modification techniques today to help [name] deal with her emotional eating and depression. [name] agrees to continue taking bupropion SR 150 mg qd #30 and we will refill for 1 month. [name] agrees to follow up with our clinic in 2 to 3 weeks.  Cardiovascular risk counselling [name] was given extended (15 minutes) coronary artery disease prevention counseling today. She is 62 y.o. female and has risk factors for heart disease including obesity. We discussed intensive lifestyle modifications today with an emphasis on specific weight loss instructions and strategies. Pt was also informed of the importance of increasing exercise and decreasing saturated fats to help prevent heart disease.  Obesity [name] is currently in the action stage of change. As such, her goal is to continue with weight loss efforts She has agreed to portion control better and make smarter food choices, such as increase vegetables and decrease simple carbohydrates  [name] has been instructed to work up to a goal of 150 minutes of combined cardio and strengthening exercise per week for weight loss and overall health benefits. Exercise bands and handout given. We discussed the following Behavioral Modification Strategies today: increasing lean protein intake, decreasing simple carbohydrates, increasing vegetables, ways to avoid boredom eating, ways to avoid night time snacking, increase H20 intake, and better snacking choices   [name] has agreed to follow up with our clinic in 2 to 3 weeks. She was informed of the importance of frequent follow up visits to maximize her success with intensive lifestyle modifications for her multiple health conditions.   OBESITY BEHAVIORAL INTERVENTION VISIT  Today's visit was # 6  out of 22.  Starting weight: 296 lbs Starting date: 02/08/18 Today's weight : 272 lbs Today's date: 05/24/2018 Total lbs lost to date: 24    ASK: We discussed the diagnosis of obesity with [name] today and [name] agreed to give [place] permission to discuss obesity behavioral modification therapy today.  ASSESS: [name] has the diagnosis of obesity and her BMI today is 48.19 [name] is in the action stage of change   ADVISE: [name] was educated on the multiple health risks of obesity as well as the benefit of weight loss to improve her health. She was advised of the need for long term treatment and the importance of lifestyle modifications.  AGREE: Multiple dietary modification options and treatment options were discussed and  [name] agreed to the above obesity treatment plan.  I, Burt Knack, am acting as transcriptionist for Quillian Quince, MD  I have reviewed the above documentation for accuracy and completeness, and I agree with the above. -Quillian Quince, MD

## 2018-06-04 ENCOUNTER — Other Ambulatory Visit (INDEPENDENT_AMBULATORY_CARE_PROVIDER_SITE_OTHER): Payer: Self-pay | Admitting: Family Medicine

## 2018-06-04 DIAGNOSIS — F418 Other specified anxiety disorders: Secondary | ICD-10-CM

## 2018-06-14 ENCOUNTER — Ambulatory Visit (INDEPENDENT_AMBULATORY_CARE_PROVIDER_SITE_OTHER): Payer: Self-pay | Admitting: Family Medicine

## 2018-06-21 ENCOUNTER — Other Ambulatory Visit (INDEPENDENT_AMBULATORY_CARE_PROVIDER_SITE_OTHER): Payer: Self-pay | Admitting: Family Medicine

## 2018-06-21 DIAGNOSIS — E559 Vitamin D deficiency, unspecified: Secondary | ICD-10-CM

## 2018-06-30 ENCOUNTER — Ambulatory Visit (INDEPENDENT_AMBULATORY_CARE_PROVIDER_SITE_OTHER): Payer: 59 | Admitting: Family Medicine

## 2018-06-30 VITALS — BP 109/63 | HR 65 | Temp 97.7°F | Ht 63.0 in | Wt 272.0 lb

## 2018-06-30 DIAGNOSIS — I10 Essential (primary) hypertension: Secondary | ICD-10-CM

## 2018-06-30 DIAGNOSIS — F3289 Other specified depressive episodes: Secondary | ICD-10-CM | POA: Diagnosis not present

## 2018-06-30 DIAGNOSIS — R7989 Other specified abnormal findings of blood chemistry: Secondary | ICD-10-CM

## 2018-06-30 DIAGNOSIS — Z9189 Other specified personal risk factors, not elsewhere classified: Secondary | ICD-10-CM

## 2018-06-30 DIAGNOSIS — Z6841 Body Mass Index (BMI) 40.0 and over, adult: Secondary | ICD-10-CM

## 2018-06-30 MED ORDER — BUPROPION HCL ER (SR) 200 MG PO TB12: 200.0000 mg | ORAL_TABLET | Freq: Two times a day (BID) | ORAL | 0 refills | Status: DC

## 2018-06-30 NOTE — Progress Notes (Signed)
Office: [phone]  /  Fax: [phone]   HPI:   Chief Complaint: OBESITY [name] is here to discuss her progress with her obesity treatment plan. She is on the portion control better and make smarter food choices, such as increase vegetables and decrease simple carbohydrates and is following her eating plan approximately 50-60 % of the time. She states she is exercising 0 minutes 0 times per week. Lus is working on maintaining weight while recovering from foot surgery. She is doing well with this goal but she is still struggling with emotional eating, and she is frustrated with her lack of activity.  Her weight is 272 lb (123.4 kg) today and has not lost weight since her last visit. She has lost 24 lbs since starting treatment with [place].  Hypertension [name] is a 62 y.o. female with hypertension. [name]'s blood pressure on medications. She denies chest pain or dizziness. She is working weight loss to help control her blood pressure with the goal of decreasing her risk of heart attack and stroke. [name]'s blood pressure is currently controlled.  At risk for cardiovascular disease [name] is at a higher than average risk for cardiovascular disease due to obesity and hypertension. She currently denies any chest pain.  Elevated TSH [name] has elevated TSH with normal T3 and T4. She denies hot or cold intolerance or palpitations.  Depression with emotional eating behaviors [name] is on Cymbalta and Wellbutrin but feels the Wellbutrin isn't helping much with her mood or emotional eating. [name] struggles with emotional eating and using food for comfort to the extent that it is negatively impacting her health. She often snacks when she is not hungry. [name] sometimes feels she is out of control and then feels guilty that she made poor food choices. She has been working on behavior modification techniques to help reduce her emotional eating and has been somewhat successful. She shows no  sign of suicidal or homicidal ideations.  Depression screen PHQ 2/9 02/08/2018  Decreased Interest 3  Down, Depressed, Hopeless 3  PHQ - 2 Score 6  Altered sleeping 1  Tired, decreased energy 3  Change in appetite 2  Feeling bad or failure about yourself  3  Trouble concentrating 2  Moving slowly or fidgety/restless 3  Suicidal thoughts 0  PHQ-9 Score 20  Difficult doing work/chores Very difficult    ALLERGIES: Allergies  Allergen Reactions  . Penicillins Hives, Shortness Of Breath and Other (See Comments)    Has patient had a PCN reaction causing immediate rash, facial/tongue/throat swelling, SOB or lightheadedness with hypotension: Yes Has patient had a PCN reaction causing severe rash involving mucus membranes or skin necrosis: No Has patient had a PCN reaction that required hospitalization: No Has patient had a PCN reaction occurring within the last 10 years: No If all of the above answers are "NO", then may proceed with Cephalosporin use.   . Codeine Hives    MEDICATIONS: Current Outpatient Medications on File Prior to Visit  Medication Sig Dispense Refill  . amLODipine (NORVASC) 5 MG tablet Take 2.5 mg by mouth daily.   0  . Ascorbic Acid (VITAMIN C) 1000 MG tablet Take 1,000 mg by mouth daily.    . clonazePAM (KLONOPIN) 0.5 MG tablet Take 0.5 mg by mouth 3 (three) times daily as needed for anxiety.   0  . DULoxetine (CYMBALTA) 30 MG capsule Take 90 mg by mouth daily.    Marland Kitchen losartan-hydrochlorothiazide (HYZAAR) 100-25 MG tablet Take 1 tablet by mouth daily.    Marland Kitchen  metoprolol tartrate (LOPRESSOR) 25 MG tablet TAKE 1/2 TABLET BY MOUTH TWICE A DAY 60 tablet 0  . pantoprazole (PROTONIX) 40 MG tablet Take 1 tablet (40 mg total) by mouth daily. 30 tablet 1  . traMADol (ULTRAM) 50 MG tablet Take 50 mg by mouth 3 (three) times daily as needed for moderate pain.   0  . Vitamin D, Ergocalciferol, (DRISDOL) 50000 units CAPS capsule TAKE 1 CAPSULE BY MOUTH EVERY 7 DAYS 4 capsule 0  .  Vitamin D, Ergocalciferol, (DRISDOL) 50000 units CAPS capsule Take 5,000 Units by mouth once.     [name] 20 MG TABS tablet Take 20 mg by mouth daily with supper.      No current facility-administered medications on file prior to visit.     PAST MEDICAL HISTORY: Past Medical History:  Diagnosis Date  . Atrial fibrillation (HCC)   . Chronic lower back pain   . Depression   . DVT (deep venous thrombosis) (HCC) 1990s   LLE  . Fallen arches   . Hypertension   . Hyperthyroidism ~ 2000   "fine now" (04/23/2016)  . Joint pain   . Lactose intolerance   . Leg edema   . Obesity   . Pneumonia 1980s X 1  . Snoring    has not had sleep study but suspects sleep apnea    PAST SURGICAL HISTORY: Past Surgical History:  Procedure Laterality Date  . CARDIOVERSION N/A 06/18/2016   Procedure: CARDIOVERSION;  Surgeon: Laurey Morale, MD;  Location[facility] END[facility]COPY;  Service: Cardiovascular;  Laterality: N/A;  . CARDIOVERSION N/A 07/11/2016   Procedure: CARDIOVERSION;  Surgeon: Lewayne Bunting, MD;  Location: [facility] ENDOSCOPY;  Service: Cardiovascular;  Laterality: N/A;  . CARDIOVERSION N/A 07/02/2017   Procedure: CARDIOVERSION;  Surgeon: Thurmon Fair, MD;  Location: [facility] ENDOSCOPY;  Service: Cardiovascular;  Laterality: N/A;  . CARDIOVERSION N/A 09/29/2017   Procedure: CARDIOVERSION;  Surgeon: Laurey Morale, MD;  Location: [facility] ENDOSCOPY;  Service: Cardiovascular;  Laterality: N/A;  . ENDOMETRIAL ABLATION  ~2006  . TONSILLECTOMY  1960s  . TUBAL LIGATION  1979    SOCIAL HISTORY: Social History   Tobacco Use  . Smoking status: Former Smoker    Packs/day: 0.75    Years: 10.00    Pack years: 7.50    Types: Cigarettes  . Smokeless tobacco: Never Used  . Tobacco comment: 04/23/2016 "stopped smoking cigarettes in ~ 1989"  Substance Use Topics  . Alcohol use: Yes    Alcohol/week: 4.0 standard drinks    Types: 4 Glasses of wine per week    Comment: recently quit  . Drug use: Yes    Types:  Marijuana    Comment: 04/23/2016 "recreational marijuana in my late teens/early 20's"    FAMILY HISTORY: Family History  Problem Relation Age of Onset  . Diabetes Mellitus I Father   . CVA Father   . Arthritis Mother   . Obesity Mother   . Thyroid disease Mother   . Liver disease Mother   . CAD Maternal Grandfather     ROS: Review of Systems  Constitutional: Negative for weight loss.  Cardiovascular: Negative for chest pain and palpitations.  Neurological: Negative for dizziness.  Endo/Heme/Allergies:       Negative hot/cold intolerance  Psychiatric/Behavioral: Positive for depression. Negative for suicidal ideas.    PHYSICAL EXAM: Blood pressure 109/63, pulse 65, temperature 97.7 F (36.5 C), temperature source Oral, height 5\' 3"  (1.6 m), weight 272 lb (123.4 kg), SpO2 97 %. Body  mass index is 48.18 kg/m. Physical Exam  Constitutional: She is oriented to person, place, and time. She appears well-developed and well-nourished.  Cardiovascular: Normal rate.  Pulmonary/Chest: Effort normal.  Musculoskeletal: Normal range of motion.  Neurological: She is oriented to person, place, and time.  Skin: Skin is warm and dry.  Psychiatric: She has a normal mood and affect. Her behavior is normal.  Vitals reviewed.   RECENT LABS AND TESTS: BMET    Component Value Date/Time   NA 143 02/08/2018 1011   K 4.4 02/08/2018 1011   CL 104 02/08/2018 1011   CO2 23 02/08/2018 1011   GLUCOSE 93 02/08/2018 1011   GLUCOSE 80 09/24/2017 0845   BUN 15 02/08/2018 1011   CREATININE 0.87 02/08/2018 1011   CREATININE 0.66 06/13/2016 1432   CALCIUM 9.2 02/08/2018 1011   GFRNONAA 73 02/08/2018 1011   GFRAA 84 02/08/2018 1011   Lab Results  Component Value Date   HGBA1C 5.7 (H) 02/08/2018   Lab Results  Component Value Date   INSULIN 5.8 02/08/2018   CBC    Component Value Date/Time   WBC 5.0 02/08/2018 1011   WBC 5.6 09/24/2017 0845   RBC 5.22 02/08/2018 1011   RBC 5.38 (H)  09/24/2017 0845   HGB 14.4 02/08/2018 1011   HCT 41.8 02/08/2018 1011   PLT 254 02/08/2018 1011   MCV 80 02/08/2018 1011   MCH 27.6 02/08/2018 1011   MCH 27.0 09/24/2017 0845   MCHC 34.4 02/08/2018 1011   MCHC 32.7 09/24/2017 0845   RDW 14.2 02/08/2018 1011   LYMPHSABS 1.3 02/08/2018 1011   MONOABS 528 06/13/2016 1432   EOSABS 0.2 02/08/2018 1011   BASOSABS 0.0 02/08/2018 1011   Iron/TIBC/Ferritin/ %Sat No results found for: IRON, TIBC, FERRITIN, IRONPCTSAT Lipid Panel     Component Value Date/Time   CHOL 166 02/08/2018 1011   TRIG 75 02/08/2018 1011   HDL 75 02/08/2018 1011   CHOLHDL 2.2 02/08/2018 1011   LDLCALC 76 02/08/2018 1011   Hepatic Function Panel     Component Value Date/Time   PROT 6.7 02/08/2018 1011   ALBUMIN 4.1 02/08/2018 1011   AST 18 02/08/2018 1011   ALT 12 02/08/2018 1011   ALKPHOS 63 02/08/2018 1011   BILITOT 0.5 02/08/2018 1011      Component Value Date/Time   TSH 4.800 (H) 02/08/2018 1011   TSH 6.839 (H) 10/12/2017 0931   TSH 4.949 (H) 06/26/2017 0905   TSH 3.873 04/22/2016 1721    ASSESSMENT AND PLAN: Essential hypertension - Plan: Comprehensive metabolic panel, Hemoglobin A1c, Insulin, random, VITAMIN D 25 Hydroxy (Vit-D Deficiency, Fractures)  Elevated TSH - Plan: T3, T4, free, TSH  Other depression - with emotional eating - Plan: buPROPion (WELLBUTRIN SR) 200 MG 12 hr tablet  At risk for heart disease  Class 3 severe obesity with serious comorbidity and body mass index (BMI) of 45.0 to 49.9 in adult, unspecified obesity type (HCC)  PLAN:  Hypertension We discussed sodium restriction, working on healthy weight loss, and a regular exercise program as the means to achieve improved blood pressure control. Merlee agreed with this plan and agreed to follow up as directed. We will continue to monitor her blood pressure as well as her progress with the above lifestyle modifications. Shenise agrees to continue her medications, and diet, and  will watch for signs of hypotension as she continues her lifestyle modifications. Shacoria agrees to follow up with our clinic in 3 weeks.  Cardiovascular risk  counselling Tila was given extended (15 minutes) coronary artery disease prevention counseling today. She is 62 y.o. female and has risk factors for heart disease including obesity and hypertension. We discussed intensive lifestyle modifications today with an emphasis on specific weight loss instructions and strategies. Pt was also informed of the importance of increasing exercise and decreasing saturated fats to help prevent heart disease.  Elevated TSH Eliannah was informed of the importance of good thyroid control to help with weight loss efforts. She was also informed that supertheraputic thyroid levels are dangerous and will not improve weight loss results. We will check labs and Zamariah agrees to follow up with our clinic in 3 weeks.  Depression with Emotional Eating Behaviors We discussed behavior modification techniques today to help Blossom deal with her emotional eating and depression. Wilmarie agrees to increase Wellbutrin SR to 200 mg q AM #30 with no refills, and she will continue Cymbalta. Nakyla agrees to follow up with our clinic in 3 weeks.  Obesity Micky is currently in the action stage of change. As such, her goal is to continue with weight loss efforts She has agreed to change to keep a food journal with 1200-1500 calories and 85+ grams of protein daily Kamariya has been instructed to work up to a goal of 150 minutes of combined cardio and strengthening exercise per week for weight loss and overall health benefits. We discussed the following Behavioral Modification Strategies today: increasing lean protein intake, decreasing simple carbohydrates, and keep a strict food journal    Loreley has agreed to follow up with our clinic in 3 weeks. She was informed of the importance of frequent follow up visits to maximize her success  with intensive lifestyle modifications for her multiple health conditions.   OBESITY BEHAVIORAL INTERVENTION VISIT  Today's visit was # 7   Starting weight: 296 lbs Starting date: 02/08/18 Today's weight : 272 lbs Today's date: 06/30/2018 Total lbs lost to date: 24   ASK: We discussed the diagnosis of obesity with Dale Trinidad today and Kamirah agreed to give [place] permission to discuss obesity behavioral modification therapy today.  ASSESS: Rianna has the diagnosis of obesity and her BMI today is 48.19 Truth is in the action stage of change   ADVISE: Cerria was educated on the multiple health risks of obesity as well as the benefit of weight loss to improve her health. She was advised of the need for long term treatment and the importance of lifestyle modifications to improve her current health and to decrease her risk of future health problems.  AGREE: Multiple dietary modification options and treatment options were discussed and  Ayjah agreed to follow the recommendations documented in the above note.  ARRANGE: Toshia was educated on the importance of frequent visits to treat obesity as outlined per CMS and USPSTF guidelines and agreed to schedule her next follow up appointment today.  I, Burt Knack, am acting as transcriptionist for Quillian Quince, MD  I have reviewed the above documentation for accuracy and completeness, and I agree with the above. -Quillian Quince, MD

## 2018-07-01 LAB — HEMOGLOBIN A1C
Est. average glucose Bld gHb Est-mCnc: 111 mg/dL
Hgb A1c MFr Bld: 5.5 % (ref 4.8–5.6)

## 2018-07-01 LAB — COMPREHENSIVE METABOLIC PANEL
ALT: 19 IU/L (ref 0–32)
AST: 18 IU/L (ref 0–40)
Albumin/Globulin Ratio: 1.6 (ref 1.2–2.2)
Albumin: 4.5 g/dL (ref 3.6–4.8)
Alkaline Phosphatase: 61 IU/L (ref 39–117)
BUN/Creatinine Ratio: 27 (ref 12–28)
BUN: 25 mg/dL (ref 8–27)
Bilirubin Total: 0.5 mg/dL (ref 0.0–1.2)
CO2: 19 mmol/L — ABNORMAL LOW (ref 20–29)
Calcium: 10 mg/dL (ref 8.7–10.3)
Chloride: 101 mmol/L (ref 96–106)
Creatinine, Ser: 0.91 mg/dL (ref 0.57–1.00)
GFR calc Af Amer: 79 mL/min/{1.73_m2} (ref >59)
GFR calc non Af Amer: 69 mL/min/{1.73_m2} (ref >59)
Globulin, Total: 2.8 g/dL (ref 1.5–4.5)
Glucose: 79 mg/dL (ref 65–99)
Potassium: 4.2 mmol/L (ref 3.5–5.2)
Sodium: 139 mmol/L (ref 134–144)
Total Protein: 7.3 g/dL (ref 6.0–8.5)

## 2018-07-01 LAB — TSH: TSH: 3.57 u[IU]/mL (ref 0.450–4.500)

## 2018-07-01 LAB — T4, FREE: Free T4: 1.6 ng/dL (ref 0.82–1.77)

## 2018-07-01 LAB — VITAMIN D 25 HYDROXY (VIT D DEFICIENCY, FRACTURES): Vit D, 25-Hydroxy: 53.3 ng/mL (ref 30.0–100.0)

## 2018-07-01 LAB — INSULIN, RANDOM: INSULIN: 4.8 u[IU]/mL (ref 2.6–24.9)

## 2018-07-01 LAB — T3: T3, Total: 98 ng/dL (ref 71–180)

## 2018-07-02 ENCOUNTER — Other Ambulatory Visit (INDEPENDENT_AMBULATORY_CARE_PROVIDER_SITE_OTHER): Payer: Self-pay | Admitting: Family Medicine

## 2018-07-02 DIAGNOSIS — F3289 Other specified depressive episodes: Secondary | ICD-10-CM

## 2018-07-18 ENCOUNTER — Other Ambulatory Visit (HOSPITAL_COMMUNITY): Payer: Self-pay | Admitting: Nurse Practitioner

## 2018-07-21 ENCOUNTER — Ambulatory Visit (INDEPENDENT_AMBULATORY_CARE_PROVIDER_SITE_OTHER): Payer: 59 | Admitting: Family Medicine

## 2018-07-21 VITALS — BP 124/84 | HR 65 | Temp 97.8°F | Ht 63.0 in | Wt 273.0 lb

## 2018-07-21 DIAGNOSIS — Z6841 Body Mass Index (BMI) 40.0 and over, adult: Secondary | ICD-10-CM

## 2018-07-21 DIAGNOSIS — E559 Vitamin D deficiency, unspecified: Secondary | ICD-10-CM

## 2018-07-21 DIAGNOSIS — F3289 Other specified depressive episodes: Secondary | ICD-10-CM

## 2018-07-21 DIAGNOSIS — Z9189 Other specified personal risk factors, not elsewhere classified: Secondary | ICD-10-CM | POA: Diagnosis not present

## 2018-07-22 NOTE — Progress Notes (Signed)
Office: [phone]  /  Fax: [phone]   HPI:   Chief Complaint: OBESITY [name] is here to discuss her progress with her obesity treatment plan. She is on the  keep a food journal with 1500 calories and 85g protein  and is following her eating plan approximately 90-91% of the time. She states she is exercising by moving move daily. [name] is still wearing a boot but is getting ready to start PT next week. She is journaling and working on eating healthier but is looking forward to exercising more.  Her weight is 273 lb (123.8 kg) today and has not lost weight since her last visit. She has lost 23 lbs since starting treatment with [place].  Vitamin D deficiency [name] has a diagnosis of vitamin D deficiency. She is currently taking prescription vit D 50K IU weekly and OTC vit D 5K daily and is now at goal. She denies nausea, vomiting or muscle weakness.  Depression with emotional eating behaviors [name] is struggling with emotional eating and using food for comfort to the extent that it is negatively impacting her health. She often snacks when she is not hungry. [name] sometimes feels she is out of control and then feels guilty that she made poor food choices. She has been working on behavior modification techniques to help reduce her emotional eating and has been somewhat successful. Her mood is stable on Wellbutrin. She shows no sign of suicidal or homicidal ideations.  Depression screen PHQ 2/9 02/08/2018  Decreased Interest 3  Down, Depressed, Hopeless 3  PHQ - 2 Score 6  Altered sleeping 1  Tired, decreased energy 3  Change in appetite 2  Feeling bad or failure about yourself  3  Trouble concentrating 2  Moving slowly or fidgety/restless 3  Suicidal thoughts 0  PHQ-9 Score 20  Difficult doing work/chores Very difficult   At risk for cardiovascular disease [name] is at a higher than average risk for cardiovascular disease due to obesity. She currently denies any chest  pain.  ALLERGIES: Allergies  Allergen Reactions  . Penicillins Hives, Shortness Of Breath and Other (See Comments)    Has patient had a PCN reaction causing immediate rash, facial/tongue/throat swelling, SOB or lightheadedness with hypotension: Yes Has patient had a PCN reaction causing severe rash involving mucus membranes or skin necrosis: No Has patient had a PCN reaction that required hospitalization: No Has patient had a PCN reaction occurring within the last 10 years: No If all of the above answers are "NO", then may proceed with Cephalosporin use.   . Codeine Hives    MEDICATIONS: Current Outpatient Medications on File Prior to Visit  Medication Sig Dispense Refill  . amLODipine (NORVASC) 5 MG tablet Take 2.5 mg by mouth daily.   0  . Ascorbic Acid (VITAMIN C) 1000 MG tablet Take 1,000 mg by mouth daily.    Marland Kitchen buPROPion (WELLBUTRIN SR) 200 MG 12 hr tablet Take 1 tablet (200 mg total) by mouth 2 (two) times daily. 60 tablet 0  . clonazePAM (KLONOPIN) 0.5 MG tablet Take 0.5 mg by mouth 3 (three) times daily as needed for anxiety.   0  . DULoxetine (CYMBALTA) 30 MG capsule Take 90 mg by mouth daily.    Marland Kitchen losartan-hydrochlorothiazide (HYZAAR) 100-25 MG tablet Take 1 tablet by mouth daily.    . metoprolol tartrate (LOPRESSOR) 25 MG tablet TAKE 1/2 TABLET BY MOUTH TWICE A DAY 60 tablet 0  . pantoprazole (PROTONIX) 40 MG tablet Take 1 tablet (40 mg total)  by mouth daily. 30 tablet 1  . traMADol (ULTRAM) 50 MG tablet Take 50 mg by mouth 3 (three) times daily as needed for moderate pain.   0  . Vitamin D, Ergocalciferol, (DRISDOL) 50000 units CAPS capsule TAKE 1 CAPSULE BY MOUTH EVERY 7 DAYS 4 capsule 0  . Vitamin D, Ergocalciferol, (DRISDOL) 50000 units CAPS capsule Take 5,000 Units by mouth once.     [name] 20 MG TABS tablet Take 20 mg by mouth daily with supper.      No current facility-administered medications on file prior to visit.     PAST MEDICAL HISTORY: Past Medical  History:  Diagnosis Date  . Atrial fibrillation (HCC)   . Chronic lower back pain   . Depression   . DVT (deep venous thrombosis) (HCC) 1990s   LLE  . Fallen arches   . Hypertension   . Hyperthyroidism ~ 2000   "fine now" (04/23/2016)  . Joint pain   . Lactose intolerance   . Leg edema   . Obesity   . Pneumonia 1980s X 1  . Snoring    has not had sleep study but suspects sleep apnea    PAST SURGICAL HISTORY: Past Surgical History:  Procedure Laterality Date  . CARDIOVERSION N/A 06/18/2016   Procedure: CARDIOVERSION;  Surgeon: Laurey Morale, MD;  Location: [facility] ENDOSCOPY;  Service: Cardiovascular;  Laterality: N/A;  . CARDIOVERSION N/A 07/11/2016   Procedure: CARDIOVERSION;  Surgeon: Lewayne Bunting, MD;  Location: [facility] ENDOSCOPY;  Service: Cardiovascular;  Laterality: N/A;  . CARDIOVERSION N/A 07/02/2017   Procedure: CARDIOVERSION;  Surgeon: Thurmon Fair, MD;  Location: [facility] ENDOSCOPY;  Service: Cardiovascular;  Laterality: N/A;  . CARDIOVERSION N/A 09/29/2017   Procedure: CARDIOVERSION;  Surgeon: Laurey Morale, MD;  Location: [facility] ENDOSCOPY;  Service: Cardiovascular;  Laterality: N/A;  . ENDOMETRIAL ABLATION  ~2006  . TONSILLECTOMY  1960s  . TUBAL LIGATION  1979    SOCIAL HISTORY: Social History   Tobacco Use  . Smoking status: Former Smoker    Packs/day: 0.75    Years: 10.00    Pack years: 7.50    Types: Cigarettes  . Smokeless tobacco: Never Used  . Tobacco comment: 04/23/2016 "stopped smoking cigarettes in ~ 1989"  Substance Use Topics  . Alcohol use: Yes    Alcohol/week: 4.0 standard drinks    Types: 4 Glasses of wine per week    Comment: recently quit  . Drug use: Yes    Types: Marijuana    Comment: 04/23/2016 "recreational marijuana in my late teens/early 20's"    FAMILY HISTORY: Family History  Problem Relation Age of Onset  . Diabetes Mellitus I Father   . CVA Father   . Arthritis Mother   . Obesity Mother   . Thyroid disease Mother   . Liver disease  Mother   . CAD Maternal Grandfather     ROS: Review of Systems  Constitutional: Negative for weight loss.  Cardiovascular: Negative for chest pain.  Gastrointestinal: Negative for nausea and vomiting.  Musculoskeletal:       Negative for muscle weakness   Psychiatric/Behavioral: Positive for depression. Negative for suicidal ideas.       Negative for homicidal ideations.     PHYSICAL EXAM: Blood pressure 124/84, pulse 65, temperature 97.8 F (36.6 C), temperature source Oral, height 5\' 3"  (1.6 m), weight 273 lb (123.8 kg), SpO2 99 %. Body mass index is 48.36 kg/m. Physical Exam  Constitutional: She is oriented to person, place, and time.  She appears well-developed and well-nourished.  HENT:  Head: Normocephalic.  Cardiovascular: Normal rate.  Pulmonary/Chest: Effort normal.  Musculoskeletal: Normal range of motion.  Neurological: She is alert and oriented to person, place, and time.  Skin: Skin is warm and dry.  Psychiatric: She has a normal mood and affect. Her behavior is normal.  Nursing note and vitals reviewed.   RECENT LABS AND TESTS: BMET    Component Value Date/Time   NA 139 06/30/2018 1357   K 4.2 06/30/2018 1357   CL 101 06/30/2018 1357   CO2 19 (L) 06/30/2018 1357   GLUCOSE 79 06/30/2018 1357   GLUCOSE 80 09/24/2017 0845   BUN 25 06/30/2018 1357   CREATININE 0.91 06/30/2018 1357   CREATININE 0.66 06/13/2016 1432   CALCIUM 10.0 06/30/2018 1357   GFRNONAA 69 06/30/2018 1357   GFRAA 79 06/30/2018 1357   Lab Results  Component Value Date   HGBA1C 5.5 06/30/2018   HGBA1C 5.7 (H) 02/08/2018   Lab Results  Component Value Date   INSULIN 4.8 06/30/2018   INSULIN 5.8 02/08/2018   CBC    Component Value Date/Time   WBC 5.0 02/08/2018 1011   WBC 5.6 09/24/2017 0845   RBC 5.22 02/08/2018 1011   RBC 5.38 (H) 09/24/2017 0845   HGB 14.4 02/08/2018 1011   HCT 41.8 02/08/2018 1011   PLT 254 02/08/2018 1011   MCV 80 02/08/2018 1011   MCH 27.6 02/08/2018  1011   MCH 27.0 09/24/2017 0845   MCHC 34.4 02/08/2018 1011   MCHC 32.7 09/24/2017 0845   RDW 14.2 02/08/2018 1011   LYMPHSABS 1.3 02/08/2018 1011   MONOABS 528 06/13/2016 1432   EOSABS 0.2 02/08/2018 1011   BASOSABS 0.0 02/08/2018 1011   Iron/TIBC/Ferritin/ %Sat No results found for: IRON, TIBC, FERRITIN, IRONPCTSAT Lipid Panel     Component Value Date/Time   CHOL 166 02/08/2018 1011   TRIG 75 02/08/2018 1011   HDL 75 02/08/2018 1011   CHOLHDL 2.2 02/08/2018 1011   LDLCALC 76 02/08/2018 1011   Hepatic Function Panel     Component Value Date/Time   PROT 7.3 06/30/2018 1357   ALBUMIN 4.5 06/30/2018 1357   AST 18 06/30/2018 1357   ALT 19 06/30/2018 1357   ALKPHOS 61 06/30/2018 1357   BILITOT 0.5 06/30/2018 1357      Component Value Date/Time   TSH 3.570 06/30/2018 1357   TSH 4.800 (H) 02/08/2018 1011   TSH 6.839 (H) 10/12/2017 0931   TSH 4.949 (H) 06/26/2017 0905   TSH 3.873 04/22/2016 1721     Ref. Range 06/30/2018 13:57  Vitamin D, 25-Hydroxy Latest Ref Range: 30.0 - 100.0 ng/mL 53.3   ASSESSMENT AND PLAN: Vitamin D deficiency  Other depression - with emotional eating  At risk for heart disease  Class 3 severe obesity with serious comorbidity and body mass index (BMI) of 45.0 to 49.9 in adult, unspecified obesity type (HCC)  PLAN: Vitamin D Deficiency [name] was informed that low vitamin D levels contributes to fatigue and are associated with obesity, breast, and colon cancer. She agrees to continue to take prescription Vit D @50 ,000 IU every week and OTC vitamin D supplement and will follow up for routine testing of vitamin D, at least 2-3 times per year. She was informed of the risk of over-replacement of vitamin D and agrees to not increase her dose unless she discusses this with [place] first.  Depression with Emotional Eating Behaviors We discussed behavior modification techniques today to help  [name] deal with her emotional eating and depression. She has agreed  to decrease Wellbutrin SR 200 mg qd and agreed to follow up as directed.  Cardiovascular risk counseling [name] was given extended (15 minutes) coronary artery disease prevention counseling today. She is 62 y.o. female and has risk factors for heart disease including obesity. We discussed intensive lifestyle modifications today with an emphasis on specific weight loss instructions and strategies. Pt was also informed of the importance of increasing exercise and decreasing saturated fats to help prevent heart disease.  Obesity [name] is currently in the action stage of change. As such, her goal is to continue with weight loss efforts She has agreed to keep a food journal with 1500 calories and 85g protein  [name] has been instructed to work up to a goal of 150 minutes of combined cardio and strengthening exercise per week for weight loss and overall health benefits. We discussed the following Behavioral Modification Strategies today: increasing lean protein intake and decreasing simple carbohydrates    [name] has agreed to follow up with our clinic in 3 weeks. She was informed of the importance of frequent follow up visits to maximize her success with intensive lifestyle modifications for her multiple health conditions.   OBESITY BEHAVIORAL INTERVENTION VISIT  Today's visit was # 8   Starting weight: 296 lb Starting date: 02/08/18 Today's weight : 273 lb Today's date: 07/21/18 Total lbs lost to date: 23 lb    ASK: We discussed the diagnosis of obesity with [name] today and [name] agreed to give [place] permission to discuss obesity behavioral modification therapy today.  ASSESS: [name] has the diagnosis of obesity and her BMI today is 48.37 [name] is in the action stage of change   ADVISE: [name] was educated on the multiple health risks of obesity as well as the benefit of weight loss to improve her health. She was advised of the need for long term treatment and the importance  of lifestyle modifications to improve her current health and to decrease her risk of future health problems.  AGREE: Multiple dietary modification options and treatment options were discussed and  [name] agreed to follow the recommendations documented in the above note.  ARRANGE: [name] was educated on the importance of frequent visits to treat obesity as outlined per CMS and USPSTF guidelines and agreed to schedule her next follow up appointment today.  I, Jeralene Peters, am acting as transcriptionist for Quillian Quince, MD   I have reviewed the above documentation for accuracy and completeness, and I agree with the above. -Quillian Quince, MD

## 2018-07-26 ENCOUNTER — Other Ambulatory Visit (INDEPENDENT_AMBULATORY_CARE_PROVIDER_SITE_OTHER): Payer: Self-pay | Admitting: Family Medicine

## 2018-07-26 DIAGNOSIS — E559 Vitamin D deficiency, unspecified: Secondary | ICD-10-CM

## 2018-07-28 ENCOUNTER — Other Ambulatory Visit (INDEPENDENT_AMBULATORY_CARE_PROVIDER_SITE_OTHER): Payer: Self-pay | Admitting: Family Medicine

## 2018-07-28 DIAGNOSIS — F3289 Other specified depressive episodes: Secondary | ICD-10-CM

## 2018-08-10 ENCOUNTER — Ambulatory Visit: Payer: 59

## 2018-08-10 ENCOUNTER — Other Ambulatory Visit (INDEPENDENT_AMBULATORY_CARE_PROVIDER_SITE_OTHER): Payer: Self-pay | Admitting: Family Medicine

## 2018-08-10 DIAGNOSIS — F3289 Other specified depressive episodes: Secondary | ICD-10-CM

## 2018-08-11 ENCOUNTER — Ambulatory Visit (INDEPENDENT_AMBULATORY_CARE_PROVIDER_SITE_OTHER): Payer: 59 | Admitting: Family Medicine

## 2018-08-11 VITALS — BP 107/71 | HR 68 | Temp 97.9°F | Ht 63.0 in | Wt 269.0 lb

## 2018-08-11 DIAGNOSIS — F3289 Other specified depressive episodes: Secondary | ICD-10-CM | POA: Diagnosis not present

## 2018-08-11 DIAGNOSIS — Z9189 Other specified personal risk factors, not elsewhere classified: Secondary | ICD-10-CM

## 2018-08-11 DIAGNOSIS — Z6841 Body Mass Index (BMI) 40.0 and over, adult: Secondary | ICD-10-CM

## 2018-08-11 DIAGNOSIS — E559 Vitamin D deficiency, unspecified: Secondary | ICD-10-CM | POA: Diagnosis not present

## 2018-08-11 DIAGNOSIS — I10 Essential (primary) hypertension: Secondary | ICD-10-CM

## 2018-08-11 MED ORDER — BUPROPION HCL ER (SR) 200 MG PO TB12: 200.0000 mg | ORAL_TABLET | Freq: Every day | ORAL | 0 refills | Status: DC

## 2018-08-16 NOTE — Progress Notes (Signed)
Office: [phone]  /  Fax: [phone]   HPI:   Chief Complaint: OBESITY [name] is here to discuss her progress with her obesity treatment plan. She is on the keep a food journal with 1500 calories and 85 grams of protein daily and is following her eating plan approximately 92 % of the time. She states she is exercising 0 minutes 0 times per week. [name] is doing well with journaling. She is very encouraged by weight loss today. She will start physical therapy status post left ankle fusion next week 3 times per week.  Her weight is 269 lb (122 kg) today and has had a weight loss of 4 pounds over a period of 3 weeks since her last visit. She has lost 27 lbs since starting treatment with [place].  Vitamin D Deficiency [name] has a diagnosis of vitamin D deficiency. She is stable on prescription Vit D and denies nausea, vomiting or muscle weakness.  At risk for osteopenia and osteoporosis [name] is at higher risk of osteopenia and osteoporosis due to vitamin D deficiency.   Hypertension [name] is a 62 y.o. female with hypertension. [name] discontinued amlodipine per her primary care physician because her blood pressure's has improved. Her blood pressure is stable today and she denies chest pain. She is working weight loss to help control her blood pressure with the goal of decreasing her risk of heart attack and stroke. [name]'s blood pressure is currently controlled.  Depression with emotional eating behaviors [name] still reports emotional eating but she feels bupropion helps. [name] struggles with emotional eating and using food for comfort to the extent that it is negatively impacting her health. She often snacks when she is not hungry. [name] sometimes feels she is out of control and then feels guilty that she made poor food choices. She has been working on behavior modification techniques to help reduce her emotional eating and has been somewhat successful. She shows no sign of  suicidal or homicidal ideations.  Depression screen PHQ 2/9 02/08/2018  Decreased Interest 3  Down, Depressed, Hopeless 3  PHQ - 2 Score 6  Altered sleeping 1  Tired, decreased energy 3  Change in appetite 2  Feeling bad or failure about yourself  3  Trouble concentrating 2  Moving slowly or fidgety/restless 3  Suicidal thoughts 0  PHQ-9 Score 20  Difficult doing work/chores Very difficult    ALLERGIES: Allergies  Allergen Reactions  . Penicillins Hives, Shortness Of Breath and Other (See Comments)    Has patient had a PCN reaction causing immediate rash, facial/tongue/throat swelling, SOB or lightheadedness with hypotension: Yes Has patient had a PCN reaction causing severe rash involving mucus membranes or skin necrosis: No Has patient had a PCN reaction that required hospitalization: No Has patient had a PCN reaction occurring within the last 10 years: No If all of the above answers are "NO", then may proceed with Cephalosporin use.   . Codeine Hives    MEDICATIONS: Current Outpatient Medications on File Prior to Visit  Medication Sig Dispense Refill  . Ascorbic Acid (VITAMIN C) 1000 MG tablet Take 1,000 mg by mouth daily.    . DULoxetine (CYMBALTA) 30 MG capsule Take 90 mg by mouth daily.    Marland Kitchen losartan-hydrochlorothiazide (HYZAAR) 100-25 MG tablet Take 1 tablet by mouth daily.    . metoprolol tartrate (LOPRESSOR) 25 MG tablet TAKE 1/2 TABLET BY MOUTH TWICE A DAY 60 tablet 0  . pantoprazole (PROTONIX) 40 MG tablet Take 1 tablet (40  mg total) by mouth daily. 30 tablet 1  . traMADol (ULTRAM) 50 MG tablet Take 50 mg by mouth 3 (three) times daily as needed for moderate pain.   0  . Vitamin D, Ergocalciferol, (DRISDOL) 50000 units CAPS capsule TAKE 1 CAPSULE BY MOUTH EVERY 7 DAYS 4 capsule 0  . XARELTO 20 MG TABS tablet Take 20 mg by mouth daily with supper.      No current facility-administered medications on file prior to visit.     PAST MEDICAL HISTORY: Past Medical  History:  Diagnosis Date  . Atrial fibrillation (HCC)   . Chronic lower back pain   . Depression   . DVT (deep venous thrombosis) (HCC) 1990s   LLE  . Fallen arches   . Hypertension   . Hyperthyroidism ~ 2000   "fine now" (04/23/2016)  . Joint pain   . Lactose intolerance   . Leg edema   . Obesity   . Pneumonia 1980s X 1  . Snoring    has not had sleep study but suspects sleep apnea    PAST SURGICAL HISTORY: Past Surgical History:  Procedure Laterality Date  . CARDIOVERSION N/A 06/18/2016   Procedure: CARDIOVERSION;  Surgeon: Laurey Morale, MD;  Location: [facility] ENDOSCOPY;  Service: Cardiovascular;  Laterality: N/A;  . CARDIOVERSION N/A 07/11/2016   Procedure: CARDIOVERSION;  Surgeon: Lewayne Bunting, MD;  Location: [facility] ENDOSCOPY;  Service: Cardiovascular;  Laterality: N/A;  . CARDIOVERSION N/A 07/02/2017   Procedure: CARDIOVERSION;  Surgeon: Thurmon Fair, MD;  Location: [facility] ENDOSCOPY;  Service: Cardiovascular;  Laterality: N/A;  . CARDIOVERSION N/A 09/29/2017   Procedure: CARDIOVERSION;  Surgeon: Laurey Morale, MD;  Location: [facility] ENDOSCOPY;  Service: Cardiovascular;  Laterality: N/A;  . ENDOMETRIAL ABLATION  ~2006  . TONSILLECTOMY  1960s  . TUBAL LIGATION  1979    SOCIAL HISTORY: Social History   Tobacco Use  . Smoking status: Former Smoker    Packs/day: 0.75    Years: 10.00    Pack years: 7.50    Types: Cigarettes  . Smokeless tobacco: Never Used  . Tobacco comment: 04/23/2016 "stopped smoking cigarettes in ~ 1989"  Substance Use Topics  . Alcohol use: Yes    Alcohol/week: 4.0 standard drinks    Types: 4 Glasses of wine per week    Comment: recently quit  . Drug use: Yes    Types: Marijuana    Comment: 04/23/2016 "recreational marijuana in my late teens/early 20's"    FAMILY HISTORY: Family History  Problem Relation Age of Onset  . Diabetes Mellitus I Father   . CVA Father   . Arthritis Mother   . Obesity Mother   . Thyroid disease Mother   . Liver disease  Mother   . CAD Maternal Grandfather     ROS: Review of Systems  Constitutional: Positive for weight loss.  Cardiovascular: Negative for chest pain.  Gastrointestinal: Negative for nausea and vomiting.  Musculoskeletal:       Negative muscle weakness  Psychiatric/Behavioral: Positive for depression. Negative for suicidal ideas.    PHYSICAL EXAM: Blood pressure 107/71, pulse 68, temperature 97.9 F (36.6 C), temperature source Oral, height 5\' 3"  (1.6 m), weight 269 lb (122 kg), SpO2 97 %. Body mass index is 47.65 kg/m. Physical Exam  Constitutional: She is oriented to person, place, and time. She appears well-developed and well-nourished.  Cardiovascular: Normal rate.  Pulmonary/Chest: Effort normal.  Musculoskeletal: Normal range of motion.  Neurological: She is oriented to person, place, and time.  Skin: Skin is warm and dry.  Psychiatric: She has a normal mood and affect. Her behavior is normal.  Vitals reviewed.   RECENT LABS AND TESTS: BMET    Component Value Date/Time   NA 139 06/30/2018 1357   K 4.2 06/30/2018 1357   CL 101 06/30/2018 1357   CO2 19 (L) 06/30/2018 1357   GLUCOSE 79 06/30/2018 1357   GLUCOSE 80 09/24/2017 0845   BUN 25 06/30/2018 1357   CREATININE 0.91 06/30/2018 1357   CREATININE 0.66 06/13/2016 1432   CALCIUM 10.0 06/30/2018 1357   GFRNONAA 69 06/30/2018 1357   GFRAA 79 06/30/2018 1357   Lab Results  Component Value Date   HGBA1C 5.5 06/30/2018   HGBA1C 5.7 (H) 02/08/2018   Lab Results  Component Value Date   INSULIN 4.8 06/30/2018   INSULIN 5.8 02/08/2018   CBC    Component Value Date/Time   WBC 5.0 02/08/2018 1011   WBC 5.6 09/24/2017 0845   RBC 5.22 02/08/2018 1011   RBC 5.38 (H) 09/24/2017 0845   HGB 14.4 02/08/2018 1011   HCT 41.8 02/08/2018 1011   PLT 254 02/08/2018 1011   MCV 80 02/08/2018 1011   MCH 27.6 02/08/2018 1011   MCH 27.0 09/24/2017 0845   MCHC 34.4 02/08/2018 1011   MCHC 32.7 09/24/2017 0845   RDW 14.2  02/08/2018 1011   LYMPHSABS 1.3 02/08/2018 1011   MONOABS 528 06/13/2016 1432   EOSABS 0.2 02/08/2018 1011   BASOSABS 0.0 02/08/2018 1011   Iron/TIBC/Ferritin/ %Sat No results found for: IRON, TIBC, FERRITIN, IRONPCTSAT Lipid Panel     Component Value Date/Time   CHOL 166 02/08/2018 1011   TRIG 75 02/08/2018 1011   HDL 75 02/08/2018 1011   CHOLHDL 2.2 02/08/2018 1011   LDLCALC 76 02/08/2018 1011   Hepatic Function Panel     Component Value Date/Time   PROT 7.3 06/30/2018 1357   ALBUMIN 4.5 06/30/2018 1357   AST 18 06/30/2018 1357   ALT 19 06/30/2018 1357   ALKPHOS 61 06/30/2018 1357   BILITOT 0.5 06/30/2018 1357      Component Value Date/Time   TSH 3.570 06/30/2018 1357   TSH 4.800 (H) 02/08/2018 1011   TSH 6.839 (H) 10/12/2017 0931   TSH 4.949 (H) 06/26/2017 0905   TSH 3.873 04/22/2016 1721  Results for ALAIJAH, GIBLER (MRN [id number]) as of 08/16/2018 16:12  Ref. Range 06/30/2018 13:57  Vitamin D, 25-Hydroxy Latest Ref Range: 30.0 - 100.0 ng/mL 53.3    ASSESSMENT AND PLAN: Vitamin D deficiency  Essential hypertension  Other depression - with emotional eating - Plan: buPROPion (WELLBUTRIN SR) 200 MG 12 hr tablet  At risk for osteoporosis  Class 3 severe obesity with serious comorbidity and body mass index (BMI) of 45.0 to 49.9 in adult, unspecified obesity type (HCC)  PLAN:  Vitamin D Deficiency [name] was informed that low vitamin D levels contributes to fatigue and are associated with obesity, breast, and colon cancer. [name] agrees to continue taking prescription Vit D @50 ,000 IU every week, no refill needed. She will follow up for routine testing of vitamin D, at least 2-3 times per year. She was informed of the risk of over-replacement of vitamin D and agrees to not increase her dose unless she discusses this with [place] first. [name] agrees to follow up with our clinic in 3 weeks.  At risk for osteopenia and osteoporosis [name] was given extended (15  minutes) osteoporosis prevention counseling today. [name] is at risk for  osteopenia and osteoporsis due to her vitamin D deficiency. She was encouraged to take her vitamin D and follow her higher calcium diet and increase strengthening exercise to help strengthen her bones and decrease her risk of osteopenia and osteoporosis.  Hypertension We discussed sodium restriction, working on healthy weight loss, and a regular exercise program as the means to achieve improved blood pressure control. [name] agreed with this plan and agreed to follow up as directed. We will continue to monitor her blood pressure as well as her progress with the above lifestyle modifications. [name] agrees to continue taking Losartan-hydrochlorothiazide per her primary care physician and she will continue taking metoprolol. She will watch for signs of hypotension as she continues her lifestyle modifications. [name] agrees to follow up with our clinic in 3 weeks.  Depression with Emotional Eating Behaviors We discussed behavior modification techniques today to help [name] deal with her emotional eating and depression. [name] agrees to continue taking bupropion SR 200 mg qd #30 and we will refill for 1 month. [name] agrees to follow up with our clinic in 3 weeks.  Obesity [name] is currently in the action stage of change. As such, her goal is to continue with weight loss efforts She has agreed to keep a food journal with 1500 calories and 85 grams of protein daily [name] has been instructed to work up to a goal of 150 minutes of combined cardio and strengthening exercise per week for weight loss and overall health benefits. We discussed the following Behavioral Modification Strategies today: increasing lean protein intake, work on meal planning and easy cooking plans, keeping healthy foods in the home, better snacking choices, and planning for success   [name] has agreed to follow up with our clinic in 3 weeks. She was informed of  the importance of frequent follow up visits to maximize her success with intensive lifestyle modifications for her multiple health conditions.   OBESITY BEHAVIORAL INTERVENTION VISIT  Today's visit was # 9   Starting weight: 296 lbs Starting date: 02/08/18 Today's weight : 269 lbs  Today's date: 08/11/2018 Total lbs lost to date: 37    ASK: We discussed the diagnosis of obesity with [name] today and [name] agreed to give [place] permission to discuss obesity behavioral modification therapy today.  ASSESS: [name] has the diagnosis of obesity and her BMI today is 47.66 Shannelle is in the action stage of change   ADVISE: Linnaea was educated on the multiple health risks of obesity as well as the benefit of weight loss to improve her health. She was advised of the need for long term treatment and the importance of lifestyle modifications to improve her current health and to decrease her risk of future health problems.  AGREE: Multiple dietary modification options and treatment options were discussed and  Tenishia agreed to follow the recommendations documented in the above note.  ARRANGE: Lisa-Marie was educated on the importance of frequent visits to treat obesity as outlined per CMS and USPSTF guidelines and agreed to schedule her next follow up appointment today.  I, Burt Knack, am acting as transcriptionist for Quillian Quince, MD  I have reviewed the above documentation for accuracy and completeness, and I agree with the above. -Quillian Quince, MD

## 2018-08-23 ENCOUNTER — Other Ambulatory Visit (INDEPENDENT_AMBULATORY_CARE_PROVIDER_SITE_OTHER): Payer: Self-pay | Admitting: Family Medicine

## 2018-08-23 DIAGNOSIS — E559 Vitamin D deficiency, unspecified: Secondary | ICD-10-CM

## 2018-08-24 ENCOUNTER — Emergency Department (HOSPITAL_COMMUNITY)
Admission: EM | Admit: 2018-08-24 | Discharge: 2018-08-25 | Disposition: A | Discharge status: Other Acute Inpatient Hospital | Payer: 59 | Attending: Emergency Medicine | Admitting: Emergency Medicine

## 2018-08-24 ENCOUNTER — Other Ambulatory Visit: Payer: Self-pay

## 2018-08-24 ENCOUNTER — Encounter (HOSPITAL_COMMUNITY): Payer: Self-pay | Admitting: Emergency Medicine

## 2018-08-24 ENCOUNTER — Ambulatory Visit: Payer: 59

## 2018-08-24 ENCOUNTER — Emergency Department (HOSPITAL_COMMUNITY): Payer: 59

## 2018-08-24 DIAGNOSIS — I10 Essential (primary) hypertension: Secondary | ICD-10-CM | POA: Insufficient documentation

## 2018-08-24 DIAGNOSIS — L02612 Cutaneous abscess of left foot: Secondary | ICD-10-CM

## 2018-08-24 DIAGNOSIS — M65072 Abscess of tendon sheath, left ankle and foot: Secondary | ICD-10-CM | POA: Diagnosis not present

## 2018-08-24 DIAGNOSIS — Z79899 Other long term (current) drug therapy: Secondary | ICD-10-CM | POA: Diagnosis not present

## 2018-08-24 DIAGNOSIS — E876 Hypokalemia: Secondary | ICD-10-CM | POA: Insufficient documentation

## 2018-08-24 DIAGNOSIS — L03116 Cellulitis of left lower limb: Secondary | ICD-10-CM | POA: Insufficient documentation

## 2018-08-24 DIAGNOSIS — Z87891 Personal history of nicotine dependence: Secondary | ICD-10-CM | POA: Insufficient documentation

## 2018-08-24 DIAGNOSIS — Z7901 Long term (current) use of anticoagulants: Secondary | ICD-10-CM | POA: Insufficient documentation

## 2018-08-24 DIAGNOSIS — M25572 Pain in left ankle and joints of left foot: Secondary | ICD-10-CM | POA: Diagnosis present

## 2018-08-24 LAB — URINALYSIS, ROUTINE W REFLEX MICROSCOPIC
Bilirubin Urine: NEGATIVE
Glucose, UA: NEGATIVE mg/dL
Hgb urine dipstick: NEGATIVE
Ketones, ur: NEGATIVE mg/dL
Nitrite: NEGATIVE
Protein, ur: 30 mg/dL — AB
Specific Gravity, Urine: 1.039 — ABNORMAL HIGH (ref 1.005–1.030)
pH: 5 (ref 5.0–8.0)

## 2018-08-24 LAB — CBC WITH DIFFERENTIAL/PLATELET
Abs Immature Granulocytes: 0.18 10*3/uL — ABNORMAL HIGH (ref 0.00–0.07)
Basophils Absolute: 0.1 10*3/uL (ref 0.0–0.1)
Basophils Relative: 1 %
Eosinophils Absolute: 0.1 10*3/uL (ref 0.0–0.5)
Eosinophils Relative: 1 %
HCT: 40.3 % (ref 36.0–46.0)
Hemoglobin: 12.9 g/dL (ref 12.0–15.0)
Immature Granulocytes: 2 %
Lymphocytes Relative: 13 %
Lymphs Abs: 1.4 10*3/uL (ref 0.7–4.0)
MCH: 27.1 pg (ref 26.0–34.0)
MCHC: 32 g/dL (ref 30.0–36.0)
MCV: 84.7 fL (ref 80.0–100.0)
Monocytes Absolute: 1.9 10*3/uL — ABNORMAL HIGH (ref 0.1–1.0)
Monocytes Relative: 17 %
Neutro Abs: 7.5 10*3/uL (ref 1.7–7.7)
Neutrophils Relative %: 66 %
Platelets: 471 10*3/uL — ABNORMAL HIGH (ref 150–400)
RBC: 4.76 MIL/uL (ref 3.87–5.11)
RDW: 14 % (ref 11.5–15.5)
WBC: 11.2 10*3/uL — ABNORMAL HIGH (ref 4.0–10.5)
nRBC: 0 % (ref 0.0–0.2)

## 2018-08-24 LAB — COMPREHENSIVE METABOLIC PANEL
ALT: 34 U/L (ref 0–44)
AST: 27 U/L (ref 15–41)
Albumin: 2.7 g/dL — ABNORMAL LOW (ref 3.5–5.0)
Alkaline Phosphatase: 177 U/L — ABNORMAL HIGH (ref 38–126)
Anion gap: 12 (ref 5–15)
BUN: 19 mg/dL (ref 6–20)
CO2: 24 mmol/L (ref 22–32)
Calcium: 8.9 mg/dL (ref 8.9–10.3)
Chloride: 99 mmol/L (ref 98–111)
Creatinine, Ser: 0.71 mg/dL (ref 0.44–1.00)
GFR calc Af Amer: >60 mL/min (ref >60)
GFR calc non Af Amer: >60 mL/min (ref >60)
Glucose, Bld: 97 mg/dL (ref 70–99)
Potassium: 2.9 mmol/L — ABNORMAL LOW (ref 3.5–5.1)
Sodium: 135 mmol/L (ref 135–145)
Total Bilirubin: 1.1 mg/dL (ref 0.3–1.2)
Total Protein: 6.5 g/dL (ref 6.5–8.1)

## 2018-08-24 LAB — I-STAT BETA HCG BLOOD, ED (MC, WL, AP ONLY): I-stat hCG, quantitative: 8.6 m[IU]/mL — ABNORMAL HIGH (ref <5)

## 2018-08-24 LAB — I-STAT CG4 LACTIC ACID, ED
Lactic Acid, Venous: 1.32 mmol/L (ref 0.5–1.9)
Lactic Acid, Venous: 1.14 mmol/L (ref 0.5–1.9)

## 2018-08-24 LAB — SEDIMENTATION RATE: Sed Rate: 99 mm/hr — ABNORMAL HIGH (ref 0–22)

## 2018-08-24 MED ORDER — IOHEXOL 300 MG/ML  SOLN
100.0000 mL | Freq: Once | INTRAMUSCULAR | Status: AC | PRN
  Administered 2018-08-24: 100 mL via INTRAVENOUS

## 2018-08-24 MED ORDER — SODIUM CHLORIDE 0.9 % IV BOLUS
1000.0000 mL | Freq: Once | INTRAVENOUS | Status: AC
  Administered 2018-08-24: 1000 mL via INTRAVENOUS

## 2018-08-24 MED ORDER — FENTANYL CITRATE (PF) 100 MCG/2ML IJ SOLN
50.0000 ug | Freq: Once | INTRAMUSCULAR | Status: AC
  Administered 2018-08-24: 50 ug via INTRAVENOUS
  Filled 2018-08-24: qty 2

## 2018-08-24 MED ORDER — HYDROMORPHONE HCL 1 MG/ML IJ SOLN
0.5000 mg | Freq: Once | INTRAMUSCULAR | Status: AC
  Administered 2018-08-24: 0.5 mg via INTRAVENOUS
  Filled 2018-08-24: qty 1

## 2018-08-24 NOTE — ED Provider Notes (Signed)
[facility] EMERGENCY DEPARTMENT Provider Note   CSN: [id number] Arrival date & time: 08/24/18  1414     History   Chief Complaint Chief Complaint  Patient presents with  . Ankle Pain    HPI [name] is a 62 y.o. female.  Patient with previous left ankle surgery/fusion at [facility] in June of this year presents with worsening pain, swelling, flulike symptoms over the past 6 days.  Patient began having severe pain that was worse with bearing weight in the area of the left lateral ankle and foot.  She has noted increasing swelling and warmth of her left lower extremity.  She has had some drainage from the area.  In addition she has had overall malaise with subjective fevers and chills.  She has been taking Tylenol with improvement of the fever.  Pain continues to be severe.  She was finally able to talk with her orthopedic doctor today who recommended PCP eval.  Denies recent antibiotic use or other infections.     Past Medical History:  Diagnosis Date  . Atrial fibrillation (HCC)   . Chronic lower back pain   . Depression   . DVT (deep venous thrombosis) (HCC) 1990s   LLE  . Fallen arches   . Hypertension   . Hyperthyroidism ~ 2000   "fine now" (04/23/2016)  . Joint pain   . Lactose intolerance   . Leg edema   . Obesity   . Pneumonia 1980s X 1  . Snoring    has not had sleep study but suspects sleep apnea    Patient Active Problem List   Diagnosis Date Noted  . Atrial fibrillation (HCC)   . Chest pain 04/22/2016  . Hypokalemia 04/22/2016  . Morbid obesity (HCC) 04/22/2016  . Hypertension 04/22/2016  . Paroxysmal A-fib (HCC) 04/22/2016  . Hypomagnesemia 04/22/2016    Past Surgical History:  Procedure Laterality Date  . CARDIOVERSION N/A 06/18/2016   Procedure: CARDIOVERSION;  Surgeon: Laurey Morale, MD;  Location: [facility] ENDOSCOPY;  Service: Cardiovascular;  Laterality: N/A;  . CARDIOVERSION N/A 07/11/2016   Procedure: CARDIOVERSION;   Surgeon: Lewayne Bunting, MD;  Location: [facility] ENDOSCOPY;  Service: Cardiovascular;  Laterality: N/A;  . CARDIOVERSION N/A 07/02/2017   Procedure: CARDIOVERSION;  Surgeon: Thurmon Fair, MD;  Location: [facility] ENDOSCOPY;  Service: Cardiovascular;  Laterality: N/A;  . CARDIOVERSION N/A 09/29/2017   Procedure: CARDIOVERSION;  Surgeon: Laurey Morale, MD;  Location: [facility] ENDOSCOPY;  Service: Cardiovascular;  Laterality: N/A;  . ENDOMETRIAL ABLATION  ~2006  . TONSILLECTOMY  1960s  . TUBAL LIGATION  1979     OB History    Gravida  2   Para      Term      Preterm      AB      Living  2     SAB      TAB      Ectopic      Multiple      Live Births               Home Medications    Prior to Admission medications   Medication Sig Start Date End Date Taking? Authorizing Provider  Ascorbic Acid (VITAMIN C) 1000 MG tablet Take 1,000 mg by mouth daily.    [provider]  buPROPion (WELLBUTRIN SR) 200 MG 12 hr tablet Take 1 tablet (200 mg total) by mouth daily. 08/11/18   Quillian Quince D, MD  DULoxetine (CYMBALTA) 30 MG  capsule Take 90 mg by mouth daily.    [provider]  losartan-hydrochlorothiazide (HYZAAR) 100-25 MG tablet Take 1 tablet by mouth daily. 07/31/16   Marily Lente, NP  metoprolol tartrate (LOPRESSOR) 25 MG tablet TAKE 1/2 TABLET BY MOUTH TWICE A DAY 07/19/18   Newman Nip, NP  pantoprazole (PROTONIX) 40 MG tablet Take 1 tablet (40 mg total) by mouth daily. 04/24/16   Barnetta Chapel, MD  traMADol (ULTRAM) 50 MG tablet Take 50 mg by mouth 3 (three) times daily as needed for moderate pain.  04/20/16   [provider]  Vitamin D, Ergocalciferol, (DRISDOL) 50000 units CAPS capsule TAKE 1 CAPSULE BY MOUTH EVERY 7 DAYS 07/26/18   Quillian Quince D, MD  XARELTO 20 MG TABS tablet Take 20 mg by mouth daily with supper.  05/01/16   [provider]    Family History Family History  Problem Relation Age of Onset  . Diabetes Mellitus I  Father   . CVA Father   . Arthritis Mother   . Obesity Mother   . Thyroid disease Mother   . Liver disease Mother   . CAD Maternal Grandfather     Social History Social History   Tobacco Use  . Smoking status: Former Smoker    Packs/day: 0.75    Years: 10.00    Pack years: 7.50    Types: Cigarettes  . Smokeless tobacco: Never Used  . Tobacco comment: 04/23/2016 "stopped smoking cigarettes in ~ 1989"  Substance Use Topics  . Alcohol use: Yes    Alcohol/week: 4.0 standard drinks    Types: 4 Glasses of wine per week    Comment: recently quit  . Drug use: Yes    Types: Marijuana    Comment: 04/23/2016 "recreational marijuana in my late teens/early 20's"     Allergies   Penicillins and Codeine   Review of Systems Review of Systems  Constitutional: Positive for chills, fatigue and fever.  HENT: Negative for rhinorrhea and sore throat.   Eyes: Negative for redness.  Respiratory: Negative for cough.   Cardiovascular: Negative for chest pain.  Gastrointestinal: Positive for nausea. Negative for abdominal pain, diarrhea and vomiting.  Genitourinary: Negative for dysuria.  Musculoskeletal: Positive for arthralgias, joint swelling and myalgias.  Skin: Negative for rash.  Neurological: Negative for headaches.     Physical Exam Updated Vital Signs BP 111/65 (BP Location: Left Arm)   Pulse (!) 102   Temp 98.6 F (37 C) (Oral)   Resp 16   SpO2 95%   Physical Exam  Constitutional: She appears well-developed and well-nourished.  HENT:  Head: Normocephalic and atraumatic.  Eyes: Conjunctivae are normal. Right eye exhibits no discharge. Left eye exhibits no discharge.  Neck: Normal range of motion. Neck supple.  Cardiovascular: Normal rate, regular rhythm and normal heart sounds.  Pulmonary/Chest: Effort normal and breath sounds normal.  Abdominal: Soft. There is no tenderness.  Musculoskeletal:       Left knee: Normal. No tenderness found. No medial joint line  tenderness noted.       Left ankle: She exhibits decreased range of motion and swelling. Tenderness.       Legs:      Left foot: There is tenderness.       Feet:  Neurological: She is alert.  Skin: Skin is warm and dry.  Psychiatric: She has a normal mood and affect.  Nursing note and vitals reviewed.    ED Treatments / Results  Labs (all  labs ordered are listed, but only abnormal results are displayed) Labs Reviewed  COMPREHENSIVE METABOLIC PANEL - Abnormal; Notable for the following components:      Result Value   Potassium 2.9 (*)    Albumin 2.7 (*)    Alkaline Phosphatase 177 (*)    All other components within normal limits  CBC WITH DIFFERENTIAL/PLATELET - Abnormal; Notable for the following components:   WBC 11.2 (*)    Platelets 471 (*)    Monocytes Absolute 1.9 (*)    Abs Immature Granulocytes 0.18 (*)    All other components within normal limits  URINALYSIS, ROUTINE W REFLEX MICROSCOPIC - Abnormal; Notable for the following components:   Color, Urine AMBER (*)    Specific Gravity, Urine 1.039 (*)    Protein, ur 30 (*)    Leukocytes, UA TRACE (*)    Bacteria, UA RARE (*)    All other components within normal limits  SEDIMENTATION RATE - Abnormal; Notable for the following components:   Sed Rate 99 (*)    All other components within normal limits  I-STAT BETA HCG BLOOD, ED ([facility], [facility], [facility] ONLY) - Abnormal; Notable for the following components:   I-stat hCG, quantitative 8.6 (*)    All other components within normal limits  CULTURE, BLOOD (ROUTINE X 2)  CULTURE, BLOOD (ROUTINE X 2)  I-STAT CG4 LACTIC ACID, ED  I-STAT CG4 LACTIC ACID, ED    EKG None  Radiology Dg Chest 2 View  Result Date: 08/24/2018 CLINICAL DATA:  Fever. EXAM: CHEST - 2 VIEW COMPARISON:  None. FINDINGS: The heart size and mediastinal contours are within normal limits. Both lungs are clear. The visualized skeletal structures are unremarkable. IMPRESSION: No active cardiopulmonary disease.  Electronically Signed   By: Lupita Raider, M.D.   On: 08/24/2018 16:52   Dg Ankle Complete Left  Result Date: 08/24/2018 CLINICAL DATA:  Left ankle swelling. EXAM: LEFT ANKLE COMPLETE - 3+ VIEW COMPARISON:  None. FINDINGS: Status post surgical fusion of the talonavicular and talocalcaneal joints. No fracture or dislocation is noted. No acute fracture or dislocation is noted. Soft tissue swelling is seen over lateral malleolus. IMPRESSION: Postsurgical changes as described above. Soft tissue swelling seen over lateral malleolus. No acute fracture or dislocation is noted. Electronically Signed   By: Lupita Raider, M.D.   On: 08/24/2018 16:53   Ct Ankle Left W Contrast  Result Date: 08/24/2018 CLINICAL DATA:  Left ankle pain and swelling with flu-like symptoms 1 week. History of previous left ankle surgery June 2019 redness and swelling. EXAM: CT OF THE LOWER LEFT EXTREMITY WITH CONTRAST TECHNIQUE: Multidetector CT imaging of the lower left extremity was performed according to the standard protocol following intravenous contrast administration. COMPARISON:  Plain films 08/24/2018 CONTRAST:  OMNIPAQUE IOHEXOL 300 MG/ML  SOLN FINDINGS: Bones/Joint/Cartilage There are 2 orthopedic screws extending from posterior to anterior through the calcaneus into the talus intact. There is surgical ankylosis with bone stable over the dorsal talonavicular region. There is lateral plate with screws over the calcaneocuboid joint intact. There is moderate widening of the lateral aspect of the ankle mortise. No acute fracture involving the tibia or fibula. Moderate fragmentation and secondary degenerative changes of the midfoot/hindfoot region. There is no acute fracture or dislocation. No definite focal bone destruction to suggest osteomyelitis. Ligaments Suboptimally assessed by CT. Muscles and Tendons There is thickening with a rim of fluid adjacent the distal flexor hallucis longus tendon at and above the level of  the  ankle mortise. Remaining muscles/tendons are unremarkable. Soft tissues Moderate diffuse subcutaneous edema over the ankle and foot. There is an oval rim enhancing fluid collection anterolateral and slightly inferior to the talus measuring approximately 1.8 x 2.6 cm in transverse in AP dimension. IMPRESSION: Postsurgical changes over the left foot and ankle as described with hardware intact. Moderate posttraumatic/surgical fragmentation and degenerative changes of the midfoot/hindfoot. No acute fracture. Widening of the lateral aspect of the ankle mortise which may be acute or chronic. Rim enhancing fluid collection measuring approximately 1.8 x 2.6 cm anterolateral and slightly inferior to the talus. This may represent infected versus noninfected fluid collection. There is associated diffuse subcutaneous edema over the foot and ankle. Thickening with adjacent fluid involving the flexor hallucis longus tendon at and above the level of the ankle mortise which may indicate tendinosis or partial tear. Electronically Signed   By: Elberta Fortis M.D.   On: 08/24/2018 22:13   Ct Foot Left W Contrast  Result Date: 08/24/2018 CLINICAL DATA:  Left ankle pain and swelling with flu-like symptoms 1 week. History of previous left ankle surgery June 2019 redness and swelling. EXAM: CT OF THE LOWER LEFT EXTREMITY WITH CONTRAST TECHNIQUE: Multidetector CT imaging of the lower left extremity was performed according to the standard protocol following intravenous contrast administration. COMPARISON:  Plain films 08/24/2018 CONTRAST:  OMNIPAQUE IOHEXOL 300 MG/ML  SOLN FINDINGS: Bones/Joint/Cartilage There are 2 orthopedic screws extending from posterior to anterior through the calcaneus into the talus intact. There is surgical ankylosis with bone stable over the dorsal talonavicular region. There is lateral plate with screws over the calcaneocuboid joint intact. There is moderate widening of the lateral aspect of the  ankle mortise. No acute fracture involving the tibia or fibula. Moderate fragmentation and secondary degenerative changes of the midfoot/hindfoot region. There is no acute fracture or dislocation. No definite focal bone destruction to suggest osteomyelitis. Ligaments Suboptimally assessed by CT. Muscles and Tendons There is thickening with a rim of fluid adjacent the distal flexor hallucis longus tendon at and above the level of the ankle mortise. Remaining muscles/tendons are unremarkable. Soft tissues Moderate diffuse subcutaneous edema over the ankle and foot. There is an oval rim enhancing fluid collection anterolateral and slightly inferior to the talus measuring approximately 1.8 x 2.6 cm in transverse in AP dimension. IMPRESSION: Postsurgical changes over the left foot and ankle as described with hardware intact. Moderate posttraumatic/surgical fragmentation and degenerative changes of the midfoot/hindfoot. No acute fracture. Widening of the lateral aspect of the ankle mortise which may be acute or chronic. Rim enhancing fluid collection measuring approximately 1.8 x 2.6 cm anterolateral and slightly inferior to the talus. This may represent infected versus noninfected fluid collection. There is associated diffuse subcutaneous edema over the foot and ankle. Thickening with adjacent fluid involving the flexor hallucis longus tendon at and above the level of the ankle mortise which may indicate tendinosis or partial tear. Electronically Signed   By: Elberta Fortis M.D.   On: 08/24/2018 22:13    Procedures Procedures (including critical care time)  Medications Ordered in ED Medications  fentaNYL (SUBLIMAZE) injection 50 mcg (50 mcg Intravenous Given 08/24/18 2015)  sodium chloride 0.9 % bolus 1,000 mL (0 mLs Intravenous Stopped 08/24/18 2320)  iohexol (OMNIPAQUE) 300 MG/ML solution 100 mL (100 mLs Intravenous Contrast Given 08/24/18 2100)  HYDROmorphone (DILAUDID) injection 0.5 mg (0.5 mg Intravenous  Given 08/24/18 2215)     Initial Impression / Assessment and Plan / ED Course  I  have reviewed the triage vital signs and the nursing notes.  Pertinent labs & imaging results that were available during my care of the patient were reviewed by me and considered in my medical decision making (see chart for details).     Patient seen and examined.  Plain films show swelling but no soft tissue gas.  High suspicion for septic joint and or soft tissue infection related to her previous surgery.  Inflammatory markers, CT imaging ordered to further define infection.  Normal lactic acid.  Patient afebrile here.  Vital signs reviewed and are as follows: BP 111/65 (BP Location: Left Arm)   Pulse (!) 102   Temp 98.6 F (37 C) (Oral)   Resp 16   SpO2 95%   CT imaging reviewed. Patient discussed with Dr. Lockie Mola who has seen patient.   1:02 AM Multiple attempts made to contact [facility]. Initially I talked with someone named Marchelle Folks who works with Dr. Eilleen Kempf but she was unable to accept and asked me to call on call orthopedic attending. I have spoken with transfer line.  Reportedly they are unable to contact physician who can accept transfer and we are continuing to try to make contact.  Patient updated.  I will allow her to drink.  Pain is currently controlled.  Final Clinical Impressions(s) / ED Diagnoses   Final diagnoses:  Foot abscess, left  Cellulitis of foot, left  Hypokalemia   Patient with abscess related to recent surgical site and hardware. Awaiting contact with [facility] for reccs and possible transfer.    ED Discharge Orders    None       Renne Crigler, PA-C 08/25/18 0105    Virgina Norfolk, DO 08/26/18 [phone]

## 2018-08-24 NOTE — ED Triage Notes (Signed)
Pt presents to ED for assessment of left ankle swelling, pain, and flu-like symptoms x 1 week.  Hx of surgery of left ankle in June with hardware in place.  Redness, swelling and heat noted.

## 2018-08-24 NOTE — ED Notes (Signed)
Pt transported to CT ?

## 2018-08-24 NOTE — ED Provider Notes (Signed)
Medical screening examination/treatment/procedure(s) were conducted as a shared visit with non-physician practitioner(s) and myself.  I personally evaluated the patient during the encounter. Briefly, the patient is a 62 y.o. female with history of DVT, low back pain, recent left ankle/foot surgery who presents to the ED with left ankle/foot pain.  Patient with tachycardia but otherwise unremarkable vitals.  Patient with pain and swelling and redness to the left foot and ankle area for the last 24 hours.  Patient has had some drainage from the left side of the leg.  Denies any trauma.  Had surgery at [facility].  Has recently been ambulating on it since the surgery over the past week.  Patient has a red and warm foot on exam.  Concern for cellulitis versus abscess.  Patient has good range of motion at her ankle without much pain have low concern for septic arthritis.  Lab work was obtained that shows elevated white count, elevated sed rate.  X-ray of the foot and CT scans of the foot and ankle show rim-enhancing fluid collection in about 1.8 to 2.6 cm.  Likely an infectious fluid process given physical exam.  There is no acute fracture.  Concern for cellulitis and an abscess. Less likely septic joint.  Physician assistant will attempt to contact [facility] to see further recommendations.  Will likely benefit from transfer to her primary orthopedics.  Patient was given IV fluids, IV pain medicine and will await abx recs per ortho.   This chart was dictated using voice recognition software.  Despite best efforts to proofread,  errors can occur which can change the documentation meaning.    EKG Interpretation None           Virgina Norfolk, DO 08/25/18 0024

## 2018-08-25 DIAGNOSIS — L03116 Cellulitis of left lower limb: Secondary | ICD-10-CM | POA: Diagnosis not present

## 2018-08-25 DIAGNOSIS — M65072 Abscess of tendon sheath, left ankle and foot: Secondary | ICD-10-CM | POA: Diagnosis not present

## 2018-08-25 DIAGNOSIS — Z7901 Long term (current) use of anticoagulants: Secondary | ICD-10-CM | POA: Diagnosis not present

## 2018-08-25 DIAGNOSIS — E876 Hypokalemia: Secondary | ICD-10-CM | POA: Diagnosis not present

## 2018-08-25 DIAGNOSIS — I10 Essential (primary) hypertension: Secondary | ICD-10-CM | POA: Diagnosis not present

## 2018-08-25 DIAGNOSIS — Z79899 Other long term (current) drug therapy: Secondary | ICD-10-CM | POA: Diagnosis not present

## 2018-08-25 DIAGNOSIS — M25572 Pain in left ankle and joints of left foot: Secondary | ICD-10-CM | POA: Diagnosis present

## 2018-08-25 DIAGNOSIS — Z87891 Personal history of nicotine dependence: Secondary | ICD-10-CM | POA: Diagnosis not present

## 2018-08-25 MED ORDER — METOPROLOL TARTRATE 25 MG PO TABS
12.5000 mg | ORAL_TABLET | Freq: Once | ORAL | Status: AC
  Administered 2018-08-25: 12.5 mg via ORAL
  Filled 2018-08-25: qty 1

## 2018-08-25 MED ORDER — HYDROMORPHONE HCL 1 MG/ML IJ SOLN
1.0000 mg | Freq: Once | INTRAMUSCULAR | Status: AC
  Administered 2018-08-25: 1 mg via INTRAVENOUS
  Filled 2018-08-25: qty 1

## 2018-08-25 NOTE — ED Notes (Signed)
Called [facility] again

## 2018-08-25 NOTE — ED Notes (Signed)
Transfer line is Pensions consultant about this issue.  Will call back with an update.

## 2018-08-25 NOTE — ED Notes (Signed)
Requested disk of CTs for transport

## 2018-08-25 NOTE — ED Notes (Addendum)
Report given to [facility] and [facility] ED CN. EMTALA completed and confirmed by Tori, CN. Transfer consent obtained and pt assisted to stretcher. All paperwork and CD of imaging given to [facility] crew. Pt stable and in NAD.

## 2018-08-25 NOTE — ED Provider Notes (Signed)
6/25, foot fusion, Dr. Eilleen Kempf at [facility] fro post-op f/u 1 week of pain around surgical site Subjective fever, "fluish", foot swelling Now with blister, purulent drainage (away from surgical incision)  CT results today - "fluid collection along the bone with infection" Leukocytosis Sed rate 99  Delay due to no ortho contact with [facility] x 2 hours - continuing to try  2:35 - Contact made with [facility] ER, Dr. Felecia Jan, as we are still unable to contact orthopedic service. The patient has been accepted for transfer to the ED by Dr. Felecia Jan. Appreciate his help with the care of this patient.    Abigail Anis, PA-C 08/25/18 0236    Abigail Norfolk, DO 08/26/18 [phone]

## 2018-08-25 NOTE — ED Notes (Signed)
repaged [facility] transfer line for 3rd or 4th time.  Waiting for ortho to call us back.

## 2018-08-25 NOTE — ED Notes (Signed)
Pt requesting update on situation. PA notified.

## 2018-08-29 LAB — CULTURE, BLOOD (ROUTINE X 2)
Culture: NO GROWTH
Culture: NO GROWTH
Special Requests: ADEQUATE
Special Requests: ADEQUATE

## 2018-09-01 ENCOUNTER — Ambulatory Visit (INDEPENDENT_AMBULATORY_CARE_PROVIDER_SITE_OTHER): Payer: 59 | Admitting: Family Medicine

## 2018-09-18 ENCOUNTER — Other Ambulatory Visit (HOSPITAL_COMMUNITY): Payer: Self-pay | Admitting: Nurse Practitioner

## 2018-09-20 ENCOUNTER — Other Ambulatory Visit (HOSPITAL_COMMUNITY): Payer: Self-pay | Admitting: *Deleted

## 2019-05-11 ENCOUNTER — Other Ambulatory Visit (HOSPITAL_COMMUNITY): Payer: Self-pay | Admitting: Nurse Practitioner

## 2019-07-09 ENCOUNTER — Other Ambulatory Visit (HOSPITAL_COMMUNITY): Payer: Self-pay | Admitting: Nurse Practitioner

## 2019-08-10 ENCOUNTER — Other Ambulatory Visit (HOSPITAL_COMMUNITY): Payer: Self-pay | Admitting: Nurse Practitioner

## 2019-08-11 ENCOUNTER — Encounter (INDEPENDENT_AMBULATORY_CARE_PROVIDER_SITE_OTHER): Payer: Self-pay | Admitting: Bariatrics

## 2019-08-11 ENCOUNTER — Other Ambulatory Visit: Payer: Self-pay

## 2019-08-11 ENCOUNTER — Ambulatory Visit (INDEPENDENT_AMBULATORY_CARE_PROVIDER_SITE_OTHER): Payer: Medicare Other | Admitting: Bariatrics

## 2019-08-11 VITALS — BP 136/81 | HR 88 | Temp 98.7°F | Ht 63.0 in | Wt 332.0 lb

## 2019-08-11 DIAGNOSIS — M19072 Primary osteoarthritis, left ankle and foot: Secondary | ICD-10-CM | POA: Diagnosis not present

## 2019-08-11 DIAGNOSIS — I480 Paroxysmal atrial fibrillation: Secondary | ICD-10-CM | POA: Diagnosis not present

## 2019-08-11 DIAGNOSIS — F3289 Other specified depressive episodes: Secondary | ICD-10-CM | POA: Diagnosis not present

## 2019-08-11 DIAGNOSIS — E559 Vitamin D deficiency, unspecified: Secondary | ICD-10-CM | POA: Diagnosis not present

## 2019-08-11 DIAGNOSIS — I10 Essential (primary) hypertension: Secondary | ICD-10-CM

## 2019-08-11 DIAGNOSIS — Z9189 Other specified personal risk factors, not elsewhere classified: Secondary | ICD-10-CM

## 2019-08-11 DIAGNOSIS — Z6841 Body Mass Index (BMI) 40.0 and over, adult: Secondary | ICD-10-CM

## 2019-08-12 LAB — VITAMIN D 25 HYDROXY (VIT D DEFICIENCY, FRACTURES): Vit D, 25-Hydroxy: 42.7 ng/mL (ref 30.0–100.0)

## 2019-08-12 LAB — COMPREHENSIVE METABOLIC PANEL
ALT: 9 IU/L (ref 0–32)
AST: 13 IU/L (ref 0–40)
Albumin/Globulin Ratio: 1.7 (ref 1.2–2.2)
Albumin: 4.7 g/dL (ref 3.8–4.8)
Alkaline Phosphatase: 80 IU/L (ref 39–117)
BUN/Creatinine Ratio: 38 — ABNORMAL HIGH (ref 12–28)
BUN: 33 mg/dL — ABNORMAL HIGH (ref 8–27)
Bilirubin Total: 0.3 mg/dL (ref 0.0–1.2)
CO2: 19 mmol/L — ABNORMAL LOW (ref 20–29)
Calcium: 9.7 mg/dL (ref 8.7–10.3)
Chloride: 103 mmol/L (ref 96–106)
Creatinine, Ser: 0.88 mg/dL (ref 0.57–1.00)
GFR calc Af Amer: 82 mL/min/{1.73_m2} (ref >59)
GFR calc non Af Amer: 71 mL/min/{1.73_m2} (ref >59)
Globulin, Total: 2.7 g/dL (ref 1.5–4.5)
Glucose: 89 mg/dL (ref 65–99)
Potassium: 4.6 mmol/L (ref 3.5–5.2)
Sodium: 137 mmol/L (ref 134–144)
Total Protein: 7.4 g/dL (ref 6.0–8.5)

## 2019-08-12 LAB — T3: T3, Total: 112 ng/dL (ref 71–180)

## 2019-08-12 LAB — HEMOGLOBIN A1C
Est. average glucose Bld gHb Est-mCnc: 120 mg/dL
Hgb A1c MFr Bld: 5.8 % — ABNORMAL HIGH (ref 4.8–5.6)

## 2019-08-12 LAB — T4, FREE: Free T4: 1.65 ng/dL (ref 0.82–1.77)

## 2019-08-12 LAB — TSH: TSH: 3.71 u[IU]/mL (ref 0.450–4.500)

## 2019-08-12 LAB — INSULIN, RANDOM: INSULIN: 14.4 u[IU]/mL (ref 2.6–24.9)

## 2019-08-15 ENCOUNTER — Encounter (INDEPENDENT_AMBULATORY_CARE_PROVIDER_SITE_OTHER): Payer: Self-pay | Admitting: Bariatrics

## 2019-08-15 DIAGNOSIS — R7303 Prediabetes: Secondary | ICD-10-CM | POA: Insufficient documentation

## 2019-08-16 ENCOUNTER — Encounter (INDEPENDENT_AMBULATORY_CARE_PROVIDER_SITE_OTHER): Payer: Self-pay | Admitting: Bariatrics

## 2019-08-16 NOTE — Progress Notes (Signed)
Office: [phone]  /  Fax: [phone]   HPI:   Chief Complaint: OBESITY [name] is here to discuss her progress with her obesity treatment plan. She is currently not following an eating plan and is exercising 0 minutes 0 times per week. [name] is a former patient of Dr. Dalbert Garnet. Her last visit with Dr. Dalbert Garnet was 08/11/2018 and she has gained 63 lbs since that visit.  Her weight is (!) 332 lb (150.6 kg) today and has had a weight gain of 63 lbs since her last visit. She has lost 0 lbs since starting treatment with [place].  Vitamin D deficiency [name] has a diagnosis of Vitamin D deficiency. She is currently taking OTC Vit D and denies nausea, vomiting or muscle weakness.  At risk for osteopenia and osteoporosis [name] is at higher risk of osteopenia and osteoporosis due to Vitamin D deficiency.   Hypertension [name] is a 63 y.o. female with hypertension.  [name] denies chest pain or shortness of breath on exertion. She is working weight loss to help control her blood pressure with the goal of decreasing her risk of heart attack and stroke. [name]'s blood pressure is well controlled.  Depression, Other [name] is struggling with emotional eating and using food for comfort to the extent that it is negatively impacting her health. She often snacks when she is not hungry. Lemon sometimes feels she is out of control and then feels guilty that she made poor food choices. She has been working on behavior modification techniques to help reduce her emotional eating and has been somewhat successful. [name] had been on Wellbutrin, but currently is taking Cymbalta. She shows no sign of suicidal or homicidal ideations.  Depression screen PHQ 2/9 02/08/2018  Decreased Interest 3  Down, Depressed, Hopeless 3  PHQ - 2 Score 6  Altered sleeping 1  Tired, decreased energy 3  Change in appetite 2  Feeling bad or failure about yourself  3  Trouble concentrating 2  Moving slowly  or fidgety/restless 3  Suicidal thoughts 0  PHQ-9 Score 20  Difficult doing work/chores Very difficult   Osteoarthritis, Left Midfoot [name] has been immobile. She saw an orthopedic surgeon at [facility].  Paroxysmal Atrial Fibrillation [name] is currently taking two 81 mg ASA, Xarelto, and metoprolol.  ASSESSMENT AND PLAN:  Vitamin D deficiency - Plan: VITAMIN D 25 Hydroxy (Vit-D Deficiency, Fractures)  Essential hypertension - Plan: Comprehensive metabolic panel, Hemoglobin A1c, Insulin, random, T3, T4, free, TSH  Paroxysmal A-fib (HCC)  Osteoarthritis of left foot, unspecified osteoarthritis type  Other depression - with emotional eating   At risk for osteoporosis  Class 3 severe obesity with serious comorbidity and body mass index (BMI) of 50.0 to 59.9 in adult, unspecified obesity type (HCC)  PLAN:  Vitamin D Deficiency [name] was informed that low Vitamin D levels contributes to fatigue and are associated with obesity, breast, and colon cancer. She will have routine testing of Vitamin D and follow-up with our clinic in 2 weeks.  At risk for osteopenia and osteoporosis [name] was given extended  (15 minutes) osteoporosis prevention counseling today. [name] is at risk for osteopenia and osteoporosis due to her Vitamin D deficiency. She was encouraged to take her Vitamin D and follow her higher calcium diet and increase strengthening exercise to help strengthen her bones and decrease her risk of osteopenia and osteoporosis.  Hypertension We discussed sodium restriction, working on healthy weight loss, and a regular exercise program as the  means to achieve improved blood pressure control. [name] agreed with this plan and agreed to follow up as directed. We will continue to monitor her blood pressure as well as her progress with the above lifestyle modifications. [name] will have CMP, FLP, A1c and insulin checked today. She will continue her medications as  prescribed and will watch for signs of hypotension as she continues her lifestyle modifications.  Depression, Other We discussed behavior modification techniques today to help [name] deal with her emotional eating and depression. [name] will continue taking Cymbalta. May resume Wellbutrin in the future.  Osteoarthritis, Left Midfoot [name] She will continue using a walker and cast on left foot.  Paroxysmal Atrial Fibrillation [name] will follow-up with her cardiologist and continue her current medications as prescribed.  Obesity [name] is currently in the action stage of change. As such, her goal is to continue with weight loss efforts. She has agreed to follow the Category 3 plan. [name] will work on meal planning and intentional eating. She will come to the office 15 minutes early at her next appointment for IC testing. [name] has been instructed to work up to a goal of 150 minutes of combined cardio and strengthening exercise per week for weight loss and overall health benefits. We discussed the following Behavioral Modification Strategies today: increasing lean protein intake, decreasing simple carbohydrates, increasing vegetables, increase H20 intake, decrease eating out, no skipping meals, work on meal planning and easy cooking plans, and keeping healthy foods in the home.  [name] has agreed to follow-up with our clinic in 2 weeks. She was informed of the importance of frequent follow-up visits to maximize her success with intensive lifestyle modifications for her multiple health conditions.  ALLERGIES: Allergies  Allergen Reactions  . Penicillins Hives, Shortness Of Breath and Other (See Comments)    Has patient had a PCN reaction causing immediate rash, facial/tongue/throat swelling, SOB or lightheadedness with hypotension: Yes Has patient had a PCN reaction causing severe rash involving mucus membranes or skin necrosis: No  Has patient had a PCN reaction that required hospitalization: No Has patient had a PCN reaction occurring within the last 10 years: No If all of the above answers are "NO", then may proceed with Cephalosporin use.   . Codeine Hives    MEDICATIONS: Current Outpatient Medications on File Prior to Visit  Medication Sig Dispense Refill  . Ascorbic Acid (VITAMIN C) 1000 MG tablet Take 1,000 mg by mouth daily.    Marland Kitchen. buPROPion (WELLBUTRIN SR) 200 MG 12 hr tablet Take 1 tablet (200 mg total) by mouth daily. 30 tablet 0  . DULoxetine (CYMBALTA) 30 MG capsule Take 90 mg by mouth daily.    Marland Kitchen. losartan-hydrochlorothiazide (HYZAAR) 100-25 MG tablet Take 1 tablet by mouth daily.    . metoprolol tartrate (LOPRESSOR) 25 MG tablet TAKE 1/2 TABLET BY MOUTH TWICE DAILY 30 tablet 0  . pantoprazole (PROTONIX) 40 MG tablet Take 1 tablet (40 mg total) by mouth daily. 30 tablet 1  . traMADol (ULTRAM) 50 MG tablet Take 50 mg by mouth 3 (three) times daily as needed for moderate pain.   0  . Vitamin D, Ergocalciferol, (DRISDOL) 50000 units CAPS capsule TAKE 1 CAPSULE BY MOUTH EVERY 7 DAYS 4 capsule 0  . XARELTO 20 MG TABS tablet Take 20 mg by mouth daily with supper.      No current facility-administered medications on file prior to visit.     PAST MEDICAL HISTORY: Past  Medical History:  Diagnosis Date  . Atrial fibrillation (HCC)   . Chronic lower back pain   . Depression   . DVT (deep venous thrombosis) (HCC) 1990s   LLE  . Fallen arches   . Hypertension   . Hyperthyroidism ~ 2000   "fine now" (04/23/2016)  . Joint pain   . Lactose intolerance   . Leg edema   . Obesity   . Pneumonia 1980s X 1  . Snoring    has not had sleep study but suspects sleep apnea    PAST SURGICAL HISTORY: Past Surgical History:  Procedure Laterality Date  . CARDIOVERSION N/A 06/18/2016   Procedure: CARDIOVERSION;  Surgeon: Laurey Morale, MD;  Location: [facility] ENDOSCOPY;  Service: Cardiovascular;  Laterality: N/A;  .  CARDIOVERSION N/A 07/11/2016   Procedure: CARDIOVERSION;  Surgeon: Lewayne Bunting, MD;  Location: [facility] ENDOSCOPY;  Service: Cardiovascular;  Laterality: N/A;  . CARDIOVERSION N/A 07/02/2017   Procedure: CARDIOVERSION;  Surgeon: Thurmon Fair, MD;  Location: [facility] ENDOSCOPY;  Service: Cardiovascular;  Laterality: N/A;  . CARDIOVERSION N/A 09/29/2017   Procedure: CARDIOVERSION;  Surgeon: Laurey Morale, MD;  Location: [facility] ENDOSCOPY;  Service: Cardiovascular;  Laterality: N/A;  . ENDOMETRIAL ABLATION  ~2006  . TONSILLECTOMY  1960s  . TUBAL LIGATION  1979    SOCIAL HISTORY: Social History   Tobacco Use  . Smoking status: Former Smoker    Packs/day: 0.75    Years: 10.00    Pack years: 7.50    Types: Cigarettes  . Smokeless tobacco: Never Used  . Tobacco comment: 04/23/2016 "stopped smoking cigarettes in ~ 1989"  Substance Use Topics  . Alcohol use: Yes    Alcohol/week: 4.0 standard drinks    Types: 4 Glasses of wine per week    Comment: recently quit  . Drug use: Yes    Types: Marijuana    Comment: 04/23/2016 "recreational marijuana in my late teens/early 20's"    FAMILY HISTORY: Family History  Problem Relation Age of Onset  . Diabetes Mellitus I Father   . CVA Father   . Arthritis Mother   . Obesity Mother   . Thyroid disease Mother   . Liver disease Mother   . CAD Maternal Grandfather    ROS: Review of Systems  Respiratory: Negative for shortness of breath.   Cardiovascular: Negative for chest pain.       Positive for paroxysmal atrial fibrillation.  Gastrointestinal: Negative for nausea and vomiting.  Musculoskeletal:       Negative for muscle weakness. Positive for osteoarthritis, left midfoot.  Psychiatric/Behavioral: Positive for depression. Negative for suicidal ideas.       Negative for homicidal ideas.   PHYSICAL EXAM: Blood pressure 136/81, pulse 88, temperature 98.7 F (37.1 C), temperature source Oral, height  (1.6 m), weight (!) 332 lb (150.6 kg), SpO2  93 %. Body mass index is 58.81 kg/m. Physical Exam Vitals signs reviewed.  Constitutional:      Appearance: Normal appearance. She is obese.  Cardiovascular:     Rate and Rhythm: Normal rate.     Pulses: Normal pulses.  Pulmonary:     Effort: Pulmonary effort is normal.     Breath sounds: Normal breath sounds.  Musculoskeletal: Normal range of motion.  Skin:    General: Skin is warm and dry.  Neurological:     Mental Status: She is alert and oriented to person, place, and time.     Comments: Using a walker and has a boot on  her left foot.  Psychiatric:        Behavior: Behavior normal.   RECENT LABS AND TESTS: BMET    Component Value Date/Time   NA 137 08/11/2019 1320   K 4.6 08/11/2019 1320   CL 103 08/11/2019 1320   CO2 19 (L) 08/11/2019 1320   GLUCOSE 89 08/11/2019 1320   GLUCOSE 97 08/24/2018 1457   BUN 33 (H) 08/11/2019 1320   CREATININE 0.88 08/11/2019 1320   CREATININE 0.66 06/13/2016 1432   CALCIUM 9.7 08/11/2019 1320   GFRNONAA 71 08/11/2019 1320   GFRAA 82 08/11/2019 1320   Lab Results  Component Value Date   HGBA1C 5.8 (H) 08/11/2019   HGBA1C 5.5 06/30/2018   HGBA1C 5.7 (H) 02/08/2018   Lab Results  Component Value Date   INSULIN 14.4 08/11/2019   INSULIN 4.8 06/30/2018   INSULIN 5.8 02/08/2018   CBC    Component Value Date/Time   WBC 11.2 (H) 08/24/2018 1457   RBC 4.76 08/24/2018 1457   HGB 12.9 08/24/2018 1457   HGB 14.4 02/08/2018 1011   HCT 40.3 08/24/2018 1457   HCT 41.8 02/08/2018 1011   PLT 471 (H) 08/24/2018 1457   PLT 254 02/08/2018 1011   MCV 84.7 08/24/2018 1457   MCV 80 02/08/2018 1011   MCH 27.1 08/24/2018 1457   MCHC 32.0 08/24/2018 1457   RDW 14.0 08/24/2018 1457   RDW 14.2 02/08/2018 1011   LYMPHSABS 1.4 08/24/2018 1457   LYMPHSABS 1.3 02/08/2018 1011   MONOABS 1.9 (H) 08/24/2018 1457   EOSABS 0.1 08/24/2018 1457   EOSABS 0.2 02/08/2018 1011   BASOSABS 0.1 08/24/2018 1457   BASOSABS 0.0 02/08/2018 1011    Iron/TIBC/Ferritin/ %Sat No results found for: IRON, TIBC, FERRITIN, IRONPCTSAT Lipid Panel     Component Value Date/Time   CHOL 166 02/08/2018 1011   TRIG 75 02/08/2018 1011   HDL 75 02/08/2018 1011   CHOLHDL 2.2 02/08/2018 1011   LDLCALC 76 02/08/2018 1011   Hepatic Function Panel     Component Value Date/Time   PROT 7.4 08/11/2019 1320   ALBUMIN 4.7 08/11/2019 1320   AST 13 08/11/2019 1320   ALT 9 08/11/2019 1320   ALKPHOS 80 08/11/2019 1320   BILITOT 0.3 08/11/2019 1320      Component Value Date/Time   TSH 3.710 08/11/2019 1320   TSH 3.570 06/30/2018 1357   TSH 4.800 (H) 02/08/2018 1011   Results for DEANDRE, STANSEL (MRN [id number]) as of 08/16/2019 12:52  Ref. Range 08/24/2018 20:24  Lactic Acid, Venous Latest Ref Range: 0.5 - 1.9 mmol/L 1.14   OBESITY BEHAVIORAL INTERVENTION VISIT  Today's visit was #10  Starting weight: 296 lbs Starting date: 02/08/2018 Today's weight: 332 lbs  Today's date: 08/11/2019 Total lbs lost to date: 0    08/11/2019  Height  (1.6 m)  Weight 332 lb (150.6 kg) (A)  BMI (Calculated) 58.83  BLOOD PRESSURE - SYSTOLIC 136  BLOOD PRESSURE - DIASTOLIC 81   ASK: We discussed the diagnosis of obesity with Dale [place] today and Tracie agreed to give [place] permission to discuss obesity behavioral modification therapy today.  ASSESS: Ellionna has the diagnosis of obesity and her BMI today is 58.81. Yurika is in the action stage of change.   ADVISE: Leigh was educated on the multiple health risks of obesity as well as the benefit of weight loss to improve her health. She was advised of the need for long term treatment and the importance of  lifestyle modifications to improve her current health and to decrease her risk of future health problems.  AGREE: Multiple dietary modification options and treatment options were discussed and  Laloni agreed to follow the recommendations documented in the above note.  ARRANGE: Foy was  educated on the importance of frequent visits to treat obesity as outlined per CMS and USPSTF guidelines and agreed to schedule her next follow up appointment today.  Migdalia Dk, am acting as Location manager for [employer], DO  I have reviewed the above documentation for accuracy and completeness, and I agree with the above. -Jearld Lesch, DO

## 2019-08-18 ENCOUNTER — Encounter (INDEPENDENT_AMBULATORY_CARE_PROVIDER_SITE_OTHER): Payer: Self-pay | Admitting: Bariatrics

## 2019-08-18 NOTE — Telephone Encounter (Signed)
Please advise 

## 2019-08-31 ENCOUNTER — Other Ambulatory Visit: Payer: Self-pay

## 2019-08-31 ENCOUNTER — Ambulatory Visit (INDEPENDENT_AMBULATORY_CARE_PROVIDER_SITE_OTHER): Payer: Medicare Other | Admitting: Family Medicine

## 2019-08-31 ENCOUNTER — Encounter: Payer: Self-pay | Admitting: Family Medicine

## 2019-08-31 ENCOUNTER — Encounter (INDEPENDENT_AMBULATORY_CARE_PROVIDER_SITE_OTHER): Payer: Self-pay | Admitting: Family Medicine

## 2019-08-31 VITALS — BP 119/82 | HR 83 | Temp 98.4°F | Ht 63.0 in | Wt 338.0 lb

## 2019-08-31 DIAGNOSIS — Z6841 Body Mass Index (BMI) 40.0 and over, adult: Secondary | ICD-10-CM | POA: Diagnosis not present

## 2019-08-31 DIAGNOSIS — R7303 Prediabetes: Secondary | ICD-10-CM

## 2019-08-31 DIAGNOSIS — R0602 Shortness of breath: Secondary | ICD-10-CM

## 2019-08-31 DIAGNOSIS — E559 Vitamin D deficiency, unspecified: Secondary | ICD-10-CM | POA: Diagnosis not present

## 2019-08-31 MED ORDER — VITAMIN D (ERGOCALCIFEROL) 1.25 MG (50000 UNIT) PO CAPS: 50000.0000 [IU] | ORAL_CAPSULE | ORAL | 0 refills | Status: DC

## 2019-09-01 ENCOUNTER — Encounter (INDEPENDENT_AMBULATORY_CARE_PROVIDER_SITE_OTHER): Payer: Self-pay | Admitting: Family Medicine

## 2019-09-01 DIAGNOSIS — E559 Vitamin D deficiency, unspecified: Secondary | ICD-10-CM | POA: Insufficient documentation

## 2019-09-01 NOTE — Progress Notes (Signed)
Office: [phone]  /  Fax: [phone]   HPI:   Chief Complaint: OBESITY [name] is here to discuss her progress with her obesity treatment plan. She is journaling 1500 calories and she is following her eating plan approximately 90 % of the time. She states she is exercising 0 minutes 0 times per week. [name]'s initial office visit was 02/08/18 and she lost 27 pounds over the course of six months (296 lbs down to 269 lbs). She then did not follow up again until 08/11/18 and she had gained 63 pounds. She reports she stopped coming to our clinic due to serious health issues she was experiencing. She had osteomyelitis in her shoulder and left foot and has had several surgeries and hospitalizations related to this. [name] has been skipping meals due to lack of appetite because she is upset over her recent diagnosis of poor circulation to her left foot. She may possibly lose her foot. She has an upcoming surgery planned 09/06/19 to work on her circulation problem.  Indirect calorimetry today shows an increase in her RMR to 2372 from 1850 (02/08/18).  Her weight is (!) 338 lb (153.3 kg) today and has had a weight gain of 6 pounds over a period of 3 weeks since her last visit. She has gained 42 lbs since starting treatment with [place].  Shortness of Breath with Exertion [name] reports increased shortness of breath with exertion and is worsening with weight gain of 63 pounds over the past year.   Pre-Diabetes [name] has a diagnosis of prediabetes based on her elevated Hgb A1c and was informed this puts her at greater risk of developing diabetes. Her last A1c was at 5.8 on 08/11/19. She is not on metformin and she continues to work on diet and exercise to decrease risk of diabetes. She denies nausea or hypoglycemia.  ASSESSMENT AND PLAN:  Vitamin D deficiency - Plan: Vitamin D, Ergocalciferol, (DRISDOL) 1.25 MG (50000 UT) CAPS capsule  Shortness of breath on exertion  Prediabetes  Class 3 severe  obesity with serious comorbidity and body mass index (BMI) of 50.0 to 59.9 in adult, unspecified obesity type (HCC)  PLAN:  Shortness of Breath with Exertion [name] shortness of breath appears to be obesity related and exercise induced. Indirect calorimetry was ordered today and the indirect calorimeter results showed VO2 of 341 and a REE of 2372. She will work on weight loss and gradually increase exercise to treat her exercise induced shortness of breath.  Pre-Diabetes [name] will continue to work on weight loss, exercise, and decreasing simple carbohydrates in her diet to help decrease the risk of diabetes. [name] will continue with the meal plan and she follow up with [place] as directed to monitor her progress.  Obesity [name] is currently in the action stage of change. As such, her goal is to continue with weight loss efforts She has agreed to follow the Category 3 plan +100 calories We discussed the following Behavioral Modification Strategies today: increasing lean protein intake, decreasing simple carbohydrates  and decrease eating out   [name] has agreed to follow up with our clinic in 2 to 3 weeks. She was informed of the importance of frequent follow up visits to maximize her success with intensive lifestyle modifications for her multiple health conditions.  ALLERGIES: Allergies  Allergen Reactions  . Penicillins Hives, Shortness Of Breath and Other (See Comments)    Has patient had a PCN reaction causing immediate rash, facial/tongue/throat swelling, SOB or lightheadedness with hypotension: Yes Has patient  had a PCN reaction causing severe rash involving mucus membranes or skin necrosis: No Has patient had a PCN reaction that required hospitalization: No Has patient had a PCN reaction occurring within the last 10 years: No If all of the above answers are "NO", then may proceed with Cephalosporin use.   . Codeine Hives    MEDICATIONS: Current Outpatient Medications on File  Prior to Visit  Medication Sig Dispense Refill  . Ascorbic Acid (VITAMIN C) 1000 MG tablet Take 1,000 mg by mouth daily.    Marland Kitchen. buPROPion (WELLBUTRIN SR) 200 MG 12 hr tablet Take 1 tablet (200 mg total) by mouth daily. 30 tablet 0  . DULoxetine (CYMBALTA) 30 MG capsule Take 90 mg by mouth daily.    Marland Kitchen. losartan-hydrochlorothiazide (HYZAAR) 100-25 MG tablet Take 1 tablet by mouth daily.    . metoprolol tartrate (LOPRESSOR) 25 MG tablet TAKE 1/2 TABLET BY MOUTH TWICE DAILY 30 tablet 0  . pantoprazole (PROTONIX) 40 MG tablet Take 1 tablet (40 mg total) by mouth daily. 30 tablet 1  . traMADol (ULTRAM) 50 MG tablet Take 50 mg by mouth 3 (three) times daily as needed for moderate pain.   0  . XARELTO 20 MG TABS tablet Take 20 mg by mouth daily with supper.      No current facility-administered medications on file prior to visit.     PAST MEDICAL HISTORY: Past Medical History:  Diagnosis Date  . Atrial fibrillation (HCC)   . Chronic lower back pain   . Depression   . DVT (deep venous thrombosis) (HCC) 1990s   LLE  . Fallen arches   . Hypertension   . Hyperthyroidism ~ 2000   "fine now" (04/23/2016)  . Joint pain   . Lactose intolerance   . Leg edema   . Obesity   . Pneumonia 1980s X 1  . Snoring    has not had sleep study but suspects sleep apnea    PAST SURGICAL HISTORY: Past Surgical History:  Procedure Laterality Date  . CARDIOVERSION N/A 06/18/2016   Procedure: CARDIOVERSION;  Surgeon: Laurey Moralealton S McLean, MD;  [facility];  Service: Cardiovascular;  Laterality: N/A;  . CARDIOVERSION N/A 07/11/2016   Procedure: CARDIOVERSION;  Surgeon: Lewayne BuntingBrian S Crenshaw, MD;  [facility];  Service: Cardiovascular;  Laterality: N/A;  . CARDIOVERSION N/A 07/02/2017   Procedure: CARDIOVERSION;  Surgeon: Thurmon Fairroitoru, Mihai, MD;  Location: [facility] ENDOSCOPY;  Service: Cardiovascular;  Laterality: N/A;  . CARDIOVERSION N/A 09/29/2017   Procedure: CARDIOVERSION;  Surgeon: Laurey MoraleMcLean, Dalton S, MD;   [facility];  Service: Cardiovascular;  Laterality: N/A;  . ENDOMETRIAL ABLATION  ~2006  . TONSILLECTOMY  1960s  . TUBAL LIGATION  1979    SOCIAL HISTORY: Social History   Tobacco Use  . Smoking status: Former Smoker    Packs/day: 0.75    Years: 10.00    Pack years: 7.50    Types: Cigarettes  . Smokeless tobacco: Never Used  . Tobacco comment: 04/23/2016 "stopped smoking cigarettes in ~ 1989"  Substance Use Topics  . Alcohol use: Yes    Alcohol/week: 4.0 standard drinks    Types: 4 Glasses of wine per week    Comment: recently quit  . Drug use: Yes    Types: Marijuana    Comment: 04/23/2016 "recreational marijuana in my late teens/early 20's"    FAMILY HISTORY: Family History  Problem Relation Age of Onset  . Diabetes Mellitus I Father   . CVA Father   . Arthritis  Mother   . Obesity Mother   . Thyroid disease Mother   . Liver disease Mother   . CAD Maternal Grandfather     ROS: Review of Systems  Constitutional: Negative for weight loss.  Respiratory: Positive for shortness of breath (on exertion).   Gastrointestinal: Negative for nausea.  Endo/Heme/Allergies:       Negative for hypoglycemia    PHYSICAL EXAM: Blood pressure 119/82, pulse 83, temperature 98.4 F (36.9 C), temperature source Oral, height 5\' 3"  (1.6 m), weight (!) 338 lb (153.3 kg), SpO2 98 %. Body mass index is 59.87 kg/m. Physical Exam Vitals signs reviewed.  Constitutional:      Appearance: Normal appearance. She is well-developed. She is obese.  Cardiovascular:     Rate and Rhythm: Normal rate.  Pulmonary:     Effort: Pulmonary effort is normal.  Musculoskeletal: Normal range of motion.  Skin:    General: Skin is warm and dry.  Neurological:     Mental Status: She is alert and oriented to person, place, and time.     Comments: + Uses walker (in boot, left foot)   Psychiatric:        Mood and Affect: Mood normal.        Behavior: Behavior normal.     RECENT LABS AND  TESTS: BMET    Component Value Date/Time   NA 137 08/11/2019 1320   K 4.6 08/11/2019 1320   CL 103 08/11/2019 1320   CO2 19 (L) 08/11/2019 1320   GLUCOSE 89 08/11/2019 1320   GLUCOSE 97 08/24/2018 1457   BUN 33 (H) 08/11/2019 1320   CREATININE 0.88 08/11/2019 1320   CREATININE 0.66 06/13/2016 1432   CALCIUM 9.7 08/11/2019 1320   GFRNONAA 71 08/11/2019 1320   GFRAA 82 08/11/2019 1320   Lab Results  Component Value Date   HGBA1C 5.8 (H) 08/11/2019   HGBA1C 5.5 06/30/2018   HGBA1C 5.7 (H) 02/08/2018   Lab Results  Component Value Date   INSULIN 14.4 08/11/2019   INSULIN 4.8 06/30/2018   INSULIN 5.8 02/08/2018   CBC    Component Value Date/Time   WBC 11.2 (H) 08/24/2018 1457   RBC 4.76 08/24/2018 1457   HGB 12.9 08/24/2018 1457   HGB 14.4 02/08/2018 1011   HCT 40.3 08/24/2018 1457   HCT 41.8 02/08/2018 1011   PLT 471 (H) 08/24/2018 1457   PLT 254 02/08/2018 1011   MCV 84.7 08/24/2018 1457   MCV 80 02/08/2018 1011   MCH 27.1 08/24/2018 1457   MCHC 32.0 08/24/2018 1457   RDW 14.0 08/24/2018 1457   RDW 14.2 02/08/2018 1011   LYMPHSABS 1.4 08/24/2018 1457   LYMPHSABS 1.3 02/08/2018 1011   MONOABS 1.9 (H) 08/24/2018 1457   EOSABS 0.1 08/24/2018 1457   EOSABS 0.2 02/08/2018 1011   BASOSABS 0.1 08/24/2018 1457   BASOSABS 0.0 02/08/2018 1011   Iron/TIBC/Ferritin/ %Sat No results found for: IRON, TIBC, FERRITIN, IRONPCTSAT Lipid Panel     Component Value Date/Time   CHOL 166 02/08/2018 1011   TRIG 75 02/08/2018 1011   HDL 75 02/08/2018 1011   CHOLHDL 2.2 02/08/2018 1011   LDLCALC 76 02/08/2018 1011   Hepatic Function Panel     Component Value Date/Time   PROT 7.4 08/11/2019 1320   ALBUMIN 4.7 08/11/2019 1320   AST 13 08/11/2019 1320   ALT 9 08/11/2019 1320   ALKPHOS 80 08/11/2019 1320   BILITOT 0.3 08/11/2019 1320      Component Value Date/Time  TSH 3.710 08/11/2019 1320   TSH 3.570 06/30/2018 1357   TSH 4.800 (H) 02/08/2018 1011     Ref. Range  08/11/2019 13:20  Vitamin D, 25-Hydroxy Latest Ref Range: 30.0 - 100.0 ng/mL 42.7    OBESITY BEHAVIORAL INTERVENTION VISIT  Today's visit was # 11   Starting weight: 296 lbs Starting date: 02/08/2018 Today's weight :  338 lbs Today's date: 08/31/2019 Total lbs lost to date: 0    08/31/2019  Height  (1.6 m)  Weight 338 lb (153.3 kg) (A)  BMI (Calculated) 59.89  BLOOD PRESSURE - SYSTOLIC 119  BLOOD PRESSURE - DIASTOLIC 82  RMR 1610    ASK: We discussed the diagnosis of obesity with [name] today and Emaya agreed to give [place] permission to discuss obesity behavioral modification therapy today.  ASSESS: Lula has the diagnosis of obesity and her BMI today is 59.89 Daisey is in the action stage of change   ADVISE: Telesa was educated on the multiple health risks of obesity as well as the benefit of weight loss to improve her health. She was advised of the need for long term treatment and the importance of lifestyle modifications to improve her current health and to decrease her risk of future health problems.  AGREE: Multiple dietary modification options and treatment options were discussed and  Demari agreed to follow the recommendations documented in the above note.  ARRANGE: Jahnasia was educated on the importance of frequent visits to treat obesity as outlined per CMS and USPSTF guidelines and agreed to schedule her next follow up appointment today.  I, [place] Crane, am acting as transcriptionist for [name], FNP-C  I have reviewed the above documentation for accuracy and completeness, and I agree with the above.  - Dawn Whitmire, FNP-C.

## 2019-09-20 ENCOUNTER — Encounter (INDEPENDENT_AMBULATORY_CARE_PROVIDER_SITE_OTHER): Payer: Self-pay | Admitting: Family Medicine

## 2019-09-20 ENCOUNTER — Other Ambulatory Visit: Payer: Self-pay

## 2019-09-20 ENCOUNTER — Ambulatory Visit (INDEPENDENT_AMBULATORY_CARE_PROVIDER_SITE_OTHER): Payer: Medicare Other | Admitting: Family Medicine

## 2019-09-20 VITALS — BP 126/83 | HR 60 | Temp 98.6°F | Ht 63.0 in | Wt 337.0 lb

## 2019-09-20 DIAGNOSIS — F3289 Other specified depressive episodes: Secondary | ICD-10-CM | POA: Diagnosis not present

## 2019-09-20 DIAGNOSIS — Z6841 Body Mass Index (BMI) 40.0 and over, adult: Secondary | ICD-10-CM | POA: Diagnosis not present

## 2019-09-20 DIAGNOSIS — E559 Vitamin D deficiency, unspecified: Secondary | ICD-10-CM

## 2019-09-20 MED ORDER — VITAMIN D (ERGOCALCIFEROL) 1.25 MG (50000 UNIT) PO CAPS: 50000.0000 [IU] | ORAL_CAPSULE | ORAL | 0 refills | Status: DC

## 2019-09-26 ENCOUNTER — Encounter (INDEPENDENT_AMBULATORY_CARE_PROVIDER_SITE_OTHER): Payer: Self-pay | Admitting: Family Medicine

## 2019-09-26 DIAGNOSIS — F32A Depression, unspecified: Secondary | ICD-10-CM | POA: Insufficient documentation

## 2019-09-26 DIAGNOSIS — F329 Major depressive disorder, single episode, unspecified: Secondary | ICD-10-CM | POA: Insufficient documentation

## 2019-09-26 NOTE — Progress Notes (Signed)
Office: [phone]  /  Fax: [phone]   HPI:   Chief Complaint: OBESITY [name] is here to discuss her progress with her obesity treatment plan. She is on the keep a food journal with 1600 calories and 90+ grams of protein daily plan and she is following her eating plan approximately 50 % of the time. She states she is exercising 0 minutes 0 times per week. [name] had recent surgery on her left foot to repair aneurysm. She reports pain in her lungs from general anesthesia. She reports not eating enough at times over the past few weeks due to not feeling well.  Her weight is (!) 337 lb (152.9 kg) today and has had a weight loss of 1 pound over a period of 3 weeks since her last visit. She has gained 41 lbs since starting treatment with [place].  Vitamin D deficiency [name] has a diagnosis of vitamin D deficiency. Her last vitamin D level was at 42.7, and was not quite at goal. [name] is currently taking vit D and she denies nausea, vomiting or muscle weakness.  Depression with emotional eating behaviors [name] reports a depressed mood. She is on Cymbalta currently. She has been through prolonged health issues over the past few years.  [name]'s initial office visit was 02/08/18 and she lost 27 pounds over the course of six months (296 lbs down to 269 lbs). She then did not follow up again until 08/11/18 and she had gained 63 pounds. She reports she stopped coming to our clinic due to serious health issues she was experiencing. She had osteomyelitis in her shoulder and left foot and has had several surgeries and hospitalizations related to this. .  She may possibly lose her foot.  She shows no sign of suicidal or homicidal ideations.  ASSESSMENT AND PLAN:  Vitamin D deficiency - Plan: Vitamin D, Ergocalciferol, (DRISDOL) 1.25 MG (50000 UT) CAPS capsule  Other depression, with emotional eating   Class 3 severe obesity with serious comorbidity and body mass index (BMI) of 50.0 to 59.9 in adult,  unspecified obesity type (HCC)  PLAN:  Vitamin D Deficiency [name] was informed that low vitamin D levels contributes to fatigue and are associated with obesity, breast, and colon cancer. [name] agrees to continue to take prescription Vit D @50 ,000 IU every week #4 with no refills and she will follow up for routine testing of vitamin D, at least 2-3 times per year. She was informed of the risk of over-replacement of vitamin D and agrees to not increase her dose unless she discusses this with first. [name] agrees to follow up with our clinic in 3 weeks.  Depression with Emotional Eating Behaviors We discussed behavior modification techniques today to help [name] deal with her emotional eating and depression. We will refer patient to Dr. Rinaldo Cloud our bariatric psychologist.  Obesity [name] is currently in the action stage of change. As such, her goal is to continue with weight loss efforts She has agreed to keep a food journal with 1600 calories and 90 grams of protein daily or follow the Category 3 plan +100 calories [name] has been instructed to work up to a goal of 150 minutes of combined cardio and strengthening exercise per week for weight loss and overall health benefits. We discussed the following Behavioral Modification Strategies today: planning for success, keep a strict food journal and increasing lean protein intake   [name] has agreed to follow up with our clinic in 3 weeks. She was informed of the  importance of frequent follow up visits to maximize her success with intensive lifestyle modifications for her multiple health conditions.  ALLERGIES: Allergies  Allergen Reactions   Penicillins Hives, Shortness Of Breath and Other (See Comments)    Has patient had a PCN reaction causing immediate rash, facial/tongue/throat swelling, SOB or lightheadedness with hypotension: Yes Has patient had a PCN reaction causing severe rash involving mucus membranes or skin necrosis: No Has  patient had a PCN reaction that required hospitalization: No Has patient had a PCN reaction occurring within the last 10 years: No If all of the above answers are "NO", then may proceed with Cephalosporin use.    Codeine Hives    MEDICATIONS: Current Outpatient Medications on File Prior to Visit  Medication Sig Dispense Refill   Ascorbic Acid (VITAMIN C) 1000 MG tablet Take 1,000 mg by mouth daily.     DULoxetine (CYMBALTA) 30 MG capsule Take 90 mg by mouth daily.     losartan-hydrochlorothiazide (HYZAAR) 100-25 MG tablet Take 1 tablet by mouth daily.     metoprolol tartrate (LOPRESSOR) 25 MG tablet TAKE 1/2 TABLET BY MOUTH TWICE DAILY 30 tablet 0   pantoprazole (PROTONIX) 40 MG tablet Take 1 tablet (40 mg total) by mouth daily. 30 tablet 1   traMADol (ULTRAM) 50 MG tablet Take 50 mg by mouth 3 (three) times daily as needed for moderate pain.   0   XARELTO 20 MG TABS tablet Take 20 mg by mouth daily with supper.      No current facility-administered medications on file prior to visit.     PAST MEDICAL HISTORY: Past Medical History:  Diagnosis Date   Atrial fibrillation (HCC)    Chronic lower back pain    Depression    DVT (deep venous thrombosis) ([place]) 1990s   LLE   Fallen arches    Hypertension    Hyperthyroidism ~ 2000   "fine now" (04/23/2016)   Joint pain    Lactose intolerance    Leg edema    Obesity    Pneumonia 1980s X 1   Snoring    has not had sleep study but suspects sleep apnea    PAST SURGICAL HISTORY: Past Surgical History:  Procedure Laterality Date   CARDIOVERSION N/A 06/18/2016   Procedure: CARDIOVERSION;  Surgeon: Larey Dresser, MD;  Location: [facility];  Service: Cardiovascular;  Laterality: N/A;   CARDIOVERSION N/A 07/11/2016   Procedure: CARDIOVERSION;  Surgeon: Lelon Perla, MD;  Location: [facility] ENDOSCOPY;  Service: Cardiovascular;  Laterality: N/A;   CARDIOVERSION N/A 07/02/2017   Procedure: CARDIOVERSION;  Surgeon:  Sanda Klein, MD;  Location: [place] ENDOSCOPY;  Service: Cardiovascular;  Laterality: N/A;   CARDIOVERSION N/A 09/29/2017   Procedure: CARDIOVERSION;  Surgeon: Larey Dresser, MD;  Location: [place];  Service: Cardiovascular;  Laterality: N/A;   ENDOMETRIAL ABLATION  ~2006   TONSILLECTOMY  1960s   TUBAL LIGATION  1979    SOCIAL HISTORY: Social History   Tobacco Use   Smoking status: Former Smoker    Packs/day: 0.75    Years: 10.00    Pack years: 7.50    Types: Cigarettes   Smokeless tobacco: Never Used   Tobacco comment: 04/23/2016 "stopped smoking cigarettes in ~ 1989"  Substance Use Topics   Alcohol use: Yes    Alcohol/week: 4.0 standard drinks    Types: 4 Glasses of wine per week    Comment: recently quit   Drug use: Yes    Types: Marijuana    Comment:  04/23/2016 "recreational marijuana in my late teens/early 20's"    FAMILY HISTORY: Family History  Problem Relation Age of Onset   Diabetes Mellitus [name] Father    CVA Father    Arthritis Mother    Obesity Mother    Thyroid disease Mother    Liver disease Mother    CAD Maternal Grandfather     ROS: Review of Systems  Constitutional: Positive for weight loss.  Gastrointestinal: Negative for nausea and vomiting.  Musculoskeletal:       Negative for muscle weakness  Psychiatric/Behavioral: Positive for depression. Negative for suicidal ideas.    PHYSICAL EXAM: Blood pressure 126/83, pulse 60, temperature 98.6 F (37 C), temperature source Oral, height 5\' 3"  (1.6 m), weight (!) 337 lb (152.9 kg), SpO2 100 %. Body mass index is 59.7 kg/m. Physical Exam Vitals signs reviewed.  Constitutional:      Appearance: Normal appearance. She is well-developed. She is obese.  Cardiovascular:     Rate and Rhythm: Normal rate.  Pulmonary:     Effort: Pulmonary effort is normal.  Musculoskeletal: Normal range of motion.  Skin:    General: Skin is warm and dry.  Neurological:     Mental Status: She is  alert and oriented to person, place, and time.     Comments: +Uses cane (left foot in boot)  Psychiatric:        Mood and Affect: Mood normal.        Behavior: Behavior normal.        Thought Content: Thought content does not include homicidal or suicidal ideation.     RECENT LABS AND TESTS: BMET    Component Value Date/Time   NA 137 08/11/2019 1320   K 4.6 08/11/2019 1320   CL 103 08/11/2019 1320   CO2 19 (L) 08/11/2019 1320   GLUCOSE 89 08/11/2019 1320   GLUCOSE 97 08/24/2018 1457   BUN 33 (H) 08/11/2019 1320   CREATININE 0.88 08/11/2019 1320   CREATININE 0.66 06/13/2016 1432   CALCIUM 9.7 08/11/2019 1320   GFRNONAA 71 08/11/2019 1320   GFRAA 82 08/11/2019 1320   Lab Results  Component Value Date   HGBA1C 5.8 (H) 08/11/2019   HGBA1C 5.5 06/30/2018   HGBA1C 5.7 (H) 02/08/2018   Lab Results  Component Value Date   INSULIN 14.4 08/11/2019   INSULIN 4.8 06/30/2018   INSULIN 5.8 02/08/2018   CBC    Component Value Date/Time   WBC 11.2 (H) 08/24/2018 1457   RBC 4.76 08/24/2018 1457   HGB 12.9 08/24/2018 1457   HGB 14.4 02/08/2018 1011   HCT 40.3 08/24/2018 1457   HCT 41.8 02/08/2018 1011   PLT 471 (H) 08/24/2018 1457   PLT 254 02/08/2018 1011   MCV 84.7 08/24/2018 1457   MCV 80 02/08/2018 1011   MCH 27.1 08/24/2018 1457   MCHC 32.0 08/24/2018 1457   RDW 14.0 08/24/2018 1457   RDW 14.2 02/08/2018 1011   LYMPHSABS 1.4 08/24/2018 1457   LYMPHSABS 1.3 02/08/2018 1011   MONOABS 1.9 (H) 08/24/2018 1457   EOSABS 0.1 08/24/2018 1457   EOSABS 0.2 02/08/2018 1011   BASOSABS 0.1 08/24/2018 1457   BASOSABS 0.0 02/08/2018 1011   Iron/TIBC/Ferritin/ %Sat No results found for: IRON, TIBC, FERRITIN, IRONPCTSAT Lipid Panel     Component Value Date/Time   CHOL 166 02/08/2018 1011   TRIG 75 02/08/2018 1011   HDL 75 02/08/2018 1011   CHOLHDL 2.2 02/08/2018 1011   LDLCALC 76 02/08/2018 1011  Hepatic Function Panel     Component Value Date/Time   PROT 7.4  08/11/2019 1320   ALBUMIN 4.7 08/11/2019 1320   AST 13 08/11/2019 1320   ALT 9 08/11/2019 1320   ALKPHOS 80 08/11/2019 1320   BILITOT 0.3 08/11/2019 1320      Component Value Date/Time   TSH 3.710 08/11/2019 1320   TSH 3.570 06/30/2018 1357   TSH 4.800 (H) 02/08/2018 1011     Ref. Range 08/11/2019 13:20  Vitamin D, 25-Hydroxy Latest Ref Range: 30.0 - 100.0 ng/mL 42.7    OBESITY BEHAVIORAL INTERVENTION VISIT  Today's visit was # 12   Starting weight: 296 lbs Starting date: 02/08/2018 Today's weight : 337 lbs Today's date: 09/20/2019 Total lbs lost to date: 0    09/20/2019  Height  (1.6 m)  Weight 337 lb (152.9 kg) (A)  BMI (Calculated) 59.71  BLOOD PRESSURE - SYSTOLIC 126  BLOOD PRESSURE - DIASTOLIC 83    ASK: We discussed the diagnosis of obesity with [name] today and [name] agreed to give [place] permission to discuss obesity behavioral modification therapy today.  ASSESS: [name] has the diagnosis of obesity and her BMI today is 59.71 [name] is in the action stage of change   ADVISE: [name] was educated on the multiple health risks of obesity as well as the benefit of weight loss to improve her health. She was advised of the need for long term treatment and the importance of lifestyle modifications to improve her current health and to decrease her risk of future health problems.  AGREE: Multiple dietary modification options and treatment options were discussed and  [name] agreed to follow the recommendations documented in the above note.  ARRANGE: [name] was educated on the importance of frequent visits to treat obesity as outlined per CMS and USPSTF guidelines and agreed to schedule her next follow up appointment today.  [name], [place] Crane, am acting as transcriptionist for [name], FNP-C  [name] have reviewed the above documentation for accuracy and completeness, and [name] agree with the above.  - Fredrik Mogel, FNP-C.
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
## 2019-10-11 ENCOUNTER — Ambulatory Visit (INDEPENDENT_AMBULATORY_CARE_PROVIDER_SITE_OTHER): Payer: Medicare Other | Admitting: Psychology

## 2019-10-11 ENCOUNTER — Encounter (INDEPENDENT_AMBULATORY_CARE_PROVIDER_SITE_OTHER): Payer: Self-pay | Admitting: Family Medicine

## 2019-10-11 ENCOUNTER — Other Ambulatory Visit: Payer: Self-pay

## 2019-10-11 ENCOUNTER — Telehealth (INDEPENDENT_AMBULATORY_CARE_PROVIDER_SITE_OTHER): Payer: Medicare Other | Admitting: Family Medicine

## 2019-10-11 DIAGNOSIS — F3289 Other specified depressive episodes: Secondary | ICD-10-CM

## 2019-10-11 DIAGNOSIS — E559 Vitamin D deficiency, unspecified: Secondary | ICD-10-CM

## 2019-10-11 DIAGNOSIS — Z6841 Body Mass Index (BMI) 40.0 and over, adult: Secondary | ICD-10-CM

## 2019-10-11 MED ORDER — VITAMIN D (ERGOCALCIFEROL) 1.25 MG (50000 UNIT) PO CAPS: 50000.0000 [IU] | ORAL_CAPSULE | ORAL | 0 refills | Status: DC

## 2019-10-12 NOTE — Progress Notes (Signed)
Office: [phone]  /  Fax: [phone] TeleHealth Visit:  [name] has verbally consented to this TeleHealth visit today. The patient is located at home, the provider is located at the [facility] office. The participants in this visit include the listed provider and patient and any and all parties involved. The visit was conducted today via WebEx.  HPI:  Chief Complaint: OBESITY [name] is here to discuss her progress with her obesity treatment plan. She is on the keep a food journal with 1500 calories and 90 grams of protein daily and she states she is following her eating plan approximately 70 % of the time. She states she is exercising 0 minutes 0 times per week.  [name] reports that she has lost 2 to 5 pounds (weight not reported). She had recent foot surgery which is impeding her mobility. [name] is working hard to get her protein in. Her husband brings snacks in the house that are tempting to her. She is happy to be getting back on track.  Vitamin D deficiency [name] has a diagnosis of vitamin D deficiency. Her vitamin D level is nearly at goal. Her last vitamin D level was at 42.7 in October 2020. She is on prescription vitamin D weekly. [name] admits fatigue.   Other Depression with emotional eating behaviors [name] does admit to cravings for food brought into the house by her husband. Her mood is stable on Cymbalta. [name] is seeing Dr. Dewaine Conger today (first visit).    ASSESSMENT AND PLAN:  Vitamin D deficiency - Plan: Vitamin D, Ergocalciferol, (DRISDOL) 1.25 MG (50000 UT) CAPS capsule  Other depression, with emotional eating  Class 3 severe obesity with serious comorbidity and body mass index (BMI) of 50.0 to 59.9 in adult, unspecified obesity type (HCC)  PLAN:  Vitamin D Deficiency Low vitamin D level contributes to fatigue and are associated with obesity, breast, and colon cancer. [name] agrees to continue to take prescription Vit D ,000 IU  every week #4 with no refills and she will follow up for routine testing of vitamin D, at least 2-3 times per year to avoid over-replacement. [name] agrees to follow up with our clinic in 3 weeks.  Emotional Eating Behaviors (other depression) Strategies for sabotage and emotional eating were discussed today. [name] will continue with Dr. Dewaine Conger. We will continue to follow and monitor her progress.  Obesity [name] is currently in the action stage of change. As such, her goal is to continue with weight loss efforts She has agreed to keep a food journal with 1500 to 1600 calories and 90 grams of protein daily  We discussed the following Behavioral Modification Strategies today: planning for success, keep a strict food journal, increasing lean protein intake, decreasing simple carbohydrates, dealing with family or coworker sabotage and avoiding temptations  [name] has agreed to follow up with our clinic in 3 weeks. She was informed of the importance of frequent follow up visits to maximize her success with intensive lifestyle modifications for her multiple health conditions.  ALLERGIES: Allergies  Allergen Reactions  . Penicillins Hives, Shortness Of Breath and Other (See Comments)    Has patient had a PCN reaction causing immediate rash, facial/tongue/throat swelling, SOB or lightheadedness with hypotension: Yes Has patient had a PCN reaction causing severe rash involving mucus membranes or skin necrosis: No Has patient had a PCN reaction that required hospitalization: No Has patient had a PCN reaction occurring within the last 10 years: No If all of the above answers  are "NO", then may proceed with Cephalosporin use.   . Codeine Hives    MEDICATIONS: Current Outpatient Medications on File Prior to Visit  Medication Sig Dispense Refill  . Ascorbic Acid (VITAMIN C) 1000 MG tablet Take 1,000 mg by mouth daily.    . DULoxetine (CYMBALTA) 30 MG capsule Take 90 mg by mouth daily.    Marland Kitchen  losartan-hydrochlorothiazide (HYZAAR) 100-25 MG tablet Take 1 tablet by mouth daily.    . metoprolol tartrate (LOPRESSOR) 25 MG tablet TAKE 1/2 TABLET BY MOUTH TWICE DAILY 30 tablet 0  . pantoprazole (PROTONIX) 40 MG tablet Take 1 tablet (40 mg total) by mouth daily. 30 tablet 1  . traMADol (ULTRAM) 50 MG tablet Take 50 mg by mouth 3 (three) times daily as needed for moderate pain.   0  . XARELTO 20 MG TABS tablet Take 20 mg by mouth daily with supper.      No current facility-administered medications on file prior to visit.    PAST MEDICAL HISTORY: Past Medical History:  Diagnosis Date  . Atrial fibrillation ([place])   . Chronic lower back pain   . Depression   . DVT (deep venous thrombosis) (HCC) 1990s   LLE  . Fallen arches   . Hypertension   . Hyperthyroidism ~ 2000   "fine now" (04/23/2016)  . Joint pain   . Lactose intolerance   . Leg edema   . Obesity   . Pneumonia 1980s X 1  . Snoring    has not had sleep study but suspects sleep apnea    PAST SURGICAL HISTORY: Past Surgical History:  Procedure Laterality Date  . CARDIOVERSION N/A 06/18/2016   Procedure: CARDIOVERSION;  Surgeon: Larey Dresser, MD;  Location: [place];  Service: Cardiovascular;  Laterality: N/A;  . CARDIOVERSION N/A 07/11/2016   Procedure: CARDIOVERSION;  Surgeon: Lelon Perla, MD;  Location: [facility] ENDOSCOPY;  Service: Cardiovascular;  Laterality: N/A;  . CARDIOVERSION N/A 07/02/2017   Procedure: CARDIOVERSION;  Surgeon: Sanda Klein, MD;  Location: [facility]OSCOPY;  Service: Cardiovascular;  Laterality: N/A;  . CARDIOVERSION N/A 09/29/2017   Procedure: CARDIOVERSION;  Surgeon: Larey Dresser, MD;  Location: [facility] ENDOSCOPY;  Service: Cardiovascular;  Laterality: N/A;  . ENDOMETRIAL ABLATION  ~2006  . TONSILLECTOMY  1960s  . TUBAL LIGATION  1979    SOCIAL HISTORY: Social History   Tobacco Use  . Smoking status: Former Smoker    Packs/day: 0.75    Years: 10.00    Pack years: 7.50    Types:  Cigarettes  . Smokeless tobacco: Never Used  . Tobacco comment: 04/23/2016 "stopped smoking cigarettes in ~ 1989"  Substance Use Topics  . Alcohol use: Yes    Alcohol/week: 4.0 standard drinks    Types: 4 Glasses of wine per week    Comment: recently quit  . Drug use: Yes    Types: Marijuana    Comment: 04/23/2016 "recreational marijuana in my late teens/early 20's"    FAMILY HISTORY: Family History  Problem Relation Age of Onset  . Diabetes Mellitus I Father   . CVA Father   . Arthritis Mother   . Obesity Mother   . Thyroid disease Mother   . Liver disease Mother   . CAD Maternal Grandfather     ROS: Review of Systems  Constitutional: Positive for malaise/fatigue and weight loss.  Psychiatric/Behavioral: Positive for depression.    PHYSICAL EXAM: There were no vitals taken for this visit. There is no height or weight on file  to calculate BMI. Physical Exam Vitals reviewed.  Constitutional:      General: She is not in acute distress.    Appearance: Normal appearance. She is well-developed. She is obese.  Cardiovascular:     Rate and Rhythm: Normal rate.  Pulmonary:     Effort: Pulmonary effort is normal.  Musculoskeletal:        General: Normal range of motion.  Skin:    General: Skin is warm and dry.  Neurological:     Mental Status: She is alert and oriented to person, place, and time.  Psychiatric:        Mood and Affect: Mood normal.        Behavior: Behavior normal.     RECENT LABS AND TESTS: BMET    Component Value Date/Time   NA 137 08/11/2019 1320   K 4.6 08/11/2019 1320   CL 103 08/11/2019 1320   CO2 19 (L) 08/11/2019 1320   GLUCOSE 89 08/11/2019 1320   GLUCOSE 97 08/24/2018 1457   BUN 33 (H) 08/11/2019 1320   CREATININE 0.88 08/11/2019 1320   CREATININE 0.66 06/13/2016 1432   CALCIUM 9.7 08/11/2019 1320   GFRNONAA 71 08/11/2019 1320   GFRAA 82 08/11/2019 1320   Lab Results  Component Value Date   HGBA1C 5.8 (H) 08/11/2019   HGBA1C 5.5  06/30/2018   HGBA1C 5.7 (H) 02/08/2018   Lab Results  Component Value Date   INSULIN 14.4 08/11/2019   INSULIN 4.8 06/30/2018   INSULIN 5.8 02/08/2018   CBC    Component Value Date/Time   WBC 11.2 (H) 08/24/2018 1457   RBC 4.76 08/24/2018 1457   HGB 12.9 08/24/2018 1457   HGB 14.4 02/08/2018 1011   HCT 40.3 08/24/2018 1457   HCT 41.8 02/08/2018 1011   PLT 471 (H) 08/24/2018 1457   PLT 254 02/08/2018 1011   MCV 84.7 08/24/2018 1457   MCV 80 02/08/2018 1011   MCH 27.1 08/24/2018 1457   MCHC 32.0 08/24/2018 1457   RDW 14.0 08/24/2018 1457   RDW 14.2 02/08/2018 1011   LYMPHSABS 1.4 08/24/2018 1457   LYMPHSABS 1.3 02/08/2018 1011   MONOABS 1.9 (H) 08/24/2018 1457   EOSABS 0.1 08/24/2018 1457   EOSABS 0.2 02/08/2018 1011   BASOSABS 0.1 08/24/2018 1457   BASOSABS 0.0 02/08/2018 1011   Iron/TIBC/Ferritin/ %Sat No results found for: IRON, TIBC, FERRITIN, IRONPCTSAT Lipid Panel     Component Value Date/Time   CHOL 166 02/08/2018 1011   TRIG 75 02/08/2018 1011   HDL 75 02/08/2018 1011   CHOLHDL 2.2 02/08/2018 1011   LDLCALC 76 02/08/2018 1011   Hepatic Function Panel     Component Value Date/Time   PROT 7.4 08/11/2019 1320   ALBUMIN 4.7 08/11/2019 1320   AST 13 08/11/2019 1320   ALT 9 08/11/2019 1320   ALKPHOS 80 08/11/2019 1320   BILITOT 0.3 08/11/2019 1320      Component Value Date/Time   TSH 3.710 08/11/2019 1320   TSH 3.570 06/30/2018 1357   TSH 4.800 (H) 02/08/2018 1011     OBESITY BEHAVIORAL INTERVENTION VISIT DOCUMENTATION FOR INSURANCE (~15 minutes)    ASK: We discussed the diagnosis of obesity with Abigail Lansdale today and Nalaya agreed to give [place] permission to discuss obesity behavioral modification therapy today.  ASSESS: Callahan has the diagnosis of obesity  Shaqueta is in the action stage of change   ADVISE: Kavina was educated on the multiple health risks of obesity as well as the  benefit of weight loss to improve her health. She was  advised of the need for long term treatment and the importance of lifestyle modifications to improve her current health and to decrease her risk of future health problems.  AGREE: Multiple dietary modification options and treatment options were discussed and  [name] agreed to follow the recommendations documented in the above note.  ARRANGE: [name] was educated on the importance of frequent visits to treat obesity as outlined per CMS and USPSTF guidelines and agreed to schedule her next follow up appointment today.   I, [place] CraneJoanne Murray, am acting as transcriptionist for AshlandDawn Chaeli Judy, FNP-C  I have reviewed the above documentation for accuracy and completeness, and I agree with the above.  - Abigail Peil, FNP-C.

## 2019-10-31 NOTE — Progress Notes (Signed)
Office: [phone]  /  Fax: [phone]    Date: November 03, 2019   Time Seen: 9:33am Duration: 28 minutes Provider: Glennie Isle, Psy.D. Type of Session: Individual Therapy  Type of Contact: Face-to-face  Informed Consent for In-Person Services During COVID-19: During today's appointment, information about the decision to initiate in-person services in light of the AQTMA-26 public health crisis was discussed. Ciin and this provider agreed to meet in person for some or all future appointments. If there is a resurgence of the pandemic or other health concerns arise, telepsychological services may be reinitiated and any related concerns will be discussed and an attempt to address them will be made. [name] verbally acknowledged understanding that if necessary, this provider may determine there is a need to initiate telepsychological services for everyone's well-being. [name] expressed understanding she may request to initiate telepsychological services, and that request will be respected as long as it is feasible and clinically appropriate. Moreover, the risks for opting for in-person services was discussed. [name] verbally acknowledged understanding that by coming to the office, she is assuming the risk of exposure to the coronavirus or other public risk, and the risk may increase if [name] travels by public transportation, cab, or [employer]. To obtain in-person services, [name] verbally agreed to taking certain precautions (e.g., screening prior to appointment; universal masking; social distancing of 6 feet; proper hand hygiene; no visitors) set forth by [facility] to keep everyone safe from exposure, sickness, and possible death. This information was  shared by front desk staff either at the time of scheduling and/or during the check-in process. [name] expressed understanding that should she not adhere to these safeguards, it may result in starting/returning to a telepsychological  service arrangement and/or the exploration of other options for treatment. [name] acknowledged understanding that [facility] will follow the protocol set forth by [facility] should a patient present with a fever or other symptoms or disclose recent exposure, which will include rescheduling the appointment. Furthermore, [name] acknowledged understanding that precautions may change if additional local, state or federal orders or guidelines are published. This provider also shared that if [name] tests positive for the coronavirus, this provider may be required to notify local health authorities that [name] was in the [facility] clinic. Only minimum information necessary for data collection will be disclosed. This provider will follow [facility]'s disclosure policy should this provider or staff test positive for the coronavirus. To avoid handling of paper/writing instruments and increasing likelihood of touching, verbal consent was obtained by [name] during today's appointment prior to proceeding. [name] provided verbal consent to proceed, and acknowledged understanding that by verbally consenting to proceed, she is agreeable to all information noted above.   Session Content: [name] is a 64 y.o. female presenting for a follow-up appointment to address the previously established treatment goal of decreasing emotional eating. The session was initiated with the administration of the PHQ-9 and GAD-7, as well as a brief check-in. [name] shared about recent events, noting she will be starting physical therapy. She also discussed the impact of her medical conditions on her emotional well-being. As such, this provider recommended longer-term therapeutic services due to ongoing stressors and discussed options to establish care with a new provider, including [name] contacting her insurance company for a list of [employer], exploring [email or web address], or this provider placing a  referral. [name] provided verbal consent for this provider to place a referral to address ongoing stressors and emotional eating. Physical and emotional hunger was  reviewed and a copy of the handout was provided. During the holidays, [name] discussed making better choices and engaging in portion control.  Brief psychoeducation regarding triggers for emotional eating was provided. Terena was provided a handout, and encouraged to utilize the handout between now and the next appointment to increase awareness of triggers and frequency. Alysandra agreed, adding "I'm glad you're making me think about this." Overall, [name] was receptive to today's appointment as evidenced by openness to sharing, responsiveness to feedback, and willingness to explore triggers for emotional eating.  Mental Status Examination:  Appearance: well groomed and appropriate hygiene  Behavior: appropriate to circumstances Mood: euthymic Affect: mood congruent Speech: normal in rate, volume, and tone Eye Contact: appropriate Psychomotor Activity: appropriate Gait: ambulates with walker Thought Process: linear, logical, and goal directed  Thought Content/Perception: no hallucinations, delusions, bizarre thinking or behavior reported or observed and no evidence of suicidal and homicidal ideation, plan, and intent Orientation: time, person, place and purpose of appointment Memory/Concentration: memory, attention, language, and fund of knowledge intact  Insight/Judgment: good  Structured Assessments Results: The Patient Health Questionnaire-9 (PHQ-9) is a self-report measure that assesses symptoms and severity of depression over the course of the last two weeks. [name] obtained a score of 11 suggesting moderate depression. [name] finds the endorsed symptoms to be extremely difficult. [0= Not at all; 1= Several days; 2= More than half the days; 3= Nearly every day] Little interest or pleasure in doing things 1  Feeling down, depressed,  or hopeless 2  Trouble falling or staying asleep, or sleeping too much 1  Feeling tired or having little energy 1  Poor appetite or overeating 2  Feeling bad about yourself --- or that you are a failure or have let yourself or your family down 3  Trouble concentrating on things, such as reading the newspaper or watching television 1  Moving or speaking so slowly that other people could have noticed? Or the opposite --- being so fidgety or restless that you have been moving around a lot more than usual 0  Thoughts that you would be better off dead or hurting yourself in some way 0  PHQ-9 Score 11    The Generalized Anxiety Disorder-7 (GAD-7) is a brief self-report measure that assesses symptoms of anxiety over the course of the last two weeks. Jerolyn obtained a score of 7 suggesting mild anxiety. Pama finds the endorsed symptoms to be very difficult. [0= Not at all; 1= Several days; 2= Over half the days; 3= Nearly every day] Feeling nervous, anxious, on edge 1  Not being able to stop or control worrying 1  Worrying too much about different things 1  Trouble relaxing 3  Being so restless that it's hard to sit still 0  Becoming easily annoyed or irritable 1  Feeling afraid as if something awful might happen 0  GAD-7 Score 7   Interventions:  Conducted a brief chart review Verbally administered PHQ-9 and GAD-7 for symptom monitoring Provided empathic reflections and validation Reviewed content from the previous session Employed supportive psychotherapy interventions to facilitate reduced distress and to improve coping skills with identified stressors Employed motivational interviewing skills to assess patient's willingness/desire to adhere to recommended medical treatments and assignments Psychoeducation provided regarding triggers for emotional eating Recommended/discussed option for longer-term therapeutic services  DSM-5 Diagnosis: 311 (F32.8) Other Specified Depressive Disorder,  Emotional Eating Behaviors  Treatment Goal & Progress: During the initial appointment with this provider, the following treatment goal was established: decrease emotional eating. Rinaldo Cloud  has demonstrated progress in her goal as evidenced by increased awareness of hunger patterns.   Plan: The next appointment will be scheduled in two weeks. The next session will focus on working towards the established treatment goal.

## 2019-11-02 ENCOUNTER — Ambulatory Visit (INDEPENDENT_AMBULATORY_CARE_PROVIDER_SITE_OTHER): Payer: Self-pay | Admitting: Family Medicine

## 2019-11-03 ENCOUNTER — Ambulatory Visit (INDEPENDENT_AMBULATORY_CARE_PROVIDER_SITE_OTHER): Payer: Medicare Other | Admitting: Family Medicine

## 2019-11-03 ENCOUNTER — Ambulatory Visit (INDEPENDENT_AMBULATORY_CARE_PROVIDER_SITE_OTHER): Payer: Medicare Other | Admitting: Psychology

## 2019-11-03 ENCOUNTER — Other Ambulatory Visit: Payer: Self-pay

## 2019-11-03 VITALS — BP 142/77 | HR 68 | Temp 98.1°F | Ht 63.0 in | Wt 344.0 lb

## 2019-11-03 DIAGNOSIS — F3289 Other specified depressive episodes: Secondary | ICD-10-CM

## 2019-11-03 DIAGNOSIS — R062 Wheezing: Secondary | ICD-10-CM | POA: Diagnosis not present

## 2019-11-03 DIAGNOSIS — Z6841 Body Mass Index (BMI) 40.0 and over, adult: Secondary | ICD-10-CM

## 2019-11-03 MED ORDER — BUPROPION HCL ER (SR) 150 MG PO TB12: 150.0000 mg | ORAL_TABLET | Freq: Every day | ORAL | 0 refills | Status: DC

## 2019-11-03 NOTE — Progress Notes (Signed)
  Office: [phone]  /  Fax: [phone]    Date: November 17, 2019   Time Seen: 10:07am Duration: 27 minutes Provider: Lawerance Cruel, Psy.D. Type of Session: Individual Therapy  Type of Contact: Face-to-face  Session Content: [name] is a 64 y.o. female presenting for a follow-up appointment to address the previously established treatment goal of decreasing emotional eating. The session was initiated with a brief check-in. [name] shared she may not be able to continue services with this provider or any other mental health provider due to co-pays. This was discussed further, and she indicated a plan to call [facility] to see if she can schedule an appointment for the future. The phone number for [facility] was provided. Regarding eating, [name] shared, "I think it's going pretty well," noting she is journaling every day. Positive reinforcement was provided. She indicated a plan to improve eating breakfast consistently. It was recommended she explore quick options with [name], FNP-C. She agreed. This provider also discussed the impact of increased hunger on well-being (e.g., increased irritability, feeling unfocused). Moreover, [name] indicated she is "dreading" seeing her cardiologist due to her current weight. This was further explored, and she was engaged in thought challenging. Session then focused on reviewing triggers for emotional eating. She shared "being focused" on her eating habits has resulted in a reduction in emotional eating. This provider also discussed behavioral strategies for specific triggers, such as placing the utensil down when conversing to avoid mindless eating. Overall, [name] was receptive to today's appointment as evidenced by openness to sharing, responsiveness to feedback, and willingness to implement discussed strategies .  Mental Status Examination:  Appearance: well groomed and appropriate hygiene  Behavior: appropriate to  circumstances Mood: euthymic Affect: mood congruent Speech: normal in rate, volume, and tone Eye Contact: appropriate Psychomotor Activity: appropriate Gait: ambulates with walker Thought Process: linear, logical, and goal directed  Thought Content/Perception: no hallucinations, delusions, bizarre thinking or behavior reported or observed and no evidence of suicidal and homicidal ideation, plan, and intent Orientation: time, person, place and purpose of appointment Memory/Concentration: memory, attention, language, and fund of knowledge intact  Insight/Judgment: good  Interventions:  Conducted a brief chart review Provided empathic reflections and validation Reviewed content from the previous session Provided positive reinforcement Employed supportive psychotherapy interventions to facilitate reduced distress and to improve coping skills with identified stressors Employed motivational interviewing skills to assess patient's willingness/desire to adhere to recommended medical treatments and assignments Engaged patient in thought challenging/cognitive restructuring   DSM-5 Diagnosis: 311 (F32.8) Other Specified Depressive Disorder, Emotional Eating Behaviors  Treatment Goal & Progress: During the initial appointment with this provider, the following treatment goal was established: decrease emotional eating. [name] has demonstrated progress in her goal as evidenced by increased awareness of hunger patterns, increased awareness of triggers for emotional eating and reduction in emotional eating. [name] also continues to demonstrate willingness to engage in learned skill(s).  Plan: Per [name]'s request, the next appointment will be scheduled in one month, which will be via Webex. The next session will focus on working towards the established treatment goal.

## 2019-11-04 ENCOUNTER — Ambulatory Visit: Payer: Medicare Other | Admitting: Physical Therapy

## 2019-11-07 NOTE — Progress Notes (Signed)
Chief Complaint:   OBESITY [name] is here to discuss her progress with her obesity treatment plan along with follow-up of her obesity related diagnoses. [name] is keeping a food journal and adhering to recommended goals of 1500 calories and 90g of protein daily and states she is following her eating plan approximately 85% of the time. [name] states she is not exercising.   Today's visit was #: 14 Starting weight: 296 lbs Starting date: 02/08/18 Today's weight: 344 lbs Today's date: 11/07/2019 Total lbs lost to date: 0 Total lbs lost since last in-office visit: 0  Interim History: [name] is journaling most days. She struggles to get enough protein. She was off the plan over the holidays. She is in a boot S/P left foot surgery for sequale of osteomyelitis so activity is minimal.    Subjective:   1. Wheezing She reports wheezing with and without exertion. She does have an inhaler prescribed by her PCP. I heard audible wheezing when she first came into exam room. She smoked 1 pack per day for 15 years but quit many years ago. She has shortness of breath.    2. Emotional eating (other depression) [name] is struggling with emotional eating and using food for comfort to the extent that it is negatively impacting her health. She often snacks when she is not hungry. [name] sometimes feels she is out of control and then feels guilty that she made poor food choices. She has been working on behavior modification techniques to help reduce her emotional eating and has been somewhat successful. She feels she is depressed. She shows no sign of suicidal or homicidal ideations.  Assessment/Plan:   1. Wheezing [name] agrees to see her PCP for continued wheezing and use inhaler as needed. Will continue to monitor.   2. Other depression Behavior modification techniques were discussed today to help [name] deal with her emotional/non-hunger eating behaviors.  Orders and follow up as  documented in patient record.  We will start new RX of bupropion today. Patient denies history of glaucoma or seizures. - buPROPion (WELLBUTRIN SR) 150 MG 12 hr tablet; Take 1 tablet (150 mg total) by mouth daily.  Dispense: 30 tablet; Refill: 0  3. Class 3 severe obesity with serious comorbidity and body mass index (BMI) of 60.0 to 69.9 in adult, unspecified obesity type [name]) [name] is currently in the action stage of change. As such, her goal is to continue with weight loss efforts. She has agreed to keeping a food journal and adhering to recommended goals of 1500 calories and 90g of protein daily. We discussed high protein low calorie food. Handouts given on high protein low calorie food, protein content, and soup recipes.   We discussed the following exercise goals today: Will start PT soon.   We discussed the following behavioral modification strategies today: increasing lean protein intake, planning for success and keeping a strict food journal.  [name] has agreed to follow-up with our clinic in 2 weeks. She was informed of the importance of frequent follow-up visits to maximize her success with intensive lifestyle modifications for her multiple health conditions.   Objective:   Blood pressure (!) 142/77, pulse 68, temperature 98.1 F (36.7 C), temperature source Oral, height 5\' 3"  (1.6 m), weight (!) 344 lb (156 kg), SpO2 95 %. Body mass index is 60.94 kg/m.  General: Cooperative, alert, well developed, in no acute distress. HEENT: Conjunctivae and lids unremarkable. Neck: No thyromegaly.  Cardiovascular: Regular rhythm.  Lungs:  Normal work of breathing. Extremities: No edema.  Neurologic: No focal deficits.   Lab Results  Component Value Date   CREATININE 0.88 08/11/2019   BUN 33 (H) 08/11/2019   NA 137 08/11/2019   K 4.6 08/11/2019   CL 103 08/11/2019   CO2 19 (L) 08/11/2019   Lab Results  Component Value Date   ALT 9 08/11/2019   AST 13 08/11/2019    ALKPHOS 80 08/11/2019   BILITOT 0.3 08/11/2019   Lab Results  Component Value Date   HGBA1C 5.8 (H) 08/11/2019   HGBA1C 5.5 06/30/2018   HGBA1C 5.7 (H) 02/08/2018   Lab Results  Component Value Date   INSULIN 14.4 08/11/2019   INSULIN 4.8 06/30/2018   INSULIN 5.8 02/08/2018   Lab Results  Component Value Date   TSH 3.710 08/11/2019   Lab Results  Component Value Date   CHOL 166 02/08/2018   HDL 75 02/08/2018   LDLCALC 76 02/08/2018   TRIG 75 02/08/2018   CHOLHDL 2.2 02/08/2018   Lab Results  Component Value Date   WBC 11.2 (H) 08/24/2018   HGB 12.9 08/24/2018   HCT 40.3 08/24/2018   MCV 84.7 08/24/2018   PLT 471 (H) 08/24/2018     Obesity Behavioral Intervention Documentation for Insurance:   Approximately 15 minutes were spent on the discussion below.  ASK: We discussed the diagnosis of obesity with [name] today and [name] agreed to give [place] permission to discuss obesity behavioral modification therapy today.  ASSESS: [name] has the diagnosis of obesity and her BMI today is 60.94. [name] is in the action stage of change.   ADVISE: [name] was educated on the multiple health risks of obesity as well as the benefit of weight loss to improve her health. She was advised of the need for long term treatment and the importance of lifestyle modifications to improve her current health and to decrease her risk of future health problems.  AGREE: Multiple dietary modification options and treatment options were discussed and [name] agreed to follow the recommendations documented in the above note.  ARRANGE: [name] was educated on the importance of frequent visits to treat obesity as outlined per CMS and USPSTF guidelines and agreed to schedule her next follow up appointment today.  Attestation Statements:   Reviewed by clinician on day of visit: allergies, medications, problem list, medical history, surgical history, family history, social history, and previous  encounter notes.  I, [name], am acting as Location manager for [name], [name].  I have reviewed the above documentation for accuracy and completeness, and I agree with the above. -  Abigail Fick, FNP

## 2019-11-08 ENCOUNTER — Ambulatory Visit: Payer: Medicare Other | Attending: Orthopedic Surgery | Admitting: Physical Therapy

## 2019-11-08 ENCOUNTER — Encounter (INDEPENDENT_AMBULATORY_CARE_PROVIDER_SITE_OTHER): Payer: Self-pay | Admitting: Family Medicine

## 2019-11-08 ENCOUNTER — Encounter: Payer: Self-pay | Admitting: Physical Therapy

## 2019-11-08 ENCOUNTER — Other Ambulatory Visit: Payer: Self-pay

## 2019-11-08 DIAGNOSIS — R600 Localized edema: Secondary | ICD-10-CM | POA: Diagnosis present

## 2019-11-08 DIAGNOSIS — R262 Difficulty in walking, not elsewhere classified: Secondary | ICD-10-CM | POA: Diagnosis present

## 2019-11-08 DIAGNOSIS — M25572 Pain in left ankle and joints of left foot: Secondary | ICD-10-CM | POA: Insufficient documentation

## 2019-11-08 DIAGNOSIS — M25675 Stiffness of left foot, not elsewhere classified: Secondary | ICD-10-CM | POA: Diagnosis present

## 2019-11-08 NOTE — Therapy (Signed)
[facility]  [place] [address] [place], [place], [address] Phone: [phone]   Fax:  [phone]  Physical Therapy Evaluation  Patient Details  Name: [name] MRN: [id number] Date of Birth: Feb 17, 1956 Referring Provider (PT): Yobani Schertzer Boston, Alleen Borne MD   Encounter Date: 11/08/2019  PT End of Session - 11/08/19 1008    Visit Number  1    Date for PT Re-Evaluation  01/07/20    PT Start Time  0928    PT Stop Time  1010    PT Time Calculation (min)  42 min    Activity Tolerance  Patient tolerated treatment well    Behavior During Therapy  Anxious       Past Medical History:  Diagnosis Date  . Atrial fibrillation (HCC)   . Chronic lower back pain   . Depression   . DVT (deep venous thrombosis) (HCC) 1990s   LLE  . Fallen arches   . Hypertension   . Hyperthyroidism ~ 2000   "fine now" (04/23/2016)  . Joint pain   . Lactose intolerance   . Leg edema   . Obesity   . Pneumonia 1980s X 1  . Snoring    has not had sleep study but suspects sleep apnea    Past Surgical History:  Procedure Laterality Date  . CARDIOVERSION N/A 06/18/2016   Procedure: CARDIOVERSION;  Surgeon: Laurey Morale, MD;  Location[facility] ENDOSCOPY;  Service: Cardiovascular;  Laterality: N/A;  . CARDIOVERSION N/A 07/11/2016   Procedure: CARDIOVERSION;  Surgeon: Lewayne Bunting, MD;  Location: [facility] ENDOSCOPY;  Service: Cardiovascular;  Laterality: N/A;  . CARDIOVERSION N/A 07/02/2017   Procedure: CARDIOVERSION;  Surgeon: Thurmon Fair, MD;  Location: [facility] ENDOSCOPY;  Service: Cardiovascular;  Laterality: N/A;  . CARDIOVERSION N/A 09/29/2017   Procedure: CARDIOVERSION;  Surgeon: Laurey Morale, MD;  Location: [facility] ENDOSCOPY;  Service: Cardiovascular;  Laterality: N/A;  . ENDOMETRIAL ABLATION  ~2006  . TONSILLECTOMY  1960s  . TUBAL LIGATION  1979    There were no vitals filed for this visit.   Subjective Assessment - 11/08/19 0934     Subjective  Patient underwent a left ankle and foot surgery to correct "flat foot".  She reports that she had multiple complications, she had first surgery in June 2019.  She had a foot fracture and then sepsis.  She reports up to 6 surgeries due to complications.  She was non weight bearing for several months.  The original surgery was a triple arthrodesis, she then had osteomyelits had to have the hardware removed and then replaced, she also had some vascular issues and had to have surgery on this as well.  She comes in on a walker and in a boot.    How long can you stand comfortably?  5-10 minutes    How long can you walk comfortably?  100 feet    Patient Stated Goals  walk better, lose weight , use a cane or less device    Currently in Pain?  Yes    Pain Score  0-No pain    Pain Location  Ankle    Pain Orientation  Left    Pain Descriptors / Indicators  Aching;Sore    Pain Type  Acute pain;Surgical pain    Pain Onset  More than a month ago    Pain Frequency  Intermittent    Aggravating Factors   without boot and standing pain is 10/10    Pain  Relieving Factors  in boot, rest pain can be 0/10    Effect of Pain on Daily Activities  difficulty with all ADL's         [facility] PT Assessment - 11/08/19 0001      Assessment   Medical Diagnosis  left foot pain, difficulty walking    Referring Provider (PT)  Tam Johnella Moloney MD    Onset Date/Surgical Date  03/25/19    Prior Therapy  no      Precautions   Precautions  None      Restrictions   Weight Bearing Restrictions  No      Balance Screen   Has the patient fallen in the past 6 months  No    Has the patient had a decrease in activity level because of a fear of falling?   No    Is the patient reluctant to leave their home because of a fear of falling?   No      Home Environment   Additional Comments  has a ramp into the home, did housework      Prior Function   Level of Independence  Independent with household mobility  without device    Vocation  Full time employment    Armed forces training and education officer, for businesses    Leisure  no exercise      Posture/Postural Control   Posture Comments  very forward flexed trunk, rounded shoulders, stooped at the waist      ROM / Strength   AROM / PROM / Strength  PROM;AROM;Strength      AROM   Overall AROM Comments  toes are swollen and very stiff, very tender to touch    AROM Assessment Site  Ankle    Right/Left Ankle  Left    Left Ankle Dorsiflexion  0    Left Ankle Plantar Flexion  22    Left Ankle Inversion  0    Left Ankle Eversion  0      PROM   PROM Assessment Site  Ankle    Right/Left Ankle  Left    Left Ankle Dorsiflexion  0    Left Ankle Plantar Flexion  30    Left Ankle Inversion  0    Left Ankle Eversion  4      Strength   Strength Assessment Site  Ankle    Right/Left Ankle  Left    Left Ankle Dorsiflexion  3-/5    Left Ankle Plantar Flexion  3-/5    Left Ankle Inversion  1/5    Left Ankle Eversion  1/5      Palpation   Palpation comment  a very flat and swollen foot, still some eschar on the medial area due to the vascular surgery, she is very tender to palpation due to not really having weight bearing and a shoe on since 2019.      Ambulation/Gait   Gait Comments  uses a std walker, very slow, stooped posture, step to gait, in boot, the right ankle really rolls in, pronation      Standardized Balance Assessment   Standardized Balance Assessment  Timed Up and Go Test      Timed Up and Go Test   Normal TUG (seconds)  45    TUG Comments  use of a walker                Objective measurements completed on examination: See above findings.  PT Education - 11/08/19 1007    Education Details  HEP for PF/Df of the ankle, gentle toe stretches and ankle stretches all while sitting    Person(s) Educated  Patient    Methods  Explanation;Demonstration;Verbal cues;Handout    Comprehension  Verbalized  understanding       PT Short Term Goals - 11/08/19 1014      PT SHORT TERM GOAL #1   Title  independent with initial HEP    Time  2    Period  Weeks    Status  New        PT Long Term Goals - 11/08/19 1014      PT LONG TERM GOAL #1   Title  increase AROM of the left ankle DF to 5 degrees    Time  8    Period  Weeks    Status  New      PT LONG TERM GOAL #2   Title  decrease pain with standing 50%    Time  8    Period  Weeks    Status  New      PT LONG TERM GOAL #3   Title  walk with cane and shoe x 100 feet    Time  8    Period  Weeks    Status  New      PT LONG TERM GOAL #4   Title  be able to wear a shoe 10 hours a day    Time  8    Period  Weeks    Status  New             Plan - 11/08/19 1008    Clinical Impression Statement  Patient describes multiple foot and ankle surgeries starting in 2019.  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  She has truly been off of her feet since 2019, the original surgery was a triple arthrodesis due to severe ankle and foot pronation.  She is now in a boot and is WBAT on a walker with MD order to wan from boot.  She has limited AROM and PROM, the foot is swollen and tender to touch.  TUG time was 45 seconds    Personal Factors and Comorbidities  Comorbidity 3+    Comorbidities  afib, obesity, depression    Examination-Activity Limitations  Squat;Stairs;Lift;Locomotion Level;Stand;Caring for Others;Carry;Toileting;Transfers    Examination-Participation Restrictions  Shop;Volunteer;Cleaning;Community Activity;Yard Work    Merchant navy officer  Evolving/Moderate complexity    Clinical Decision Making  Moderate    Rehab Potential  Fair    PT Frequency  2x / week    PT Duration  8 weeks    PT Treatment/Interventions  ADLs/Self Care Home Management;Cryotherapy;Electrical Stimulation;Functional mobility training;Stair training;Gait  training;Therapeutic activities;Therapeutic exercise;Balance training;Neuromuscular re-education;Manual techniques;Patient/family education;Passive range of motion;Vasopneumatic Device    PT Next Visit Plan  Slowly try the nustep, try to do without the boot during the PT treatment, work on ROM and coordination, toe activities    Consulted and Agree with Plan of Care  Patient       Patient will benefit from skilled therapeutic intervention in order to improve the following deficits and impairments:  Abnormal gait, Pain, Postural dysfunction, Increased muscle spasms, Decreased mobility, Decreased coordination, Cardiopulmonary status limiting activity, Decreased activity tolerance, Decreased endurance, Decreased range of motion, Decreased strength, Impaired flexibility, Difficulty walking, Decreased balance  Visit Diagnosis: Pain in left ankle and joints  of left foot - Plan: PT plan of care cert/re-cert  Stiffness of left foot, not elsewhere classified - Plan: PT plan of care cert/re-cert  Difficulty in walking, not elsewhere classified - Plan: PT plan of care cert/re-cert  Localized edema - Plan: PT plan of care cert/re-cert     Problem List Patient Active Problem List   Diagnosis Date Noted  . Depression 09/26/2019  . Vitamin D deficiency 09/01/2019  . Class 3 severe obesity with serious comorbidity and body mass index (BMI) of 60.0 to 69.9 in adult (HCC) 09/01/2019  . Prediabetes 08/15/2019  . Atrial fibrillation (HCC)   . Chest pain 04/22/2016  . Hypokalemia 04/22/2016  . Morbid obesity (HCC) 04/22/2016  . Hypertension 04/22/2016  . Paroxysmal A-fib (HCC) 04/22/2016  . Hypomagnesemia 04/22/2016    Jearld Lesch., PT 11/08/2019, 10:18 AM  [facility] [address] [place], [place], [address] Phone: [phone]   Fax:  [phone]  Name: JOANI COSMA MRN: [id number] Date of Birth: 1956/01/22

## 2019-11-14 ENCOUNTER — Encounter: Payer: Self-pay | Admitting: Physical Therapy

## 2019-11-14 ENCOUNTER — Other Ambulatory Visit: Payer: Self-pay

## 2019-11-14 ENCOUNTER — Ambulatory Visit: Payer: Medicare Other | Admitting: Physical Therapy

## 2019-11-14 DIAGNOSIS — R600 Localized edema: Secondary | ICD-10-CM

## 2019-11-14 DIAGNOSIS — M25675 Stiffness of left foot, not elsewhere classified: Secondary | ICD-10-CM

## 2019-11-14 DIAGNOSIS — M25572 Pain in left ankle and joints of left foot: Secondary | ICD-10-CM

## 2019-11-14 DIAGNOSIS — R262 Difficulty in walking, not elsewhere classified: Secondary | ICD-10-CM

## 2019-11-14 NOTE — Therapy (Signed)
[facility] [place] [address] [place], [place], [address] Phone: [phone]   Fax:  [phone]  Physical Therapy Treatment  Patient Details  Name: DEA BITTING MRN: [id number] Date of Birth: [birth year] Referring Provider (PT): Michael Boston, Alleen Borne MD   Encounter Date: 11/14/2019  PT End of Session - 11/14/19 1432    Visit Number  2    Date for PT Re-Evaluation  01/07/20    PT Start Time  1345    PT Stop Time  1433    PT Time Calculation (min)  48 min    Activity Tolerance  Patient tolerated treatment well    Behavior During Therapy  [facility] for tasks assessed/performed;Anxious       Past Medical History:  Diagnosis Date  . Atrial fibrillation (HCC)   . Chronic lower back pain   . Depression   . DVT (deep venous thrombosis) (HCC) 1990s   LLE  . Fallen arches   . Hypertension   . Hyperthyroidism ~ 2000   "fine now" (04/23/2016)  . Joint pain   . Lactose intolerance   . Leg edema   . Obesity   . Pneumonia 1980s X 1  . Snoring    has not had sleep study but suspects sleep apnea    Past Surgical History:  Procedure Laterality Date  . CARDIOVERSION N/A 06/18/2016   Procedure: CARDIOVERSION;  Surgeon: Laurey Morale, MD;  Location[facility] ENDOSCOPY;  Service: Cardiovascular;  Laterality: N/A;  . CARDIOVERSION N/A 07/11/2016   Procedure: CARDIOVERSION;  Surgeon: Lewayne Bunting, MD;  Location: [facility] ENDOSCOPY;  Service: Cardiovascular;  Laterality: N/A;  . CARDIOVERSION N/A 07/02/2017   Procedure: CARDIOVERSION;  Surgeon: Thurmon Fair, MD;  Location: [facility] ENDOSCOPY;  Service: Cardiovascular;  Laterality: N/A;  . CARDIOVERSION N/A 09/29/2017   Procedure: CARDIOVERSION;  Surgeon: Laurey Morale, MD;  Location: [facility] ENDOSCOPY;  Service: Cardiovascular;  Laterality: N/A;  . ENDOMETRIAL ABLATION  ~2006  . TONSILLECTOMY  1960s  . TUBAL LIGATION  1979    There were no vitals filed for this visit.  Subjective Assessment  - 11/14/19 1349    Subjective  "Feeling fine right now"    Patient Stated Goals  walk better, lose weight , use a cane or less device    Currently in Pain?  No/denies                       [facility] Adult PT Treatment/Exercise - 11/14/19 0001      Ambulation/Gait   Ambulation/Gait  Yes    Ambulation Distance (Feet)  44 Feet    Assistive device  Straight cane    Gait Pattern  Step-to pattern    Ambulation Surface  Level;Indoor    Gait Comments  2nd trial HHA x 2      Exercises   Exercises  Ankle      Manual Therapy   Manual Therapy  Passive ROM    Passive ROM  L ankle all directions      Ankle Exercises: Aerobic   Nustep  L4 x 6 min       Ankle Exercises: Standing   Other Standing Ankle Exercises  Standing march wiht RW 2x10      Ankle Exercises: Seated   Other Seated Ankle Exercises  Sit fit all directions 2x10     Other Seated Ankle Exercises  Resisted PF/DF red 2x10, EV/IN AROM 2x10  PT Short Term Goals - 11/08/19 1014      PT SHORT TERM GOAL #1   Title  independent with initial HEP    Time  2    Period  Weeks    Status  New        PT Long Term Goals - 11/08/19 1014      PT LONG TERM GOAL #1   Title  increase AROM of the left ankle DF to 5 degrees    Time  8    Period  Weeks    Status  New      PT LONG TERM GOAL #2   Title  decrease pain with standing 50%    Time  8    Period  Weeks    Status  New      PT LONG TERM GOAL #3   Title  walk with cane and shoe x 100 feet    Time  8    Period  Weeks    Status  New      PT LONG TERM GOAL #4   Title  be able to wear a shoe 10 hours a day    Time  8    Period  Weeks    Status  New            Plan - 11/14/19 1433    Clinical Impression Statement  Pt tolerated an initial progression to TE well. Her L ankle motion ins really limited. Firm end point noted with inversion and eversion during MT. Some pain at the ball of foot noted with passive DF.  Progression with  ambulation with SPC and with HHA. She did well with weight shifts without AD.    Personal Factors and Comorbidities  Comorbidity 3+    Comorbidities  afib, obesity, depression    Examination-Activity Limitations  Squat;Stairs;Lift;Locomotion Level;Stand;Caring for Others;Carry;Toileting;Transfers    Examination-Participation Restrictions  Shop;Volunteer;Cleaning;Community Activity;Yard Work    Conservation officer, historic buildings  Evolving/Moderate complexity    Rehab Potential  Fair    PT Frequency  2x / week    PT Duration  8 weeks    PT Treatment/Interventions  ADLs/Self Care Home Management;Cryotherapy;Electrical Stimulation;Functional mobility training;Stair training;Gait training;Therapeutic activities;Therapeutic exercise;Balance training;Neuromuscular re-education;Manual techniques;Patient/family education;Passive range of motion;Vasopneumatic Device    PT Next Visit Plan  \nustep, try to do without the boot during the PT treatment, work on ROM and coordination, toe activities       Patient will benefit from skilled therapeutic intervention in order to improve the following deficits and impairments:  Abnormal gait, Pain, Postural dysfunction, Increased muscle spasms, Decreased mobility, Decreased coordination, Cardiopulmonary status limiting activity, Decreased activity tolerance, Decreased endurance, Decreased range of motion, Decreased strength, Impaired flexibility, Difficulty walking, Decreased balance  Visit Diagnosis: Stiffness of left foot, not elsewhere classified  Pain in left ankle and joints of left foot  Difficulty in walking, not elsewhere classified  Localized edema     Problem List Patient Active Problem List   Diagnosis Date Noted  . Depression 09/26/2019  . Vitamin D deficiency 09/01/2019  . Class 3 severe obesity with serious comorbidity and body mass index (BMI) of 60.0 to 69.9 in adult (HCC) 09/01/2019  . Prediabetes 08/15/2019  . Atrial fibrillation (HCC)    . Chest pain 04/22/2016  . Hypokalemia 04/22/2016  . Morbid obesity (HCC) 04/22/2016  . Hypertension 04/22/2016  . Paroxysmal A-fib (HCC) 04/22/2016  . Hypomagnesemia 04/22/2016    Grayce Sessions, PTA 11/14/2019, 2:38 PM  [facility]  [address], [place], [address] Phone: [phone]   Fax:  [phone]  Name: PORCHEA CHARRIER MRN: [id number] Date of Birth: 10/04/1956

## 2019-11-16 ENCOUNTER — Other Ambulatory Visit: Payer: Self-pay

## 2019-11-16 ENCOUNTER — Ambulatory Visit: Payer: Medicare Other | Admitting: Physical Therapy

## 2019-11-16 ENCOUNTER — Encounter: Payer: Self-pay | Admitting: Physical Therapy

## 2019-11-16 DIAGNOSIS — M25572 Pain in left ankle and joints of left foot: Secondary | ICD-10-CM

## 2019-11-16 DIAGNOSIS — R262 Difficulty in walking, not elsewhere classified: Secondary | ICD-10-CM

## 2019-11-16 DIAGNOSIS — M25675 Stiffness of left foot, not elsewhere classified: Secondary | ICD-10-CM

## 2019-11-16 DIAGNOSIS — R600 Localized edema: Secondary | ICD-10-CM

## 2019-11-16 NOTE — Therapy (Signed)
[facility] [address] [place], [place], [address] Phone: [phone]   Fax:  [phone]  Physical Therapy Treatment  Patient Details  Name: [name] MRN: [id number] Date of Birth: 24-May-1956 Referring Provider (PT): Michael Boston, Alleen Borne MD   Encounter Date: 11/16/2019  PT End of Session - 11/16/19 1429    Visit Number  3    Date for PT Re-Evaluation  01/07/20    PT Start Time  1353    PT Stop Time  1429    PT Time Calculation (min)  36 min    Activity Tolerance  Patient tolerated treatment well    Behavior During Therapy  [facility] for tasks assessed/performed;Anxious       Past Medical History:  Diagnosis Date  . Atrial fibrillation (HCC)   . Chronic lower back pain   . Depression   . DVT (deep venous thrombosis) (HCC) 1990s   LLE  . Fallen arches   . Hypertension   . Hyperthyroidism ~ 2000   "fine now" (04/23/2016)  . Joint pain   . Lactose intolerance   . Leg edema   . Obesity   . Pneumonia 1980s X 1  . Snoring    has not had sleep study but suspects sleep apnea    Past Surgical History:  Procedure Laterality Date  . CARDIOVERSION N/A 06/18/2016   Procedure: CARDIOVERSION;  Surgeon: Laurey Morale, MD;  Location: [facility] ENDOSCOPY;  Service: Cardiovascular;  Laterality: N/A;  . CARDIOVERSION N/A 07/11/2016   Procedure: CARDIOVERSION;  Surgeon: Lewayne Bunting, MD;  Location: [facility] ENDOSCOPY;  Service: Cardiovascular;  Laterality: N/A;  . CARDIOVERSION N/A 07/02/2017   Procedure: CARDIOVERSION;  Surgeon: Thurmon Fair, MD;  Location: [facility] ENDOSCOPY;  Service: Cardiovascular;  Laterality: N/A;  . CARDIOVERSION N/A 09/29/2017   Procedure: CARDIOVERSION;  Surgeon: Laurey Morale, MD;  Location: [facility] ENDOSCOPY;  Service: Cardiovascular;  Laterality: N/A;  . ENDOMETRIAL ABLATION  ~2006  . TONSILLECTOMY  1960s  . TUBAL LIGATION  1979    There were no vitals filed for this visit.  Subjective Assessment  - 11/16/19 1357    Subjective  "I am feeling all right"    Currently in Pain?  No/denies                       [facility] Adult PT Treatment/Exercise - 11/16/19 0001      Ambulation/Gait   Ambulation/Gait  Yes    Ambulation Distance (Feet)  100 Feet    Assistive device  Straight cane    Gait Pattern  Step-to pattern    Ambulation Surface  Level;Indoor    Gait Comments  broken into two trials, Then 30 ft HHAx2      Manual Therapy   Manual Therapy  Passive ROM    Passive ROM  L ankle all directions      Ankle Exercises: Aerobic   Nustep  L4 x 6 min       Ankle Exercises: Seated   Other Seated Ankle Exercises  Sit fit all directions 2x10     Other Seated Ankle Exercises  Resisted PF/DF red 2x10, EV/IN AROM 2x10       Ankle Exercises: Standing   Other Standing Ankle Exercises  Standing march wiht RW 2x10               PT Short Term Goals - 11/08/19 1014      PT SHORT TERM  GOAL #1   Title  independent with initial HEP    Time  2    Period  Weeks    Status  New        PT Long Term Goals - 11/08/19 1014      PT LONG TERM GOAL #1   Title  increase AROM of the left ankle DF to 5 degrees    Time  8    Period  Weeks    Status  New      PT LONG TERM GOAL #2   Title  decrease pain with standing 50%    Time  8    Period  Weeks    Status  New      PT LONG TERM GOAL #3   Title  walk with cane and shoe x 100 feet    Time  8    Period  Weeks    Status  New      PT LONG TERM GOAL #4   Title  be able to wear a shoe 10 hours a day    Time  8    Period  Weeks    Status  New            Plan - 11/16/19 1430    Clinical Impression Statement  Pt ~ 8 min late for today's session. No reports of pain. L ankle motion is very limited. Little ROM again resistance with PF & DF. Pt required assist with eversion / inversion. She was able to increase gait distance with SPC.    Comorbidities  afib, obesity, depression    Examination-Activity Limitations   Squat;Stairs;Lift;Locomotion Level;Stand;Caring for Others;Carry;Toileting;Transfers    Examination-Participation Restrictions  Shop;Volunteer;Cleaning;Community Activity;Yard Work    Merchant [employer] officer  Evolving/Moderate complexity    Rehab Potential  Fair    PT Frequency  2x / week    PT Duration  8 weeks    PT Treatment/Interventions  ADLs/Self Care Home Management;Cryotherapy;Electrical Stimulation;Functional mobility training;Stair training;Gait training;Therapeutic activities;Therapeutic exercise;Balance training;Neuromuscular re-education;Manual techniques;Patient/family education;Passive range of motion;Vasopneumatic Device    PT Next Visit Plan  without the boot during the PT treatment, work on ROM and coordination, toe activities       Patient will benefit from skilled therapeutic intervention in order to improve the following deficits and impairments:  Abnormal gait, Pain, Postural dysfunction, Increased muscle spasms, Decreased mobility, Decreased coordination, Cardiopulmonary status limiting activity, Decreased activity tolerance, Decreased endurance, Decreased range of motion, Decreased strength, Impaired flexibility, Difficulty walking, Decreased balance  Visit Diagnosis: Stiffness of left foot, not elsewhere classified  Pain in left ankle and joints of left foot  Difficulty in walking, not elsewhere classified  Localized edema     Problem List Patient Active Problem List   Diagnosis Date Noted  . Depression 09/26/2019  . Vitamin D deficiency 09/01/2019  . Class 3 severe obesity with serious comorbidity and body mass index (BMI) of 60.0 to 69.9 in adult ([place]) 09/01/2019  . Prediabetes 08/15/2019  . Atrial fibrillation ([place])   . Chest pain 04/22/2016  . Hypokalemia 04/22/2016  . Morbid obesity ([place]) 04/22/2016  . Hypertension 04/22/2016  . Paroxysmal A-fib ([place]) 04/22/2016  . Hypomagnesemia 04/22/2016    Scot Jun, PTA 11/16/2019, 2:39  PM  [address] [place], [place], [address] Phone: [phone]   Fax:  [phone]  Name: [name] MRN: [id number] Date of Birth: [birth year]

## 2019-11-17 ENCOUNTER — Encounter (INDEPENDENT_AMBULATORY_CARE_PROVIDER_SITE_OTHER): Payer: Self-pay | Admitting: Family Medicine

## 2019-11-17 ENCOUNTER — Ambulatory Visit (INDEPENDENT_AMBULATORY_CARE_PROVIDER_SITE_OTHER): Payer: Medicare Other | Admitting: Family Medicine

## 2019-11-17 ENCOUNTER — Ambulatory Visit (INDEPENDENT_AMBULATORY_CARE_PROVIDER_SITE_OTHER): Payer: Medicare Other | Admitting: Psychology

## 2019-11-17 VITALS — BP 128/79 | HR 83 | Temp 98.1°F | Ht 63.0 in | Wt 342.0 lb

## 2019-11-17 DIAGNOSIS — Z6841 Body Mass Index (BMI) 40.0 and over, adult: Secondary | ICD-10-CM

## 2019-11-17 DIAGNOSIS — R0602 Shortness of breath: Secondary | ICD-10-CM

## 2019-11-17 DIAGNOSIS — F3289 Other specified depressive episodes: Secondary | ICD-10-CM

## 2019-11-17 DIAGNOSIS — E559 Vitamin D deficiency, unspecified: Secondary | ICD-10-CM | POA: Diagnosis not present

## 2019-11-17 DIAGNOSIS — R7303 Prediabetes: Secondary | ICD-10-CM | POA: Diagnosis not present

## 2019-11-17 MED ORDER — VITAMIN D (ERGOCALCIFEROL) 1.25 MG (50000 UNIT) PO CAPS: 50000.0000 [IU] | ORAL_CAPSULE | ORAL | 0 refills | Status: DC

## 2019-11-17 MED ORDER — BUPROPION HCL ER (SR) 150 MG PO TB12: 150.0000 mg | ORAL_TABLET | Freq: Every day | ORAL | 0 refills | Status: DC

## 2019-11-17 NOTE — Progress Notes (Signed)
Chief Complaint:   OBESITY Alveta is here to discuss her progress with her obesity treatment plan along with follow-up of her obesity related diagnoses. [place] is on keeping a food journal and adhering to recommended goals of 1500 calories and 90 grams of protein and states she is following her eating plan approximately 90% of the time. Brighid states she is doing physical therapy for 30 minutes 2 times per week.  Today's visit was #: 15 Starting weight: 296 lbs  Starting date: 02/08/2018 Today's weight: 342 lbs Today's date: 11/17/2019 Total lbs lost to date: 0 Total lbs lost since last in-office visit: 2 lbs  Interim History: Cristie sometimes skips breakfast due having to go to doctor's appointments in am.  She is sticking to the plan very well otherwise.  She is journaling daily.  She meets protein goals most days.  Subjective:   1. Vitamin D deficiency Candis's Vitamin D level was 42.7 on 08/11/2019.  This is not at goal.  She is currently taking vit D. She denies nausea, vomiting or muscle weakness.  2. Shortness of breath on exertion Taelyn has had increased shortness of breath recently and wheezing.  She is being evaluated by Cardiology.  3. Prediabetes Odell has a diagnosis of prediabetes based on her elevated HgA1c and was informed this puts her at greater risk of developing diabetes. She continues to work on diet and exercise to decrease her risk of diabetes. She denies nausea or hypoglycemia.  She is not on metformin.  Denies polyphagia.  Lab Results  Component Value Date   HGBA1C 5.8 (H) 08/11/2019   Lab Results  Component Value Date   INSULIN 14.4 08/11/2019   INSULIN 4.8 06/30/2018   INSULIN 5.8 02/08/2018   4. Other depression, with emotional eating Rakiyah is seeing Dr. Dewaine Conger but may not be able to continue due to cost.  She feels it is beneficial to see Dr. Dewaine Conger.  She is using strategies to avoid stress eating.  No suicidal or homicidal ideation.  5.   COVID counseling She is doing all measures currently - handwashing, wearing mask, social distancing.  Assessment/Plan:   1. Vitamin D deficiency Low Vitamin D level contributes to fatigue and are associated with obesity, breast, and colon cancer. She agrees to continue to take prescription Vitamin D @50 ,000 IU every week and will follow-up for routine testing of Vitamin D, at least 2-3 times per year to avoid over-replacement. - VITAMIN D 25 Hydroxy (Vit-D Deficiency, Fractures) - Vitamin D, Ergocalciferol, (DRISDOL) 1.25 MG (50000 UNIT) CAPS capsule; Take 1 capsule (50,000 Units total) by mouth every 7 (seven) days.  Dispense: 4 capsule; Refill: 0  2. Shortness of breath on exertion Patient will follow-up with Cardiology.  3. Prediabetes Myeshia will continue to work on weight loss, exercise, and decreasing simple carbohydrates to help decrease the risk of diabetes.  - Hemoglobin A1c - Insulin, random - Comprehensive metabolic panel  4. Other depression, with emotional eating Aaron should continue to see Dr. Rinaldo Cloud if she is able.  Continue bupropion. - buPROPion (WELLBUTRIN SR) 150 MG 12 hr tablet; Take 1 tablet (150 mg total) by mouth daily.  Dispense: 30 tablet; Refill: 0  5. COVID counseling Encouraged her to get the vaccine.  Continue above measures.  We will continue to monitor. Orders and follow up as documented in patient record.  Counseling  COVID-19 is a respiratory infection that is caused by a virus. It can cause serious infections, such as pneumonia, acute  respiratory distress syndrome, acute respiratory failure, or sepsis.  You are more likely to develop a serious illness if you are 31 years of age or older, have a weak immune system, live in a nursing home, have chronic disease, or have obesity.  Get vaccinated as soon as they are available to you.  For our most current information, please visit [email or web address].  Wash your hands often with soap and  water for 20 seconds. If soap and water are not available, use alcohol-based hand sanitizer.  Wear a face mask. Make sure your mask covers your nose and mouth.  Maintain at least 6 feet distance from others when in public.  Get help right away if  You have trouble breathing, chest pain, confusion, or other concerning symptoms.  6. Class 3 severe obesity with serious comorbidity and body mass index (BMI) of 60.0 to 69.9 in adult, unspecified obesity type [facility]) Safaa is currently in the action stage of change. As such, her goal is to continue with weight loss efforts. She has agreed to keeping a food journal and adhering to recommended goals of 1500 calories and 90 grams of protein daily.  May have protein shake for breakfast if she is unable to eat breakfast but I encouraged her to eat a meal if possible.  Exercise goals: PT 2 times a week.  Behavioral modification strategies: emotional eating strategies and planning for success.  Jacquilyn has agreed to follow-up with our clinic in 2 weeks. She was informed of the importance of frequent follow-up visits to maximize her success with intensive lifestyle modifications for her multiple health conditions.   Thais was informed we would discuss her lab results at her next visit unless there is a critical issue that needs to be addressed sooner. Lihanna agreed to keep her next visit at the agreed upon time to discuss these results.  Objective:   Blood pressure 128/79, pulse 83, temperature 98.1 F (36.7 C), temperature source Oral, height 5\' 3"  (1.6 m), weight (!) 342 lb (155.1 kg), SpO2 97 %. Body mass index is 60.58 kg/m.  General: Cooperative, alert, well developed, in no acute distress. HEENT: Conjunctivae and lids unremarkable. Cardiovascular: Regular rhythm.  Lungs: Normal work of breathing. Neurologic: No focal deficits.   Lab Results  Component Value Date   CREATININE 0.88 08/11/2019   BUN 33 (H) 08/11/2019   NA 137 08/11/2019    K 4.6 08/11/2019   CL 103 08/11/2019   CO2 19 (L) 08/11/2019   Lab Results  Component Value Date   ALT 9 08/11/2019   AST 13 08/11/2019   ALKPHOS 80 08/11/2019   BILITOT 0.3 08/11/2019   Lab Results  Component Value Date   HGBA1C 5.8 (H) 08/11/2019   HGBA1C 5.5 06/30/2018   HGBA1C 5.7 (H) 02/08/2018   Lab Results  Component Value Date   INSULIN 14.4 08/11/2019   INSULIN 4.8 06/30/2018   INSULIN 5.8 02/08/2018   Lab Results  Component Value Date   TSH 3.710 08/11/2019   Lab Results  Component Value Date   CHOL 166 02/08/2018   HDL 75 02/08/2018   LDLCALC 76 02/08/2018   TRIG 75 02/08/2018   CHOLHDL 2.2 02/08/2018   Lab Results  Component Value Date   WBC 11.2 (H) 08/24/2018   HGB 12.9 08/24/2018   HCT 40.3 08/24/2018   MCV 84.7 08/24/2018   PLT 471 (H) 08/24/2018   Obesity Behavioral Intervention Documentation for Insurance:   Approximately 15 minutes were spent on the discussion below.  ASK: We discussed the diagnosis of obesity with Rinaldo Cloud today and Daenerys agreed to give [place] permission to discuss obesity behavioral modification therapy today.  ASSESS: Merryn has the diagnosis of obesity and her BMI today is 60.7. Mckennah is in the action stage of change.   ADVISE: Jalyssa was educated on the multiple health risks of obesity as well as the benefit of weight loss to improve her health. She was advised of the need for long term treatment and the importance of lifestyle modifications to improve her current health and to decrease her risk of future health problems.  AGREE: Multiple dietary modification options and treatment options were discussed and Latarshia agreed to follow the recommendations documented in the above note.  ARRANGE: Clio was educated on the importance of frequent visits to treat obesity as outlined per CMS and USPSTF guidelines and agreed to schedule her next follow up appointment today.  Attestation Statements:   Reviewed by clinician on  day of visit: allergies, medications, problem list, medical history, surgical history, family history, social history, and previous encounter notes.  I, Insurance claims handler, CMA, am acting as Energy manager for [name], FNP-C.  I have reviewed the above documentation for accuracy and completeness, and I agree with the above. - Jesse Sans, FNP

## 2019-11-18 ENCOUNTER — Encounter (INDEPENDENT_AMBULATORY_CARE_PROVIDER_SITE_OTHER): Payer: Self-pay | Admitting: Family Medicine

## 2019-11-18 LAB — COMPREHENSIVE METABOLIC PANEL
ALT: 11 IU/L (ref 0–32)
AST: 18 IU/L (ref 0–40)
Albumin/Globulin Ratio: 2 (ref 1.2–2.2)
Albumin: 4.5 g/dL (ref 3.8–4.8)
Alkaline Phosphatase: 56 IU/L (ref 39–117)
BUN/Creatinine Ratio: 30 — ABNORMAL HIGH (ref 12–28)
BUN: 25 mg/dL (ref 8–27)
Bilirubin Total: 0.3 mg/dL (ref 0.0–1.2)
CO2: 21 mmol/L (ref 20–29)
Calcium: 9.6 mg/dL (ref 8.7–10.3)
Chloride: 104 mmol/L (ref 96–106)
Creatinine, Ser: 0.83 mg/dL (ref 0.57–1.00)
GFR calc Af Amer: 88 mL/min/{1.73_m2} (ref >59)
GFR calc non Af Amer: 76 mL/min/{1.73_m2} (ref >59)
Globulin, Total: 2.3 g/dL (ref 1.5–4.5)
Glucose: 79 mg/dL (ref 65–99)
Potassium: 4.6 mmol/L (ref 3.5–5.2)
Sodium: 144 mmol/L (ref 134–144)
Total Protein: 6.8 g/dL (ref 6.0–8.5)

## 2019-11-18 LAB — HEMOGLOBIN A1C
Est. average glucose Bld gHb Est-mCnc: 114 mg/dL
Hgb A1c MFr Bld: 5.6 % (ref 4.8–5.6)

## 2019-11-18 LAB — INSULIN, RANDOM: INSULIN: 9.2 u[IU]/mL (ref 2.6–24.9)

## 2019-11-18 LAB — VITAMIN D 25 HYDROXY (VIT D DEFICIENCY, FRACTURES): Vit D, 25-Hydroxy: 48.1 ng/mL (ref 30.0–100.0)

## 2019-11-21 ENCOUNTER — Ambulatory Visit: Payer: Medicare Other | Admitting: Physical Therapy

## 2019-11-21 ENCOUNTER — Encounter: Payer: Self-pay | Admitting: Physical Therapy

## 2019-11-21 ENCOUNTER — Other Ambulatory Visit: Payer: Self-pay

## 2019-11-21 DIAGNOSIS — R262 Difficulty in walking, not elsewhere classified: Secondary | ICD-10-CM

## 2019-11-21 DIAGNOSIS — M25572 Pain in left ankle and joints of left foot: Secondary | ICD-10-CM

## 2019-11-21 DIAGNOSIS — M25675 Stiffness of left foot, not elsewhere classified: Secondary | ICD-10-CM

## 2019-11-21 DIAGNOSIS — R600 Localized edema: Secondary | ICD-10-CM

## 2019-11-21 NOTE — Therapy (Signed)
[address] [place], [place], [address] Phone: [phone]   Fax:  [phone]  Physical Therapy Treatment  Patient Details  Name: [name] MRN: [id number] Date of Birth: 1956-06-18 Referring Provider (PT): Fayette Pho, Fords Cellar MD   Encounter Date: 11/21/2019  PT End of Session - 11/21/19 1424    Visit Number  4    Date for PT Re-Evaluation  01/07/20    PT Start Time  1342    PT Stop Time  1425    PT Time Calculation (min)  43 min    Activity Tolerance  Patient tolerated treatment well    Behavior During Therapy  [facility] for tasks assessed/performed;Anxious       Past Medical History:  Diagnosis Date  . Atrial fibrillation ([place])   . Chronic lower back pain   . Depression   . DVT (deep venous thrombosis) (HCC) 1990s   LLE  . Fallen arches   . Hypertension   . Hyperthyroidism ~ 2000   "fine now" (04/23/2016)  . Joint pain   . Lactose intolerance   . Leg edema   . Obesity   . Pneumonia 1980s X 1  . Snoring    has not had sleep study but suspects sleep apnea    Past Surgical History:  Procedure Laterality Date  . CARDIOVERSION N/A 06/18/2016   Procedure: CARDIOVERSION;  Surgeon: Larey Dresser, MD;  Location: [place];  Service: Cardiovascular;  Laterality: N/A;  . CARDIOVERSION N/A 07/11/2016   Procedure: CARDIOVERSION;  Surgeon: Lelon Perla, MD;  Location: [facility] ENDOSCOPY;  Service: Cardiovascular;  Laterality: N/A;  . CARDIOVERSION N/A 07/02/2017   Procedure: CARDIOVERSION;  Surgeon: Sanda Klein, MD;  Location: [place] ENDOSCOPY;  Service: Cardiovascular;  Laterality: N/A;  . CARDIOVERSION N/A 09/29/2017   Procedure: CARDIOVERSION;  Surgeon: Larey Dresser, MD;  Location: [facility] ENDOSCOPY;  Service: Cardiovascular;  Laterality: N/A;  . ENDOMETRIAL ABLATION  ~2006  . TONSILLECTOMY  1960s  . TUBAL LIGATION  1979    There were no vitals filed for this visit.  Subjective Assessment  - 11/21/19 1342    Subjective  "I can tell I had a few days off" "Tired"    Currently in Pain?  No/denies                       [facility] Adult PT Treatment/Exercise - 11/21/19 0001      Ambulation/Gait   Ambulation/Gait  Yes    Ambulation Distance (Feet)  240 Feet    Assistive device  Straight cane    Gait Pattern  Step-to pattern    Ambulation Surface  Level;Indoor    Gait Comments  broken into 4 trials with seated rest in  between       Manual Therapy   Manual Therapy  Passive ROM    Passive ROM  L ankle all directions      Ankle Exercises: Aerobic   Nustep  L3 x 6 min       Ankle Exercises: Standing   Other Standing Ankle Exercises  LLE 4 in step ups with walker 2x10     Other Standing Ankle Exercises  Standing march wiht RW 2x10      Ankle Exercises: Seated   Other Seated Ankle Exercises  Sit to stand from  mat table 2x10     Other Seated Ankle Exercises  Resisted PF/DF red 2x10, EV/IN AROM 2x10  PT Short Term Goals - 11/21/19 1423      PT SHORT TERM GOAL #1   Title  independent with initial HEP    Status  Achieved        PT Long Term Goals - 11/21/19 1424      PT LONG TERM GOAL #1   Status  On-going      PT LONG TERM GOAL #2   Title  decrease pain with standing 50%    Status  Partially Met      PT LONG TERM GOAL #3   Title  walk with cane and shoe x 100 feet    Status  Partially Met      PT LONG TERM GOAL #4   Title  be able to wear a shoe 10 hours a day    Status  On-going            Plan - 11/21/19 1425    Clinical Impression Statement  Pt has progressed towards goals. Her L ankle is very weak needing assist with inversion and Eversion. She did well ambulating with SPC but fatigues quick. Cues not to use UE a lot with step ups. Some pain reported with MT    Personal Factors and Comorbidities  Comorbidity 3+    Comorbidities  afib, obesity, depression    Examination-Activity Limitations   Squat;Stairs;Lift;Locomotion Level;Stand;Caring for Others;Carry;Toileting;Transfers    Examination-Participation Restrictions  Shop;Volunteer;Cleaning;Community Activity;Yard Work    [employer]chant [employer] officer  Evolving/Moderate complexity    Rehab Potential  Fair    PT Frequency  2x / week    PT Duration  8 weeks    PT Treatment/Interventions  ADLs/Self Care Home Management;Cryotherapy;Electrical Stimulation;Functional mobility training;Stair training;Gait training;Therapeutic activities;Therapeutic exercise;Balance training;Neuromuscular re-education;Manual techniques;Patient/family education;Passive range of motion;Vasopneumatic Device    PT Next Visit Plan  without the boot during the PT treatment, work on ROM and coordination, toe activities       Patient will benefit from skilled therapeutic intervention in order to improve the following deficits and impairments:  Abnormal gait, Pain, Postural dysfunction, Increased muscle spasms, Decreased mobility, Decreased coordination, Cardiopulmonary status limiting activity, Decreased activity tolerance, Decreased endurance, Decreased range of motion, Decreased strength, Impaired flexibility, Difficulty walking, Decreased balance  Visit Diagnosis: Stiffness of left foot, not elsewhere classified  Pain in left ankle and joints of left foot  Difficulty in walking, not elsewhere classified  Localized edema     Problem List Patient Active Problem List   Diagnosis Date Noted  . Depression 09/26/2019  . Vitamin D deficiency 09/01/2019  . Class 3 severe obesity with serious comorbidity and body mass index (BMI) of 60.0 to 69.9 in adult ([place]) 09/01/2019  . Prediabetes 08/15/2019  . Atrial fibrillation ([place])   . Chest pain 04/22/2016  . Hypokalemia 04/22/2016  . Morbid obesity ([place]) 04/22/2016  . Hypertension 04/22/2016  . Paroxysmal A-fib ([place]) 04/22/2016  . Hypomagnesemia 04/22/2016    Scot Jun, PTA 11/21/2019, 2:30  PM  [address] [place], [place], [address] Phone: [phone]   Fax:  [phone]  Name: [name] MRN: [id number] Date of Birth: 10/25/1956

## 2019-11-23 ENCOUNTER — Other Ambulatory Visit (HOSPITAL_COMMUNITY): Payer: Self-pay | Admitting: Nurse Practitioner

## 2019-11-24 ENCOUNTER — Ambulatory Visit: Payer: Medicare Other | Admitting: Physical Therapy

## 2019-11-28 ENCOUNTER — Ambulatory Visit: Payer: Medicare Other | Attending: Orthopedic Surgery | Admitting: Physical Therapy

## 2019-11-28 ENCOUNTER — Other Ambulatory Visit: Payer: Self-pay

## 2019-11-28 ENCOUNTER — Encounter: Payer: Self-pay | Admitting: Physical Therapy

## 2019-11-28 DIAGNOSIS — M25572 Pain in left ankle and joints of left foot: Secondary | ICD-10-CM | POA: Insufficient documentation

## 2019-11-28 DIAGNOSIS — R262 Difficulty in walking, not elsewhere classified: Secondary | ICD-10-CM | POA: Diagnosis present

## 2019-11-28 DIAGNOSIS — R600 Localized edema: Secondary | ICD-10-CM | POA: Insufficient documentation

## 2019-11-28 DIAGNOSIS — M25675 Stiffness of left foot, not elsewhere classified: Secondary | ICD-10-CM | POA: Insufficient documentation

## 2019-11-28 NOTE — Therapy (Signed)
[place], [place], [address] Phone: [phone]   Fax:  [phone]  Physical Therapy Treatment  Patient Details  Name: [name] MRN: [id number] Date of Birth: [birth year] Referring Provider (PT): Fayette Pho,  Cellar MD   Encounter Date: 11/28/2019  PT End of Session - 11/28/19 1428    Visit Number  5    Date for PT Re-Evaluation  01/07/20    PT Start Time  5053    PT Stop Time  1429    PT Time Calculation (min)  44 min    Activity Tolerance  Patient tolerated treatment well    Behavior During Therapy  [facility] for tasks assessed/performed;Anxious       Past Medical History:  Diagnosis Date  . Atrial fibrillation ([place])   . Chronic lower back pain   . Depression   . DVT (deep venous thrombosis) (HCC) 1990s   LLE  . Fallen arches   . Hypertension   . Hyperthyroidism ~ 2000   "fine now" (04/23/2016)  . Joint pain   . Lactose intolerance   . Leg edema   . Obesity   . Pneumonia 1980s X 1  . Snoring    has not had sleep study but suspects sleep apnea    Past Surgical History:  Procedure Laterality Date  . CARDIOVERSION N/A 06/18/2016   Procedure: CARDIOVERSION;  Surgeon: Larey Dresser, MD;  Location: [facility];  Service: Cardiovascular;  Laterality: N/A;  . CARDIOVERSION N/A 07/11/2016   Procedure: CARDIOVERSION;  Surgeon: Lelon Perla, MD;  Location: [facility] ENDOSCOPY;  Service: Cardiovascular;  Laterality: N/A;  . CARDIOVERSION N/A 07/02/2017   Procedure: CARDIOVERSION;  Surgeon: Sanda Klein, MD;  Location: [facility] ENDOSCOPY;  Service: Cardiovascular;  Laterality: N/A;  . CARDIOVERSION N/A 09/29/2017   Procedure: CARDIOVERSION;  Surgeon: Larey Dresser, MD;  Location: [facility] ENDOSCOPY;  Service: Cardiovascular;  Laterality: N/A;  . ENDOMETRIAL ABLATION  ~2006  . TONSILLECTOMY  1960s  . TUBAL LIGATION  1979    There were no vitals filed for this visit.  Subjective Assessment  - 11/28/19 1349    Subjective  Pt ambulated in with walker and no BOOT. Pt in ankle brace and shoe    Currently in Pain?  No/denies                       [facility] Adult PT Treatment/Exercise - 11/28/19 0001      Ambulation/Gait   Ambulation/Gait  Yes    Ambulation Distance (Feet)  180 Feet    Assistive device  Straight cane    Gait Pattern  Step-to pattern;Decreased step length - right    Ambulation Surface  Level;Indoor    Gait Comments  broken into 3 trials with seated rest in  between       High Level Balance   High Level Balance Activities  Side stepping    High Level Balance Comments  HHA x 1       Ankle Exercises: Aerobic   Elliptical  I1 R10 40 seconds     Nustep  L4 x 6 min       Ankle Exercises: Seated   Other Seated Ankle Exercises  Sit to stand from  mat table x10     Other Seated Ankle Exercises  PF blue Tband 2x10, DF red 2x15      Ankle Exercises: Standing   Other Standing Ankle Exercises  LLE  4 in step ups with walker 2x10     Other Standing Ankle Exercises  Standing march SPC 2x10                PT Short Term Goals - 11/21/19 1423      PT SHORT TERM GOAL #1   Title  independent with initial HEP    Status  Achieved        PT Long Term Goals - 11/28/19 1428      PT LONG TERM GOAL #1   Title  increase AROM of the left ankle DF to 5 degrees    Status  On-going      PT LONG TERM GOAL #2   Title  decrease pain with standing 50%    Status  Partially Met      PT LONG TERM GOAL #3   Title  walk with cane and shoe x 100 feet    Status  Partially Met      PT LONG TERM GOAL #4   Title  be able to wear a shoe 10 hours a day    Status  On-going            Plan - 11/28/19 1429    Clinical Impression Statement  Pt tolerated a progressed treatment well. Progressed to elliptical warm up for 40 seconds. Side step performed with HHA X2. Pt does fatigue with functional interventions. L ankle is very weak with dorsi flexion,  inversion, and eversion.    Personal Factors and Comorbidities  Comorbidity 3+    Comorbidities  afib, obesity, depression    Examination-Participation Restrictions  Shop;Volunteer;Cleaning;Community Activity;Yard Work    [employer]chant [employer] officer  Evolving/Moderate complexity    Rehab Potential  Fair    PT Frequency  2x / week    PT Next Visit Plan  without the boot during the PT treatment, work on ROM and coordination, toe activities       Patient will benefit from skilled therapeutic intervention in order to improve the following deficits and impairments:  Abnormal gait, Pain, Postural dysfunction, Increased muscle spasms, Decreased mobility, Decreased coordination, Cardiopulmonary status limiting activity, Decreased activity tolerance, Decreased endurance, Decreased range of motion, Decreased strength, Impaired flexibility, Difficulty walking, Decreased balance  Visit Diagnosis: Pain in left ankle and joints of left foot  Difficulty in walking, not elsewhere classified  Localized edema  Stiffness of left foot, not elsewhere classified     Problem List Patient Active Problem List   Diagnosis Date Noted  . Depression 09/26/2019  . Vitamin D deficiency 09/01/2019  . Class 3 severe obesity with serious comorbidity and body mass index (BMI) of 60.0 to 69.9 in adult ([place]) 09/01/2019  . Prediabetes 08/15/2019  . Atrial fibrillation ([place])   . Chest pain 04/22/2016  . Hypokalemia 04/22/2016  . Morbid obesity ([place]) 04/22/2016  . Hypertension 04/22/2016  . Paroxysmal A-fib ([place]) 04/22/2016  . Hypomagnesemia 04/22/2016    Scot Jun 11/28/2019, 2:31 PM  [address] [place], [place], [address] Phone: [phone]   Fax:  [phone]  Name: [name] MRN: [id number] Date of Birth: 1956/07/16

## 2019-11-30 ENCOUNTER — Encounter: Payer: Medicare Other | Admitting: Physical Therapy

## 2019-12-01 ENCOUNTER — Ambulatory Visit (INDEPENDENT_AMBULATORY_CARE_PROVIDER_SITE_OTHER): Payer: Medicare Other | Admitting: Family Medicine

## 2019-12-01 ENCOUNTER — Encounter (INDEPENDENT_AMBULATORY_CARE_PROVIDER_SITE_OTHER): Payer: Self-pay | Admitting: Family Medicine

## 2019-12-01 ENCOUNTER — Other Ambulatory Visit: Payer: Self-pay

## 2019-12-01 VITALS — BP 141/83 | HR 91 | Temp 98.3°F | Ht 63.0 in | Wt 340.0 lb

## 2019-12-01 DIAGNOSIS — Z6841 Body Mass Index (BMI) 40.0 and over, adult: Secondary | ICD-10-CM | POA: Diagnosis not present

## 2019-12-01 DIAGNOSIS — F3289 Other specified depressive episodes: Secondary | ICD-10-CM | POA: Diagnosis not present

## 2019-12-01 DIAGNOSIS — R7303 Prediabetes: Secondary | ICD-10-CM

## 2019-12-01 DIAGNOSIS — E559 Vitamin D deficiency, unspecified: Secondary | ICD-10-CM

## 2019-12-01 NOTE — Progress Notes (Unsigned)
Office: [phone]  /  Fax: [phone]    Date: December 15, 2019   Appointment Start Time: *** Duration: *** minutes Provider: Lawerance Cruel, Psy.D. Type of Session: Individual Therapy  Location of Patient: {gbptloc:23249} Location of Provider: {Location of Service:22491} Type of Contact: Telepsychological Visit via {gbtelepsych:23399}  Session Content: [name] is a 64 y.o. female presenting via {gbtelepsych:23399} for a follow-up appointment to address the previously established treatment goal of decreasing emotional eating. Today's appointment was a telepsychological visit due to COVID-19. [name] provided verbal consent for today's telepsychological appointment and she is aware she is responsible for securing confidentiality on her end of the session. Prior to proceeding with today's appointment, [name]'s physical location at the time of this appointment was obtained as well a phone number she could be reached at in the event of technical difficulties. [name] and this provider participated in today's telepsychological service.   This provider conducted a brief check-in and verbally administered the PHQ-9 and GAD-7. *** [name] was receptive to today's appointment as evidenced by openness to sharing, responsiveness to feedback, and {gbreceptiveness:23401}.  Mental Status Examination:  Appearance: {Appearance:22431} Behavior: {Behavior:22445} Mood: {gbmood:21757} Affect: {Affect:22436} Speech: {Speech:22432} Eye Contact: {Eye Contact:22433} Psychomotor Activity: {Motor Activity:22434} Gait: {gbgait:23404} Thought Process: {thought process:22448}  Thought Content/Perception: {disturbances:22451} Orientation: {Orientation:22437} Memory/Concentration: {gbcognition:22449} Insight/Judgment: {Insight:22446}  Structured Assessments Results: The Patient Health Questionnaire-9 (PHQ-9) is a self-report measure that assesses symptoms and severity of depression over the course of the last  two weeks. [name] obtained a score of *** suggesting {GBPHQ9SEVERITY:21752}. [name] finds the endorsed symptoms to be {gbphq9difficulty:21754}. [0= Not at all; 1= Several days; 2= More than half the days; 3= Nearly every day] Little interest or pleasure in doing things ***  Feeling down, depressed, or hopeless ***  Trouble falling or staying asleep, or sleeping too much ***  Feeling tired or having little energy ***  Poor appetite or overeating ***  Feeling bad about yourself --- or that you are a failure or have let yourself or your family down ***  Trouble concentrating on things, such as reading the newspaper or watching television ***  Moving or speaking so slowly that other people could have noticed? Or the opposite --- being so fidgety or restless that you have been moving around a lot more than usual ***  Thoughts that you would be better off dead or hurting yourself in some way ***  PHQ-9 Score ***    The Generalized Anxiety Disorder-7 (GAD-7) is a brief self-report measure that assesses symptoms of anxiety over the course of the last two weeks. [name] obtained a score of *** suggesting {gbgad7severity:21753}. [name] finds the endorsed symptoms to be {gbphq9difficulty:21754}. [0= Not at all; 1= Several days; 2= Over half the days; 3= Nearly every day] Feeling nervous, anxious, on edge ***  Not being able to stop or control worrying ***  Worrying too much about different things ***  Trouble relaxing ***  Being so restless that it's hard to sit still ***  Becoming easily annoyed or irritable ***  Feeling afraid as if something awful might happen ***  GAD-7 Score ***   Interventions:  {Interventions for Progress Notes:23405}  DSM-5 Diagnosis: 311 (F32.8) Other Specified Depressive Disorder, Emotional Eating Behaviors  Treatment Goal & Progress: During the initial appointment with this provider, the following treatment goal was established: decrease emotional eating. [name] has  demonstrated progress in her goal as evidenced by {gbtxprogress:22839}. [name] also {gbtxprogress2:22951}.  Plan: The next appointment will be scheduled in {gbweeks:21758}, which will  be {gbtxmodality:23402}. The next session will focus on {Plan for Next Appointment:23400}.

## 2019-12-05 ENCOUNTER — Encounter (INDEPENDENT_AMBULATORY_CARE_PROVIDER_SITE_OTHER): Payer: Self-pay | Admitting: Family Medicine

## 2019-12-05 ENCOUNTER — Other Ambulatory Visit: Payer: Self-pay

## 2019-12-05 ENCOUNTER — Ambulatory Visit: Payer: Medicare Other | Admitting: Physical Therapy

## 2019-12-05 ENCOUNTER — Encounter: Payer: Self-pay | Admitting: Physical Therapy

## 2019-12-05 DIAGNOSIS — M25572 Pain in left ankle and joints of left foot: Secondary | ICD-10-CM | POA: Diagnosis not present

## 2019-12-05 DIAGNOSIS — R600 Localized edema: Secondary | ICD-10-CM

## 2019-12-05 DIAGNOSIS — R262 Difficulty in walking, not elsewhere classified: Secondary | ICD-10-CM

## 2019-12-05 DIAGNOSIS — M25675 Stiffness of left foot, not elsewhere classified: Secondary | ICD-10-CM

## 2019-12-05 NOTE — Progress Notes (Signed)
Chief Complaint:   OBESITY [name] is here to discuss her progress with her obesity treatment plan along with follow-up of her obesity related diagnoses. [name] is on keeping a food journal and adhering to recommended goals of 1500 calories and 90 grams of protein daily and states she is following her eating plan approximately 90% of the time. [name] states she is doing physical therapy and on the elliptical for 30-45 minutes 1 time per week.  Today's visit was #: 16 Starting weight: 296 lbs Starting date: 02/08/18 Today's weight: 340 lbs Today's date: 12/01/2019 Total lbs lost to date: 0 Total lbs lost since last in-office visit: 2  Interim History: [name] is journaling very consistently. She meets her protein goals most days but she struggles with this.  Subjective:   1. Vitamin D deficiency [name]'s Vit D is nearly at goal (48.1). She is on weekly Rx Vit D.  2. Pre-diabetes [name]'s A1c has decreased to 5.6 from 5.8. She denies polyphagia.  3. Other depression, with emotional eating [name] feels bupropion is helping curtail emotional eating. She notes dry mouth with bupropion.  Assessment/Plan:   1. Vitamin D deficiency Low Vitamin D level contributes to fatigue and are associated with obesity, breast, and colon cancer. Arlet agreed to continue taking prescription Vitamin D 50,000 IU every week and will follow-up for routine testing of Vitamin D, at least 2-3 times per year to avoid over-replacement.  2. Pre-diabetes [name] will continue her meal plan, and she will continue to work on weight loss, exercise, and decreasing simple carbohydrates to help decrease the risk of diabetes.   3. Other depression, with emotional eating Behavior modification techniques were discussed today to help [name] deal with her emotional/non-hunger eating behaviors. [name] agreed to continue taking bupropion. Orders and follow up as documented in patient record.   4. Class 3 severe obesity  with serious comorbidity and body mass index (BMI) of 60.0 to 69.9 in adult, unspecified obesity type [name]) [name] is currently in the action stage of change. As such, her goal is to continue with weight loss efforts. She has agreed to keeping a food journal and adhering to recommended goals of 1500 calories and 90 grams of protein daily.   We discussed breakfast options such as [name] oatmeal with [name] milk..  Exercise goals: [name] is to continue physical therapy 2 times per week.  Behavioral modification strategies: increasing lean protein intake, increasing vegetables, meal planning and cooking strategies and planning for success.  [name] has agreed to follow-up with our clinic in 2 weeks. She was informed of the importance of frequent follow-up visits to maximize her success with intensive lifestyle modifications for her multiple health conditions.   Objective:   Blood pressure (!) 141/83, pulse 91, temperature 98.3 F (36.8 C), temperature source Oral, height 5\' 3"  (1.6 m), weight (!) 340 lb (154.2 kg), SpO2 90 %. Body mass index is 60.23 kg/m.  General: Cooperative, alert, well developed, in no acute distress. HEENT: Conjunctivae and lids unremarkable. Cardiovascular: Regular rhythm.  Lungs: Normal work of breathing. Neurologic: No focal deficits.   Lab Results  Component Value Date   CREATININE 0.83 11/17/2019   BUN 25 11/17/2019   NA 144 11/17/2019   K 4.6 11/17/2019   CL 104 11/17/2019   CO2 21 11/17/2019   Lab Results  Component Value Date   ALT 11 11/17/2019   AST 18 11/17/2019   ALKPHOS 56 11/17/2019   BILITOT 0.3 11/17/2019   Lab Results  Component Value Date   HGBA1C 5.6 11/17/2019   HGBA1C 5.8 (H) 08/11/2019   HGBA1C 5.5 06/30/2018   HGBA1C 5.7 (H) 02/08/2018   Lab Results  Component Value Date   INSULIN 9.2 11/17/2019   INSULIN 14.4 08/11/2019   INSULIN 4.8 06/30/2018   INSULIN 5.8 02/08/2018   Lab Results  Component Value Date   TSH 3.710  08/11/2019   Lab Results  Component Value Date   CHOL 166 02/08/2018   HDL 75 02/08/2018   LDLCALC 76 02/08/2018   TRIG 75 02/08/2018   CHOLHDL 2.2 02/08/2018   Lab Results  Component Value Date   WBC 11.2 (H) 08/24/2018   HGB 12.9 08/24/2018   HCT 40.3 08/24/2018   MCV 84.7 08/24/2018   PLT 471 (H) 08/24/2018   No results found for: IRON, TIBC, FERRITIN  Obesity Behavioral Intervention Documentation for Insurance:   Approximately 15 minutes were spent on the discussion below.  ASK: We discussed the diagnosis of obesity with [name] today and [name] agreed to give [place] permission to discuss obesity behavioral modification therapy today.  ASSESS: [name] has the diagnosis of obesity and her BMI today is 60.24. [name] is in the action stage of change.   ADVISE: [name] was educated on the multiple health risks of obesity as well as the benefit of weight loss to improve her health. She was advised of the need for long term treatment and the importance of lifestyle modifications to improve her current health and to decrease her risk of future health problems.  AGREE: Multiple dietary modification options and treatment options were discussed and [name] agreed to follow the recommendations documented in the above note.  ARRANGE: [name] was educated on the importance of frequent visits to treat obesity as outlined per CMS and USPSTF guidelines and agreed to schedule her next follow up appointment today.  Attestation Statements:   Reviewed by clinician on day of visit: allergies, medications, problem list, medical history, surgical history, family history, social history, and previous encounter notes.   [name], am acting as Location manager for [employer], FNP-C.  I have reviewed the above documentation for accuracy and completeness, and I agree with the above. -  Georgianne Fick, FNP

## 2019-12-05 NOTE — Therapy (Signed)
[place], [place], [address] Phone: [phone]   Fax:  [phone]  Physical Therapy Treatment  Patient Details  Name: [name] MRN: [id number] Date of Birth: Mar 14, 1956 Referring Provider (PT): Fayette Pho, [place] Cellar MD   Encounter Date: 12/05/2019  PT End of Session - 12/05/19 1559    Visit Number  6    Date for PT Re-Evaluation  01/07/20    PT Start Time  3151    PT Stop Time  1600    PT Time Calculation (min)  45 min       Past Medical History:  Diagnosis Date  . Atrial fibrillation ([place])   . Chronic lower back pain   . Depression   . DVT (deep venous thrombosis) (HCC) 1990s   LLE  . Fallen arches   . Hypertension   . Hyperthyroidism ~ 2000   "fine now" (04/23/2016)  . Joint pain   . Lactose intolerance   . Leg edema   . Obesity   . Pneumonia 1980s X 1  . Snoring    has not had sleep study but suspects sleep apnea    Past Surgical History:  Procedure Laterality Date  . CARDIOVERSION N/A 06/18/2016   Procedure: CARDIOVERSION;  Surgeon: Larey Dresser, MD;  Location: [facility];  Service: Cardiovascular;  Laterality: N/A;  . CARDIOVERSION N/A 07/11/2016   Procedure: CARDIOVERSION;  Surgeon: Lelon Perla, MD;  Location: [facility] ENDOSCOPY;  Service: Cardiovascular;  Laterality: N/A;  . CARDIOVERSION N/A 07/02/2017   Procedure: CARDIOVERSION;  Surgeon: Sanda Klein, MD;  Location: [facility] ENDOSCOPY;  Service: Cardiovascular;  Laterality: N/A;  . CARDIOVERSION N/A 09/29/2017   Procedure: CARDIOVERSION;  Surgeon: Larey Dresser, MD;  Location: [facility] ENDOSCOPY;  Service: Cardiovascular;  Laterality: N/A;  . ENDOMETRIAL ABLATION  ~2006  . TONSILLECTOMY  1960s  . TUBAL LIGATION  1979    There were no vitals filed for this visit.  Subjective Assessment - 12/05/19 1516    Subjective  Kept wearing the shoe for two days, some lateral foot discomfort that went away    Currently  in Pain?  No/denies                       [facility] Adult PT Treatment/Exercise - 12/05/19 0001      Ambulation/Gait   Ambulation/Gait  Yes    Ambulation Distance (Feet)  240 Feet    Assistive device  Straight cane    Gait Pattern  Step-to pattern;Decreased step length - right    Ambulation Surface  Level;Indoor    Gait Comments  broken into 3 trials with seated rest in  between       Ankle Exercises: Aerobic   Elliptical  I1 R20 x1:40    Nustep  L4 x 6 min       Ankle Exercises: Stretches   Other Stretch  HS curls blue ;tband 2x15    Other Stretch  3lb 2x15       Ankle Exercises: Seated   Other Seated Ankle Exercises  Sit to stand from  mat table x10       Ankle Exercises: Standing   Other Standing Ankle Exercises  LLE hip flex 3lb 2x10     Other Standing Ankle Exercises  Alt box taps with SPC 2x5                PT Short Term Goals - 11/21/19  [place] #1   Title  independent with initial HEP    Status  Achieved        PT Long Term Goals - 11/28/19 1428      PT LONG TERM GOAL #1   Title  increase AROM of the left ankle DF to 5 degrees    Status  On-going      PT LONG TERM GOAL #2   Title  decrease pain with standing 50%    Status  Partially Met      PT LONG TERM GOAL #3   Title  walk with cane and shoe x 100 feet    Status  Partially Met      PT LONG TERM GOAL #4   Title  be able to wear a shoe 10 hours a day    Status  On-going            Plan - 12/05/19 1601    Clinical Impression Statement  Pt able to increase time on the elliptical, she reports that it helped her last time. Sh was able to increase her singe fait distance to one hundred twenty feet without rest. Reports difficult flexing L hip, some compensation noted with standing hip flex with HHA x2. No reports fo pain. Fatigue quick with functional interventions.    Comorbidities  afib, obesity, depression    Examination-Activity Limitations   Squat;Stairs;Lift;Locomotion Level;Stand;Caring for Others;Carry;Toileting;Transfers    Examination-Participation Restrictions  Shop;Volunteer;Cleaning;Community Activity;Yard Work    [employer]chant [employer] officer  Evolving/Moderate complexity    Rehab Potential  Fair    PT Frequency  2x / week    PT Duration  8 weeks    PT Treatment/Interventions  ADLs/Self Care Home Management;Cryotherapy;Electrical Stimulation;Functional mobility training;Stair training;Gait training;Therapeutic activities;Therapeutic exercise;Balance training;Neuromuscular re-education;Manual techniques;Patient/family education;Passive range of motion;Vasopneumatic Device    PT Next Visit Plan  without the boot during the PT treatment, work on ROM and coordination, toe activities       Patient will benefit from skilled therapeutic intervention in order to improve the following deficits and impairments:  Abnormal gait, Pain, Postural dysfunction, Increased muscle spasms, Decreased mobility, Decreased coordination, Cardiopulmonary status limiting activity, Decreased activity tolerance, Decreased endurance, Decreased range of motion, Decreased strength, Impaired flexibility, Difficulty walking, Decreased balance  Visit Diagnosis: Localized edema  Stiffness of left foot, not elsewhere classified  Difficulty in walking, not elsewhere classified  Pain in left ankle and joints of left foot     Problem List Patient Active Problem List   Diagnosis Date Noted  . Depression 09/26/2019  . Vitamin D deficiency 09/01/2019  . Class 3 severe obesity with serious comorbidity and body mass index (BMI) of 60.0 to 69.9 in adult ([place]) 09/01/2019  . Prediabetes 08/15/2019  . Atrial fibrillation ([place])   . Chest pain 04/22/2016  . Hypokalemia 04/22/2016  . Morbid obesity ([place]) 04/22/2016  . Hypertension 04/22/2016  . Paroxysmal A-fib ([place]) 04/22/2016  . Hypomagnesemia 04/22/2016    Scot Jun, PTA 12/05/2019, 4:03  PM  [address] [place], [place], [address] Phone: [phone]   Fax:  [phone]  Name: JOLETTA MANNER MRN: [id number] Date of Birth: 11/30/1955

## 2019-12-08 ENCOUNTER — Other Ambulatory Visit: Payer: Self-pay

## 2019-12-08 ENCOUNTER — Encounter: Payer: Self-pay | Admitting: Physical Therapy

## 2019-12-08 ENCOUNTER — Ambulatory Visit: Payer: Medicare Other | Admitting: Physical Therapy

## 2019-12-08 DIAGNOSIS — M25675 Stiffness of left foot, not elsewhere classified: Secondary | ICD-10-CM

## 2019-12-08 DIAGNOSIS — M25572 Pain in left ankle and joints of left foot: Secondary | ICD-10-CM

## 2019-12-08 DIAGNOSIS — R600 Localized edema: Secondary | ICD-10-CM

## 2019-12-08 DIAGNOSIS — R262 Difficulty in walking, not elsewhere classified: Secondary | ICD-10-CM

## 2019-12-08 NOTE — Therapy (Signed)
[place], [place], [address] Phone: [phone]   Fax:  [phone]  Physical Therapy Treatment  Patient Details  Name: [name] MRN: [id number] Date of Birth: 03/26/1956 Referring Provider (PT): Fayette Pho, [place] Cellar MD   Encounter Date: 12/08/2019  PT End of Session - 12/08/19 1226    Visit Number  7    Date for PT Re-Evaluation  01/07/20    PT Start Time  8638    PT Stop Time  1227    PT Time Calculation (min)  42 min    Activity Tolerance  Patient tolerated treatment well    Behavior During Therapy  [facility] for tasks assessed/performed;Anxious       Past Medical History:  Diagnosis Date  . Atrial fibrillation ([place])   . Chronic lower back pain   . Depression   . DVT (deep venous thrombosis) (HCC) 1990s   LLE  . Fallen arches   . Hypertension   . Hyperthyroidism ~ 2000   "fine now" (04/23/2016)  . Joint pain   . Lactose intolerance   . Leg edema   . Obesity   . Pneumonia 1980s X 1  . Snoring    has not had sleep study but suspects sleep apnea    Past Surgical History:  Procedure Laterality Date  . CARDIOVERSION N/A 06/18/2016   Procedure: CARDIOVERSION;  Surgeon: Larey Dresser, MD;  Location: [place];  Service: Cardiovascular;  Laterality: N/A;  . CARDIOVERSION N/A 07/11/2016   Procedure: CARDIOVERSION;  Surgeon: Lelon Perla, MD;  Location: [facility] ENDOSCOPY;  Service: Cardiovascular;  Laterality: N/A;  . CARDIOVERSION N/A 07/02/2017   Procedure: CARDIOVERSION;  Surgeon: Sanda Klein, MD;  Location: [place] ENDOSCOPY;  Service: Cardiovascular;  Laterality: N/A;  . CARDIOVERSION N/A 09/29/2017   Procedure: CARDIOVERSION;  Surgeon: Larey Dresser, MD;  Location: [facility] ENDOSCOPY;  Service: Cardiovascular;  Laterality: N/A;  . ENDOMETRIAL ABLATION  ~2006  . TONSILLECTOMY  1960s  . TUBAL LIGATION  1979    There were no vitals filed for this visit.  Subjective Assessment  - 12/08/19 1146    Subjective  "I am ok"    Currently in Pain?  No/denies         [facility] PT Assessment - 12/08/19 0001      AROM   Left Ankle Dorsiflexion  4    Left Ankle Plantar Flexion  31    Left Ankle Inversion  1    Left Ankle Eversion  4                   [facility] Adult PT Treatment/Exercise - 12/08/19 0001      Ambulation/Gait   Ambulation/Gait  Yes    Ambulation Distance (Feet)  200 Feet    Assistive device  Straight cane    Gait Pattern  Step-to pattern;Decreased step length - right    Ambulation Surface  Level;Indoor    Gait Comments  broken into 2 trials with seated rest in  between       High Level Balance   High Level Balance Activities  Side stepping    High Level Balance Comments  HHA x 2       Ankle Exercises: Aerobic   Elliptical  I1 R20 x 2 min one rest break    Nustep  L4 x 6 min       Ankle Exercises: Seated   Other Seated Ankle Exercises  Green Tband LLE PF x 15      Ankle Exercises: Stretches   Other Stretch  HS curls blue ;tband 2x15    Other Stretch  LAQ 2x15 3lb       Ankle Exercises: Standing   Other Standing Ankle Exercises  LLE hip flex 3lb 2x5 SPC in RUE     Other Standing Ankle Exercises  Alt box taps 4in with SPC 2x5                PT Short Term Goals - 11/21/19 1423      PT SHORT TERM GOAL #1   Title  independent with initial HEP    Status  Achieved        PT Long Term Goals - 11/28/19 1428      PT LONG TERM GOAL #1   Title  increase AROM of the left ankle DF to 5 degrees    Status  On-going      PT LONG TERM GOAL #2   Title  decrease pain with standing 50%    Status  Partially Met      PT LONG TERM GOAL #3   Title  walk with cane and shoe x 100 feet    Status  Partially Met      PT LONG TERM GOAL #4   Title  be able to wear a shoe 10 hours a day    Status  On-going            Plan - 12/08/19 1227    Clinical Impression Statement  Pt has progressed increasing her L ankle AROM some. She  did have some  fear ans instability with side step, more so when going got her R pushing off L leg. She becomes fatigue with ambulation. She was able to increase hr elliptical tolerance with one rest break.    Comorbidities  afib, obesity, depression    Examination-Activity Limitations  Squat;Stairs;Lift;Locomotion Level;Stand;Caring for Others;Carry;Toileting;Transfers    Examination-Participation Restrictions  Shop;Volunteer;Cleaning;Community Activity;Yard Work    [employer] officer  Evolving/Moderate complexity    Rehab Potential  Fair    PT Duration  8 weeks    PT Treatment/Interventions  ADLs/Self Care Home Management;Cryotherapy;Electrical Stimulation;Functional mobility training;Stair training;Gait training;Therapeutic activities;Therapeutic exercise;Balance training;Neuromuscular re-education;Manual techniques;Patient/family education;Passive range of motion;Vasopneumatic Device    PT Next Visit Plan  without the boot during the PT treatment, work on ROM and coordination, toe activities       Patient will benefit from skilled therapeutic intervention in order to improve the following deficits and impairments:  Abnormal gait, Pain, Postural dysfunction, Increased muscle spasms, Decreased mobility, Decreased coordination, Cardiopulmonary status limiting activity, Decreased activity tolerance, Decreased endurance, Decreased range of motion, Decreased strength, Impaired flexibility, Difficulty walking, Decreased balance  Visit Diagnosis: Stiffness of left foot, not elsewhere classified  Localized edema  Difficulty in walking, not elsewhere classified  Pain in left ankle and joints of left foot     Problem List Patient Active Problem List   Diagnosis Date Noted  . Depression 09/26/2019  . Vitamin D deficiency 09/01/2019  . Class 3 severe obesity with serious comorbidity and body mass index (BMI) of 60.0 to 69.9 in adult ([place]) 09/01/2019  . Prediabetes 08/15/2019   . Atrial fibrillation ([place])   . Chest pain 04/22/2016  . Hypokalemia 04/22/2016  . Morbid obesity ([place]) 04/22/2016  . Hypertension 04/22/2016  . Paroxysmal A-fib ([place]) 04/22/2016  . Hypomagnesemia 04/22/2016    Scot Jun, PTA 12/08/2019, 12:35 PM  [address] [place], [place], [address] Phone: [phone]   Fax:  [phone]  Name: ANTHONY ROLAND MRN: [id number] Date of Birth: 14-Mar-1956

## 2019-12-12 ENCOUNTER — Ambulatory Visit: Payer: Medicare Other | Admitting: Physical Therapy

## 2019-12-12 ENCOUNTER — Other Ambulatory Visit: Payer: Self-pay

## 2019-12-12 ENCOUNTER — Encounter: Payer: Self-pay | Admitting: Physical Therapy

## 2019-12-12 DIAGNOSIS — M25675 Stiffness of left foot, not elsewhere classified: Secondary | ICD-10-CM

## 2019-12-12 DIAGNOSIS — R600 Localized edema: Secondary | ICD-10-CM

## 2019-12-12 DIAGNOSIS — M25572 Pain in left ankle and joints of left foot: Secondary | ICD-10-CM | POA: Diagnosis not present

## 2019-12-12 NOTE — Therapy (Signed)
[place], [place], [address] Phone: [phone]   Fax:  [phone]  Physical Therapy Treatment  Patient Details  Name: [name] MRN: [id number] Date of Birth: January 06, 1956 Referring Provider (PT): Fayette Pho, [place] Cellar MD   Encounter Date: 12/12/2019  PT End of Session - 12/12/19 1555    Visit Number  8    Date for PT Re-Evaluation  01/07/20    PT Start Time  2836    PT Stop Time  1600    PT Time Calculation (min)  45 min    Activity Tolerance  Patient tolerated treatment well    Behavior During Therapy  [facility] for tasks assessed/performed;Anxious       Past Medical History:  Diagnosis Date  . Atrial fibrillation ([place])   . Chronic lower back pain   . Depression   . DVT (deep venous thrombosis) (HCC) 1990s   LLE  . Fallen arches   . Hypertension   . Hyperthyroidism ~ 2000   "fine now" (04/23/2016)  . Joint pain   . Lactose intolerance   . Leg edema   . Obesity   . Pneumonia 1980s X 1  . Snoring    has not had sleep study but suspects sleep apnea    Past Surgical History:  Procedure Laterality Date  . CARDIOVERSION N/A 06/18/2016   Procedure: CARDIOVERSION;  Surgeon: Larey Dresser, MD;  Location: [place];  Service: Cardiovascular;  Laterality: N/A;  . CARDIOVERSION N/A 07/11/2016   Procedure: CARDIOVERSION;  Surgeon: Lelon Perla, MD;  Location[facility] ENDOSCOPY;  Service: Cardiovascular;  Laterality: N/A;  . CARDIOVERSION N/A 07/02/2017   Procedure: CARDIOVERSION;  Surgeon: Sanda Klein, MD;  Location: [place] ENDOSCOPY;  Service: Cardiovascular;  Laterality: N/A;  . CARDIOVERSION N/A 09/29/2017   Procedure: CARDIOVERSION;  Surgeon: Larey Dresser, MD;  Location: [facility] ENDOSCOPY;  Service: Cardiovascular;  Laterality: N/A;  . ENDOMETRIAL ABLATION  ~2006  . TONSILLECTOMY  1960s  . TUBAL LIGATION  1979    There were no vitals filed for this visit.  Subjective Assessment  - 12/12/19 1513    Subjective  Leg was weak in the tennis shoes after last session. No pain today.    Currently in Pain?  No/denies         [facility] PT Assessment - 12/12/19 0001      AROM   Left Ankle Dorsiflexion  4    Left Ankle Plantar Flexion  31    Left Ankle Inversion  1    Left Ankle Eversion  4                   [facility] Adult PT Treatment/Exercise - 12/12/19 0001      Ambulation/Gait   Ambulation/Gait  Yes    Ambulation Distance (Feet)  120 Feet    Assistive device  Straight cane    Gait Pattern  Step-to pattern;Decreased step length - right    Ambulation Surface  Level;Indoor    Gait Comments  Gait without AD 83f  slow step through gait.       High Level Balance   High Level Balance Activities  Side stepping;Negotiating over obstacles    High Level Balance Comments  HHA x 2, Over Tbands with SPC       Ankle Exercises: Aerobic   Elliptical  I1 R10 x 2 min one rest break    Nustep  L4 x 6 min  Ankle Exercises: Standing   Other Standing Ankle Exercises  Alt taps on foam roll with SPC 2x10       Ankle Exercises: Seated   Other Seated Ankle Exercises  LLE PF blue band 2x15    Other Seated Ankle Exercises  Blue Tband HS curls 2x15               PT Short Term Goals - 11/21/19 1423      PT SHORT TERM GOAL #1   Title  independent with initial HEP    Status  Achieved        PT Long Term Goals - 11/28/19 1428      PT LONG TERM GOAL #1   Title  increase AROM of the left ankle DF to 5 degrees    Status  On-going      PT LONG TERM GOAL #2   Title  decrease pain with standing 50%    Status  Partially Met      PT LONG TERM GOAL #3   Title  walk with cane and shoe x 100 feet    Status  Partially Met      PT LONG TERM GOAL #4   Title  be able to wear a shoe 10 hours a day    Status  On-going            Plan - 12/12/19 1555    Clinical Impression Statement  Pt was able to progress to gait without AD, very slow cadence and some  uncertainty. She does well with ambulation utilizing SPC. Hand held assist needed with sids step. She becomes fatigue with activity needing rest between sets. No reports of pain. Some instability with alternating taps on foam roll.    Personal Factors and Comorbidities  Comorbidity 3+    Comorbidities  afib, obesity, depression    Examination-Activity Limitations  Squat;Stairs;Lift;Locomotion Level;Stand;Caring for Others;Carry;Toileting;Transfers    Examination-Participation Restrictions  Shop;Volunteer;Cleaning;Community Activity;Yard Work    [employer]chant [employer] officer  Evolving/Moderate complexity    Rehab Potential  Fair    PT Frequency  2x / week    PT Duration  8 weeks    PT Treatment/Interventions  ADLs/Self Care Home Management;Cryotherapy;Electrical Stimulation;Functional mobility training;Stair training;Gait training;Therapeutic activities;Therapeutic exercise;Balance training;Neuromuscular re-education;Manual techniques;Patient/family education;Passive range of motion;Vasopneumatic Device    PT Next Visit Plan  without the boot during the PT treatment, work on ROM and coordination, toe activities       Patient will benefit from skilled therapeutic intervention in order to improve the following deficits and impairments:  Abnormal gait, Pain, Postural dysfunction, Increased muscle spasms, Decreased mobility, Decreased coordination, Cardiopulmonary status limiting activity, Decreased activity tolerance, Decreased endurance, Decreased range of motion, Decreased strength, Impaired flexibility, Difficulty walking, Decreased balance  Visit Diagnosis: Localized edema  Stiffness of left foot, not elsewhere classified     Problem List Patient Active Problem List   Diagnosis Date Noted  . Depression 09/26/2019  . Vitamin D deficiency 09/01/2019  . Class 3 severe obesity with serious comorbidity and body mass index (BMI) of 60.0 to 69.9 in adult ([place]) 09/01/2019  . Prediabetes  08/15/2019  . Atrial fibrillation ([place])   . Chest pain 04/22/2016  . Hypokalemia 04/22/2016  . Morbid obesity ([place]) 04/22/2016  . Hypertension 04/22/2016  . Paroxysmal A-fib ([place]) 04/22/2016  . Hypomagnesemia 04/22/2016    Scot Jun, PTA 12/12/2019, 4:05 PM  [place], [place], [address]  Phone: [phone]   Fax:  [phone]  Name: ELIZABETHANNE LUSHER MRN: [id number] Date of Birth: 06/26/1956

## 2019-12-14 ENCOUNTER — Encounter: Payer: Medicare Other | Admitting: Physical Therapy

## 2019-12-15 ENCOUNTER — Ambulatory Visit (INDEPENDENT_AMBULATORY_CARE_PROVIDER_SITE_OTHER): Payer: Medicare Other | Admitting: Psychology

## 2019-12-15 ENCOUNTER — Ambulatory Visit (INDEPENDENT_AMBULATORY_CARE_PROVIDER_SITE_OTHER): Payer: Medicare Other | Admitting: Family Medicine

## 2019-12-19 ENCOUNTER — Other Ambulatory Visit: Payer: Self-pay

## 2019-12-19 ENCOUNTER — Ambulatory Visit: Payer: Medicare Other | Admitting: Physical Therapy

## 2019-12-19 ENCOUNTER — Encounter: Payer: Self-pay | Admitting: Physical Therapy

## 2019-12-19 DIAGNOSIS — R262 Difficulty in walking, not elsewhere classified: Secondary | ICD-10-CM

## 2019-12-19 DIAGNOSIS — M25572 Pain in left ankle and joints of left foot: Secondary | ICD-10-CM

## 2019-12-19 DIAGNOSIS — R600 Localized edema: Secondary | ICD-10-CM

## 2019-12-19 DIAGNOSIS — M25675 Stiffness of left foot, not elsewhere classified: Secondary | ICD-10-CM

## 2019-12-19 NOTE — Therapy (Signed)
[place], [place], [address] Phone: [phone]   Fax:  [phone]  Physical Therapy Treatment  Patient Details  Name: SARIKA BALDINI MRN: [id number] Date of Birth: 03-22-1956 Referring Provider (PT): Fayette Pho, [place] Cellar MD   Encounter Date: 12/19/2019  PT End of Session - 12/19/19 1559    Visit Number  9    Date for PT Re-Evaluation  01/07/20    PT Start Time  9892    PT Stop Time  1600    PT Time Calculation (min)  45 min    Activity Tolerance  Patient tolerated treatment well    Behavior During Therapy  [facility] for tasks assessed/performed;Anxious       Past Medical History:  Diagnosis Date  . Atrial fibrillation ([place])   . Chronic lower back pain   . Depression   . DVT (deep venous thrombosis) (HCC) 1990s   LLE  . Fallen arches   . Hypertension   . Hyperthyroidism ~ 2000   "fine now" (04/23/2016)  . Joint pain   . Lactose intolerance   . Leg edema   . Obesity   . Pneumonia 1980s X 1  . Snoring    has not had sleep study but suspects sleep apnea    Past Surgical History:  Procedure Laterality Date  . CARDIOVERSION N/A 06/18/2016   Procedure: CARDIOVERSION;  Surgeon: Larey Dresser, MD;  Location: [facility];  Service: Cardiovascular;  Laterality: N/A;  . CARDIOVERSION N/A 07/11/2016   Procedure: CARDIOVERSION;  Surgeon: Lelon Perla, MD;  Location: [facility] ENDOSCOPY;  Service: Cardiovascular;  Laterality: N/A;  . CARDIOVERSION N/A 07/02/2017   Procedure: CARDIOVERSION;  Surgeon: Sanda Klein, MD;  Location: [place] ENDOSCOPY;  Service: Cardiovascular;  Laterality: N/A;  . CARDIOVERSION N/A 09/29/2017   Procedure: CARDIOVERSION;  Surgeon: Larey Dresser, MD;  Location: [facility] ENDOSCOPY;  Service: Cardiovascular;  Laterality: N/A;  . ENDOMETRIAL ABLATION  ~2006  . TONSILLECTOMY  1960s  . TUBAL LIGATION  1979    There were no vitals filed for this visit.  Subjective Assessment  - 12/19/19 1523    Subjective  Pt reports that MD was pleased with her progress. Needs to continue building muscles around ankle    Currently in Pain?  No/denies                       [facility] Adult PT Treatment/Exercise - 12/19/19 0001      Ambulation/Gait   Ambulation/Gait  Yes    Ambulation Distance (Feet)  120 Feet    Assistive device  Straight cane    Gait Pattern  Step-to pattern;Decreased step length - right    Ambulation Surface  Level;Indoor      High Level Balance   High Level Balance Activities  Side stepping;Backward walking    High Level Balance Comments  HHAx2       Ankle Exercises: Aerobic   Elliptical  I1 R8 x 2 min one rest break   used resistance to control speed    Nustep  L5 x 6 min       Ankle Exercises: Standing   Other Standing Ankle Exercises  Sit to stand on airex under LE and on mat table x10, x5     Other Standing Ankle Exercises  Alt taps 4 in box with SPC 2x10       Ankle Exercises: Seated   Other Seated Ankle Exercises  LLE PF blue band 2x15, IN,EV,DR 1x10 AROM   Little IN/EV ROM   Other Seated Ankle Exercises  Blue Tband HS curls 2x15               PT Short Term Goals - 11/21/19 1423      PT SHORT TERM GOAL #1   Title  independent with initial HEP    Status  Achieved        PT Long Term Goals - 11/28/19 1428      PT LONG TERM GOAL #1   Title  increase AROM of the left ankle DF to 5 degrees    Status  On-going      PT LONG TERM GOAL #2   Title  decrease pain with standing 50%    Status  Partially Met      PT LONG TERM GOAL #3   Title  walk with cane and shoe x 100 feet    Status  Partially Met      PT LONG TERM GOAL #4   Title  be able to wear a shoe 10 hours a day    Status  On-going            Plan - 12/19/19 1559    Clinical Impression Statement  MD was pleased with Pt's Progress. L ankle is very weak with inversion, eversion, and dorsiflexion unable to tolerated any resistance. Backwards  walking was performed with HHA x 2. Pt would ambulate forward decrease stance time on LLE before going backwards with assist. Fatigues with gait using SPC. elliptical, and with sit to stands.    Personal Factors and Comorbidities  Comorbidity 3+    Examination-Activity Limitations  Squat;Stairs;Lift;Locomotion Level;Stand;Caring for Others;Carry;Toileting;Transfers    Examination-Participation Restrictions  Shop;Volunteer;Cleaning;Community Activity;Yard Work    Stability/Clinical Decision Making  Evolving/Moderate complexity    Rehab Potential  Fair    PT Frequency  2x / week    PT Treatment/Interventions  ADLs/Self Care Home Management;Cryotherapy;Electrical Stimulation;Functional mobility training;Stair training;Gait training;Therapeutic activities;Therapeutic exercise;Balance training;Neuromuscular re-education;Manual techniques;Patient/family education;Passive range of motion;Vasopneumatic Device    PT Next Visit Plan  without the boot during the PT treatment, work on ROM and coordination, toe activities       Patient will benefit from skilled therapeutic intervention in order to improve the following deficits and impairments:  Abnormal gait, Pain, Postural dysfunction, Increased muscle spasms, Decreased mobility, Decreased coordination, Cardiopulmonary status limiting activity, Decreased activity tolerance, Decreased endurance, Decreased range of motion, Decreased strength, Impaired flexibility, Difficulty walking, Decreased balance  Visit Diagnosis: Stiffness of left foot, not elsewhere classified  Difficulty in walking, not elsewhere classified  Pain in left ankle and joints of left foot  Localized edema     Problem List Patient Active Problem List   Diagnosis Date Noted  . Depression 09/26/2019  . Vitamin D deficiency 09/01/2019  . Class 3 severe obesity with serious comorbidity and body mass index (BMI) of 60.0 to 69.9 in adult ([place]) 09/01/2019  . Prediabetes 08/15/2019  .  Atrial fibrillation ([place])   . Chest pain 04/22/2016  . Hypokalemia 04/22/2016  . Morbid obesity ([place]) 04/22/2016  . Hypertension 04/22/2016  . Paroxysmal A-fib ([place]) 04/22/2016  . Hypomagnesemia 04/22/2016    Scot Jun 12/19/2019, 4:02 PM  [address] [place], [place], [address] Phone: [phone]   Fax:  [phone]  Name: ANNE BOLTZ MRN: [id number] Date of Birth: 01-23-1956

## 2019-12-20 ENCOUNTER — Encounter (INDEPENDENT_AMBULATORY_CARE_PROVIDER_SITE_OTHER): Payer: Self-pay

## 2019-12-20 ENCOUNTER — Ambulatory Visit (INDEPENDENT_AMBULATORY_CARE_PROVIDER_SITE_OTHER): Payer: Medicare Other | Admitting: Family Medicine

## 2019-12-21 ENCOUNTER — Encounter: Payer: Medicare Other | Admitting: Physical Therapy

## 2019-12-22 ENCOUNTER — Other Ambulatory Visit: Payer: Self-pay

## 2019-12-22 ENCOUNTER — Ambulatory Visit (INDEPENDENT_AMBULATORY_CARE_PROVIDER_SITE_OTHER): Payer: Medicare Other | Admitting: Family Medicine

## 2019-12-22 ENCOUNTER — Encounter (INDEPENDENT_AMBULATORY_CARE_PROVIDER_SITE_OTHER): Payer: Self-pay | Admitting: Family Medicine

## 2019-12-22 VITALS — BP 137/82 | HR 66 | Temp 98.5°F | Ht 63.0 in | Wt 340.0 lb

## 2019-12-22 DIAGNOSIS — Z6841 Body Mass Index (BMI) 40.0 and over, adult: Secondary | ICD-10-CM | POA: Diagnosis not present

## 2019-12-22 DIAGNOSIS — E559 Vitamin D deficiency, unspecified: Secondary | ICD-10-CM | POA: Diagnosis not present

## 2019-12-22 DIAGNOSIS — F3289 Other specified depressive episodes: Secondary | ICD-10-CM

## 2019-12-22 MED ORDER — VITAMIN D (ERGOCALCIFEROL) 1.25 MG (50000 UNIT) PO CAPS: 50000.0000 [IU] | ORAL_CAPSULE | ORAL | 0 refills | Status: DC

## 2019-12-22 MED ORDER — BUPROPION HCL ER (SR) 150 MG PO TB12: 150.0000 mg | ORAL_TABLET | Freq: Every day | ORAL | 0 refills | Status: DC

## 2019-12-22 NOTE — Progress Notes (Signed)
Chief Complaint:   OBESITY [name] is here to discuss her progress with her obesity treatment plan along with follow-up of her obesity related diagnoses. [name] is keeping a food journal of 1500 calories and 90 grams of protein daily and states she is following her eating plan approximately 85% of the time. [name] states she is doing physical therapy 45 minutes 1 to 2 times per week.  Today's visit was #: 63 Starting weight: 296 lbs Starting date: 02/08/2018 Today's weight: 340 lbs Today's date: 12/22/2019 Total lbs lost to date: 0 Total lbs lost since last in-office visit: 0  Interim History: [name] reports a lack of appetite due to Bupropion. She is not always getting the protein in. She is journaling most days.  Subjective:   Vitamin D deficiency [name]'s last vitamin D level was 48.1 on 11/17/19 and was not at goal. She is currently taking vit D, and she is nearly at goal.   Other depression, with emotional eating [name] reports her appetite and cravings are controlled with Bupropion. She has been working on behavior modification techniques to help reduce her emotional eating and has been somewhat successful. She shows no sign of suicidal or homicidal ideations.  Assessment/Plan:   Vitamin D deficiency  Low Vitamin D level contributes to fatigue and are associated with obesity, breast, and colon cancer. [name] agrees to continue to take prescription Vitamin D @50 ,000 IU every week #4 with no refills and she will follow-up for routine testing of Vitamin D, at least 2-3 times per year to avoid over-replacement.  Other depression, with emotional eating  Behavior modification techniques were discussed today to help [name] deal with her emotional/non-hunger eating behaviors. [name] agrees to continue buPROPion [facility] SR) 150 MG 12 hr tablet once daily #30 with no refills. Orders and follow up as documented in patient record.   Class 3 severe obesity with serious  comorbidity and body mass index (BMI) of 60.0 to 69.9 in adult, unspecified obesity type [facility]) [name] is currently in the action stage of change. As such, her goal is to continue with weight loss efforts. She has agreed to keeping a food journal and adhering to recommended goals of 1300 to 1600 calories and 80 to 90 grams of protein daily.   Exercise goals: [name] will continue her current exercise regimen and she will add resistance 2 times per week.  Behavioral modification strategies: increasing lean protein intake and keeping a strict food journal.  [name] has agreed to follow-up with our clinic in 2 weeks. She was informed of the importance of frequent follow-up visits to maximize her success with intensive lifestyle modifications for her multiple health conditions.   Objective:   Blood pressure 137/82, pulse 66, temperature 98.5 F (36.9 C), temperature source Oral, height 5\' 3"  (1.6 m), weight (!) 340 lb (154.2 kg), SpO2 97 %. Body mass index is 60.23 kg/m.  General: Cooperative, alert, well developed, in no acute distress. HEENT: Conjunctivae and lids unremarkable. Cardiovascular: Regular rhythm.  Lungs: Normal work of breathing. Neurologic: No focal deficits. Uses cane for ambulation.  Lab Results  Component Value Date   CREATININE 0.83 11/17/2019   BUN 25 11/17/2019   NA 144 11/17/2019   K 4.6 11/17/2019   CL 104 11/17/2019   CO2 21 11/17/2019   Lab Results  Component Value Date   ALT 11 11/17/2019   AST 18 11/17/2019   ALKPHOS 56 11/17/2019   BILITOT 0.3 11/17/2019   Lab Results  Component  Value Date   HGBA1C 5.6 11/17/2019   HGBA1C 5.8 (H) 08/11/2019   HGBA1C 5.5 06/30/2018   HGBA1C 5.7 (H) 02/08/2018   Lab Results  Component Value Date   INSULIN 9.2 11/17/2019   INSULIN 14.4 08/11/2019   INSULIN 4.8 06/30/2018   INSULIN 5.8 02/08/2018   Lab Results  Component Value Date   TSH 3.710 08/11/2019   Lab Results  Component Value Date   CHOL 166  02/08/2018   HDL 75 02/08/2018   LDLCALC 76 02/08/2018   TRIG 75 02/08/2018   CHOLHDL 2.2 02/08/2018   Lab Results  Component Value Date   WBC 11.2 (H) 08/24/2018   HGB 12.9 08/24/2018   HCT 40.3 08/24/2018   MCV 84.7 08/24/2018   PLT 471 (H) 08/24/2018   No results found for: IRON, TIBC, FERRITIN   Ref. Range 11/17/2019 15:14  Vitamin D, 25-Hydroxy Latest Ref Range: 30.0 - 100.0 ng/mL 48.1   Obesity Behavioral Intervention Documentation for Insurance:   Approximately 15 minutes were spent on the discussion below.  ASK: We discussed the diagnosis of obesity with [name] today and [name] agreed to give [place] permission to discuss obesity behavioral modification therapy today.  ASSESS: [name] has the diagnosis of obesity and her BMI today is 60.24. [name] is in the action stage of change.   ADVISE: [name] was educated on the multiple health risks of obesity as well as the benefit of weight loss to improve her health. She was advised of the need for long term treatment and the importance of lifestyle modifications to improve her current health and to decrease her risk of future health problems.  AGREE: Multiple dietary modification options and treatment options were discussed and [name] agreed to follow the recommendations documented in the above note.  ARRANGE: [name] was educated on the importance of frequent visits to treat obesity as outlined per CMS and USPSTF guidelines and agreed to schedule her next follow up appointment today.  Attestation Statements:   Reviewed by clinician on day of visit: allergies, medications, problem list, medical history, surgical history, family history, social history, and previous encounter notes.  Cristi Loron, am acting as Energy manager for [name], FNP-C.  I have reviewed the above documentation for accuracy and completeness, and I agree with the above. -  Aliannah Holstrom [employer], FNP-C

## 2019-12-24 ENCOUNTER — Other Ambulatory Visit (HOSPITAL_COMMUNITY): Payer: Self-pay | Admitting: Nurse Practitioner

## 2019-12-27 ENCOUNTER — Ambulatory Visit: Payer: Medicare Other | Attending: Orthopedic Surgery | Admitting: Physical Therapy

## 2019-12-27 ENCOUNTER — Other Ambulatory Visit (INDEPENDENT_AMBULATORY_CARE_PROVIDER_SITE_OTHER): Payer: Self-pay | Admitting: Family Medicine

## 2019-12-27 DIAGNOSIS — R600 Localized edema: Secondary | ICD-10-CM | POA: Insufficient documentation

## 2019-12-27 DIAGNOSIS — R262 Difficulty in walking, not elsewhere classified: Secondary | ICD-10-CM | POA: Insufficient documentation

## 2019-12-27 DIAGNOSIS — M25675 Stiffness of left foot, not elsewhere classified: Secondary | ICD-10-CM | POA: Insufficient documentation

## 2019-12-27 DIAGNOSIS — M25572 Pain in left ankle and joints of left foot: Secondary | ICD-10-CM | POA: Insufficient documentation

## 2019-12-27 DIAGNOSIS — F3289 Other specified depressive episodes: Secondary | ICD-10-CM

## 2019-12-29 ENCOUNTER — Encounter: Payer: Self-pay | Admitting: Physical Therapy

## 2019-12-29 ENCOUNTER — Other Ambulatory Visit: Payer: Self-pay

## 2019-12-29 ENCOUNTER — Ambulatory Visit: Payer: Medicare Other | Admitting: Physical Therapy

## 2019-12-29 DIAGNOSIS — R262 Difficulty in walking, not elsewhere classified: Secondary | ICD-10-CM

## 2019-12-29 DIAGNOSIS — M25572 Pain in left ankle and joints of left foot: Secondary | ICD-10-CM | POA: Diagnosis present

## 2019-12-29 DIAGNOSIS — R600 Localized edema: Secondary | ICD-10-CM

## 2019-12-29 DIAGNOSIS — M25675 Stiffness of left foot, not elsewhere classified: Secondary | ICD-10-CM

## 2019-12-29 NOTE — Therapy (Signed)
[place] [place], [place], [address] Phone: [phone]   Fax:  [phone]  Physical Therapy Treatment  Patient Details  Name: GENE COLEE MRN: [id number] Date of Birth: [birth year] Referring Provider (PT): Fayette Pho, Holl[place] Cellar MD   Encounter Date: 12/29/2019  PT End of Session - 12/29/19 1506    Visit Number  10    Date for PT Re-Evaluation  01/07/20    PT Start Time  9326    PT Stop Time  1506    PT Time Calculation (min)  41 min    Activity Tolerance  Patient tolerated treatment well    Behavior During Therapy  [facility] for tasks assessed/performed;Anxious       Past Medical History:  Diagnosis Date  . Atrial fibrillation ([place])   . Chronic lower back pain   . Depression   . DVT (deep venous thrombosis) (HCC) 1990s   LLE  . Fallen arches   . Hypertension   . Hyperthyroidism ~ 2000   "fine now" (04/23/2016)  . Joint pain   . Lactose intolerance   . Leg edema   . Obesity   . Pneumonia 1980s X 1  . Snoring    has not had sleep study but suspects sleep apnea    Past Surgical History:  Procedure Laterality Date  . CARDIOVERSION N/A 06/18/2016   Procedure: CARDIOVERSION;  Surgeon: Larey Dresser, MD;  Location: [place];  Service: Cardiovascular;  Laterality: N/A;  . CARDIOVERSION N/A 07/11/2016   Procedure: CARDIOVERSION;  Surgeon: Lelon Perla, MD;  Location: [facility] ENDOSCOPY;  Service: Cardiovascular;  Laterality: N/A;  . CARDIOVERSION N/A 07/02/2017   Procedure: CARDIOVERSION;  Surgeon: Sanda Klein, MD;  Location: [place] ENDOSCOPY;  Service: Cardiovascular;  Laterality: N/A;  . CARDIOVERSION N/A 09/29/2017   Procedure: CARDIOVERSION;  Surgeon: Larey Dresser, MD;  Location: [facility] ENDOSCOPY;  Service: Cardiovascular;  Laterality: N/A;  . ENDOMETRIAL ABLATION  ~2006  . TONSILLECTOMY  1960s  . TUBAL LIGATION  1979    There were no vitals filed for this visit.  Subjective Assessment  - 12/29/19 1425    Subjective  Been having pain on the top of her foot. Out of the boot completely    Currently in Pain?  No/denies                       [facility] Adult PT Treatment/Exercise - 12/29/19 0001      Ambulation/Gait   Ambulation/Gait  Yes    Ambulation Distance (Feet)  120 Feet    Assistive device  Straight cane    Gait Pattern  Step-to pattern;Decreased step length - right    Ambulation Surface  Level;Indoor    Gait Comments  x3, then gait for the begining of hall to the car ~ 140 ft       High Level Balance   High Level Balance Activities  Side stepping;Backward walking    High Level Balance Comments  HHAx2       Ankle Exercises: Aerobic   Nustep  L3 LE only       Ankle Exercises: Standing   Other Standing Ankle Exercises  Sit to stand on airex under LE and on mat table x10, x5     Other Standing Ankle Exercises  Alt taps 4 in box with SPC 2x10                PT Short Term  Goals - 11/21/19 1423      PT SHORT TERM GOAL #1   Title  independent with initial HEP    Status  Achieved        PT Long Term Goals - 11/28/19 1428      PT LONG TERM GOAL #1   Title  increase AROM of the left ankle DF to 5 degrees    Status  On-going      PT LONG TERM GOAL #2   Title  decrease pain with standing 50%    Status  Partially Met      PT LONG TERM GOAL #3   Title  walk with cane and shoe x 100 feet    Status  Partially Met      PT LONG TERM GOAL #4   Title  be able to wear a shoe 10 hours a day    Status  On-going            Plan - 12/29/19 1507    Clinical Impression Statement  L ankle weakness remains. During backwards walking she ambulated forward without AD then back with HHA. Some instability with sit to stand on airex. She was able to increase her single trial gait distance with [facility] but with a lot of fatigue.    Personal Factors and Comorbidities  Comorbidity 3+    Comorbidities  afib, obesity, depression    Examination-Activity  Limitations  Squat;Stairs;Lift;Locomotion Level;Stand;Caring for Others;Carry;Toileting;Transfers    Examination-Participation Restrictions  Shop;Volunteer;Cleaning;Community Activity;Yard Work    Stability/Clinical Decision Making  Evolving/Moderate complexity    Rehab Potential  Fair    PT Frequency  2x / week    PT Treatment/Interventions  ADLs/Self Care Home Management;Cryotherapy;Electrical Stimulation;Functional mobility training;Stair training;Gait training;Therapeutic activities;Therapeutic exercise;Balance training;Neuromuscular re-education;Manual techniques;Patient/family education;Passive range of motion;Vasopneumatic Device    PT Next Visit Plan  without the boot during the PT treatment, work on ROM and coordination, toe activities       Patient will benefit from skilled therapeutic intervention in order to improve the following deficits and impairments:  Abnormal gait, Pain, Postural dysfunction, Increased muscle spasms, Decreased mobility, Decreased coordination, Cardiopulmonary status limiting activity, Decreased activity tolerance, Decreased endurance, Decreased range of motion, Decreased strength, Impaired flexibility, Difficulty walking, Decreased balance  Visit Diagnosis: Pain in left ankle and joints of left foot  Localized edema  Difficulty in walking, not elsewhere classified  Stiffness of left foot, not elsewhere classified     Problem List Patient Active Problem List   Diagnosis Date Noted  . Depression 09/26/2019  . Vitamin D deficiency 09/01/2019  . Class 3 severe obesity with serious comorbidity and body mass index (BMI) of 60.0 to 69.9 in adult ([place]) 09/01/2019  . Prediabetes 08/15/2019  . Atrial fibrillation ([place])   . Chest pain 04/22/2016  . Hypokalemia 04/22/2016  . Morbid obesity ([place]) 04/22/2016  . Hypertension 04/22/2016  . Paroxysmal A-fib ([place]) 04/22/2016  . Hypomagnesemia 04/22/2016    Scot Jun, PTA 12/29/2019, 3:12 PM  [address] [place], [place], [address] Phone: [phone]   Fax:  [phone]  Name: WALLY BEHAN MRN: [id number] Date of Birth: [birth year]

## 2019-12-30 ENCOUNTER — Ambulatory Visit: Payer: Medicare Other | Admitting: Physical Therapy

## 2019-12-30 ENCOUNTER — Encounter: Payer: Self-pay | Admitting: Physical Therapy

## 2019-12-30 DIAGNOSIS — R600 Localized edema: Secondary | ICD-10-CM | POA: Diagnosis not present

## 2019-12-30 DIAGNOSIS — M25675 Stiffness of left foot, not elsewhere classified: Secondary | ICD-10-CM

## 2019-12-30 DIAGNOSIS — R262 Difficulty in walking, not elsewhere classified: Secondary | ICD-10-CM

## 2019-12-30 DIAGNOSIS — M25572 Pain in left ankle and joints of left foot: Secondary | ICD-10-CM

## 2019-12-30 NOTE — Therapy (Signed)
[place], [place], [address] Phone: [phone]   Fax:  [phone]  Physical Therapy Treatment  Patient Details  Name: [name] MRN: [id number] Date of Birth: 30-Nov-1955 Referring Provider (PT): Fayette Pho, [place] Cellar MD   Encounter Date: 12/30/2019  PT End of Session - 12/30/19 0925    Visit Number  11    Date for PT Re-Evaluation  01/07/20    PT Start Time  0837    PT Stop Time  0925    PT Time Calculation (min)  48 min    Activity Tolerance  Patient tolerated treatment well    Behavior During Therapy  [facility] for tasks assessed/performed;Anxious       Past Medical History:  Diagnosis Date  . Atrial fibrillation ([place])   . Chronic lower back pain   . Depression   . DVT (deep venous thrombosis) (HCC) 1990s   LLE  . Fallen arches   . Hypertension   . Hyperthyroidism ~ 2000   "fine now" (04/23/2016)  . Joint pain   . Lactose intolerance   . Leg edema   . Obesity   . Pneumonia 1980s X 1  . Snoring    has not had sleep study but suspects sleep apnea    Past Surgical History:  Procedure Laterality Date  . CARDIOVERSION N/A 06/18/2016   Procedure: CARDIOVERSION;  Surgeon: Larey Dresser, MD;  Location: [place];  Service: Cardiovascular;  Laterality: N/A;  . CARDIOVERSION N/A 07/11/2016   Procedure: CARDIOVERSION;  Surgeon: Lelon Perla, MD;  Location: [facility] ENDOSCOPY;  Service: Cardiovascular;  Laterality: N/A;  . CARDIOVERSION N/A 07/02/2017   Procedure: CARDIOVERSION;  Surgeon: Sanda Klein, MD;  Location: [facility] ENDOSCOPY;  Service: Cardiovascular;  Laterality: N/A;  . CARDIOVERSION N/A 09/29/2017   Procedure: CARDIOVERSION;  Surgeon: Larey Dresser, MD;  Location: [facility] ENDOSCOPY;  Service: Cardiovascular;  Laterality: N/A;  . ENDOMETRIAL ABLATION  ~2006  . TONSILLECTOMY  1960s  . TUBAL LIGATION  1979    There were no vitals filed for this visit.  Subjective Assessment  - 12/30/19 0839    Subjective  "I am ok"    Currently in Pain?  No/denies         [facility] PT Assessment - 12/30/19 0001      AROM   Left Ankle Dorsiflexion  5    Left Ankle Plantar Flexion  35    Left Ankle Inversion  9    Left Ankle Eversion  5                   [facility] Adult PT Treatment/Exercise - 12/30/19 0001      Ambulation/Gait   Ambulation/Gait  Yes    Ambulation Distance (Feet)  180 Feet    Assistive device  Straight cane    Gait Pattern  Step-to pattern;Decreased step length - right    Ambulation Surface  Level;Unlevel;Indoor;Outdoor;Paved    Gait Comments  gait from treatment room to car, two standing rest brakes.       Manual Therapy   Manual Therapy  Passive ROM    Manual therapy comments  planter foot pain with DF    Passive ROM  L ankle all directions      Ankle Exercises: Aerobic   Elliptical  I1 R5 x 1 min    Nustep  L2 LE only x 6 min       Ankle Exercises: Seated  Other Seated Ankle Exercises  LLE PF blue band 2x15, IN,EV,DR 1x10 AROM    Other Seated Ankle Exercises  Sit to stant 2x10      Ankle Exercises: Standing   Heel Raises  Both;2 seconds;20 reps    Other Standing Ankle Exercises  6inch step ups 2x10     Other Standing Ankle Exercises  Alt taps 4 in box with SPC 2x10                PT Short Term Goals - 11/21/19 1423      PT SHORT TERM GOAL #1   Title  independent with initial HEP    Status  Achieved        PT Long Term Goals - 11/28/19 1428      PT LONG TERM GOAL #1   Title  increase AROM of the left ankle DF to 5 degrees    Status  On-going      PT LONG TERM GOAL #2   Title  decrease pain with standing 50%    Status  Partially Met      PT LONG TERM GOAL #3   Title  walk with cane and shoe x 100 feet    Status  Partially Met      PT LONG TERM GOAL #4   Title  be able to wear a shoe 10 hours a day    Status  On-going            Plan - 12/30/19 0926    Clinical Impression Statement  Some LE  soreness today's from therapy session yesterday. Backed some interventions down. Muscle burning with sit to stands in the quads. Some improvement with L ankle ROM. Inversion, eversion, and dorsi flexion remains very weak.Very fatigue after today's session.    Comorbidities  afib, obesity, depression    Examination-Activity Limitations  Squat;Stairs;Lift;Locomotion Level;Stand;Caring for Others;Carry;Toileting;Transfers    Examination-Participation Restrictions  Shop;Volunteer;Cleaning;Community Activity;Yard Work    Merchant [employer] officer  Evolving/Moderate complexity    Rehab Potential  Fair    PT Frequency  2x / week    PT Duration  8 weeks    PT Treatment/Interventions  ADLs/Self Care Home Management;Cryotherapy;Electrical Stimulation;Functional mobility training;Stair training;Gait training;Therapeutic activities;Therapeutic exercise;Balance training;Neuromuscular re-education;Manual techniques;Patient/family education;Passive range of motion;Vasopneumatic Device    PT Next Visit Plan  without the boot during the PT treatment, work on ROM and coordination, toe activities       Patient will benefit from skilled therapeutic intervention in order to improve the following deficits and impairments:  Abnormal gait, Pain, Postural dysfunction, Increased muscle spasms, Decreased mobility, Decreased coordination, Cardiopulmonary status limiting activity, Decreased activity tolerance, Decreased endurance, Decreased range of motion, Decreased strength, Impaired flexibility, Difficulty walking, Decreased balance  Visit Diagnosis: Localized edema  Pain in left ankle and joints of left foot  Difficulty in walking, not elsewhere classified  Stiffness of left foot, not elsewhere classified     Problem List Patient Active Problem List   Diagnosis Date Noted  . Depression 09/26/2019  . Vitamin D deficiency 09/01/2019  . Class 3 severe obesity with serious comorbidity and body mass index  (BMI) of 60.0 to 69.9 in adult ([place]) 09/01/2019  . Prediabetes 08/15/2019  . Atrial fibrillation ([place])   . Chest pain 04/22/2016  . Hypokalemia 04/22/2016  . Morbid obesity ([place]) 04/22/2016  . Hypertension 04/22/2016  . Paroxysmal A-fib ([place]) 04/22/2016  . Hypomagnesemia 04/22/2016    Scot Jun, PTA 12/30/2019, 9:29 AM  [place]  [address], [place], [address] Phone: [phone]   Fax:  [phone]  Name: SHEQUITA PEPLINSKI MRN: [id number] Date of Birth: [birth year]

## 2020-01-04 ENCOUNTER — Other Ambulatory Visit: Payer: Self-pay

## 2020-01-04 ENCOUNTER — Ambulatory Visit: Payer: Medicare Other | Admitting: Physical Therapy

## 2020-01-04 ENCOUNTER — Encounter: Payer: Self-pay | Admitting: Physical Therapy

## 2020-01-04 DIAGNOSIS — M25572 Pain in left ankle and joints of left foot: Secondary | ICD-10-CM

## 2020-01-04 DIAGNOSIS — R262 Difficulty in walking, not elsewhere classified: Secondary | ICD-10-CM

## 2020-01-04 DIAGNOSIS — R600 Localized edema: Secondary | ICD-10-CM | POA: Diagnosis not present

## 2020-01-04 DIAGNOSIS — M25675 Stiffness of left foot, not elsewhere classified: Secondary | ICD-10-CM

## 2020-01-04 NOTE — Therapy (Signed)
[facility] [place] [place], [place], [address] Phone: [phone]   Fax:  [phone]  Physical Therapy Treatment  Patient Details  Name: [name] MRN: [id number] Date of Birth: 01-24-1956 Referring Provider (PT): Fayette Pho,  Cellar MD   Encounter Date: 01/04/2020  PT End of Session - 01/04/20 1336    Visit Number  12    Date for PT Re-Evaluation  01/07/20    PT Start Time  1300    PT Stop Time  1342    PT Time Calculation (min)  42 min    Behavior During Therapy  [facility] for tasks assessed/performed       Past Medical History:  Diagnosis Date  . Atrial fibrillation ([place])   . Chronic lower back pain   . Depression   . DVT (deep venous thrombosis) (HCC) 1990s   LLE  . Fallen arches   . Hypertension   . Hyperthyroidism ~ 2000   "fine now" (04/23/2016)  . Joint pain   . Lactose intolerance   . Leg edema   . Obesity   . Pneumonia 1980s X 1  . Snoring    has not had sleep study but suspects sleep apnea    Past Surgical History:  Procedure Laterality Date  . CARDIOVERSION N/A 06/18/2016   Procedure: CARDIOVERSION;  Surgeon: Larey Dresser, MD;  Location: [place];  Service: Cardiovascular;  Laterality: N/A;  . CARDIOVERSION N/A 07/11/2016   Procedure: CARDIOVERSION;  Surgeon: Lelon Perla, MD;  Location: [facility] ENDOSCOPY;  Service: Cardiovascular;  Laterality: N/A;  . CARDIOVERSION N/A 07/02/2017   Procedure: CARDIOVERSION;  Surgeon: Sanda Klein, MD;  Location: [place] ENDOSCOPY;  Service: Cardiovascular;  Laterality: N/A;  . CARDIOVERSION N/A 09/29/2017   Procedure: CARDIOVERSION;  Surgeon: Larey Dresser, MD;  Location: [facility] ENDOSCOPY;  Service: Cardiovascular;  Laterality: N/A;  . ENDOMETRIAL ABLATION  ~2006  . TONSILLECTOMY  1960s  . TUBAL LIGATION  1979    There were no vitals filed for this visit.  Subjective Assessment - 01/04/20 1302    Subjective  Stomach feels a little off  today. Reports using the cane at home and in a restaurant Saturday. Does not feel to comfortable with it    Currently in Pain?  No/denies                       [facility] Adult PT Treatment/Exercise - 01/04/20 0001      Ambulation/Gait   Ambulation/Gait  Yes    Ambulation Distance (Feet)  180 Feet    Assistive device  Straight cane    Gait Pattern  Step-to pattern;Decreased step length - right    Ambulation Surface  Level;Indoor      High Level Balance   High Level Balance Activities  Side stepping;Backward walking    High Level Balance Comments  HHA x2 with side step       Ankle Exercises: Standing   Other Standing Ankle Exercises  sit to stand holding blue ball     Other Standing Ankle Exercises  Alt taps 6 in box with SPC 2x10       Ankle Exercises: Seated   Other Seated Ankle Exercises  LLE PF blue band 2x15, IN,EV,DR 1x10 AROM    Other Seated Ankle Exercises  Blue Tband HS curls 2x15; LLE 5lb 2x15       Ankle Exercises: Aerobic   Nustep  L3 LE only x 6  min                PT Short Term Goals - 11/21/19 1423      PT SHORT TERM GOAL #1   Title  independent with initial HEP    Status  Achieved        PT Long Term Goals - 11/28/19 1428      PT LONG TERM GOAL #1   Title  increase AROM of the left ankle DF to 5 degrees    Status  On-going      PT LONG TERM GOAL #2   Title  decrease pain with standing 50%    Status  Partially Met      PT LONG TERM GOAL #3   Title  walk with cane and shoe x 100 feet    Status  Partially Met      PT LONG TERM GOAL #4   Title  be able to wear a shoe 10 hours a day    Status  On-going            Plan - 01/04/20 1337    Clinical Impression Statement  Gait distance was total of two trials. She was able to backwards walk without HHA. Her ankle remains very weak. No reports of pain, L ankle is significantly pronated when weight bearing/. Able to progress to the 6 inch box with alternating box taps but was  taxing    Personal Factors and Comorbidities  Comorbidity 3+    Comorbidities  afib, obesity, depression    Examination-Activity Limitations  Squat;Stairs;Lift;Locomotion Level;Stand;Caring for Others;Carry;Toileting;Transfers    Examination-Participation Restrictions  Shop;Volunteer;Cleaning;Community Activity;Yard Work    [employer]chant [employer] officer  Evolving/Moderate complexity    Rehab Potential  Fair    PT Frequency  2x / week    PT Duration  8 weeks    PT Treatment/Interventions  ADLs/Self Care Home Management;Cryotherapy;Electrical Stimulation;Functional mobility training;Stair training;Gait training;Therapeutic activities;Therapeutic exercise;Balance training;Neuromuscular re-education;Manual techniques;Patient/family education;Passive range of motion;Vasopneumatic Device    PT Next Visit Plan  without the boot during the PT treatment, work on ROM and coordination, toe activities       Patient will benefit from skilled therapeutic intervention in order to improve the following deficits and impairments:  Abnormal gait, Pain, Postural dysfunction, Increased muscle spasms, Decreased mobility, Decreased coordination, Cardiopulmonary status limiting activity, Decreased activity tolerance, Decreased endurance, Decreased range of motion, Decreased strength, Impaired flexibility, Difficulty walking, Decreased balance  Visit Diagnosis: Localized edema  Pain in left ankle and joints of left foot  Difficulty in walking, not elsewhere classified  Stiffness of left foot, not elsewhere classified     Problem List Patient Active Problem List   Diagnosis Date Noted  . Depression 09/26/2019  . Vitamin D deficiency 09/01/2019  . Class 3 severe obesity with serious comorbidity and body mass index (BMI) of 60.0 to 69.9 in adult ([place]) 09/01/2019  . Prediabetes 08/15/2019  . Atrial fibrillation ([place])   . Chest pain 04/22/2016  . Hypokalemia 04/22/2016  . Morbid obesity ([place]) 04/22/2016   . Hypertension 04/22/2016  . Paroxysmal A-fib ([place]) 04/22/2016  . Hypomagnesemia 04/22/2016    Scot Jun, PTA 01/04/2020, 1:42 PM  [address] [place], [place], [address] Phone: [phone]   Fax:  [phone]  Name: [name] MRN: [id number] Date of Birth: August 26, 1956

## 2020-01-05 ENCOUNTER — Ambulatory Visit (INDEPENDENT_AMBULATORY_CARE_PROVIDER_SITE_OTHER): Payer: Medicare Other | Admitting: Family Medicine

## 2020-01-05 ENCOUNTER — Encounter (INDEPENDENT_AMBULATORY_CARE_PROVIDER_SITE_OTHER): Payer: Self-pay | Admitting: Family Medicine

## 2020-01-05 VITALS — BP 144/85 | HR 65 | Temp 98.1°F | Ht 63.0 in | Wt 337.0 lb

## 2020-01-05 DIAGNOSIS — R7303 Prediabetes: Secondary | ICD-10-CM | POA: Diagnosis not present

## 2020-01-05 DIAGNOSIS — Z6841 Body Mass Index (BMI) 40.0 and over, adult: Secondary | ICD-10-CM | POA: Diagnosis not present

## 2020-01-05 NOTE — Progress Notes (Signed)
Chief Complaint:   OBESITY [name] is here to discuss her progress with her obesity treatment plan along with follow-up of her obesity related diagnoses. [name] is keeping a food journal and adhering to recommended goals of 1500 calories and 90 grams of protein and states she is following her eating plan approximately 85-90% of the time. [name] states she is doing physical therapy 45 minutes 2 times per week.  Today's visit was #: 18 Starting weight: 296 lbs Starting date: 02/08/2018 Today's weight: 337 lbs Today's date: 01/05/2020 Total lbs lost to date: 0 Total lbs lost since last in-office visit: 3  Interim History: [name] is averaging about 1500 calories per day. She meets her calorie goals most days. She journals most weekdays, but less on weekends.  Subjective:   Prediabetes. [name] has a diagnosis of prediabetes based on her elevated HgA1c and was informed this puts her at greater risk of developing diabetes. She continues to work on diet and exercise to decrease her risk of diabetes. She denies nausea or hypoglycemia. [name] is not on metformin. She denies polyphagia.  Lab Results  Component Value Date   HGBA1C 5.6 11/17/2019   Lab Results  Component Value Date   INSULIN 9.2 11/17/2019   INSULIN 14.4 08/11/2019   INSULIN 4.8 06/30/2018   INSULIN 5.8 02/08/2018   Assessment/Plan:   Prediabetes. [name] will continue her meal plan, continue to work on weight loss, exercise, and decreasing simple carbohydrates to help decrease the risk of diabetes.   Class 3 severe obesity with serious comorbidity and body mass index (BMI) of 50.0 to 59.9 in adult, unspecified obesity type ([name]).  [name] is currently in the action stage of change. As such, her goal is to continue with weight loss efforts. She has agreed to keeping a food journal and adhering to recommended goals of 1300-1600 calories and 80-90 grams of protein daily.   She will increase her journaling to 6  days a week.  Handout was given on Recipes.  Exercise goals: [name] will continue her current exercise regimen.  Behavioral modification strategies: keeping a strict food journal.  [name] has agreed to follow-up with our clinic in 2 weeks. She was informed of the importance of frequent follow-up visits to maximize her success with intensive lifestyle modifications for her multiple health conditions.   Objective:   Blood pressure (!) 144/85, pulse 65, temperature 98.1 F (36.7 C), temperature source Oral, height 5\' 3"  (1.6 m), weight (!) 337 lb (152.9 kg), SpO2 97 %. Body mass index is 59.7 kg/m.  General: Cooperative, alert, well developed, in no acute distress. HEENT: Conjunctivae and lids unremarkable. Cardiovascular: Regular rhythm.  Lungs: Normal work of breathing. Neurologic: No focal deficits. Using a walker for ambulation.  Lab Results  Component Value Date   CREATININE 0.83 11/17/2019   BUN 25 11/17/2019   NA 144 11/17/2019   K 4.6 11/17/2019   CL 104 11/17/2019   CO2 21 11/17/2019   Lab Results  Component Value Date   ALT 11 11/17/2019   AST 18 11/17/2019   ALKPHOS 56 11/17/2019   BILITOT 0.3 11/17/2019   Lab Results  Component Value Date   HGBA1C 5.6 11/17/2019   HGBA1C 5.8 (H) 08/11/2019   HGBA1C 5.5 06/30/2018   HGBA1C 5.7 (H) 02/08/2018   Lab Results  Component Value Date   INSULIN 9.2 11/17/2019   INSULIN 14.4 08/11/2019   INSULIN 4.8 06/30/2018   INSULIN 5.8 02/08/2018   Lab Results  Component Value Date   TSH 3.710 08/11/2019   Lab Results  Component Value Date   CHOL 166 02/08/2018   HDL 75 02/08/2018   LDLCALC 76 02/08/2018   TRIG 75 02/08/2018   CHOLHDL 2.2 02/08/2018   Lab Results  Component Value Date   WBC 11.2 (H) 08/24/2018   HGB 12.9 08/24/2018   HCT 40.3 08/24/2018   MCV 84.7 08/24/2018   PLT 471 (H) 08/24/2018   No results found for: IRON, TIBC, FERRITIN  Obesity Behavioral Intervention Documentation for Insurance:    Approximately 15 minutes were spent on the discussion below.  ASK: We discussed the diagnosis of obesity with [name] today and [name] agreed to give [place] permission to discuss obesity behavioral modification therapy today.  ASSESS: [name] has the diagnosis of obesity and her BMI today is 59.7. [name] is in the action stage of change.   ADVISE: [name] was educated on the multiple health risks of obesity as well as the benefit of weight loss to improve her health. She was advised of the need for long term treatment and the importance of lifestyle modifications to improve her current health and to decrease her risk of future health problems.  AGREE: Multiple dietary modification options and treatment options were discussed and [name] agreed to follow the recommendations documented in the above note.  ARRANGE: [name] was educated on the importance of frequent visits to treat obesity as outlined per CMS and USPSTF guidelines and agreed to schedule her next follow up appointment today.  Attestation Statements:   Reviewed by clinician on day of visit: allergies, medications, problem list, medical history, surgical history, family history, social history, and previous encounter notes.  IMarianna Payment, am acting as Energy manager for [name], FNP   I have reviewed the above documentation for accuracy and completeness, and I agree with the above. -  Jesse Sans, FNP

## 2020-01-06 ENCOUNTER — Encounter: Payer: Self-pay | Admitting: Physical Therapy

## 2020-01-06 ENCOUNTER — Ambulatory Visit: Payer: Medicare Other | Admitting: Physical Therapy

## 2020-01-06 ENCOUNTER — Other Ambulatory Visit: Payer: Self-pay

## 2020-01-06 ENCOUNTER — Encounter (INDEPENDENT_AMBULATORY_CARE_PROVIDER_SITE_OTHER): Payer: Self-pay | Admitting: Family Medicine

## 2020-01-06 DIAGNOSIS — R262 Difficulty in walking, not elsewhere classified: Secondary | ICD-10-CM

## 2020-01-06 DIAGNOSIS — M25675 Stiffness of left foot, not elsewhere classified: Secondary | ICD-10-CM

## 2020-01-06 DIAGNOSIS — R600 Localized edema: Secondary | ICD-10-CM | POA: Diagnosis not present

## 2020-01-06 DIAGNOSIS — M25572 Pain in left ankle and joints of left foot: Secondary | ICD-10-CM

## 2020-01-06 NOTE — Therapy (Signed)
[facility] [address] [place], [place], [address] Phone: [phone]   Fax:  [phone]  Physical Therapy Treatment  Patient Details  Name: [name] MRN: [id number] Date of Birth: 05/25/1956 Referring Provider (PT): Tam Huynh, Samuel [place] MD   Encounter Date: 01/06/2020  PT End of Session - 01/06/20 1139    Visit Number  13    Date for PT Re-Evaluation  01/07/20    PT Start Time  1058    PT Stop Time  1140    PT Time Calculation (min)  42 min    Activity Tolerance  Patient tolerated treatment well    Behavior During Therapy  WFL for tasks assessed/performed       Past Medical History:  Diagnosis Date  . Atrial fibrillation (HCC)   . Chronic lower back pain   . Depression   . DVT (deep venous thrombosis) (HCC) 1990s   LLE  . Fallen arches   . Hypertension   . Hyperthyroidism ~ 2000   "fine now" (04/23/2016)  . Joint pain   . Lactose intolerance   . Leg edema   . Obesity   . Pneumonia 1980s X 1  . Snoring    has not had sleep study but suspects sleep apnea    Past Surgical History:  Procedure Laterality Date  . CARDIOVERSION N/A 06/18/2016   Procedure: CARDIOVERSION;  Surgeon: Dalton S McLean, MD;  Location: [facility] ENDOSCOPY;  Service: Cardiovascular;  Laterality: N/A;  . CARDIOVERSION N/A 07/11/2016   Procedure: CARDIOVERSION;  Surgeon: Brian S Crenshaw, MD;  Location: [facility] ENDOSCOPY;  Service: Cardiovascular;  Laterality: N/A;  . CARDIOVERSION N/A 07/02/2017   Procedure: CARDIOVERSION;  Surgeon: Croitoru, Mihai, MD;  Location: [facility] ENDOSCOPY;  Service: Cardiovascular;  Laterality: N/A;  . CARDIOVERSION N/A 09/29/2017   Procedure: CARDIOVERSION;  Surgeon: McLean, Dalton S, MD;  Location: [facility] ENDOSCOPY;  Service: Cardiovascular;  Laterality: N/A;  . ENDOMETRIAL ABLATION  ~2006  . TONSILLECTOMY  1960s  . TUBAL LIGATION  1979    There were no vitals filed for this visit.  Subjective Assessment -  01/06/20 1100    Subjective  "I am wore out" Pt ambulated in with SPC    Currently in Pain?  No/denies                       [facility] Adult PT Treatment/Exercise - 01/06/20 0001      Ambulation/Gait   Ambulation/Gait  Yes    Ambulation Distance (Feet)  22 Feet    Assistive device  None    Gait Pattern  Step-through pattern    Ambulation Surface  Level;Indoor    Gait Comments  x4, two trial in and out of cones       High Level Balance   High Level Balance Activities  Backward walking      Ankle Exercises: Aerobic   Nustep  L3 LE only x 6 min       Ankle Exercises: Standing   Other Standing Ankle Exercises  sit to stand holding blue ball     Other Standing Ankle Exercises  Alt taps 6 in box with SPC 2x10       Ankle Exercises: Seated   Other Seated Ankle Exercises  LLE PF blue band 2x15, IN,EV,DR 1x10 AROM    Other Seated Ankle Exercises  Black Tband HS curls 2x15                 PT Short Term Goals - 11/21/19 1423      PT SHORT TERM GOAL #1   Title  independent with initial HEP    Status  Achieved        PT Long Term Goals - 01/06/20 1143      PT LONG TERM GOAL #1   Title  increase AROM of the left ankle DF to 5 degrees    Status  On-going      PT LONG TERM GOAL #2   Title  decrease pain with standing 50%    Status  Achieved      PT LONG TERM GOAL #3   Title  walk with cane and shoe x 100 feet    Status  Partially Met      PT LONG TERM GOAL #4   Title  be able to wear a shoe 10 hours a day    Status  Achieved            Plan - 01/06/20 1144    Clinical Impression Statement  Pt is progressing well. She ambulated from the car into the clinic with SPC for the first time. She reports washing dishes and making her bed. She does not report any pain but the L ankle is very weak. She is very limited with eversion and inversion actively. She DF motion she does have is very weak. Backwards walking performed without HHA.    Personal Factors  and Comorbidities  Comorbidity 3+    Comorbidities  afib, obesity, depression    Examination-Activity Limitations  Squat;Stairs;Lift;Locomotion Level;Stand;Caring for Others;Carry;Toileting;Transfers    Examination-Participation Restrictions  Shop;Volunteer;Cleaning;Community Activity;Yard Work    Stability/Clinical Decision Making  Evolving/Moderate complexity    Rehab Potential  Fair    PT Frequency  2x / week    PT Duration  8 weeks    PT Treatment/Interventions  ADLs/Self Care Home Management;Cryotherapy;Electrical Stimulation;Functional mobility training;Stair training;Gait training;Therapeutic activities;Therapeutic exercise;Balance training;Neuromuscular re-education;Manual techniques;Patient/family education;Passive range of motion;Vasopneumatic Device    PT Next Visit Plan  functional strenght and endurance.       Patient will benefit from skilled therapeutic intervention in order to improve the following deficits and impairments:  Abnormal gait, Pain, Postural dysfunction, Increased muscle spasms, Decreased mobility, Decreased coordination, Cardiopulmonary status limiting activity, Decreased activity tolerance, Decreased endurance, Decreased range of motion, Decreased strength, Impaired flexibility, Difficulty walking, Decreased balance  Visit Diagnosis: Pain in left ankle and joints of left foot  Difficulty in walking, not elsewhere classified  Stiffness of left foot, not elsewhere classified  Localized edema     Problem List Patient Active Problem List   Diagnosis Date Noted  . Depression 09/26/2019  . Vitamin D deficiency 09/01/2019  . Class 3 severe obesity with serious comorbidity and body mass index (BMI) of 60.0 to 69.9 in adult (HCC) 09/01/2019  . Prediabetes 08/15/2019  . Atrial fibrillation (HCC)   . Chest pain 04/22/2016  . Hypokalemia 04/22/2016  . Morbid obesity (HCC) 04/22/2016  . Hypertension 04/22/2016  . Paroxysmal A-fib (HCC) 04/22/2016  .  Hypomagnesemia 04/22/2016    Ronald G Pemberton, PTA 01/06/2020, 11:47 AM  [facility] [address] [place], [place], [address] Phone: [phone]   Fax:  [phone]  Name: [name] MRN: [id number] Date of Birth: 01/12/1956   

## 2020-01-09 ENCOUNTER — Encounter: Payer: Self-pay | Admitting: Physical Therapy

## 2020-01-09 ENCOUNTER — Other Ambulatory Visit: Payer: Self-pay

## 2020-01-09 ENCOUNTER — Ambulatory Visit: Payer: Medicare Other | Admitting: Physical Therapy

## 2020-01-09 DIAGNOSIS — M25675 Stiffness of left foot, not elsewhere classified: Secondary | ICD-10-CM

## 2020-01-09 DIAGNOSIS — R262 Difficulty in walking, not elsewhere classified: Secondary | ICD-10-CM

## 2020-01-09 DIAGNOSIS — R600 Localized edema: Secondary | ICD-10-CM

## 2020-01-09 DIAGNOSIS — M25572 Pain in left ankle and joints of left foot: Secondary | ICD-10-CM

## 2020-01-09 NOTE — Addendum Note (Signed)
Addended by: Jearld Lesch on: 01/09/2020 02:58 PM   Modules accepted: Orders

## 2020-01-09 NOTE — Therapy (Signed)
[place] [place], [place], [address] Phone: [phone]   Fax:  [phone]  Physical Therapy Treatment  Patient Details  Name: [name] MRN: [id number] Date of Birth: 26-Dec-1955 Referring Provider (PT): Fayette Pho, [place] Cellar MD   Encounter Date: 01/09/2020  PT End of Session - 01/09/20 1427    Visit Number  14    Date for PT Re-Evaluation  01/07/20    PT Start Time  1345    PT Stop Time  1430    PT Time Calculation (min)  45 min    Activity Tolerance  Patient tolerated treatment well    Behavior During Therapy  [facility] for tasks assessed/performed       Past Medical History:  Diagnosis Date  . Atrial fibrillation ([place])   . Chronic lower back pain   . Depression   . DVT (deep venous thrombosis) (HCC) 1990s   LLE  . Fallen arches   . Hypertension   . Hyperthyroidism ~ 2000   "fine now" (04/23/2016)  . Joint pain   . Lactose intolerance   . Leg edema   . Obesity   . Pneumonia 1980s X 1  . Snoring    has not had sleep study but suspects sleep apnea    Past Surgical History:  Procedure Laterality Date  . CARDIOVERSION N/A 06/18/2016   Procedure: CARDIOVERSION;  Surgeon: Larey Dresser, MD;  Location: [place];  Service: Cardiovascular;  Laterality: N/A;  . CARDIOVERSION N/A 07/11/2016   Procedure: CARDIOVERSION;  Surgeon: Lelon Perla, MD;  Location: [facility] ENDOSCOPY;  Service: Cardiovascular;  Laterality: N/A;  . CARDIOVERSION N/A 07/02/2017   Procedure: CARDIOVERSION;  Surgeon: Sanda Klein, MD;  Location: [place] ENDOSCOPY;  Service: Cardiovascular;  Laterality: N/A;  . CARDIOVERSION N/A 09/29/2017   Procedure: CARDIOVERSION;  Surgeon: Larey Dresser, MD;  Location: [facility] ENDOSCOPY;  Service: Cardiovascular;  Laterality: N/A;  . ENDOMETRIAL ABLATION  ~2006  . TONSILLECTOMY  1960s  . TUBAL LIGATION  1979    There were no vitals filed for this visit.  Subjective Assessment -  01/09/20 1346    Subjective  "all right, used the walker a little this weekend"    Currently in Pain?  No/denies                       [facility] Adult PT Treatment/Exercise - 01/09/20 0001      High Level Balance   High Level Balance Activities  Backward walking;Figure 8 turns      Manual Therapy   Manual Therapy  Passive ROM    Manual therapy comments  planter foot pain with DF    Passive ROM  L ankle all directions      Ankle Exercises: Aerobic   Nustep  L4 LE only x 6 min       Ankle Exercises: Standing   Other Standing Ankle Exercises  sit to stand holding 9lb dumbbell 3x10    Other Standing Ankle Exercises  4 in alt taps without AD 2x10      Ankle Exercises: Seated   Other Seated Ankle Exercises  Black Tband HS curls 2x15               PT Short Term Goals - 11/21/19 1423      PT SHORT TERM GOAL #1   Title  independent with initial HEP    Status  Achieved  PT Long Term Goals - 01/06/20 1143      PT LONG TERM GOAL #1   Title  increase AROM of the left ankle DF to 5 degrees    Status  On-going      PT LONG TERM GOAL #2   Title  decrease pain with standing 50%    Status  Achieved      PT LONG TERM GOAL #3   Title  walk with cane and shoe x 100 feet    Status  Partially Met      PT LONG TERM GOAL #4   Title  be able to wear a shoe 10 hours a day    Status  Achieved            Plan - 01/09/20 1427    Clinical Impression Statement  Pt reports using her SPC more at home. She did ambulate in clinic with SPC again. Decrease stp length noted with backwards walking. She progressed with alt taps without AD but under CGA. L able is weak and have limited motion. Some instability and hesitation with resisted gait.    Comorbidities  afib, obesity, depression    Examination-Participation Restrictions  Shop;Volunteer;Cleaning;Community Activity;Yard Work    Merchant [employer] officer  Evolving/Moderate complexity    PT Frequency   2x / week    PT Duration  8 weeks    PT Treatment/Interventions  ADLs/Self Care Home Management;Cryotherapy;Electrical Stimulation;Functional mobility training;Stair training;Gait training;Therapeutic activities;Therapeutic exercise;Balance training;Neuromuscular re-education;Manual techniques;Patient/family education;Passive range of motion;Vasopneumatic Device    PT Next Visit Plan  functional strenght and endurance.       Patient will benefit from skilled therapeutic intervention in order to improve the following deficits and impairments:  Abnormal gait, Pain, Postural dysfunction, Increased muscle spasms, Decreased mobility, Decreased coordination, Cardiopulmonary status limiting activity, Decreased activity tolerance, Decreased endurance, Decreased range of motion, Decreased strength, Impaired flexibility, Difficulty walking, Decreased balance  Visit Diagnosis: Stiffness of left foot, not elsewhere classified  Localized edema  Difficulty in walking, not elsewhere classified  Pain in left ankle and joints of left foot     Problem List Patient Active Problem List   Diagnosis Date Noted  . Depression 09/26/2019  . Vitamin D deficiency 09/01/2019  . Class 3 severe obesity with serious comorbidity and body mass index (BMI) of 50.0 to 59.9 in adult ([place]) 09/01/2019  . Prediabetes 08/15/2019  . Atrial fibrillation ([place])   . Chest pain 04/22/2016  . Hypokalemia 04/22/2016  . Morbid obesity ([place]) 04/22/2016  . Hypertension 04/22/2016  . Paroxysmal A-fib ([place]) 04/22/2016  . Hypomagnesemia 04/22/2016    Scot Jun, PTA 01/09/2020, 2:30 PM  [address] [place], [place], [address] Phone: [phone]   Fax:  [phone]  Name: [name] MRN: [id number] Date of Birth: 1956-03-14

## 2020-01-11 ENCOUNTER — Ambulatory Visit: Payer: Medicare Other | Admitting: Physical Therapy

## 2020-01-18 ENCOUNTER — Ambulatory Visit: Payer: Medicare Other | Admitting: Physical Therapy

## 2020-01-18 ENCOUNTER — Encounter: Payer: Self-pay | Admitting: Physical Therapy

## 2020-01-18 ENCOUNTER — Other Ambulatory Visit: Payer: Self-pay

## 2020-01-18 DIAGNOSIS — R262 Difficulty in walking, not elsewhere classified: Secondary | ICD-10-CM

## 2020-01-18 DIAGNOSIS — R600 Localized edema: Secondary | ICD-10-CM

## 2020-01-18 DIAGNOSIS — M25572 Pain in left ankle and joints of left foot: Secondary | ICD-10-CM

## 2020-01-18 DIAGNOSIS — M25675 Stiffness of left foot, not elsewhere classified: Secondary | ICD-10-CM

## 2020-01-18 NOTE — Therapy (Signed)
[place], [place], [address] Phone: [phone]   Fax:  [phone]  Physical Therapy Treatment  Patient Details  Name: [name] MRN: [id number] Date of Birth: 1956/02/16 Referring Provider (PT): Fayette Pho, Hettick Cellar MD   Encounter Date: 01/18/2020  PT End of Session - 01/18/20 1340    Visit Number  15    Date for PT Re-Evaluation  02/09/20    PT Start Time  1300    PT Stop Time  1343    PT Time Calculation (min)  43 min    Activity Tolerance  Patient tolerated treatment well    Behavior During Therapy  [facility] for tasks assessed/performed       Past Medical History:  Diagnosis Date  . Atrial fibrillation ([place])   . Chronic lower back pain   . Depression   . DVT (deep venous thrombosis) (HCC) 1990s   LLE  . Fallen arches   . Hypertension   . Hyperthyroidism ~ 2000   "fine now" (04/23/2016)  . Joint pain   . Lactose intolerance   . Leg edema   . Obesity   . Pneumonia 1980s X 1  . Snoring    has not had sleep study but suspects sleep apnea    Past Surgical History:  Procedure Laterality Date  . CARDIOVERSION N/A 06/18/2016   Procedure: CARDIOVERSION;  Surgeon: Larey Dresser, MD;  Location: [place];  Service: Cardiovascular;  Laterality: N/A;  . CARDIOVERSION N/A 07/11/2016   Procedure: CARDIOVERSION;  Surgeon: Lelon Perla, MD;  Location: [facility] ENDOSCOPY;  Service: Cardiovascular;  Laterality: N/A;  . CARDIOVERSION N/A 07/02/2017   Procedure: CARDIOVERSION;  Surgeon: Sanda Klein, MD;  Location: [place] ENDOSCOPY;  Service: Cardiovascular;  Laterality: N/A;  . CARDIOVERSION N/A 09/29/2017   Procedure: CARDIOVERSION;  Surgeon: Larey Dresser, MD;  Location: [facility] ENDOSCOPY;  Service: Cardiovascular;  Laterality: N/A;  . ENDOMETRIAL ABLATION  ~2006  . TONSILLECTOMY  1960s  . TUBAL LIGATION  1979    There were no vitals filed for this visit.  Subjective Assessment -  01/18/20 1308    Subjective  Pt enters clinic ambulating with SPC.Reports some increase in LPB, L knee, and L calf. L calf is not warm but does have some tenderness with palpation.    Patient Stated Goals  walk better, lose weight , use a cane or less device    Currently in Pain?  Yes    Pain Score  5     Pain Location  Knee    Pain Orientation  Right    Pain Descriptors / Indicators  Tightness                       [facility] Adult PT Treatment/Exercise - 01/18/20 0001      Ambulation/Gait   Ambulation/Gait  Yes    Ambulation/Gait Assistance  6: Modified independent (Device/Increase time);5: Supervision    Assistive device  None    Gait Pattern  Step-through pattern    Gait Comments  20 ft then 66f      High Level Balance   High Level Balance Activities  Backward walking;Figure 8 turns      Ankle Exercises: Aerobic   Nustep  L3 LE only x 6 min       Ankle Exercises: Standing   Other Standing Ankle Exercises  standing march 5lb cuff SPC 2x10, Sit to stant 8lb 2x10  Other Standing Ankle Exercises  4 in step up LLE 2x5 with HHA       Ankle Exercises: Seated   Other Seated Ankle Exercises  LAQ 5lb 2x10 borh LE       Ankle Exercises: Plyometrics   Plyometric Exercises  Resisted gait 50lb  froward x3               PT Short Term Goals - 11/21/19 1423      PT SHORT TERM GOAL #1   Title  independent with initial HEP    Status  Achieved        PT Long Term Goals - 01/06/20 1143      PT LONG TERM GOAL #1   Title  increase AROM of the left ankle DF to 5 degrees    Status  On-going      PT LONG TERM GOAL #2   Title  decrease pain with standing 50%    Status  Achieved      PT LONG TERM GOAL #3   Title  walk with cane and shoe x 100 feet    Status  Partially Met      PT LONG TERM GOAL #4   Title  be able to wear a shoe 10 hours a day    Status  Achieved            Plan - 01/18/20 1340    Clinical Impression Statement  Increase fatigue  reported after today's session. She ambulates with forward flexed posture. Progressed to resisted marching with SPC some difficulty lifting L hip causing compensation. LLE weakness present with step ups, unable to fully extend L knee when stepping up with it. Did well increasing gait distance without AD.    Personal Factors and Comorbidities  Comorbidity 3+    Comorbidities  afib, obesity, depression    Examination-Activity Limitations  Squat;Stairs;Lift;Locomotion Level;Stand;Caring for Others;Carry;Toileting;Transfers    Examination-Participation Restrictions  Shop;Volunteer;Cleaning;Community Activity;Yard Work    [employer] officer  Evolving/Moderate complexity    Rehab Potential  Fair    PT Frequency  2x / week    PT Duration  8 weeks    PT Treatment/Interventions  ADLs/Self Care Home Management;Cryotherapy;Electrical Stimulation;Functional mobility training;Stair training;Gait training;Therapeutic activities;Therapeutic exercise;Balance training;Neuromuscular re-education;Manual techniques;Patient/family education;Passive range of motion;Vasopneumatic Device    PT Next Visit Plan  functional strenght and endurance.       Patient will benefit from skilled therapeutic intervention in order to improve the following deficits and impairments:  Abnormal gait, Pain, Postural dysfunction, Increased muscle spasms, Decreased mobility, Decreased coordination, Cardiopulmonary status limiting activity, Decreased activity tolerance, Decreased endurance, Decreased range of motion, Decreased strength, Impaired flexibility, Difficulty walking, Decreased balance  Visit Diagnosis: Stiffness of left foot, not elsewhere classified  Pain in left ankle and joints of left foot  Localized edema  Difficulty in walking, not elsewhere classified     Problem List Patient Active Problem List   Diagnosis Date Noted  . Depression 09/26/2019  . Vitamin D deficiency 09/01/2019  . Class 3 severe  obesity with serious comorbidity and body mass index (BMI) of 50.0 to 59.9 in adult ([place]) 09/01/2019  . Prediabetes 08/15/2019  . Atrial fibrillation ([place])   . Chest pain 04/22/2016  . Hypokalemia 04/22/2016  . Morbid obesity ([place]) 04/22/2016  . Hypertension 04/22/2016  . Paroxysmal A-fib ([place]) 04/22/2016  . Hypomagnesemia 04/22/2016    Scot Jun, PTA 01/18/2020, 1:44 PM  [facility] [phone] [address] [place]  [place], [place], [address] Phone: [phone]   Fax:  [phone]  Name: [name] MRN: [id number] Date of Birth: May 20, 1956

## 2020-01-19 ENCOUNTER — Ambulatory Visit (INDEPENDENT_AMBULATORY_CARE_PROVIDER_SITE_OTHER): Payer: Medicare Other | Admitting: Family Medicine

## 2020-01-19 ENCOUNTER — Encounter (INDEPENDENT_AMBULATORY_CARE_PROVIDER_SITE_OTHER): Payer: Self-pay | Admitting: Family Medicine

## 2020-01-19 ENCOUNTER — Other Ambulatory Visit: Payer: Self-pay

## 2020-01-19 VITALS — BP 128/78 | HR 80 | Temp 98.4°F | Ht 63.0 in | Wt 339.0 lb

## 2020-01-19 DIAGNOSIS — E559 Vitamin D deficiency, unspecified: Secondary | ICD-10-CM | POA: Diagnosis not present

## 2020-01-19 DIAGNOSIS — F3289 Other specified depressive episodes: Secondary | ICD-10-CM

## 2020-01-19 DIAGNOSIS — Z6841 Body Mass Index (BMI) 40.0 and over, adult: Secondary | ICD-10-CM

## 2020-01-19 MED ORDER — BUPROPION HCL ER (SR) 150 MG PO TB12: 150.0000 mg | ORAL_TABLET | Freq: Every day | ORAL | 0 refills | Status: DC

## 2020-01-19 MED ORDER — VITAMIN D (ERGOCALCIFEROL) 1.25 MG (50000 UNIT) PO CAPS: 50000.0000 [IU] | ORAL_CAPSULE | ORAL | 0 refills | Status: DC

## 2020-01-20 ENCOUNTER — Encounter: Payer: Self-pay | Admitting: Physical Therapy

## 2020-01-20 ENCOUNTER — Ambulatory Visit: Payer: Medicare Other | Admitting: Physical Therapy

## 2020-01-20 DIAGNOSIS — R262 Difficulty in walking, not elsewhere classified: Secondary | ICD-10-CM

## 2020-01-20 DIAGNOSIS — R600 Localized edema: Secondary | ICD-10-CM

## 2020-01-20 DIAGNOSIS — M25572 Pain in left ankle and joints of left foot: Secondary | ICD-10-CM

## 2020-01-20 DIAGNOSIS — M25675 Stiffness of left foot, not elsewhere classified: Secondary | ICD-10-CM

## 2020-01-20 NOTE — Therapy (Signed)
[place], [place], [address] Phone: [phone]   Fax:  [phone]  Physical Therapy Treatment  Patient Details  Name: [name] MRN: [id number] Date of Birth: [birth year] Referring Provider (PT): Fayette Pho, Tiburones Cellar MD   Encounter Date: 01/20/2020  PT End of Session - 01/20/20 1135    Visit Number  16    Date for PT Re-Evaluation  02/09/20    PT Start Time  1051    PT Stop Time  1133    PT Time Calculation (min)  42 min    Activity Tolerance  Patient tolerated treatment well    Behavior During Therapy  [facility] for tasks assessed/performed       Past Medical History:  Diagnosis Date  . Atrial fibrillation ([place])   . Chronic lower back pain   . Depression   . DVT (deep venous thrombosis) (HCC) 1990s   LLE  . Fallen arches   . Hypertension   . Hyperthyroidism ~ 2000   "fine now" (04/23/2016)  . Joint pain   . Lactose intolerance   . Leg edema   . Obesity   . Pneumonia 1980s X 1  . Snoring    has not had sleep study but suspects sleep apnea    Past Surgical History:  Procedure Laterality Date  . CARDIOVERSION N/A 06/18/2016   Procedure: CARDIOVERSION;  Surgeon: Larey Dresser, MD;  Location: [place];  Service: Cardiovascular;  Laterality: N/A;  . CARDIOVERSION N/A 07/11/2016   Procedure: CARDIOVERSION;  Surgeon: Lelon Perla, MD;  Location: [facility] ENDOSCOPY;  Service: Cardiovascular;  Laterality: N/A;  . CARDIOVERSION N/A 07/02/2017   Procedure: CARDIOVERSION;  Surgeon: Sanda Klein, MD;  Location: [place] ENDOSCOPY;  Service: Cardiovascular;  Laterality: N/A;  . CARDIOVERSION N/A 09/29/2017   Procedure: CARDIOVERSION;  Surgeon: Larey Dresser, MD;  Location: [facility] ENDOSCOPY;  Service: Cardiovascular;  Laterality: N/A;  . ENDOMETRIAL ABLATION  ~2006  . TONSILLECTOMY  1960s  . TUBAL LIGATION  1979    There were no vitals filed for this visit.  Subjective Assessment -  01/20/20 1046    Subjective  "About wore out" "Been trying to work on my breathing" pt report  having a lot to do over the past few days    Currently in Pain?  Yes    Pain Score  3     Pain Location  Ankle    Pain Orientation  Right         [facility] PT Assessment - 01/20/20 0001      AROM   Right/Left Ankle  Left    Left Ankle Dorsiflexion  6    Left Ankle Plantar Flexion  40    Left Ankle Inversion  11    Left Ankle Eversion  10                   [facility] Adult PT Treatment/Exercise - 01/20/20 0001      Ambulation/Gait   Ambulation/Gait  Yes    Ambulation/Gait Assistance  6: Modified independent (Device/Increase time);5: Supervision    Ambulation Distance (Feet)  46 Feet    Assistive device  None    Gait Pattern  Step-through pattern    Gait Comments  in long has       High Level Balance   High Level Balance Activities  Side stepping      Ankle Exercises: Aerobic   Elliptical  I1 R5 x  1 min    Nustep  L3 LE only x 6 min       Ankle Exercises: Standing   Other Standing Ankle Exercises  Forward step ups 4 in LLE 2x5     Other Standing Ankle Exercises  Resisted gait forward & backwars 50lb x3, 30lb side step x3 each                PT Short Term Goals - 11/21/19 1423      PT SHORT TERM GOAL #1   Title  independent with initial HEP    Status  Achieved        PT Long Term Goals - 01/20/20 1139      PT LONG TERM GOAL #1   Title  increase AROM of the left ankle DF to 5 degrees    Status  Achieved      PT LONG TERM GOAL #2   Title  decrease pain with standing 50%    Status  Achieved      PT LONG TERM GOAL #3   Title  walk with cane and shoe x 100 feet    Status  Partially Met      PT LONG TERM GOAL #4   Title  be able to wear a shoe 10 hours a day    Status  Achieved            Plan - 01/20/20 1136    Clinical Impression Statement  Increased fatigue noted with today's activities. Resisted gait was really taxing on pt needing seated   rest between sets. She has progressed increasing her L ankle AROM some, but motion remains limited overall. Again cues to step through LLE with step ups. Increased single trial gait distance without AD in hall.    Personal Factors and Comorbidities  Comorbidity 3+    Comorbidities  afib, obesity, depression    Examination-Activity Limitations  Squat;Stairs;Lift;Locomotion Level;Stand;Caring for Others;Carry;Toileting;Transfers    Examination-Participation Restrictions  Shop;Volunteer;Cleaning;Community Activity;Yard Work    [employer] officer  Evolving/Moderate complexity    Rehab Potential  Fair    PT Frequency  2x / week    PT Duration  8 weeks    PT Treatment/Interventions  ADLs/Self Care Home Management;Cryotherapy;Electrical Stimulation;Functional mobility training;Stair training;Gait training;Therapeutic activities;Therapeutic exercise;Balance training;Neuromuscular re-education;Manual techniques;Patient/family education;Passive range of motion;Vasopneumatic Device    PT Next Visit Plan  functional strenght and endurance.       Patient will benefit from skilled therapeutic intervention in order to improve the following deficits and impairments:  Abnormal gait, Pain, Postural dysfunction, Increased muscle spasms, Decreased mobility, Decreased coordination, Cardiopulmonary status limiting activity, Decreased activity tolerance, Decreased endurance, Decreased range of motion, Decreased strength, Impaired flexibility, Difficulty walking, Decreased balance  Visit Diagnosis: Localized edema  Pain in left ankle and joints of left foot  Difficulty in walking, not elsewhere classified  Stiffness of left foot, not elsewhere classified     Problem List Patient Active Problem List   Diagnosis Date Noted  . Depression 09/26/2019  . Vitamin D deficiency 09/01/2019  . Class 3 severe obesity with serious comorbidity and body mass index (BMI) of 50.0 to 59.9 in adult ([place])  09/01/2019  . Prediabetes 08/15/2019  . Atrial fibrillation ([place])   . Chest pain 04/22/2016  . Hypokalemia 04/22/2016  . Morbid obesity ([place]) 04/22/2016  . Hypertension 04/22/2016  . Paroxysmal A-fib ([place]) 04/22/2016  . Hypomagnesemia 04/22/2016    Scot Jun 01/20/2020, 11:40 AM  [place]  [address] [place], [place], [address] Phone: [phone]   Fax:  [phone]  Name: [name] MRN: [id number] Date of Birth: 21-Apr-1956

## 2020-01-21 ENCOUNTER — Ambulatory Visit: Payer: Medicare Other

## 2020-01-23 ENCOUNTER — Encounter (INDEPENDENT_AMBULATORY_CARE_PROVIDER_SITE_OTHER): Payer: Self-pay | Admitting: Family Medicine

## 2020-01-23 ENCOUNTER — Ambulatory Visit: Payer: Medicare Other | Attending: Internal Medicine

## 2020-01-23 ENCOUNTER — Other Ambulatory Visit (HOSPITAL_COMMUNITY): Payer: Self-pay | Admitting: Nurse Practitioner

## 2020-01-23 ENCOUNTER — Encounter (INDEPENDENT_AMBULATORY_CARE_PROVIDER_SITE_OTHER): Payer: Self-pay

## 2020-01-23 DIAGNOSIS — Z23 Encounter for immunization: Secondary | ICD-10-CM

## 2020-01-23 NOTE — Progress Notes (Signed)
   Covid-19 Vaccination Clinic  Name:  ALEXZANDRIA MASSMAN    MRN: [id number] DOB: [birth year]  01/23/2020  Ms. Randa was observed post Covid-19 immunization for 15 minutes without incident. She was provided with Vaccine Information Sheet and instruction to access the V-Safe system.   Ms. Tiley was instructed to call 911 with any severe reactions post vaccine: Marland Kitchen Difficulty breathing  . Swelling of face and throat  . A fast heartbeat  . A bad rash all over body  . Dizziness and weakness   Immunizations Administered    Name Date Dose VIS Date Route   Pfizer COVID-19 Vaccine 01/23/2020  4:12 PM 0.3 mL 10/07/2019 Intramuscular   Manufacturer: [employer], [employer]   Lot: [id number]   NDC: [address]-5

## 2020-01-23 NOTE — Progress Notes (Signed)
   Chief Complaint:   OBESITY [name] is here to discuss her progress with her obesity treatment plan along with follow-up of her obesity related diagnoses. [name] is on keeping a food journal and adhering to recommended goals of 1300-1600 calories and 80-90 grams of protein daily and states she is following her eating plan approximately 85% of the time. [name] states she is doing physical therapy for 45 minutes 2 times per week.  Today's visit was #: 19 Starting weight: 296 lbs Starting date: 02/08/2018 Today's weight: 339 lbs Today's date: 01/19/2020 Total lbs lost to date: 0 Total lbs lost since last in-office visit: 0  Interim History: [name] has journaled 4-5 days per week. On the days she journaled she met her protein and calorie goals. She reports her husband buys things at the grocery store that are not good for her plan. He will not let her order groceries online. She admits to skipping meals.   Subjective:   1. Vitamin D deficiency [name]'s Vit D level is nearly at goal (49). She is on prescription Vit D.  2. Other depression, with emotional eating [name] reports stress eating is well controlled.  Assessment/Plan:   1. Vitamin D deficiency Low Vitamin D level contributes to fatigue and are associated with obesity, breast, and colon cancer. We will refill prescription Vitamin D for 1 month. [name] will follow-up for routine testing of Vitamin D, at least 2-3 times per year to avoid over-replacement.  - Vitamin D, Ergocalciferol, (DRISDOL) 1.25 MG (50000 UNIT) CAPS capsule; Take 1 capsule (50,000 Units total) by mouth every 7 (seven) days.  Dispense: 4 capsule; Refill: 0  2. Other depression, with emotional eating Behavior modification techniques were discussed today to help [name] deal with her emotional/non-hunger eating behaviors. We will refill bupropion for 1 month. Orders and follow up as documented in patient record.   - buPROPion (WELLBUTRIN SR) 150 MG 12 hr tablet;  Take 1 tablet (150 mg total) by mouth daily.  Dispense: 30 tablet; Refill: 0  3. Class 3 severe obesity with serious comorbidity and body mass index (BMI) of 60.0 to 69.9 in adult, unspecified obesity type (HCC) [name] is currently in the action stage of change. As such, her goal is to continue with weight loss efforts. She has agreed to the Category 3 Plan or keeping a food journal and adhering to recommended goals of 1300-1600 calories and 80-90 grams of protein daily.   [name] is to journal 6 out of 7 days per week or follow Category 3 plan. Encouraged [name] to try to go to grocery store with her husband even if she needs to use a scooter.   Exercise goals: Continue physical therapy.  Behavioral modification strategies: increasing lean protein intake, decreasing simple carbohydrates, no skipping meals and keeping a strict food journal.  [name] has agreed to follow-up with our clinic in 3 weeks. She was informed of the importance of frequent follow-up visits to maximize her success with intensive lifestyle modifications for her multiple health conditions.   Objective:   Blood pressure 128/78, pulse 80, temperature 98.4 F (36.9 C), temperature source Oral, height 5' 3" (1.6 m), weight (!) 339 lb (153.8 kg), SpO2 98 %. Body mass index is 60.05 kg/m.  General: Cooperative, alert, well developed, in no acute distress. HEENT: Conjunctivae and lids unremarkable. Cardiovascular: Regular rhythm.  Lungs: Normal work of breathing. Neurologic: No focal deficits.   Lab Results  Component Value Date   CREATININE 0.83 11/17/2019   BUN 25   11/17/2019   NA 144 11/17/2019   K 4.6 11/17/2019   CL 104 11/17/2019   CO2 21 11/17/2019   Lab Results  Component Value Date   ALT 11 11/17/2019   AST 18 11/17/2019   ALKPHOS 56 11/17/2019   BILITOT 0.3 11/17/2019   Lab Results  Component Value Date   HGBA1C 5.6 11/17/2019   HGBA1C 5.8 (H) 08/11/2019   HGBA1C 5.5 06/30/2018   HGBA1C 5.7 (H)  02/08/2018   Lab Results  Component Value Date   INSULIN 9.2 11/17/2019   INSULIN 14.4 08/11/2019   INSULIN 4.8 06/30/2018   INSULIN 5.8 02/08/2018   Lab Results  Component Value Date   TSH 3.710 08/11/2019   Lab Results  Component Value Date   CHOL 166 02/08/2018   HDL 75 02/08/2018   LDLCALC 76 02/08/2018   TRIG 75 02/08/2018   CHOLHDL 2.2 02/08/2018   Lab Results  Component Value Date   WBC 11.2 (H) 08/24/2018   HGB 12.9 08/24/2018   HCT 40.3 08/24/2018   MCV 84.7 08/24/2018   PLT 471 (H) 08/24/2018   No results found for: IRON, TIBC, FERRITIN  Obesity Behavioral Intervention Documentation for Insurance:   Approximately 15 minutes were spent on the discussion below.  ASK: We discussed the diagnosis of obesity with [name] today and [name] agreed to give [place] permission to discuss obesity behavioral modification therapy today.  ASSESS: [name] has the diagnosis of obesity and her BMI today is 60.07. [name] is in the action stage of change.   ADVISE: [name] was educated on the multiple health risks of obesity as well as the benefit of weight loss to improve her health. She was advised of the need for long term treatment and the importance of lifestyle modifications to improve her current health and to decrease her risk of future health problems.  AGREE: Multiple dietary modification options and treatment options were discussed and [name] agreed to follow the recommendations documented in the above note.  ARRANGE: [name] was educated on the importance of frequent visits to treat obesity as outlined per CMS and USPSTF guidelines and agreed to schedule her next follow up appointment today.  Attestation Statements:   Reviewed by clinician on day of visit: allergies, medications, problem list, medical history, surgical history, family history, social history, and previous encounter notes.   Wilhemena Durie, am acting as Location manager for [employer], FNP-C.  I  have reviewed the above documentation for accuracy and completeness, and I agree with the above. -  Georgianne Fick, FNP

## 2020-01-24 ENCOUNTER — Ambulatory Visit: Payer: Medicare Other | Admitting: Physical Therapy

## 2020-01-31 ENCOUNTER — Ambulatory Visit: Payer: Medicare Other | Admitting: Physical Therapy

## 2020-02-01 ENCOUNTER — Emergency Department (HOSPITAL_COMMUNITY)
Admission: EM | Admit: 2020-02-01 | Discharge: 2020-02-01 | Disposition: A | Discharge status: Home / Self Care | Payer: Medicare Other | Attending: Emergency Medicine | Admitting: Emergency Medicine

## 2020-02-01 ENCOUNTER — Encounter (HOSPITAL_COMMUNITY): Payer: Self-pay | Admitting: Emergency Medicine

## 2020-02-01 ENCOUNTER — Other Ambulatory Visit: Payer: Self-pay

## 2020-02-01 ENCOUNTER — Emergency Department (HOSPITAL_BASED_OUTPATIENT_CLINIC_OR_DEPARTMENT_OTHER): Payer: Medicare Other

## 2020-02-01 DIAGNOSIS — F1210 Cannabis abuse, uncomplicated: Secondary | ICD-10-CM | POA: Insufficient documentation

## 2020-02-01 DIAGNOSIS — L539 Erythematous condition, unspecified: Secondary | ICD-10-CM | POA: Diagnosis not present

## 2020-02-01 DIAGNOSIS — I10 Essential (primary) hypertension: Secondary | ICD-10-CM | POA: Insufficient documentation

## 2020-02-01 DIAGNOSIS — R2242 Localized swelling, mass and lump, left lower limb: Secondary | ICD-10-CM | POA: Insufficient documentation

## 2020-02-01 DIAGNOSIS — Z7901 Long term (current) use of anticoagulants: Secondary | ICD-10-CM | POA: Diagnosis not present

## 2020-02-01 DIAGNOSIS — M79662 Pain in left lower leg: Secondary | ICD-10-CM | POA: Diagnosis present

## 2020-02-01 DIAGNOSIS — M7989 Other specified soft tissue disorders: Secondary | ICD-10-CM | POA: Diagnosis not present

## 2020-02-01 DIAGNOSIS — Z87891 Personal history of nicotine dependence: Secondary | ICD-10-CM | POA: Insufficient documentation

## 2020-02-01 DIAGNOSIS — Z79899 Other long term (current) drug therapy: Secondary | ICD-10-CM | POA: Insufficient documentation

## 2020-02-01 DIAGNOSIS — R609 Edema, unspecified: Secondary | ICD-10-CM | POA: Diagnosis not present

## 2020-02-01 NOTE — Progress Notes (Signed)
Lower extremity venous has been completed.   Preliminary results in CV Proc.   Blanch Media 02/01/2020 11:54 AM

## 2020-02-01 NOTE — Discharge Instructions (Signed)
Recommend trial of compression stockings, elevating legs at night.  Please schedule follow-up appointment with your primary doctor.  Please keep your appointment with the cardiologist later this week.  Return to ER if you develop any difficulty breathing, chest pain, worsening redness, fever or other new concerning symptom.

## 2020-02-01 NOTE — ED Provider Notes (Signed)
[facility]-EMERGENCY DEPT Provider Note   CSN: [id number] Arrival date & time: 02/01/20  1102     History Chief Complaint  Patient presents with  . Leg Swelling    [name] is a 64 y.o. female.  Presents to ER with chief complaint of left lower leg pain and swelling.  Patient has history of complex ankle surgery, posterior tib artery aneurysm.  Followed by [facility].  Ever since patient had surgery, patient has had some mild amount of swelling in her left leg.  She is concerned that the swelling may have increased recently.  Specifically over the past week or so.  She called her [facility] clinic about a as well as schedule follow-up appointment with her primary doctor however the [facility] recommended she go immediately to ER to have ultrasound performed to rule out DVT.  Patient thinks that she may have a slight amount of redness on her left leg but no fever.  Normal ROM.  Able to ambulate.  Denies any new sob or chest pain.  Obtain additional history from chart review, patient has long documented history of some mild left leg swelling since surgery last fall.  HPI     Past Medical History:  Diagnosis Date  . Atrial fibrillation (HCC)   . Chronic lower back pain   . Depression   . DVT (deep venous thrombosis) (HCC) 1990s   LLE  . Fallen arches   . Hypertension   . Hyperthyroidism ~ 2000   "fine now" (04/23/2016)  . Joint pain   . Lactose intolerance   . Leg edema   . Obesity   . Pneumonia 1980s X 1  . Snoring    has not had sleep study but suspects sleep apnea    Patient Active Problem List   Diagnosis Date Noted  . Depression 09/26/2019  . Vitamin D deficiency 09/01/2019  . Class 3 severe obesity with serious comorbidity and body mass index (BMI) of 60.0 to 69.9 in adult (HCC) 09/01/2019  . Prediabetes 08/15/2019  . Atrial fibrillation (HCC)   . Chest pain 04/22/2016  . Hypokalemia 04/22/2016  . Morbid obesity (HCC) 04/22/2016  .  Hypertension 04/22/2016  . Paroxysmal A-fib (HCC) 04/22/2016  . Hypomagnesemia 04/22/2016    Past Surgical History:  Procedure Laterality Date  . CARDIOVERSION N/A 06/18/2016   Procedure: CARDIOVERSION;  Surgeon: Laurey Morale, MD;  Location: [facility] ENDOSCOPY;  Service: Cardiovascular;  Laterality: N/A;  . CARDIOVERSION N/A 07/11/2016   Procedure: CARDIOVERSION;  Surgeon: Lewayne Bunting, MD;  Location: [facility] ENDOSCOPY;  Service: Cardiovascular;  Laterality: N/A;  . CARDIOVERSION N/A 07/02/2017   Procedure: CARDIOVERSION;  Surgeon: Thurmon Fair, MD;  Location: [facility] ENDOSCOPY;  Service: Cardiovascular;  Laterality: N/A;  . CARDIOVERSION N/A 09/29/2017   Procedure: CARDIOVERSION;  Surgeon: Laurey Morale, MD;  Location: [facility] ENDOSCOPY;  Service: Cardiovascular;  Laterality: N/A;  . ENDOMETRIAL ABLATION  ~2006  . TONSILLECTOMY  1960s  . TUBAL LIGATION  1979     OB History    Gravida  2   Para      Term      Preterm      AB      Living  2     SAB      TAB      Ectopic      Multiple      Live Births              Family History  Problem Relation Age of Onset  . Diabetes Mellitus I Father   . CVA Father   . Arthritis Mother   . Obesity Mother   . Thyroid disease Mother   . Liver disease Mother   . CAD Maternal Grandfather     Social History   Tobacco Use  . Smoking status: Former Smoker    Packs/day: 0.75    Years: 10.00    Pack years: 7.50    Types: Cigarettes  . Smokeless tobacco: Never Used  . Tobacco comment: 04/23/2016 "stopped smoking cigarettes in ~ 1989"  Substance Use Topics  . Alcohol use: Yes    Alcohol/week: 4.0 standard drinks    Types: 4 Glasses of wine per week    Comment: recently quit  . Drug use: Yes    Types: Marijuana    Comment: 04/23/2016 "recreational marijuana in my late teens/early 20's"    Home Medications Prior to Admission medications   Medication Sig Start Date End Date Taking? Authorizing Provider  Ascorbic Acid (VITAMIN  C) 1000 MG tablet Take 1,000 mg by mouth daily.    [provider]  buPROPion (WELLBUTRIN SR) 150 MG 12 hr tablet Take 1 tablet (150 mg total) by mouth daily. 01/19/20   Whitmire, Joneen Boers, FNP  DULoxetine (CYMBALTA) 30 MG capsule Take 90 mg by mouth daily.    [provider]  losartan-hydrochlorothiazide (HYZAAR) 100-25 MG tablet Take 1 tablet by mouth daily. 07/31/16   Patsey Berthold, NP  metoprolol tartrate (LOPRESSOR) 25 MG tablet Take 0.5 tablets (12.5 mg total) by mouth 2 (two) times daily. Appointment Required For Further Refills [phone] 01/23/20   Sherran Needs, NP  Vitamin D, Ergocalciferol, (DRISDOL) 1.25 MG (50000 UNIT) CAPS capsule Take 1 capsule (50,000 Units total) by mouth every 7 (seven) days. 01/19/20   Whitmire, Dawn W, FNP  XARELTO 20 MG TABS tablet Take 20 mg by mouth daily with supper.  05/01/16   [provider]    Allergies    Penicillins and Codeine  Review of Systems   Review of Systems  Constitutional: Negative for chills and fever.  HENT: Negative for ear pain and sore throat.   Eyes: Negative for pain and visual disturbance.  Respiratory: Negative for cough and shortness of breath.   Cardiovascular: Negative for chest pain and palpitations.  Gastrointestinal: Negative for abdominal pain and vomiting.  Genitourinary: Negative for dysuria and hematuria.  Musculoskeletal: Negative for arthralgias and back pain.  Skin: Negative for color change and rash.  Neurological: Negative for seizures and syncope.  All other systems reviewed and are negative.   Physical Exam Updated Vital Signs BP 115/74   Pulse (!) 45   Temp 97.6 F (36.4 C) (Oral)   Resp 19   Ht 5\' 3"  (1.6 m)   Wt (!) 153.8 kg   SpO2 97%   BMI 60.05 kg/m   Physical Exam Vitals and nursing note reviewed.  Constitutional:      General: She is not in acute distress.    Appearance: She is well-developed.  HENT:     Head: Normocephalic and atraumatic.  Eyes:      Conjunctiva/sclera: Conjunctivae normal.  Cardiovascular:     Rate and Rhythm: Normal rate and regular rhythm.     Heart sounds: No murmur.  Pulmonary:     Effort: Pulmonary effort is normal. No respiratory distress.     Breath sounds: Normal breath sounds.  Abdominal:     Palpations: Abdomen is soft.  Tenderness: There is no abdominal tenderness.  Musculoskeletal:     Cervical back: Neck supple.     Comments: Mild nonpitting edema noted to distal lower leg, ankle, noted old healed incision on medial lower leg, no overlying erythema, normal joint ROM  Skin:    General: Skin is warm and dry.     Capillary Refill: Capillary refill takes less than 2 seconds.  Neurological:     General: No focal deficit present.     Mental Status: She is alert and oriented to person, place, and time.     ED Results / Procedures / Treatments   Labs (all labs ordered are listed, but only abnormal results are displayed) Labs Reviewed - No data to display  EKG None  Radiology VAS [place] LOWER EXTREMITY VENOUS (DVT) ([facility] and [facility] 7a-7p)  Result Date: 02/01/2020  Lower Venous DVTStudy Indications: Swelling, and Edema.  Limitations: Body habitus and poor ultrasound/tissue interface. Comparison Study: no prior Performing Technologist: Blanch Media RVS  Examination Guidelines: A complete evaluation includes B-mode imaging, spectral Doppler, color Doppler, and power Doppler as needed of all accessible portions of each vessel. Bilateral testing is considered an integral part of a complete examination. Limited examinations for reoccurring indications may be performed as noted. The reflux portion of the exam is performed with the patient in reverse Trendelenburg.  +-----+---------------+---------+-----------+----------+--------------+ RIGHTCompressibilityPhasicitySpontaneityPropertiesThrombus Aging +-----+---------------+---------+-----------+----------+--------------+ CFV  Full           Yes      Yes                                  +-----+---------------+---------+-----------+----------+--------------+   +---------+---------------+---------+-----------+----------+--------------+ LEFT     CompressibilityPhasicitySpontaneityPropertiesThrombus Aging +---------+---------------+---------+-----------+----------+--------------+ CFV      Full           Yes      Yes                                 +---------+---------------+---------+-----------+----------+--------------+ SFJ      Full                                                        +---------+---------------+---------+-----------+----------+--------------+ FV Prox  Full                                                        +---------+---------------+---------+-----------+----------+--------------+ FV Mid   Full                                                        +---------+---------------+---------+-----------+----------+--------------+ FV DistalFull                                                        +---------+---------------+---------+-----------+----------+--------------+ PFV  Full                                                        +---------+---------------+---------+-----------+----------+--------------+ POP      Full           Yes      Yes                                 +---------+---------------+---------+-----------+----------+--------------+ PTV      Full                                                        +---------+---------------+---------+-----------+----------+--------------+ PERO                                                  Not visualized +---------+---------------+---------+-----------+----------+--------------+     Summary: RIGHT: - No evidence of common femoral vein obstruction.  LEFT: - There is no evidence of deep vein thrombosis in the lower extremity.  - No cystic structure found in the popliteal fossa.  *See table(s) above for measurements and  observations.    Preliminary     Procedures Procedures (including critical care time)  Medications Ordered in ED Medications - No data to display  ED Course  I have reviewed the triage vital signs and the nursing notes.  Pertinent labs & imaging results that were available during my care of the patient were reviewed by me and considered in my medical decision making (see chart for details).    MDM Rules/Calculators/A&P                      64 year old lady presenting to ER with concern for left leg edema, possible blood clot.  Sent by her Ortho clinic to rule out DVT.  On exam she has relatively mild swelling in this extremity, nonpitting.  No evidence for cellulitis.  Low suspicion for heart failure.  No associated difficulty breathing.  At the DVT study was negative.  On chart review, I see that this concern appears to be more chronic and less acute.  She has a appointment already scheduled with her cardiologist later this week.  Additionally recommended that she follow-up with primary care doctor.  Reviewed precautions, discharged home.    After the discussed management above, the patient was determined to be safe for discharge.  The patient was in agreement with this plan and all questions regarding their care were answered.  ED return precautions were discussed and the patient will return to the ED with any significant worsening of condition.   Final Clinical Impression(s) / ED Diagnoses Final diagnoses:  Peripheral edema    Rx / DC Orders ED Discharge Orders    None       Milagros Loll, MD 02/02/20 [phone]

## 2020-02-01 NOTE — ED Triage Notes (Signed)
Patient states she called her MD at [facility] and told her she needed to come to the hospital due to her L lower leg pain and swelling. Patient endorses SOB x1 month.

## 2020-02-02 ENCOUNTER — Ambulatory Visit: Payer: Medicare Other | Admitting: Physical Therapy

## 2020-02-03 ENCOUNTER — Encounter: Payer: Self-pay | Admitting: Internal Medicine

## 2020-02-03 ENCOUNTER — Ambulatory Visit: Payer: Medicare Other | Admitting: Internal Medicine

## 2020-02-03 ENCOUNTER — Other Ambulatory Visit: Payer: Self-pay

## 2020-02-03 VITALS — BP 142/76 | HR 79 | Ht 62.0 in | Wt 343.0 lb

## 2020-02-03 DIAGNOSIS — I10 Essential (primary) hypertension: Secondary | ICD-10-CM

## 2020-02-03 DIAGNOSIS — R0602 Shortness of breath: Secondary | ICD-10-CM

## 2020-02-03 DIAGNOSIS — I4811 Longstanding persistent atrial fibrillation: Secondary | ICD-10-CM | POA: Diagnosis not present

## 2020-02-03 DIAGNOSIS — R0683 Snoring: Secondary | ICD-10-CM

## 2020-02-03 NOTE — Progress Notes (Signed)
PCP: Harlan Stains, MD Primary EP: Dr Rayann Heman  [name] is a 64 y.o. female who presents today for routine electrophysiology followup.  Since last being seen in our clinic, the patient reports doing reasonably well. She has had a long struggle with multiple surgeries related to osteomyelitis of her foot.  She has gained back some weight because of immobility issues but these are improving. She is working with PT and has recently re-established with the weight loss clinic.  She is motivated to lose weight again - she had previously lost down to 269lb prior to surgery with significant improvement in energy level and shortness of breath. She has had increased shortness of breath with increased activity since gaining the weight back and being immobile.   Today, she denies symptoms of palpitations, chest pain, dizziness, presyncope, or syncope.  The patient is otherwise without complaint today.   Past Medical History:  Diagnosis Date  . Atrial fibrillation ([place])   . Chronic lower back pain   . Depression   . DVT (deep venous thrombosis) (HCC) 1990s   LLE  . Fallen arches   . Hypertension   . Hyperthyroidism ~ 2000   "fine now" (04/23/2016)  . Joint pain   . Lactose intolerance   . Leg edema   . Obesity   . Pneumonia 1980s X 1  . Snoring    has not had sleep study but suspects sleep apnea   Past Surgical History:  Procedure Laterality Date  . CARDIOVERSION N/A 06/18/2016   Procedure: CARDIOVERSION;  Surgeon: Larey Dresser, MD;  Location: [facility];  Service: Cardiovascular;  Laterality: N/A;  . CARDIOVERSION N/A 07/11/2016   Procedure: CARDIOVERSION;  Surgeon: Lelon Perla, MD;  Location: [facility] ENDOSCOPY;  Service: Cardiovascular;  Laterality: N/A;  . CARDIOVERSION N/A 07/02/2017   Procedure: CARDIOVERSION;  Surgeon: Sanda Klein, MD;  Location: [place] ENDOSCOPY;  Service: Cardiovascular;  Laterality: N/A;  . CARDIOVERSION N/A 09/29/2017   Procedure: CARDIOVERSION;  Surgeon:  Larey Dresser, MD;  Location: [facility] ENDOSCOPY;  Service: Cardiovascular;  Laterality: N/A;  . ENDOMETRIAL ABLATION  ~2006  . TONSILLECTOMY  1960s  . TUBAL LIGATION  1979    ROS- all systems are reviewed and negatives except as per HPI above  Current Outpatient Medications  Medication Sig Dispense Refill  . acetaminophen (TYLENOL) 325 MG tablet Take 2 tablets by mouth as needed for pain.    Marland Kitchen albuterol (VENTOLIN HFA) 108 (90 Base) MCG/ACT inhaler Inhale 1 puff into the lungs as needed for shortness of breath.    . Ascorbic Acid (VITAMIN C) 1000 MG tablet Take 1,000 mg by mouth daily.    Marland Kitchen buPROPion (WELLBUTRIN SR) 150 MG 12 hr tablet Take 1 tablet (150 mg total) by mouth daily. 30 tablet 0  . DULoxetine (CYMBALTA) 30 MG capsule Take 90 mg by mouth daily.    . DULoxetine (CYMBALTA) 60 MG capsule Take 60 mg by mouth daily.    Marland Kitchen gabapentin (NEURONTIN) 400 MG capsule Take 400 mg by mouth 3 (three) times daily.    Marland Kitchen losartan-hydrochlorothiazide (HYZAAR) 100-25 MG tablet Take 1 tablet by mouth daily.    . metoprolol tartrate (LOPRESSOR) 25 MG tablet Take 0.5 tablets (12.5 mg total) by mouth 2 (two) times daily. Appointment Required For Further Refills [phone] 30 tablet 0  . triamterene-hydrochlorothiazide (MAXZIDE-25) 37.5-25 MG tablet Take 0.5 tablets by mouth every morning.    . Vitamin D, Ergocalciferol, (DRISDOL) 1.25 MG (50000 UNIT) CAPS capsule Take 1  capsule (50,000 Units total) by mouth every 7 (seven) days. 4 capsule 0  . XARELTO 20 MG TABS tablet Take 20 mg by mouth daily with supper.      No current facility-administered medications for this visit.    Physical Exam: Vitals:   02/03/20 1109  BP: (!) 142/76  Pulse: 79  SpO2: 90%  Weight: (!) 343 lb (155.6 kg)  Height: 5\' 2"  (1.575 m)    GEN- The patient is obese appearing, alert and oriented x 3 today.   Head- normocephalic, atraumatic Eyes-  Sclera clear, conjunctiva pink Ears- hearing intact Oropharynx- clear Lungs-  Clear to ausculation bilaterally, normal work of breathing Heart- Irregular rate and rhythm  GI- soft, NT, ND, + BS Extremities- no clubbing, cyanosis, or edema  Wt Readings from Last 3 Encounters:  02/03/20 (!) 343 lb (155.6 kg)  02/01/20 (!) 339 lb (153.8 kg)  01/19/20 (!) 339 lb (153.8 kg)    EKG tracing ordered today is personally reviewed and shows afib with PVCs  Assessment and Plan:  1.  Longstanding persistent atrial fibrillation Relatively asymptomatic Continue Xarelto for CHADS2VASC of 2  2.  Obesity Body mass index is 62.74 kg/m. She was significantly symptomatically improved when she was able to lose weight in the past. She has recently re-established with weight loss clinic and is motivated to lose weight again I think this is the cause of her shortness of breath I have encouraged weight loss and routine physical activity  3.  HTN Stable No change required today  4. SOB Deconditioning is the likely cause Not due to AFib Obtain echo and myoview  5. Snoring Refer to Dr 01/21/20  Follow-up with Earl Gala in 6 months  Luster Landsberg MD, [facility] 02/03/2020 11:33 AM

## 2020-02-03 NOTE — Patient Instructions (Addendum)
Medication Instructions:  Your physician recommends that you continue on your current medications as directed. Please refer to the Current Medication list given to you today.  Labwork: None ordered.  Testing/Procedures: Your physician has requested that you have an echocardiogram. Echocardiography is a painless test that uses sound waves to create images of your heart. It provides your doctor with information about the size and shape of your heart and how well your heart's chambers and valves are working. This procedure takes approximately one hour. There are no restrictions for this procedure.  Please schedule ECHO  Your physician has requested that you have a lexiscan myoview. For further information please visit [email or web address] Please follow instruction sheet, as given.  Please schedule for lexiscan  Follow-Up: Your physician wants you to follow-up in: 6 months with Francis Dowse PA.   You will receive a reminder letter in the mail two months in advance. If you don't receive a letter, please call our office to schedule the follow-up appointment.   Any Other Special Instructions Will Be Listed Below (If Applicable).  I have put in a referral to see Dr. Earl Gala for sleep apnea evaluation.  If you need a refill on your cardiac medications before your next appointment, please call your pharmacy.

## 2020-02-06 ENCOUNTER — Encounter (INDEPENDENT_AMBULATORY_CARE_PROVIDER_SITE_OTHER): Payer: Self-pay | Admitting: Family Medicine

## 2020-02-06 ENCOUNTER — Ambulatory Visit (INDEPENDENT_AMBULATORY_CARE_PROVIDER_SITE_OTHER): Payer: Medicare Other | Admitting: Family Medicine

## 2020-02-06 ENCOUNTER — Other Ambulatory Visit: Payer: Self-pay

## 2020-02-06 VITALS — BP 134/85 | HR 69 | Temp 97.9°F | Ht 62.0 in | Wt 338.0 lb

## 2020-02-06 DIAGNOSIS — F3289 Other specified depressive episodes: Secondary | ICD-10-CM | POA: Diagnosis not present

## 2020-02-06 DIAGNOSIS — Z6841 Body Mass Index (BMI) 40.0 and over, adult: Secondary | ICD-10-CM | POA: Diagnosis not present

## 2020-02-06 NOTE — Progress Notes (Signed)
Chief Complaint:   OBESITY [name] is here to discuss her progress with her obesity treatment plan along with follow-up of her obesity related diagnoses. [name] is on keeping a food journal and adhering to recommended goals of 1500 calories and 90 grams of protein and states she is following her eating plan approximately 75% of the time. [name] states she is exercising for 0 minutes 0 times per week.  Today's visit was #: 20 Starting weight: 296 lbs Starting date: 02/08/2018 Today's weight: 338 lbs Today's date: 02/06/2020 Total lbs lost to date: 0 Total lbs lost since last in-office visit: 1 lb  Interim History: [name] is journaling 5 out of 7 days per week.  She reports feeling more motivated since seeing Dr. Rayann Heman from Cardiology.  He made exercise recommendations for her.  Her husband does not tend to shop for groceries like she needs and she is unable to go to grocery store herself. .  She still tends to skip meals at times.  Subjective:   1. Other depression, with emotional eating Mercer notes that bupropion is helping with stress eating.  Assessment/Plan:   1. Other depression, with emotional eating Behavior modification techniques were discussed today to help [name] deal with her emotional/non-hunger eating behaviors.  Orders and follow up as documented in patient record. Continue bupropion.  2. Class 3 severe obesity with serious comorbidity and body mass index (BMI) of 60.0 to 69.9 in adult, unspecified obesity type [facility]) [name] is currently in the action stage of change. As such, her goal is to continue with weight loss efforts. She has agreed to keeping a food journal and adhering to recommended goals of 1300-1600 calories and 90 grams of protein daily.   Encouraged her to order groceries rather than husband shopping for them.  Exercise goals: Continue PT.  Increase cardio to improve stamina.  Behavioral modification strategies: increasing lean protein intake and no  skipping meals.  [name] has agreed to follow-up with our clinic in 2 weeks. She was informed of the importance of frequent follow-up visits to maximize her success with intensive lifestyle modifications for her multiple health conditions.   Objective:   Blood pressure 134/85, pulse 69, temperature 97.9 F (36.6 C), temperature source Oral, height 5\' 2"  (1.575 m), weight (!) 338 lb (153.3 kg), SpO2 99 %. Body mass index is 61.82 kg/m.  General: Cooperative, alert, well developed, in no acute distress. HEENT: Conjunctivae and lids unremarkable. Cardiovascular: Regular rhythm.  Lungs: Increased shortness of breath with exertion. Neurologic: No focal deficits.  Uses a cane with ambulation.  Lab Results  Component Value Date   CREATININE 0.83 11/17/2019   BUN 25 11/17/2019   NA 144 11/17/2019   K 4.6 11/17/2019   CL 104 11/17/2019   CO2 21 11/17/2019   Lab Results  Component Value Date   ALT 11 11/17/2019   AST 18 11/17/2019   ALKPHOS 56 11/17/2019   BILITOT 0.3 11/17/2019   Lab Results  Component Value Date   HGBA1C 5.6 11/17/2019   HGBA1C 5.8 (H) 08/11/2019   HGBA1C 5.5 06/30/2018   HGBA1C 5.7 (H) 02/08/2018   Lab Results  Component Value Date   INSULIN 9.2 11/17/2019   INSULIN 14.4 08/11/2019   INSULIN 4.8 06/30/2018   INSULIN 5.8 02/08/2018   Lab Results  Component Value Date   TSH 3.710 08/11/2019   Lab Results  Component Value Date   CHOL 166 02/08/2018   HDL 75 02/08/2018   LDLCALC 76 02/08/2018  TRIG 75 02/08/2018   CHOLHDL 2.2 02/08/2018   Lab Results  Component Value Date   WBC 11.2 (H) 08/24/2018   HGB 12.9 08/24/2018   HCT 40.3 08/24/2018   MCV 84.7 08/24/2018   PLT 471 (H) 08/24/2018   Obesity Behavioral Intervention Documentation for Insurance:   Approximately 15 minutes were spent on the discussion below.  ASK: We discussed the diagnosis of obesity with Rinaldo Cloud today and Ndeye agreed to give [place] permission to discuss obesity behavioral  modification therapy today.  ASSESS: Nilani has the diagnosis of obesity and her BMI today is 61.8. [name] is in the action stage of change.   ADVISE: Nalla was educated on the multiple health risks of obesity as well as the benefit of weight loss to improve her health. She was advised of the need for long term treatment and the importance of lifestyle modifications to improve her current health and to decrease her risk of future health problems.  AGREE: Multiple dietary modification options and treatment options were discussed and Sharma agreed to follow the recommendations documented in the above note.  ARRANGE: Cleva was educated on the importance of frequent visits to treat obesity as outlined per CMS and USPSTF guidelines and agreed to schedule her next follow up appointment today.  Attestation Statements:   Reviewed by clinician on day of visit: allergies, medications, problem list, medical history, surgical history, family history, social history, and previous encounter notes.  I, Insurance claims handler, CMA, am acting as Energy manager for [name], FNP-C.  I have reviewed the above documentation for accuracy and completeness, and I agree with the above. -  Jesse Sans, FNP

## 2020-02-07 ENCOUNTER — Ambulatory Visit: Payer: Medicare Other | Admitting: Physical Therapy

## 2020-02-09 ENCOUNTER — Ambulatory Visit: Payer: Medicare Other | Admitting: Internal Medicine

## 2020-02-09 ENCOUNTER — Encounter: Payer: Self-pay | Admitting: Internal Medicine

## 2020-02-09 ENCOUNTER — Ambulatory Visit: Payer: Medicare Other | Admitting: Physical Therapy

## 2020-02-09 ENCOUNTER — Ambulatory Visit (INDEPENDENT_AMBULATORY_CARE_PROVIDER_SITE_OTHER)
Admission: RE | Admit: 2020-02-09 | Discharge: 2020-02-09 | Disposition: A | Discharge status: Home / Self Care | Payer: Medicare Other | Source: Ambulatory Visit | Attending: Internal Medicine | Admitting: Internal Medicine

## 2020-02-09 ENCOUNTER — Other Ambulatory Visit: Payer: Medicare Other

## 2020-02-09 ENCOUNTER — Other Ambulatory Visit: Payer: Self-pay

## 2020-02-09 DIAGNOSIS — R0600 Dyspnea, unspecified: Secondary | ICD-10-CM

## 2020-02-09 DIAGNOSIS — Z6841 Body Mass Index (BMI) 40.0 and over, adult: Secondary | ICD-10-CM

## 2020-02-09 DIAGNOSIS — R0609 Other forms of dyspnea: Secondary | ICD-10-CM

## 2020-02-09 NOTE — Patient Instructions (Addendum)
To get the most out of exercise, you need to be continuously aware that you are short of breath, but never out of breath, for 30 minutes daily. As you improve, it will actually be easier for you to do the same amount of exercise  in  30 minutes so always push to the level where you are short of breath.      Please remember to go to the  x-ray department  for your tests - we will call you with the results when they are available     Please schedule a follow up office visit in 6 weeks, call sooner if needed with pfts on return same day

## 2020-02-09 NOTE — Progress Notes (Signed)
[name], female    DOB: 1956/06/20,   MRN: [id number]   Brief patient profile:  89 yowf quit smoking 1989 (discovered the lord/ not due to resp sequelae)  always on heavy side but played volley ball in high school 2 IUPs with baseline post partum recovery wt around 225 at age 64 and then June 2019 foot surgery complicated by septic arthritis with prolonged abx per [facility] line very inactive wt 260 and eventually May 2020 reconstruction at [facility] and never recovered activity tolerance once surgery done referred to pulmonary clinic 02/09/2020 by Dr  Cliffton Asters.     History of Present Illness  02/09/2020  Pulmonary/ 1st office eval/[name]  Chief Complaint  Patient presents with  . Pulmonary Consult    Referred by Dr Laurann Montana. Pt c/o DOE since Jan 2021. She gets winded with walking short distances and with exertion such as making her bed.   Dyspnea:  MMRC3 = can't walk 100 yards even at a slow pace at a flat grade s stopping due to sob   Cough: none Sleep: flat bed / 2pillows but snores > being referred to Dr Earl Gala  SABA use: no better  she had previously lost down to 269lb prior to surgery with significant improvement in energy level and shortness of breath.   No obvious day to day or daytime variability or assoc excess/ purulent sputum or mucus plugs or hemoptysis or cp or chest tightness, subjective wheeze or overt sinus or hb symptoms.   Sleeping  without nocturnal  or early am exacerbation  of respiratory  c/o's or need for noct saba. Also denies any obvious fluctuation of symptoms with weather or environmental changes or other aggravating or alleviating factors except as outlined above   No unusual exposure hx or h/o childhood pna/ asthma or knowledge of premature birth.  Current Allergies, Complete Past Medical History, Past Surgical History, Family History, and Social History were reviewed in [employer] record.  ROS  The following are not active  complaints unless bolded Hoarseness, sore throat, dysphagia, dental problems, itching, sneezing,  nasal congestion or discharge of excess mucus or purulent secretions, ear ache,   fever, chills, sweats, unintended wt loss or wt gain, classically pleuritic or exertional cp,  orthopnea pnd or arm/hand swelling  or leg swelling, presyncope, palpitations, abdominal pain, anorexia, nausea, vomiting, diarrhea  or change in bowel habits or change in bladder habits, change in stools or change in urine, dysuria, hematuria,  rash, arthralgias, visual complaints, headache, numbness, weakness or ataxia or problems with walking or coordination,  change in mood or  memory.           Past Medical History:  Diagnosis Date  . Atrial fibrillation (HCC)   . Chronic lower back pain   . Depression   . DVT (deep venous thrombosis) (HCC) 1990s   LLE  . Fallen arches   . Hypertension   . Hyperthyroidism ~ 2000   "fine now" (04/23/2016)  . Joint pain   . Lactose intolerance   . Leg edema   . Obesity   . Pneumonia 1980s X 1  . Snoring    has not had sleep study but suspects sleep apnea    Outpatient Medications Prior to Visit  Medication Sig Dispense Refill  . acetaminophen (TYLENOL) 325 MG tablet Take 2 tablets by mouth as needed for pain.    Marland Kitchen albuterol (VENTOLIN HFA) 108 (90 Base) MCG/ACT inhaler Inhale 1 puff into the lungs as  needed for shortness of breath.    . Ascorbic Acid (VITAMIN C) 1000 MG tablet Take 1,000 mg by mouth daily.    Marland Kitchen buPROPion (WELLBUTRIN SR) 150 MG 12 hr tablet Take 1 tablet (150 mg total) by mouth daily. 30 tablet 0  . DULoxetine (CYMBALTA) 30 MG capsule Take 90 mg by mouth daily.    . DULoxetine (CYMBALTA) 60 MG capsule Take 60 mg by mouth daily.    Marland Kitchen gabapentin (NEURONTIN) 400 MG capsule Take 400 mg by mouth 3 (three) times daily.    Marland Kitchen losartan-hydrochlorothiazide (HYZAAR) 100-25 MG tablet Take 1 tablet by mouth daily.    . metoprolol tartrate (LOPRESSOR) 25 MG tablet Take 0.5  tablets (12.5 mg total) by mouth 2 (two) times daily. Appointment Required For Further Refills [phone] 30 tablet 0  . triamterene-hydrochlorothiazide (MAXZIDE-25) 37.5-25 MG tablet Take 0.5 tablets by mouth every morning.    . Vitamin D, Ergocalciferol, (DRISDOL) 1.25 MG (50000 UNIT) CAPS capsule Take 1 capsule (50,000 Units total) by mouth every 7 (seven) days. 4 capsule 0  . XARELTO 20 MG TABS tablet Take 20 mg by mouth daily with supper.        Objective:     BP 122/84 (BP Location: Left Arm, Cuff Size: Large)   Pulse 67   Temp 98.1 F (36.7 C) (Temporal)   Ht 5\' 2"  (1.575 m)   Wt (!) 340 lb (154.2 kg)   SpO2 95% Comment: on RA  BMI 62.19 kg/m   SpO2: 95 %(on RA)   Somber amb obese wf nad   HEENT : pt wearing mask not removed for exam due to covid -19 concerns.    NECK :  without JVD/Nodes/TM/ nl carotid upstrokes bilaterally   LUNGS: no acc muscle use,  Nl contour chest which is clear to A and P bilaterally without cough on insp or exp maneuvers   CV: IRIR  no s3 or murmur or increase in P2, and trace bilateral LE  edema   ABD:  soft and nontender with nl inspiratory excursion in the supine position. No bruits or organomegaly appreciated, bowel sounds nl  MS:  Nl gait/ ext warm without deformities, calf tenderness, cyanosis or clubbing No obvious joint restrictions   SKIN: warm and dry without lesions    NEURO:  alert, approp, nl sensorium with  no motor or cerebellar deficits apparent.    CXR PA and Lateral:   02/09/2020 :    I personally reviewed images and   impression as follows:   Small lung volumes, probable PA enlargement   Labs   reviewed:      Chemistry      Component Value Date/Time   NA 144 11/17/2019 1514   K 4.6 11/17/2019 1514   CL 104 11/17/2019 1514   CO2 21 11/17/2019 1514   BUN 25 11/17/2019 1514   CREATININE 0.83 11/17/2019 1514   CREATININE 0.66 06/13/2016 1432      Component Value Date/Time   CALCIUM 9.6 11/17/2019 1514    ALKPHOS 56 11/17/2019 1514   AST 18 11/17/2019 1514   ALT 11 11/17/2019 1514   BILITOT 0.3 11/17/2019 1514        Lab Results  Component Value Date   WBC 11.2 (H) 08/24/2018   HGB 12.9 08/24/2018   HCT 40.3 08/24/2018   MCV 84.7 08/24/2018   PLT 471 (H) 08/24/2018        Lab Results  Component Value Date   TSH 3.710 08/11/2019  Assessment   DOE (dyspnea on exertion) ? Onset around Jan 2021  - 02/09/2020   Walked RA x one lap =  approx 250 ft -@ slow pace stopped due to sob with sats still around 95%      Symptoms are markedly disproportionate to objective findings and not clear to what extent this is actually a pulmonary  problem but pt does appear to have difficult to sort out respiratory symptoms of unknown origin for which  DDX  = almost all start with A and  include Adherence, Ace Inhibitors, Acid Reflux, Active Sinus Disease, Alpha 1 Antitripsin deficiency, Anxiety masquerading as Airways dz,  ABPA,  Allergy(esp in young), Aspiration (esp in elderly), Adverse effects of meds,  Active smoking or Vaping, A bunch of PE's/clot burden (a few small clots can't cause this syndrome unless there is already severe underlying pulm or vascular dz with poor reserve),  Anemia or thyroid disorder, plus two Bs  = Bronchiectasis and Beta blocker use..and one C= CHF     Adherence is always the initial "prime suspect" and is a multilayered concern that requires a "trust but verify" approach in every patient - starting with knowing how to use medications, especially inhalers, correctly, keeping up with refills and understanding the fundamental difference between maintenance and prns vs those medications only taken for a very short course and then stopped and not refilled.   ? Anemia/ thyroid dz >   Dr Cliffton Asters but tsh ok in oct 2020 but needs cbc to complete if not done since onse of doe..  ? Allergy /asthma > unlikely s cough or variability or assoc rhinitis    ? Adverse side  effects of meds > none of the usual suspects listed  ? A bunch of PE's > already on DOAC though she could have TEPAH and probably needs v/q to exclude if we do see PH on her planned echo   ? BB effects > unlikely on such low doses of lopressor  ? CHF >  Has CAF well controlled with echo and sleep eval already planned.    >>>  Will have her return with full pfts to sort out any pulmonary component and continue DOAC of course with continued efforts at maintaining neg cal balance and improving conditioning.       Class 3 severe obesity with serious comorbidity and body mass index (BMI) of 60.0 to 69.9 in adult [facility]) Body mass index is 62.19 kg/m.  -    Lab Results  Component Value Date   TSH 3.710 08/11/2019     Contributing to gerd risk/ doe/reviewed the need and the process to achieve and maintain neg calorie balance > defer f/u primary care including intermittently monitoring thyroid status            Each maintenance medication was reviewed in detail including emphasizing most importantly the difference between maintenance and prns and under what circumstances the prns are to be triggered using an action plan format where appropriate.  Total time for H and P, chart review, counseling,   directly observing portions of ambulatory 02 saturation study/  and generating customized AVS unique to this office visit / charting = 45 min           Sandrea Hughs, MD 02/09/2020

## 2020-02-10 ENCOUNTER — Encounter: Payer: Self-pay | Admitting: Internal Medicine

## 2020-02-10 NOTE — Assessment & Plan Note (Signed)
Body mass index is 62.19 kg/m.  -    Lab Results  Component Value Date   TSH 3.710 08/11/2019     Contributing to gerd risk/ doe/reviewed the need and the process to achieve and maintain neg calorie balance > defer f/u primary care including intermittently monitoring thyroid status            Each maintenance medication was reviewed in detail including emphasizing most importantly the difference between maintenance and prns and under what circumstances the prns are to be triggered using an action plan format where appropriate.  Total time for H and P, chart review, counseling,   directly observing portions of ambulatory 02 saturation study/  and generating customized AVS unique to this office visit / charting = 45 min

## 2020-02-10 NOTE — Assessment & Plan Note (Addendum)
?   Onset around Jan 2021  - 02/09/2020   Walked RA x one lap =  approx 250 ft -@ slow pace stopped due to sob with sats still around 95%      Symptoms are markedly disproportionate to objective findings and not clear to what extent this is actually a pulmonary  problem but pt does appear to have difficult to sort out respiratory symptoms of unknown origin for which  DDX  = almost all start with A and  include Adherence, Ace Inhibitors, Acid Reflux, Active Sinus Disease, Alpha 1 Antitripsin deficiency, Anxiety masquerading as Airways dz,  ABPA,  Allergy(esp in young), Aspiration (esp in elderly), Adverse effects of meds,  Active smoking or Vaping, A bunch of PE's/clot burden (a few small clots can't cause this syndrome unless there is already severe underlying pulm or vascular dz with poor reserve),  Anemia or thyroid disorder, plus two Bs  = Bronchiectasis and Beta blocker use..and one C= CHF     Adherence is always the initial "prime suspect" and is a multilayered concern that requires a "trust but verify" approach in every patient - starting with knowing how to use medications, especially inhalers, correctly, keeping up with refills and understanding the fundamental difference between maintenance and prns vs those medications only taken for a very short course and then stopped and not refilled.   ? Anemia/ thyroid dz >   Dr Cliffton Asters but tsh ok in oct 2020 but needs cbc to complete if not done since onse of doe..  ? Allergy /asthma > unlikely s cough or variability or assoc rhinitis    ? Adverse side effects of meds > none of the usual suspects listed  ? A bunch of PE's > already on DOAC though she could have TEPAH and probably needs v/q to exclude if we do see PH on her planned echo   ? BB effects > unlikely on such low doses of lopressor  ? CHF >  Has CAF well controlled with echo and sleep eval already planned.    >>>  Will have her return with full pfts to sort out any pulmonary component and  continue DOAC of course with continued efforts at maintaining neg cal balance and improving conditioning.

## 2020-02-10 NOTE — Progress Notes (Signed)
Spoke with pt and notified of results per Dr. Wert. Pt verbalized understanding and denied any questions. 

## 2020-02-15 ENCOUNTER — Ambulatory Visit: Payer: Medicare Other | Attending: Internal Medicine

## 2020-02-15 DIAGNOSIS — Z23 Encounter for immunization: Secondary | ICD-10-CM

## 2020-02-15 NOTE — Progress Notes (Signed)
   Covid-19 Vaccination Clinic  Name:  LAAIBAH WARTMAN    MRN: [id number] DOB: 06-Feb-1956  02/15/2020  Ms. Tony was observed post Covid-19 immunization for 15 minutes without incident. She was provided with Vaccine Information Sheet and instruction to access the V-Safe system.   Ms. Heffler was instructed to call 911 with any severe reactions post vaccine: Marland Kitchen Difficulty breathing  . Swelling of face and throat  . A fast heartbeat  . A bad rash all over body  . Dizziness and weakness   Immunizations Administered    Name Date Dose VIS Date Route   Pfizer COVID-19 Vaccine 02/15/2020  4:18 PM 0.3 mL 12/21/2018 Intramuscular   Manufacturer: [employer], [employer]   Lot: [id number]   NDC: [address]-6

## 2020-02-20 ENCOUNTER — Telehealth (HOSPITAL_COMMUNITY): Payer: Self-pay | Admitting: *Deleted

## 2020-02-20 ENCOUNTER — Other Ambulatory Visit: Payer: Self-pay

## 2020-02-20 ENCOUNTER — Ambulatory Visit (INDEPENDENT_AMBULATORY_CARE_PROVIDER_SITE_OTHER): Payer: Medicare Other | Admitting: Family Medicine

## 2020-02-20 ENCOUNTER — Encounter (INDEPENDENT_AMBULATORY_CARE_PROVIDER_SITE_OTHER): Payer: Self-pay | Admitting: Family Medicine

## 2020-02-20 VITALS — BP 124/81 | HR 75 | Temp 98.0°F | Ht 62.0 in | Wt 334.0 lb

## 2020-02-20 DIAGNOSIS — F3289 Other specified depressive episodes: Secondary | ICD-10-CM

## 2020-02-20 DIAGNOSIS — Z6841 Body Mass Index (BMI) 40.0 and over, adult: Secondary | ICD-10-CM | POA: Diagnosis not present

## 2020-02-20 DIAGNOSIS — E559 Vitamin D deficiency, unspecified: Secondary | ICD-10-CM | POA: Diagnosis not present

## 2020-02-20 MED ORDER — VITAMIN D (ERGOCALCIFEROL) 1.25 MG (50000 UNIT) PO CAPS: 50000.0000 [IU] | ORAL_CAPSULE | ORAL | 0 refills | Status: DC

## 2020-02-20 MED ORDER — BUPROPION HCL ER (SR) 150 MG PO TB12: 150.0000 mg | ORAL_TABLET | Freq: Every day | ORAL | 0 refills | Status: DC

## 2020-02-20 NOTE — Telephone Encounter (Signed)
Patient given detailed instructions per Myocardial Perfusion Study Information Sheet for the test on 02/22/20 Patient notified to arrive 15 minutes early and that it is imperative to arrive on time for appointment to keep from having the test rescheduled.  If you need to cancel or reschedule your appointment, please call the office within 24 hours of your appointment. . Patient verbalized understanding. [name] Jacqueline    

## 2020-02-21 NOTE — Progress Notes (Signed)
Chief Complaint:   OBESITY Ramah is here to discuss her progress with her obesity treatment plan along with follow-up of her obesity related diagnoses. Jaselyn is on keeping a food journal and adhering to recommended goals of 1300-1600 calories and 90 grams of protein daily and states she is following her eating plan approximately 90% of the time. Vani states she is doing 0 minutes 0 times per week.  Today's visit was #: 21 Starting weight: 296 lbs Starting date: 02/08/2018 Today's weight: 334 lbs Today's date: 02/20/2020 Total lbs lost to date: 0 Total lbs lost since last in-office visit: 4  Interim History: Jenifer is journaling much more than she had been. She is meeting her protein goal most days. She notes her quality of food is not good at times. Her husband does not always buy the food she needs for the plan. She tends to skip meals when she doesn't have the right foods.  Subjective:   1. Other depression, with emotional eating Shoua notes her bupropion is reducing cravings. She notes dry mouth.  2. Vitamin D deficiency Veera's last Vit D level was nearly at goal (48.1 on 11/17/2019). She is on high dose Vit D weekly.  Assessment/Plan:   1. Other depression, with emotional eating Behavior modification techniques were discussed today to help Constantina deal with her emotional/non-hunger eating behaviors. We will refill bupropion for 1 month. Orders and follow up as documented in patient record.   - buPROPion (WELLBUTRIN SR) 150 MG 12 hr tablet; Take 1 tablet (150 mg total) by mouth daily.  Dispense: 30 tablet; Refill: 0  2. Vitamin D deficiency Low Vitamin D level contributes to fatigue and are associated with obesity, breast, and colon cancer. We will refill prescription Vitamin D for 1 month. Zona will follow-up for routine testing of Vitamin D, at least 2-3 times per year to avoid over-replacement.  - Vitamin D, Ergocalciferol, (DRISDOL) 1.25 MG (50000 UNIT) CAPS  capsule; Take 1 capsule (50,000 Units total) by mouth every 7 (seven) days.  Dispense: 4 capsule; Refill: 0  3. Class 3 severe obesity with serious comorbidity and body mass index (BMI) of 60.0 to 69.9 in adult, unspecified obesity type [facility]) Gennifer is currently in the action stage of change. As such, her goal is to continue with weight loss efforts. She has agreed to keeping a food journal and adhering to recommended goals of 1300-1600 calories and 90 grams of protein daily.   Alexine will consider getting someone else to pick out her groceries-possibly her grandson.  Exercise goals: All adults should avoid inactivity. Some physical activity is better than none, and adults who participate in any amount of physical activity gain some health benefits.  Behavioral modification strategies: increasing lean protein intake and keeping a strict food journal.  Santanna has agreed to follow-up with our clinic in 2 weeks. She was informed of the importance of frequent follow-up visits to maximize her success with intensive lifestyle modifications for her multiple health conditions.   Objective:   Blood pressure 124/81, pulse 75, temperature 98 F (36.7 C), temperature source Oral, height 5\' 2"  (1.575 m), weight (!) 334 lb (151.5 kg), SpO2 98 %. Body mass index is 61.09 kg/m.  General: Cooperative, alert, well developed, in no acute distress. HEENT: Conjunctivae and lids unremarkable. Cardiovascular: Regular rhythm.  Lungs: Normal work of breathing. Neurologic: No focal deficits.   Lab Results  Component Value Date   CREATININE 0.83 11/17/2019   BUN 25 11/17/2019  NA 144 11/17/2019   K 4.6 11/17/2019   CL 104 11/17/2019   CO2 21 11/17/2019   Lab Results  Component Value Date   ALT 11 11/17/2019   AST 18 11/17/2019   ALKPHOS 56 11/17/2019   BILITOT 0.3 11/17/2019   Lab Results  Component Value Date   HGBA1C 5.6 11/17/2019   HGBA1C 5.8 (H) 08/11/2019   HGBA1C 5.5 06/30/2018   HGBA1C  5.7 (H) 02/08/2018   Lab Results  Component Value Date   INSULIN 9.2 11/17/2019   INSULIN 14.4 08/11/2019   INSULIN 4.8 06/30/2018   INSULIN 5.8 02/08/2018   Lab Results  Component Value Date   TSH 3.710 08/11/2019   Lab Results  Component Value Date   CHOL 166 02/08/2018   HDL 75 02/08/2018   LDLCALC 76 02/08/2018   TRIG 75 02/08/2018   CHOLHDL 2.2 02/08/2018   Lab Results  Component Value Date   WBC 11.2 (H) 08/24/2018   HGB 12.9 08/24/2018   HCT 40.3 08/24/2018   MCV 84.7 08/24/2018   PLT 471 (H) 08/24/2018   No results found for: IRON, TIBC, FERRITIN  Obesity Behavioral Intervention Documentation for Insurance:   Approximately 15 minutes were spent on the discussion below.  ASK: We discussed the diagnosis of obesity with [name] today and Rufina agreed to give [place] permission to discuss obesity behavioral modification therapy today.  ASSESS: Otie has the diagnosis of obesity and her BMI today is 61.07. Chene is in the action stage of change.   ADVISE: Dejanira was educated on the multiple health risks of obesity as well as the benefit of weight loss to improve her health. She was advised of the need for long term treatment and the importance of lifestyle modifications to improve her current health and to decrease her risk of future health problems.  AGREE: Multiple dietary modification options and treatment options were discussed and Tamerra agreed to follow the recommendations documented in the above note.  ARRANGE: Rennie was educated on the importance of frequent visits to treat obesity as outlined per CMS and USPSTF guidelines and agreed to schedule her next follow up appointment today.  Attestation Statements:   Reviewed by clinician on day of visit: allergies, medications, problem list, medical history, surgical history, family history, social history, and previous encounter notes.   Wilhemena Durie, am acting as Location manager for [employer],  FNP-C.  I have reviewed the above documentation for accuracy and completeness, and I agree with the above. -  Georgianne Fick, FNP

## 2020-02-22 ENCOUNTER — Other Ambulatory Visit (HOSPITAL_COMMUNITY): Payer: Self-pay | Admitting: Nurse Practitioner

## 2020-02-22 ENCOUNTER — Other Ambulatory Visit: Payer: Self-pay

## 2020-02-22 ENCOUNTER — Encounter (INDEPENDENT_AMBULATORY_CARE_PROVIDER_SITE_OTHER): Payer: Self-pay | Admitting: Family Medicine

## 2020-02-22 ENCOUNTER — Ambulatory Visit (HOSPITAL_COMMUNITY): Payer: Medicare Other | Attending: Cardiovascular Disease

## 2020-02-22 DIAGNOSIS — R0602 Shortness of breath: Secondary | ICD-10-CM | POA: Diagnosis not present

## 2020-02-22 MED ORDER — REGADENOSON 0.4 MG/5ML IV SOLN
0.4000 mg | Freq: Once | INTRAVENOUS | Status: AC
Start: 2020-02-22 — End: 2020-02-22
  Administered 2020-02-22: 0.4 mg via INTRAVENOUS

## 2020-02-22 MED ORDER — TECHNETIUM TC 99M TETROFOSMIN IV KIT
32.5000 | PACK | Freq: Once | INTRAVENOUS | Status: AC | PRN
  Administered 2020-02-22: 32.5 via INTRAVENOUS
  Filled 2020-02-22: qty 33

## 2020-02-23 ENCOUNTER — Ambulatory Visit (HOSPITAL_COMMUNITY): Payer: Medicare Other | Attending: Internal Medicine

## 2020-02-23 ENCOUNTER — Ambulatory Visit (HOSPITAL_COMMUNITY): Payer: Medicare Other

## 2020-02-23 DIAGNOSIS — R0602 Shortness of breath: Secondary | ICD-10-CM | POA: Diagnosis not present

## 2020-02-23 MED ORDER — TECHNETIUM TC 99M TETROFOSMIN IV KIT
32.2000 | PACK | Freq: Once | INTRAVENOUS | Status: AC | PRN
  Administered 2020-02-23: 32.2 via INTRAVENOUS
  Filled 2020-02-23: qty 33

## 2020-02-24 ENCOUNTER — Telehealth: Payer: Self-pay

## 2020-02-24 LAB — MYOCARDIAL PERFUSION IMAGING
LV dias vol: 125 mL (ref 46–106)
LV sys vol: 64 mL
Peak HR: 94 {beats}/min
Rest HR: 85 {beats}/min
SDS: 0
SRS: 2
SSS: 2
TID: 1.01

## 2020-02-24 NOTE — Telephone Encounter (Signed)
error 

## 2020-02-25 ENCOUNTER — Other Ambulatory Visit (INDEPENDENT_AMBULATORY_CARE_PROVIDER_SITE_OTHER): Payer: Self-pay | Admitting: Family Medicine

## 2020-02-25 DIAGNOSIS — F3289 Other specified depressive episodes: Secondary | ICD-10-CM

## 2020-03-02 ENCOUNTER — Other Ambulatory Visit (HOSPITAL_COMMUNITY)
Admission: RE | Admit: 2020-03-02 | Discharge: 2020-03-02 | Disposition: A | Discharge status: Home / Self Care | Payer: Medicare Other | Source: Ambulatory Visit | Attending: Internal Medicine | Admitting: Internal Medicine

## 2020-03-02 ENCOUNTER — Other Ambulatory Visit: Payer: Self-pay | Admitting: *Deleted

## 2020-03-02 DIAGNOSIS — Z01812 Encounter for preprocedural laboratory examination: Secondary | ICD-10-CM | POA: Diagnosis present

## 2020-03-02 DIAGNOSIS — R0609 Other forms of dyspnea: Secondary | ICD-10-CM

## 2020-03-02 DIAGNOSIS — Z20822 Contact with and (suspected) exposure to covid-19: Secondary | ICD-10-CM | POA: Insufficient documentation

## 2020-03-02 DIAGNOSIS — R0600 Dyspnea, unspecified: Secondary | ICD-10-CM

## 2020-03-02 LAB — SARS CORONAVIRUS 2 (TAT 6-24 HRS): SARS Coronavirus 2: NEGATIVE

## 2020-03-05 ENCOUNTER — Ambulatory Visit (INDEPENDENT_AMBULATORY_CARE_PROVIDER_SITE_OTHER): Payer: Medicare Other | Admitting: Internal Medicine

## 2020-03-05 ENCOUNTER — Other Ambulatory Visit: Payer: Self-pay

## 2020-03-05 DIAGNOSIS — R0600 Dyspnea, unspecified: Secondary | ICD-10-CM | POA: Diagnosis not present

## 2020-03-05 DIAGNOSIS — R0609 Other forms of dyspnea: Secondary | ICD-10-CM

## 2020-03-05 NOTE — Progress Notes (Signed)
Full PFT performed today. °

## 2020-03-06 ENCOUNTER — Other Ambulatory Visit: Payer: Self-pay

## 2020-03-06 ENCOUNTER — Ambulatory Visit (INDEPENDENT_AMBULATORY_CARE_PROVIDER_SITE_OTHER): Payer: Medicare Other | Admitting: Family Medicine

## 2020-03-06 ENCOUNTER — Encounter (INDEPENDENT_AMBULATORY_CARE_PROVIDER_SITE_OTHER): Payer: Self-pay | Admitting: Family Medicine

## 2020-03-06 VITALS — BP 156/90 | HR 72 | Temp 97.6°F | Ht 62.0 in | Wt 336.0 lb

## 2020-03-06 DIAGNOSIS — I10 Essential (primary) hypertension: Secondary | ICD-10-CM

## 2020-03-06 DIAGNOSIS — Z6841 Body Mass Index (BMI) 40.0 and over, adult: Secondary | ICD-10-CM

## 2020-03-06 NOTE — Progress Notes (Signed)
Chief Complaint:   OBESITY [name] is here to discuss her progress with her obesity treatment plan along with follow-up of her obesity related diagnoses. [name] is on keeping a food journal and adhering to recommended goals of 1300-1600 calories and 90 grams of protein daily and states she is following her eating plan approximately 85% of the time. [name] states she is doing 0 minutes 0 times per week.  Today's visit was #: 22 Starting weight: 296 lbs Starting date: 02/08/2018 Today's weight: 336 lbs Today's date: 03/06/2020 Total lbs lost to date: 0 Total lbs lost since last in-office visit: 0  Interim History: [name] is journaling about 5 out of 7 days per week. She is averaging about 70 grams of protein and 1420 calories daily. She feels she eats less than she should on days that she does not journal. She is often skipping breakfast.  Subjective:   1. Essential hypertension [name]'s blood pressure is elevated today but usually well controlled. She denies chest pain or shortness of breath. BP Readings from Last 3 Encounters:  03/06/20 (!) 156/90  02/20/20 124/81  02/09/20 122/84     Assessment/Plan:   1. Essential hypertension [name] is working on healthy weight loss and exercise to improve blood pressure control. We will watch for signs of hypotension as she continues her lifestyle modifications. [name] will continue triamterene-hydrochlorothiazide.  2. Class 3 severe obesity with serious comorbidity and body mass index (BMI) of 60.0 to 69.9 in adult, unspecified obesity type [name] is currently in the action stage of change. As such, her goal is to continue with weight loss efforts. She has agreed to keeping a food journal and adhering to recommended goals of 1300-1600 calories and 90 grams of protein daily.   Handouts given today: Protein Content of Foods, and Protein Equivalents. We discussed breakfast options in detail. We will consider switching her to Category 3  if she continues to skip meals.  Exercise goals: No exercise has been prescribed at this time.  Behavioral modification strategies: increasing lean protein intake, no skipping meals, meal planning and cooking strategies and planning for success.  [name] has agreed to follow-up with our clinic in 2 to 3 weeks. She was informed of the importance of frequent follow-up visits to maximize her success with intensive lifestyle modifications for her multiple health conditions.   Objective:   Blood pressure (!) 156/90, pulse 72, temperature 97.6 F (36.4 C), temperature source Oral, height 5\' 2"  (1.575 m), weight (!) 336 lb (152.4 kg), SpO2 99 %. Body mass index is 61.46 kg/m.  General: Cooperative, alert, well developed, in no acute distress. HEENT: Conjunctivae and lids unremarkable. Cardiovascular: Regular rhythm.  Lungs: SOB with exertion (after walking from vitals room). Neurologic: No focal deficits.   Lab Results  Component Value Date   CREATININE 0.83 11/17/2019   BUN 25 11/17/2019   NA 144 11/17/2019   K 4.6 11/17/2019   CL 104 11/17/2019   CO2 21 11/17/2019   Lab Results  Component Value Date   ALT 11 11/17/2019   AST 18 11/17/2019   ALKPHOS 56 11/17/2019   BILITOT 0.3 11/17/2019   Lab Results  Component Value Date   HGBA1C 5.6 11/17/2019   HGBA1C 5.8 (H) 08/11/2019   HGBA1C 5.5 06/30/2018   HGBA1C 5.7 (H) 02/08/2018   Lab Results  Component Value Date   INSULIN 9.2 11/17/2019   INSULIN 14.4 08/11/2019   INSULIN 4.8 06/30/2018   INSULIN 5.8 02/08/2018   Lab  Results  Component Value Date   TSH 3.710 08/11/2019   Lab Results  Component Value Date   CHOL 166 02/08/2018   HDL 75 02/08/2018   LDLCALC 76 02/08/2018   TRIG 75 02/08/2018   CHOLHDL 2.2 02/08/2018   Lab Results  Component Value Date   WBC 11.2 (H) 08/24/2018   HGB 12.9 08/24/2018   HCT 40.3 08/24/2018   MCV 84.7 08/24/2018   PLT 471 (H) 08/24/2018   No results found for: IRON, TIBC,  FERRITIN  Obesity Behavioral Intervention Documentation for Insurance:   Approximately 15 minutes were spent on the discussion below.  ASK: We discussed the diagnosis of obesity with [name] today and [name] agreed to give [place] permission to discuss obesity behavioral modification therapy today.  ASSESS: [name] has the diagnosis of obesity and her BMI today is 61.44. [name] is in the action stage of change.   ADVISE: [name] was educated on the multiple health risks of obesity as well as the benefit of weight loss to improve her health. She was advised of the need for long term treatment and the importance of lifestyle modifications to improve her current health and to decrease her risk of future health problems.  AGREE: Multiple dietary modification options and treatment options were discussed and [name] agreed to follow the recommendations documented in the above note.  ARRANGE: [name] was educated on the importance of frequent visits to treat obesity as outlined per CMS and USPSTF guidelines and agreed to schedule her next follow up appointment today.  Attestation Statements:   Reviewed by clinician on day of visit: allergies, medications, problem list, medical history, surgical history, family history, social history, and previous encounter notes.   Trude Mcburney, am acting as Energy manager for [name], FNP-C.  I have reviewed the above documentation for accuracy and completeness, and I agree with the above. -  Jesse Sans, FNP

## 2020-03-08 LAB — PULMONARY FUNCTION TEST
DL/VA % pred: 124 %
DL/VA: 5.31 ml/min/mmHg/L
DLCO cor % pred: 108 %
DLCO cor: 20.41 ml/min/mmHg
DLCO unc % pred: 108 %
DLCO unc: 20.41 ml/min/mmHg
FEF 25-75 Post: 2.99 L/sec
FEF 25-75 Pre: 2.89 L/sec
FEF2575-%Change-Post: 3 %
FEF2575-%Pred-Post: 136 %
FEF2575-%Pred-Pre: 132 %
FEV1-%Change-Post: 1 %
FEV1-%Pred-Post: 82 %
FEV1-%Pred-Pre: 80 %
FEV1-Post: 1.93 L
FEV1-Pre: 1.9 L
FEV1FVC-%Change-Post: 1 %
FEV1FVC-%Pred-Pre: 112 %
FEV6-%Change-Post: 0 %
FEV6-%Pred-Post: 73 %
FEV6-%Pred-Pre: 73 %
FEV6-Post: 2.15 L
FEV6-Pre: 2.16 L
FEV6FVC-%Pred-Post: 103 %
FEV6FVC-%Pred-Pre: 103 %
FVC-%Change-Post: 0 %
FVC-%Pred-Post: 70 %
FVC-%Pred-Pre: 71 %
FVC-Post: 2.15 L
FVC-Pre: 2.16 L
Post FEV1/FVC ratio: 90 %
Post FEV6/FVC ratio: 100 %
Pre FEV1/FVC ratio: 88 %
Pre FEV6/FVC Ratio: 100 %
RV % pred: 101 %
RV: 1.93 L
TLC % pred: 93 %
TLC: 4.47 L

## 2020-03-14 ENCOUNTER — Other Ambulatory Visit (HOSPITAL_COMMUNITY): Payer: Self-pay | Admitting: Internal Medicine

## 2020-03-19 ENCOUNTER — Ambulatory Visit (INDEPENDENT_AMBULATORY_CARE_PROVIDER_SITE_OTHER): Payer: Medicare Other | Admitting: Family Medicine

## 2020-03-19 ENCOUNTER — Encounter (INDEPENDENT_AMBULATORY_CARE_PROVIDER_SITE_OTHER): Payer: Self-pay | Admitting: Family Medicine

## 2020-03-19 ENCOUNTER — Other Ambulatory Visit: Payer: Self-pay

## 2020-03-19 VITALS — BP 126/82 | HR 71 | Temp 98.6°F | Ht 62.0 in | Wt 335.0 lb

## 2020-03-19 DIAGNOSIS — Z6841 Body Mass Index (BMI) 40.0 and over, adult: Secondary | ICD-10-CM

## 2020-03-19 DIAGNOSIS — F3289 Other specified depressive episodes: Secondary | ICD-10-CM | POA: Diagnosis not present

## 2020-03-19 DIAGNOSIS — E559 Vitamin D deficiency, unspecified: Secondary | ICD-10-CM | POA: Diagnosis not present

## 2020-03-19 MED ORDER — VITAMIN D (ERGOCALCIFEROL) 1.25 MG (50000 UNIT) PO CAPS: 50000.0000 [IU] | ORAL_CAPSULE | ORAL | 0 refills | Status: DC

## 2020-03-19 MED ORDER — BUPROPION HCL ER (SR) 150 MG PO TB12: 150.0000 mg | ORAL_TABLET | Freq: Every day | ORAL | 0 refills | Status: DC

## 2020-03-20 ENCOUNTER — Encounter (INDEPENDENT_AMBULATORY_CARE_PROVIDER_SITE_OTHER): Payer: Self-pay | Admitting: Family Medicine

## 2020-03-20 NOTE — Progress Notes (Signed)
Chief Complaint:   OBESITY [name] is here to discuss her progress with her obesity treatment plan along with follow-up of her obesity related diagnoses. [name] is on keeping a food journal and adhering to recommended goals of 1300-1600 calories and 90 grams of protein daily and states she is following her eating plan approximately 85% of the time. [name] states she is doing 0 minutes 0 times per week.  Today's visit was #: 23 Starting weight: 296 lbs Starting date: 02/08/2018 Today's weight: 335 lbs Today's date: 03/19/2020 Total lbs lost to date: 0 Total lbs lost since last in-office visit: 1  Interim History: [name] is doing well on the plan. [name] is meal prepping more. She is journaling every day. She has met her protein goals 85% of the time. She is able to stick to the plan better when she is alone. She tends to skip meals on the weekend, which causes her to not meet her protein goal. However, she is overall skipping less meals. She is increasing her physical activity throughout the day.  Subjective:   1. Vitamin D deficiency [name]'s last Vit D level was nearly at goal at 48.1. She is on prescription Vit D.  2. Other depression, with emotional eating [name] notes her emotional eating is well controlled with bupropion.  Assessment/Plan:   1. Vitamin D deficiency Low Vitamin D level contributes to fatigue and are associated with obesity, breast, and colon cancer. We will refill prescription Vitamin D for 1 month. [name] will follow-up for routine testing of Vitamin D, at least 2-3 times per year to avoid over-replacement.  - Vitamin D, Ergocalciferol, (DRISDOL) 1.25 MG (50000 UNIT) CAPS capsule; Take 1 capsule (50,000 Units total) by mouth every 7 (seven) days.  Dispense: 4 capsule; Refill: 0  2. Other depression, with emotional eating Behavior modification techniques were discussed today to help [name] deal with her emotional/non-hunger eating behaviors. We will refill  bupropion for 1 month. Orders and follow up as documented in patient record.   - buPROPion (WELLBUTRIN SR) 150 MG 12 hr tablet; Take 1 tablet (150 mg total) by mouth daily.  Dispense: 30 tablet; Refill: 0  3. Class 3 severe obesity with serious comorbidity and body mass index (BMI) of 60.0 to 69.9 in adult, unspecified obesity type [facility]) [name] is currently in the action stage of change. As such, her goal is to continue with weight loss efforts. She has agreed to keeping a food journal and adhering to recommended goals of 1300-1600 calories and 90 grams of protein daily.   Exercise goals: All adults should avoid inactivity. Some physical activity is better than none, and adults who participate in any amount of physical activity gain some health benefits.  We discussed walking a small amount every hour during the day.  Behavioral modification strategies: no skipping meals, better snacking choices and keeping a strict food journal.  [name] has agreed to follow-up with our clinic in 2 weeks. She was informed of the importance of frequent follow-up visits to maximize her success with intensive lifestyle modifications for her multiple health conditions.   Objective:   Blood pressure 126/82, pulse 71, temperature 98.6 F (37 C), temperature source Oral, height 5' 2"  (1.575 m), weight (!) 335 lb (152 kg), SpO2 98 %. Body mass index is 61.27 kg/m.  General: Cooperative, alert, well developed, in no acute distress. HEENT: Conjunctivae and lids unremarkable. Cardiovascular: Regular rhythm.  Lungs: Normal work of breathing. Neurologic: No focal deficits.   Lab Results  Component Value Date   CREATININE 0.83 11/17/2019   BUN 25 11/17/2019   NA 144 11/17/2019   K 4.6 11/17/2019   CL 104 11/17/2019   CO2 21 11/17/2019   Lab Results  Component Value Date   ALT 11 11/17/2019   AST 18 11/17/2019   ALKPHOS 56 11/17/2019   BILITOT 0.3 11/17/2019   Lab Results  Component Value Date   HGBA1C  5.6 11/17/2019   HGBA1C 5.8 (H) 08/11/2019   HGBA1C 5.5 06/30/2018   HGBA1C 5.7 (H) 02/08/2018   Lab Results  Component Value Date   INSULIN 9.2 11/17/2019   INSULIN 14.4 08/11/2019   INSULIN 4.8 06/30/2018   INSULIN 5.8 02/08/2018   Lab Results  Component Value Date   TSH 3.710 08/11/2019   Lab Results  Component Value Date   CHOL 166 02/08/2018   HDL 75 02/08/2018   LDLCALC 76 02/08/2018   TRIG 75 02/08/2018   CHOLHDL 2.2 02/08/2018   Lab Results  Component Value Date   WBC 11.2 (H) 08/24/2018   HGB 12.9 08/24/2018   HCT 40.3 08/24/2018   MCV 84.7 08/24/2018   PLT 471 (H) 08/24/2018   No results found for: IRON, TIBC, FERRITIN  Obesity Behavioral Intervention Documentation for Insurance:   Approximately 15 minutes were spent on the discussion below.  ASK: We discussed the diagnosis of obesity with [name] today and [name] agreed to give [place] permission to discuss obesity behavioral modification therapy today.  ASSESS: [name] has the diagnosis of obesity and her BMI today is 61.26. [name] is in the action stage of change.   ADVISE: [name] was educated on the multiple health risks of obesity as well as the benefit of weight loss to improve her health. She was advised of the need for long term treatment and the importance of lifestyle modifications to improve her current health and to decrease her risk of future health problems.  AGREE: Multiple dietary modification options and treatment options were discussed and Shatina agreed to follow the recommendations documented in the above note.  ARRANGE: Dayanis was educated on the importance of frequent visits to treat obesity as outlined per CMS and USPSTF guidelines and agreed to schedule her next follow up appointment today.  Attestation Statements:   Reviewed by clinician on day of visit: allergies, medications, problem list, medical history, surgical history, family history, social history, and previous encounter  notes.   [name], am acting as Location manager for [employer], FNP-C.  I have reviewed the above documentation for accuracy and completeness, and I agree with the above. -  Abigail Fick, FNP

## 2020-03-22 ENCOUNTER — Ambulatory Visit (INDEPENDENT_AMBULATORY_CARE_PROVIDER_SITE_OTHER): Payer: Medicare Other | Admitting: Internal Medicine

## 2020-03-22 ENCOUNTER — Encounter: Payer: Self-pay | Admitting: Internal Medicine

## 2020-03-22 ENCOUNTER — Other Ambulatory Visit: Payer: Self-pay

## 2020-03-22 DIAGNOSIS — Z6841 Body Mass Index (BMI) 40.0 and over, adult: Secondary | ICD-10-CM | POA: Diagnosis not present

## 2020-03-22 DIAGNOSIS — R0600 Dyspnea, unspecified: Secondary | ICD-10-CM | POA: Diagnosis not present

## 2020-03-22 DIAGNOSIS — R0609 Other forms of dyspnea: Secondary | ICD-10-CM

## 2020-03-22 NOTE — Patient Instructions (Addendum)
Work on weight loss and follow up as planned with Dr Cliffton Asters and your cardiologist (the pressure on your Left side of your heart, which your lungs can feel, appears to be quite high by your last echo but can be treated effectively by medications).

## 2020-03-22 NOTE — Progress Notes (Signed)
[name], female    DOB: 1956-03-27,   MRN: [id number]   Brief patient profile:  22 yowf quit smoking 1989 (discovered the lord/ not due to resp sequelae)  always on heavy side but played volley ball in high school 2 IUPs with baseline post partum recovery wt around 225 at age 64 and then June 2019 foot surgery complicated by septic arthritis with prolonged abx per [facility] line very inactive wt 260 and eventually May 2020 reconstruction at [facility] and never recovered activity tolerance once surgery done referred to pulmonary clinic 02/09/2020 by Dr  Cliffton Asters.     History of Present Illness  02/09/2020  Pulmonary/ 1st office eval/[facility]  Chief Complaint  Patient presents with  . Pulmonary Consult    Referred by Dr Laurann Montana. Pt c/o DOE since Jan 2021. She gets winded with walking short distances and with exertion such as making her bed.   Dyspnea:  MMRC3 = can't walk 100 yards even at a slow pace at a flat grade s stopping due to sob   Cough: none Sleep: flat bed / 2pillows but snores > being referred to Dr Earl Gala  SABA use: no better rec Regular activity    Virtual Visit via Telephone Note 03/22/2020   I connected with Abigail Towns on 03/22/20 at 8:15 AM EDT by telephone and verified that I am speaking with the correct person using two identifiers.   I discussed the limitations, risks, security and privacy concerns of performing an evaluation and management service by telephone and the availability of in person appointments. I also discussed with the patient that there may be a patient responsible charge related to this service. The patient expressed understanding and agreed to proceed.   History of Present Illness: Dyspnea:  50 ft then has stop Cough: none Sleeping: flat in bed/ 1 pillows > feels resting SABA use: none 02: none   No obvious day to day or daytime variability or assoc excess/ purulent sputum or mucus plugs or hemoptysis or cp or chest tightness, subjective  wheeze or overt sinus or hb symptoms.    Also denies any obvious fluctuation of symptoms with weather or environmental changes or other aggravating or alleviating factors except as outlined above.   Meds reviewed/ med reconciliation completed     No outpatient medications have been marked as taking for the 03/22/20 encounter (Office Visit) with Nyoka Cowden, MD.         Observations/Objective: Good voice texture, no conversational sob/ no spont cough    Assessment and Plan: See problem list for active a/p's   Follow Up Instructions: See avs for instructions unique to this ov which includes revised/ updated med list     I discussed the assessment and treatment plan with the patient. The patient was provided an opportunity to ask questions and all were answered. The patient agreed with the plan and demonstrated an understanding of the instructions.   The patient was advised to call back or seek an in-person evaluation if the symptoms worsen or if the condition fails to improve as anticipated.  I provided 23 minutes of non-face-to-face time during this encounter.   Sandrea Hughs, MD                 Past Medical History:  Diagnosis Date  . Atrial fibrillation (HCC)   . Chronic lower back pain   . Depression   . DVT (deep venous thrombosis) (HCC) 1990s   LLE  . [employer]  arches   . Hypertension   . Hyperthyroidism ~ 2000   "fine now" (04/23/2016)  . Joint pain   . Lactose intolerance   . Leg edema   . Obesity   . Pneumonia 1980s X 1  . Snoring    has not had sleep study but suspects sleep apnea

## 2020-03-22 NOTE — Assessment & Plan Note (Signed)
ERV 10% by PFT's 03/05/20     Lab Results  Component Value Date   TSH 3.710 08/11/2019     Contributing to gerd risk/ doe/reviewed the need and the process to achieve and maintain neg calorie balance > defer f/u primary care including intermittently monitoring thyroid status    Advised may begin to feel some improvement p losing as little as 25 lbs as her legs are conditioned now to carry 140 plus extra wt which may ironically be one of the advantages of being chronically obese.  >>> no pulmonary f/u needed

## 2020-03-22 NOTE — Assessment & Plan Note (Signed)
?   Onset around Jan 2021 ? Related to Afib - 02/09/2020   Walked RA x one lap =  approx 250 ft -@ slow pace stopped due to sob with sats still around 95% - Echo 02/23/20 1. Left ventricular ejection fraction, by estimation, is 55 to 60%. The  left ventricle has normal function. The left ventricle has no regional  wall motion abnormalities. There is moderate left ventricular hypertrophy.  Left ventricular diastolic function  could not be evaluated.  2. Right ventricular systolic function is normal. The right ventricular  size is normal. There is normal pulmonary artery systolic pressure.  3. Left atrial size was severely dilated.  4. The mitral valve is abnormal. Mild to moderate mitral valve  regurgitation.  5. The aortic valve is tricuspid. Aortic valve regurgitation is not  visualized.  6. Aortic dilatation noted. There is mild dilatation of the ascending  aorta measuring 42 mm.  7. The inferior vena cava is normal in size with greater than 50%  respiratory variability, suggesting right atrial pressure of 3 mmHg.    PFTs 03/05/20 nl except for ERV 10% c/w obesity effects   When respiratory symptoms begin or become refractory well after a patient reports complete smoking cessation,  Especially when this wasn't the case while they were smoking, a red flag is raised based on the work of Dr Primitivo Gauze which states: if you quit smoking when your best day FEV1 is still well preserved it is highly unlikely you will progress to severe disease.  That is to say, once the smoking stops,  the symptoms should not suddenly erupt or markedly worsen.  If so, the differential diagnosis should include  obesity/deconditioning,  LPR/Reflux/Aspiration syndromes,  occult CHF, or  especially side effect of medications commonly used in this population.    >>>> With wt gain of > 140 lbs since quit smoking and severe LAE on echo we don't need to look further at pulmonary issues and f/u here can be prn/ pt  advised : cards and PCP f/u planned    Each maintenance medication was reviewed in detail including most importantly the difference between maintenance and as needed and under what circumstances the prns are to be used.  Please see AVS for specific  Instructions which are unique to this visit and I personally typed out  which were reviewed in detail over the phone with the patient and a copy provided MyChart

## 2020-04-04 ENCOUNTER — Other Ambulatory Visit: Payer: Self-pay

## 2020-04-04 ENCOUNTER — Encounter (INDEPENDENT_AMBULATORY_CARE_PROVIDER_SITE_OTHER): Payer: Self-pay | Admitting: Family Medicine

## 2020-04-04 ENCOUNTER — Ambulatory Visit (INDEPENDENT_AMBULATORY_CARE_PROVIDER_SITE_OTHER): Payer: Medicare Other | Admitting: Family Medicine

## 2020-04-04 VITALS — BP 146/84 | HR 63 | Temp 98.2°F | Ht 62.0 in | Wt 335.0 lb

## 2020-04-04 DIAGNOSIS — R7303 Prediabetes: Secondary | ICD-10-CM | POA: Diagnosis not present

## 2020-04-04 DIAGNOSIS — Z6841 Body Mass Index (BMI) 40.0 and over, adult: Secondary | ICD-10-CM | POA: Diagnosis not present

## 2020-04-04 DIAGNOSIS — I10 Essential (primary) hypertension: Secondary | ICD-10-CM

## 2020-04-04 MED ORDER — OZEMPIC (0.25 OR 0.5 MG/DOSE) 2 MG/1.5ML ~~LOC~~ SOPN: 0.2500 mg | PEN_INJECTOR | SUBCUTANEOUS | 0 refills | Status: DC

## 2020-04-05 ENCOUNTER — Encounter (INDEPENDENT_AMBULATORY_CARE_PROVIDER_SITE_OTHER): Payer: Self-pay | Admitting: Family Medicine

## 2020-04-05 NOTE — Progress Notes (Signed)
Chief Complaint:   OBESITY [name] is here to discuss her progress with her obesity treatment plan along with follow-up of her obesity related diagnoses. [name] is on keeping a food journal and adhering to recommended goals of 1300-1600 calories and 90 grams of protein daily and states she is following her eating plan approximately 80% of the time. [name] states she is doing 0 minutes 0 times per week.  Today's visit was #: 24 Starting weight: 296 lbs Starting date: 02/08/2018 Today's weight: 335 lbs Today's date: 04/04/2020 Total lbs lost to date: 0 Total lbs lost since last in-office visit: 0  Interim History: [name] is averaging about 60 grams of protein in daily and 1200-1300 calories per day. She struggles to get in enough protein. She is eating cereal with almond milk versus Fairlife. She does admit to cravings at times.   Subjective:   1. Pre-diabetes [name] has a diagnosis of pre-diabetes based on her elevated Hgb A1c and was informed this puts her at greater risk of developing diabetes. She notes cravings and denies polyphagia. She is not on metformin. She continues to work on diet and exercise to decrease her risk of diabetes. She denies nausea or hypoglycemia.  Lab Results  Component Value Date   HGBA1C 5.6 11/17/2019   Lab Results  Component Value Date   INSULIN 9.2 11/17/2019   INSULIN 14.4 08/11/2019   INSULIN 4.8 06/30/2018   INSULIN 5.8 02/08/2018   2. Essential hypertension [name]'s blood pressure is elevated today. She is compliant with medications. She is on lopressor and Maxzide. Cardiovascular ROS: no chest pain or dyspnea on exertion.  BP Readings from Last 3 Encounters:  04/04/20 (!) 146/84  03/19/20 126/82  03/06/20 (!) 156/90   Lab Results  Component Value Date   CREATININE 0.83 11/17/2019   CREATININE 0.88 08/11/2019   CREATININE 0.71 08/24/2018   Assessment/Plan:   1. Pre-diabetes [name] will continue to work on weight loss, exercise, and  decreasing simple carbohydrates to help decrease the risk of diabetes. [name] agreed to start [name] 0.25 mg SubQ weekly with no refills.   - Semaglutide,0.25 or 0.5MG /DOS, ([name], 0.25 OR 0.5 MG/DOSE,) 2 MG/1.5ML SOPN; Inject 0.1875 mLs (0.25 mg total) into the skin once a week.  Dispense: 1 pen; Refill: 0  [name] denies personal or family history of thyroid cancer, history of pancreatitis, or current cholelithiasis. [name] was informed of side effects.  2. Essential hypertension [name] is working on healthy weight loss and exercise to improve blood pressure control. We will watch for signs of hypotension as she continues her lifestyle modifications. [name] will continue all of her medications.  3. Class 3 severe obesity with serious comorbidity and body mass index (BMI) of 60.0 to 69.9 in adult, unspecified obesity type [name]) [name] is currently in the action stage of change. As such, her goal is to continue with weight loss efforts. She has agreed to keeping a food journal and adhering to recommended goals of 1300-1600 calories and 80 grams of protein daily.   We discussed planning to have protein at all meals: 20 grams of protein at breakfast, 20 grams of protein at lunch, 30 grams of protein at dinner, and 10 grams of protein for snack.  Exercise goals: All adults should avoid inactivity. Some physical activity is better than none, and adults who participate in any amount of physical activity gain some health benefits.  Behavioral modification strategies: increasing lean protein intake and keeping a strict food journal.  [name]  has agreed to follow-up with our clinic in 2 to 3 weeks. She was informed of the importance of frequent follow-up visits to maximize her success with intensive lifestyle modifications for her multiple health conditions.   Objective:   Blood pressure (!) 146/84, pulse 63, temperature 98.2 F (36.8 C), temperature source Oral, height 5\' 2"  (1.575 m), weight (!) 335 lb  (152 kg), SpO2 96 %. Body mass index is 61.27 kg/m.  General: Cooperative, alert, well developed, in no acute distress. HEENT: Conjunctivae and lids unremarkable. Cardiovascular: Regular rhythm.  Lungs: Normal work of breathing. Neurologic: No focal deficits.   Lab Results  Component Value Date   CREATININE 0.83 11/17/2019   BUN 25 11/17/2019   NA 144 11/17/2019   K 4.6 11/17/2019   CL 104 11/17/2019   CO2 21 11/17/2019   Lab Results  Component Value Date   ALT 11 11/17/2019   AST 18 11/17/2019   ALKPHOS 56 11/17/2019   BILITOT 0.3 11/17/2019   Lab Results  Component Value Date   HGBA1C 5.6 11/17/2019   HGBA1C 5.8 (H) 08/11/2019   HGBA1C 5.5 06/30/2018   HGBA1C 5.7 (H) 02/08/2018   Lab Results  Component Value Date   INSULIN 9.2 11/17/2019   INSULIN 14.4 08/11/2019   INSULIN 4.8 06/30/2018   INSULIN 5.8 02/08/2018   Lab Results  Component Value Date   TSH 3.710 08/11/2019   Lab Results  Component Value Date   CHOL 166 02/08/2018   HDL 75 02/08/2018   LDLCALC 76 02/08/2018   TRIG 75 02/08/2018   CHOLHDL 2.2 02/08/2018   Lab Results  Component Value Date   WBC 11.2 (H) 08/24/2018   HGB 12.9 08/24/2018   HCT 40.3 08/24/2018   MCV 84.7 08/24/2018   PLT 471 (H) 08/24/2018   No results found for: IRON, TIBC, FERRITIN  Obesity Behavioral Intervention Documentation for Insurance:   Approximately 15 minutes were spent on the discussion below.  ASK: We discussed the diagnosis of obesity with [name] today and [name] agreed to give [place] permission to discuss obesity behavioral modification therapy today.  ASSESS: [name] has the diagnosis of obesity and her BMI today is 61.26. [name] is in the action stage of change.   ADVISE: [name] was educated on the multiple health risks of obesity as well as the benefit of weight loss to improve her health. She was advised of the need for long term treatment and the importance of lifestyle modifications to improve her  current health and to decrease her risk of future health problems.  AGREE: Multiple dietary modification options and treatment options were discussed and [name] agreed to follow the recommendations documented in the above note.  ARRANGE: [name] was educated on the importance of frequent visits to treat obesity as outlined per CMS and USPSTF guidelines and agreed to schedule her next follow up appointment today.  Attestation Statements:   Reviewed by clinician on day of visit: allergies, medications, problem list, medical history, surgical history, family history, social history, and previous encounter notes.   Wilhemena Durie, am acting as Location manager for [employer], FNP-C.  I have reviewed the above documentation for accuracy and completeness, and I agree with the above. -  Georgianne Fick, FNP

## 2020-04-20 ENCOUNTER — Other Ambulatory Visit (INDEPENDENT_AMBULATORY_CARE_PROVIDER_SITE_OTHER): Payer: Self-pay | Admitting: Family Medicine

## 2020-04-20 DIAGNOSIS — E559 Vitamin D deficiency, unspecified: Secondary | ICD-10-CM

## 2020-04-25 ENCOUNTER — Encounter (INDEPENDENT_AMBULATORY_CARE_PROVIDER_SITE_OTHER): Payer: Self-pay | Admitting: Family Medicine

## 2020-04-25 ENCOUNTER — Other Ambulatory Visit: Payer: Self-pay

## 2020-04-25 ENCOUNTER — Ambulatory Visit (INDEPENDENT_AMBULATORY_CARE_PROVIDER_SITE_OTHER): Payer: Medicare Other | Admitting: Family Medicine

## 2020-04-25 ENCOUNTER — Other Ambulatory Visit (INDEPENDENT_AMBULATORY_CARE_PROVIDER_SITE_OTHER): Payer: Self-pay | Admitting: Family Medicine

## 2020-04-25 VITALS — BP 113/64 | HR 61 | Temp 98.1°F | Ht 62.0 in | Wt 327.0 lb

## 2020-04-25 DIAGNOSIS — R0602 Shortness of breath: Secondary | ICD-10-CM

## 2020-04-25 DIAGNOSIS — Z6841 Body Mass Index (BMI) 40.0 and over, adult: Secondary | ICD-10-CM | POA: Diagnosis not present

## 2020-04-25 DIAGNOSIS — R7303 Prediabetes: Secondary | ICD-10-CM

## 2020-04-25 DIAGNOSIS — E559 Vitamin D deficiency, unspecified: Secondary | ICD-10-CM | POA: Diagnosis not present

## 2020-04-25 DIAGNOSIS — F3289 Other specified depressive episodes: Secondary | ICD-10-CM | POA: Diagnosis not present

## 2020-04-25 MED ORDER — BUPROPION HCL ER (SR) 150 MG PO TB12: 150.0000 mg | ORAL_TABLET | Freq: Every day | ORAL | 0 refills | Status: DC

## 2020-04-25 MED ORDER — VITAMIN D (ERGOCALCIFEROL) 1.25 MG (50000 UNIT) PO CAPS: 50000.0000 [IU] | ORAL_CAPSULE | ORAL | 0 refills | Status: DC

## 2020-04-26 LAB — COMPREHENSIVE METABOLIC PANEL
ALT: 9 IU/L (ref 0–32)
AST: 12 IU/L (ref 0–40)
Albumin/Globulin Ratio: 1.8 (ref 1.2–2.2)
Albumin: 4.4 g/dL (ref 3.8–4.8)
Alkaline Phosphatase: 60 IU/L (ref 48–121)
BUN/Creatinine Ratio: 21 (ref 12–28)
BUN: 24 mg/dL (ref 8–27)
Bilirubin Total: 0.3 mg/dL (ref 0.0–1.2)
CO2: 18 mmol/L — ABNORMAL LOW (ref 20–29)
Calcium: 9.1 mg/dL (ref 8.7–10.3)
Chloride: 103 mmol/L (ref 96–106)
Creatinine, Ser: 1.15 mg/dL — ABNORMAL HIGH (ref 0.57–1.00)
GFR calc Af Amer: 59 mL/min/{1.73_m2} — ABNORMAL LOW (ref >59)
GFR calc non Af Amer: 51 mL/min/{1.73_m2} — ABNORMAL LOW (ref >59)
Globulin, Total: 2.5 g/dL (ref 1.5–4.5)
Glucose: 88 mg/dL (ref 65–99)
Potassium: 4.1 mmol/L (ref 3.5–5.2)
Sodium: 139 mmol/L (ref 134–144)
Total Protein: 6.9 g/dL (ref 6.0–8.5)

## 2020-04-26 LAB — LIPID PANEL WITH LDL/HDL RATIO
Cholesterol, Total: 157 mg/dL (ref 100–199)
HDL: 67 mg/dL (ref >39)
LDL Chol Calc (NIH): 74 mg/dL (ref 0–99)
LDL/HDL Ratio: 1.1 ratio (ref 0.0–3.2)
Triglycerides: 88 mg/dL (ref 0–149)
VLDL Cholesterol Cal: 16 mg/dL (ref 5–40)

## 2020-04-26 LAB — INSULIN, RANDOM: INSULIN: 13.4 u[IU]/mL (ref 2.6–24.9)

## 2020-04-26 LAB — HEMOGLOBIN A1C
Est. average glucose Bld gHb Est-mCnc: 111 mg/dL
Hgb A1c MFr Bld: 5.5 % (ref 4.8–5.6)

## 2020-04-26 LAB — VITAMIN D 25 HYDROXY (VIT D DEFICIENCY, FRACTURES): Vit D, 25-Hydroxy: 56 ng/mL (ref 30.0–100.0)

## 2020-05-01 ENCOUNTER — Encounter (INDEPENDENT_AMBULATORY_CARE_PROVIDER_SITE_OTHER): Payer: Self-pay | Admitting: Family Medicine

## 2020-05-01 NOTE — Progress Notes (Signed)
Chief Complaint:   OBESITY [name] is here to discuss her progress with her obesity treatment plan along with follow-up of her obesity related diagnoses. [name] is on keeping a food journal and adhering to recommended goals of 1300-1600 calories and 80 grams of protein daily and states she is following her eating plan approximately 70% of the time. [name] states she is doing 0 minutes 0 times per week.  Today's visit was #: 25 Starting weight: 296 lbs Starting date: 02/08/2018 Today's weight: 327 lbs Today's date: 04/25/2020 Total lbs lost to date: 0 Total lbs lost since last in-office visit: 8  Interim History: [name] notes doing better with her protein intake and averages  70 grams of protein daily. She is journaling, and but she notes she "falls off" of journaling after a few weeks.  Subjective:   1. Vitamin D deficiency [name]'s last Vit D level was nearly at goal at 48.1. She is on weekly prescription Vit D.   2. Pre-diabetes [name] has a diagnosis of pre-diabetes based on her elevated Hgb A1c and was informed this puts her at greater risk of developing diabetes. [name] was started on Ozempic at her last visit. She notes decreased appetite and mild occasional nausea. She continues to work on diet and exercise to decrease her risk of diabetes. She denies nausea or hypoglycemia.  Lab Results  Component Value Date   HGBA1C 5.5 04/25/2020   Lab Results  Component Value Date   INSULIN 13.4 04/25/2020   INSULIN 9.2 11/17/2019   INSULIN 14.4 08/11/2019   INSULIN 4.8 06/30/2018   INSULIN 5.8 02/08/2018   3. Shortness of breath on exertion [name] notes dyspnea with exertion. Her recent Pulmonology testing shows no lung issues.   4. Other depression, with emotional eating [name]'s mood is stable. Her cravings are well controlled especially since Ozempic was added.  Assessment/Plan:   1. Vitamin D deficiency Low Vitamin D level contributes to fatigue and are associated with  obesity, breast, and colon cancer. We will check labs today, and we will refill prescription Vitamin D for 1 month. [name] will follow-up for routine testing of Vitamin D, at least 2-3 times per year to avoid over-replacement.  - VITAMIN D 25 Hydroxy (Vit-D Deficiency, Fractures)  - Vitamin D, Ergocalciferol, (DRISDOL) 1.25 MG (50000 UNIT) CAPS capsule; Take 1 capsule (50,000 Units total) by mouth every 7 (seven) days.  Dispense: 4 capsule; Refill: 0  2. Pre-diabetes [name] will continue to work on weight loss, exercise, and decreasing simple carbohydrates to help decrease the risk of diabetes. [name] agreed to continue Ozempic and we will check labs today.  - Hemoglobin A1c - Insulin, random  3. Shortness of breath on exertion SOB likely due to obesity and lack of conditioning. We will check labs today, and [name] will follow up as directed.  - Lipid Panel With LDL/HDL Ratio  4. Other depression, with emotional eating Behavior modification techniques were discussed today to help [name] deal with her emotional/non-hunger eating behaviors.  Orders and follow up as documented in patient record.   - buPROPion (WELLBUTRIN SR) 150 MG 12 hr tablet; Take 1 tablet (150 mg total) by mouth daily.  Dispense: 30 tablet; Refill: 0  5. Class 3 severe obesity with serious comorbidity and body mass index (BMI) of 50.0 to 59.9 in adult, unspecified obesity type [name]) [name] is currently in the action stage of change. As such, her goal is to continue with weight loss efforts. She has agreed to keeping  a food journal and adhering to recommended goals of 1300-1600 calories and 80 grams of protein daily.   Exercise goals: No exercise has been prescribed at this time.  Behavioral modification strategies: increasing lean protein intake and keeping a strict food journal.  [name] has agreed to follow-up with our clinic in 2 weeks. She was informed of the importance of frequent follow-up visits to maximize her  success with intensive lifestyle modifications for her multiple health conditions.   [name] was informed we would discuss her lab results at her next visit unless there is a critical issue that needs to be addressed sooner. [name] agreed to keep her next visit at the agreed upon time to discuss these results.  Objective:   Blood pressure 113/64, pulse 61, temperature 98.1 F (36.7 C), temperature source Oral, height 5\' 2"  (1.575 m), weight (!) 327 lb (148.3 kg), SpO2 97 %. Body mass index is 59.81 kg/m.  General: Cooperative, alert, well developed, in no acute distress. HEENT: Conjunctivae and lids unremarkable. Cardiovascular: Regular rhythm.  Lungs: Normal work of breathing. Neurologic: No focal deficits.   Lab Results  Component Value Date   CREATININE 1.15 (H) 04/25/2020   BUN 24 04/25/2020   NA 139 04/25/2020   K 4.1 04/25/2020   CL 103 04/25/2020   CO2 18 (L) 04/25/2020   Lab Results  Component Value Date   ALT 9 04/25/2020   AST 12 04/25/2020   ALKPHOS 60 04/25/2020   BILITOT 0.3 04/25/2020   Lab Results  Component Value Date   HGBA1C 5.5 04/25/2020   HGBA1C 5.6 11/17/2019   HGBA1C 5.8 (H) 08/11/2019   HGBA1C 5.5 06/30/2018   HGBA1C 5.7 (H) 02/08/2018   Lab Results  Component Value Date   INSULIN 13.4 04/25/2020   INSULIN 9.2 11/17/2019   INSULIN 14.4 08/11/2019   INSULIN 4.8 06/30/2018   INSULIN 5.8 02/08/2018   Lab Results  Component Value Date   TSH 3.710 08/11/2019   Lab Results  Component Value Date   CHOL 157 04/25/2020   HDL 67 04/25/2020   LDLCALC 74 04/25/2020   TRIG 88 04/25/2020   CHOLHDL 2.2 02/08/2018   Lab Results  Component Value Date   WBC 11.2 (H) 08/24/2018   HGB 12.9 08/24/2018   HCT 40.3 08/24/2018   MCV 84.7 08/24/2018   PLT 471 (H) 08/24/2018   No results found for: IRON, TIBC, FERRITIN  Obesity Behavioral Intervention Documentation for Insurance:   Approximately 15 minutes were spent on the discussion  below.  ASK: We discussed the diagnosis of obesity with 08/26/2018 today and [name] agreed to give [name] permission to discuss obesity behavioral modification therapy today.  ASSESS: [name] has the diagnosis of obesity and her BMI today is 59.79. [name] is in the action stage of change.   ADVISE: [name] was educated on the multiple health risks of obesity as well as the benefit of weight loss to improve her health. She was advised of the need for long term treatment and the importance of lifestyle modifications to improve her current health and to decrease her risk of future health problems.  AGREE: Multiple dietary modification options and treatment options were discussed and [name] agreed to follow the recommendations documented in the above note.  ARRANGE: [name] was educated on the importance of frequent visits to treat obesity as outlined per CMS and USPSTF guidelines and agreed to schedule her next follow up appointment today.  Attestation Statements:   Reviewed by clinician on day of visit:  allergies, medications, problem list, medical history, surgical history, family history, social history, and previous encounter notes.   Wilhemena Durie, am acting as Location manager for [employer], FNP-C.  I have reviewed the above documentation for accuracy and completeness, and I agree with the above. -  Georgianne Fick, FNP

## 2020-05-09 ENCOUNTER — Ambulatory Visit (INDEPENDENT_AMBULATORY_CARE_PROVIDER_SITE_OTHER): Payer: Medicare Other | Admitting: Family Medicine

## 2020-05-09 ENCOUNTER — Other Ambulatory Visit: Payer: Self-pay

## 2020-05-09 ENCOUNTER — Encounter (INDEPENDENT_AMBULATORY_CARE_PROVIDER_SITE_OTHER): Payer: Self-pay | Admitting: Family Medicine

## 2020-05-09 VITALS — BP 124/75 | HR 82 | Temp 98.3°F | Ht 62.0 in | Wt 324.0 lb

## 2020-05-09 DIAGNOSIS — E8881 Metabolic syndrome: Secondary | ICD-10-CM

## 2020-05-09 DIAGNOSIS — R944 Abnormal results of kidney function studies: Secondary | ICD-10-CM | POA: Diagnosis not present

## 2020-05-09 DIAGNOSIS — Z6841 Body Mass Index (BMI) 40.0 and over, adult: Secondary | ICD-10-CM

## 2020-05-09 DIAGNOSIS — E559 Vitamin D deficiency, unspecified: Secondary | ICD-10-CM | POA: Diagnosis not present

## 2020-05-09 MED ORDER — OZEMPIC (0.25 OR 0.5 MG/DOSE) 2 MG/1.5ML ~~LOC~~ SOPN: 0.2500 mg | PEN_INJECTOR | SUBCUTANEOUS | 0 refills | Status: DC

## 2020-05-10 NOTE — Progress Notes (Signed)
Chief Complaint:   OBESITY Bijal is here to discuss her progress with her obesity treatment plan along with follow-up of her obesity related diagnoses. Charlee is on keeping a food journal and adhering to recommended goals of 1300-1600 calories and 80 grams of protein daily and states she is following her eating plan approximately 50-60% of the time. Gavriela states she is doing 0 minutes 0 times per week.  Today's visit was #: 26 Starting weight: 296 lbs Starting date: 02/08/2018 Today's weight: 324 lbs Today's date: 05/09/2020 Total lbs lost to date: 0 Total lbs lost since last in-office visit: 3  Interim History: Analea has not journaled consistently but still lost 3 lbs. She meets protein goals 60-70 % of the time. She meets her protein goal when she journals.  Subjective:   1. Decreased GFR Chrishawna's GFR has decreased from 76 to 51. She does not take NSAIDS.  2. Insulin resistance Tamzin has a diagnosis of insulin resistance based on her elevated fasting insulin level >5. She has previously been on pre-diabetic range. Last A1c was 5.5. She is on Ozempic and notes nausea if she does not eat. She denies constipation. She continues to work on diet and exercise to decrease her risk of diabetes.  Lab Results  Component Value Date   INSULIN 13.4 04/25/2020   INSULIN 9.2 11/17/2019   INSULIN 14.4 08/11/2019   INSULIN 4.8 06/30/2018   INSULIN 5.8 02/08/2018   Lab Results  Component Value Date   HGBA1C 5.5 04/25/2020   3. Vitamin D deficiency Chonte's Vit D level is at goal. She is on prescription Vit D 50,000 IU.   Assessment/Plan:   1. Decreased GFR We will continue to monitor. Viriginia is to avoid NSAIDS and increase water.    2. Insulin resistance Calley will continue to work on weight loss, exercise, and decreasing simple carbohydrates to help decrease the risk of diabetes. we will refill Ozempic for 1 month. Lakresha agreed to follow-up with [place] as directed to closely  monitor her progress.  - Semaglutide,0.25 or 0.5MG /DOS, (OZEMPIC, 0.25 OR 0.5 MG/DOSE,) 2 MG/1.5ML SOPN; Inject 0.1875 mLs (0.25 mg total) into the skin once a week.  Dispense: 1 pen; Refill: 0  3. Vitamin D deficiency Low Vitamin D level contributes to fatigue and are associated with obesity, breast, and colon cancer. Reese agreed to continue taking prescription Vitamin D 50,000 IU every week and will follow-up for routine testing of Vitamin D, at least 2-3 times per year to avoid over-replacement.  4. Class 3 severe obesity with serious comorbidity and body mass index (BMI) of 50.0 to 59.9 in adult, unspecified obesity type [facility]) Anishka is currently in the action stage of change. As such, her goal is to continue with weight loss efforts. She has agreed to keeping a food journal and adhering to recommended goals of 1300-1600 calories and 80 grams of protein daily.   We discussed the importance of journaling consistently.  Exercise goals: All adults should avoid inactivity. Some physical activity is better than none, and adults who participate in any amount of physical activity gain some health benefits.We discussed starting chair exercises.  Behavioral modification strategies: increasing lean protein intake, no skipping meals, better snacking choices and planning for success.  Yilia has agreed to follow-up with our clinic in 2 weeks. She was informed of the importance of frequent follow-up visits to maximize her success with intensive lifestyle modifications for her multiple health conditions.   Objective:   Blood pressure  124/75, pulse 82, temperature 98.3 F (36.8 C), temperature source Oral, height 5\' 2"  (1.575 m), weight (!) 324 lb (147 kg), SpO2 98 %. Body mass index is 59.26 kg/m.  General: Cooperative, alert, well developed, in no acute distress. HEENT: Conjunctivae and lids unremarkable. Cardiovascular: Regular rhythm.  Lungs: Normal work of breathing. Neurologic: No focal  deficits.   Lab Results  Component Value Date   CREATININE 1.15 (H) 04/25/2020   BUN 24 04/25/2020   NA 139 04/25/2020   K 4.1 04/25/2020   CL 103 04/25/2020   CO2 18 (L) 04/25/2020   Lab Results  Component Value Date   ALT 9 04/25/2020   AST 12 04/25/2020   ALKPHOS 60 04/25/2020   BILITOT 0.3 04/25/2020   Lab Results  Component Value Date   HGBA1C 5.5 04/25/2020   HGBA1C 5.6 11/17/2019   HGBA1C 5.8 (H) 08/11/2019   HGBA1C 5.5 06/30/2018   HGBA1C 5.7 (H) 02/08/2018   Lab Results  Component Value Date   INSULIN 13.4 04/25/2020   INSULIN 9.2 11/17/2019   INSULIN 14.4 08/11/2019   INSULIN 4.8 06/30/2018   INSULIN 5.8 02/08/2018   Lab Results  Component Value Date   TSH 3.710 08/11/2019   Lab Results  Component Value Date   CHOL 157 04/25/2020   HDL 67 04/25/2020   LDLCALC 74 04/25/2020   TRIG 88 04/25/2020   CHOLHDL 2.2 02/08/2018   Lab Results  Component Value Date   WBC 11.2 (H) 08/24/2018   HGB 12.9 08/24/2018   HCT 40.3 08/24/2018   MCV 84.7 08/24/2018   PLT 471 (H) 08/24/2018   No results found for: IRON, TIBC, FERRITIN  Obesity Behavioral Intervention Documentation for Insurance:   Approximately 15 minutes were spent on the discussion below.  ASK: We discussed the diagnosis of obesity with 08/26/2018 today and Seairra agreed to give Rinaldo Cloud permission to discuss obesity behavioral modification therapy today.  ASSESS: Monifa has the diagnosis of obesity and her BMI today is 59.25. Rozlyn is in the action stage of change.   ADVISE: Elmarie was educated on the multiple health risks of obesity as well as the benefit of weight loss to improve her health. She was advised of the need for long term treatment and the importance of lifestyle modifications to improve her current health and to decrease her risk of future health problems.  AGREE: Multiple dietary modification options and treatment options were discussed and Jewelianna agreed to follow the recommendations  documented in the above note.  ARRANGE: Talaysia was educated on the importance of frequent visits to treat obesity as outlined per CMS and USPSTF guidelines and agreed to schedule her next follow up appointment today.  Attestation Statements:   Reviewed by clinician on day of visit: allergies, medications, problem list, medical history, surgical history, family history, social history, and previous encounter notes.   Rinaldo Cloud, am acting as Trude Mcburney for [employer], FNP-C.  I have reviewed the above documentation for accuracy and completeness, and I agree with the above. -  [name], FNP

## 2020-05-11 ENCOUNTER — Encounter (INDEPENDENT_AMBULATORY_CARE_PROVIDER_SITE_OTHER): Payer: Self-pay | Admitting: Family Medicine

## 2020-05-11 DIAGNOSIS — E8881 Metabolic syndrome: Secondary | ICD-10-CM | POA: Insufficient documentation

## 2020-05-11 DIAGNOSIS — E88819 Insulin resistance, unspecified: Secondary | ICD-10-CM | POA: Insufficient documentation

## 2020-05-23 ENCOUNTER — Ambulatory Visit (INDEPENDENT_AMBULATORY_CARE_PROVIDER_SITE_OTHER): Payer: Medicare Other | Admitting: Family Medicine

## 2020-05-23 ENCOUNTER — Other Ambulatory Visit: Payer: Self-pay

## 2020-05-23 ENCOUNTER — Other Ambulatory Visit (INDEPENDENT_AMBULATORY_CARE_PROVIDER_SITE_OTHER): Payer: Self-pay | Admitting: Family Medicine

## 2020-05-23 ENCOUNTER — Encounter (INDEPENDENT_AMBULATORY_CARE_PROVIDER_SITE_OTHER): Payer: Self-pay | Admitting: Family Medicine

## 2020-05-23 VITALS — BP 126/79 | HR 61 | Temp 98.1°F | Ht 62.0 in | Wt 325.0 lb

## 2020-05-23 DIAGNOSIS — F3289 Other specified depressive episodes: Secondary | ICD-10-CM

## 2020-05-23 DIAGNOSIS — Z6841 Body Mass Index (BMI) 40.0 and over, adult: Secondary | ICD-10-CM | POA: Diagnosis not present

## 2020-05-23 DIAGNOSIS — E559 Vitamin D deficiency, unspecified: Secondary | ICD-10-CM

## 2020-05-23 MED ORDER — BUPROPION HCL ER (SR) 200 MG PO TB12: 200.0000 mg | ORAL_TABLET | Freq: Every day | ORAL | 0 refills | Status: DC

## 2020-05-23 MED ORDER — VITAMIN D (ERGOCALCIFEROL) 1.25 MG (50000 UNIT) PO CAPS: 50000.0000 [IU] | ORAL_CAPSULE | ORAL | 0 refills | Status: DC

## 2020-05-23 NOTE — Progress Notes (Signed)
Chief Complaint:   OBESITY [name] is here to discuss her progress with her obesity treatment plan along with follow-up of her obesity related diagnoses. [name] is on keeping a food journal and adhering to recommended goals of 1300-1600 calories and 80 grams of protein daily and states she is following her eating plan approximately 75-80% of the time. [name] states she is doing 0 minutes 0 times per week.  Today's visit was #: 27 Starting weight: 296 lbs Starting date: 02/08/2018 Today's weight: 325 lbs Today's date: 05/23/2020 Total lbs lost to date: 0 Total lbs lost since last in-office visit: 0  Interim History: [name] is still inconsistent with journaling. She is unsure why she does not do it. She also does not eat enough protein.  Subjective:   1. Vitamin D deficiency [name]'s last Vit D level was at goal. She is on prescription Vit D.  2. Other depression, with emotional eating [name] notes some cravings. She feels bupropion helps.  Assessment/Plan:   1. Vitamin D deficiency Low Vitamin D level contributes to fatigue and are associated with obesity, breast, and colon cancer. We will refill prescription Vitamin D for 1 month. [name] will follow-up for routine testing of Vitamin D, at least 2-3 times per year to avoid over-replacement.  - Vitamin D, Ergocalciferol, (DRISDOL) 1.25 MG (50000 UNIT) CAPS capsule; Take 1 capsule (50,000 Units total) by mouth every 7 (seven) days.  Dispense: 4 capsule; Refill: 0  2. Other depression, with emotional eating Behavior modification techniques were discussed today to help [name] deal with her emotional/non-hunger eating behaviors. [name] agreed to increase bupropion to 200 mg q AM and we will refill for 1 month. Orders and follow up as documented in patient record.   - buPROPion (WELLBUTRIN SR) 200 MG 12 hr tablet; Take 1 tablet (200 mg total) by mouth daily.  Dispense: 30 tablet; Refill: 0  3. Class 3 severe obesity with serious  comorbidity and body mass index (BMI) of 50.0 to 59.9 in adult, unspecified obesity type [name]) [name] is currently in the action stage of change. As such, her goal is to continue with weight loss efforts. She has agreed to the Category 3 Plan with breakfast and lunch options.   Exercise goals: All adults should avoid inactivity. Some physical activity is better than none, and adults who participate in any amount of physical activity gain some health benefits.  Behavioral modification strategies: increasing lean protein intake and decreasing simple carbohydrates.  [name] has agreed to follow-up with our clinic in 2 weeks. She was informed of the importance of frequent follow-up visits to maximize her success with intensive lifestyle modifications for her multiple health conditions.   Objective:   Blood pressure 126/79, pulse 61, temperature 98.1 F (36.7 C), temperature source Oral, height 5\' 2"  (1.575 m), weight (!) 325 lb (147.4 kg), SpO2 97 %. Body mass index is 59.44 kg/m.  [name] uses a cane for ambulatory. General: Cooperative, alert, well developed, in no acute distress. HEENT: Conjunctivae and lids unremarkable. Cardiovascular: Regular rhythm.  Lungs: Normal work of breathing. Neurologic: No focal deficits.   Lab Results  Component Value Date   CREATININE 1.15 (H) 04/25/2020   BUN 24 04/25/2020   NA 139 04/25/2020   K 4.1 04/25/2020   CL 103 04/25/2020   CO2 18 (L) 04/25/2020   Lab Results  Component Value Date   ALT 9 04/25/2020   AST 12 04/25/2020   ALKPHOS 60 04/25/2020   BILITOT 0.3 04/25/2020  Lab Results  Component Value Date   HGBA1C 5.5 04/25/2020   HGBA1C 5.6 11/17/2019   HGBA1C 5.8 (H) 08/11/2019   HGBA1C 5.5 06/30/2018   HGBA1C 5.7 (H) 02/08/2018   Lab Results  Component Value Date   INSULIN 13.4 04/25/2020   INSULIN 9.2 11/17/2019   INSULIN 14.4 08/11/2019   INSULIN 4.8 06/30/2018   INSULIN 5.8 02/08/2018   Lab Results  Component Value Date     TSH 3.710 08/11/2019   Lab Results  Component Value Date   CHOL 157 04/25/2020   HDL 67 04/25/2020   LDLCALC 74 04/25/2020   TRIG 88 04/25/2020   CHOLHDL 2.2 02/08/2018   Lab Results  Component Value Date   WBC 11.2 (H) 08/24/2018   HGB 12.9 08/24/2018   HCT 40.3 08/24/2018   MCV 84.7 08/24/2018   PLT 471 (H) 08/24/2018   No results found for: IRON, TIBC, FERRITIN  Obesity Behavioral Intervention Documentation for Insurance:   Approximately 15 minutes were spent on the discussion below.  ASK: We discussed the diagnosis of obesity with [name] today and [name] agreed to give [place] permission to discuss obesity behavioral modification therapy today.  ASSESS: [name] has the diagnosis of obesity and her BMI today is 59.43. [name] is in the action stage of change.   ADVISE: [name] was educated on the multiple health risks of obesity as well as the benefit of weight loss to improve her health. She was advised of the need for long term treatment and the importance of lifestyle modifications to improve her current health and to decrease her risk of future health problems.  AGREE: Multiple dietary modification options and treatment options were discussed and [name] agreed to follow the recommendations documented in the above note.  ARRANGE: [name] was educated on the importance of frequent visits to treat obesity as outlined per CMS and USPSTF guidelines and agreed to schedule her next follow up appointment today.  Attestation Statements:   Reviewed by clinician on day of visit: allergies, medications, problem list, medical history, surgical history, family history, social history, and previous encounter notes.   [name], am acting as Energy manager for [name], FNP-C.  I have reviewed the above documentation for accuracy and completeness, and I agree with the above. - Jesse Sans, FNP

## 2020-06-03 ENCOUNTER — Other Ambulatory Visit (INDEPENDENT_AMBULATORY_CARE_PROVIDER_SITE_OTHER): Payer: Self-pay | Admitting: Family Medicine

## 2020-06-03 DIAGNOSIS — E559 Vitamin D deficiency, unspecified: Secondary | ICD-10-CM

## 2020-06-05 ENCOUNTER — Ambulatory Visit (INDEPENDENT_AMBULATORY_CARE_PROVIDER_SITE_OTHER): Payer: Medicare Other | Admitting: Family Medicine

## 2020-06-05 ENCOUNTER — Other Ambulatory Visit: Payer: Self-pay

## 2020-06-05 ENCOUNTER — Encounter (INDEPENDENT_AMBULATORY_CARE_PROVIDER_SITE_OTHER): Payer: Self-pay | Admitting: Family Medicine

## 2020-06-05 VITALS — BP 126/86 | HR 64 | Temp 98.8°F | Ht 62.0 in | Wt 324.0 lb

## 2020-06-05 DIAGNOSIS — E8881 Metabolic syndrome: Secondary | ICD-10-CM

## 2020-06-05 DIAGNOSIS — Z6841 Body Mass Index (BMI) 40.0 and over, adult: Secondary | ICD-10-CM

## 2020-06-06 ENCOUNTER — Encounter (INDEPENDENT_AMBULATORY_CARE_PROVIDER_SITE_OTHER): Payer: Self-pay | Admitting: Family Medicine

## 2020-06-06 NOTE — Progress Notes (Signed)
Chief Complaint:   OBESITY [name] is here to discuss her progress with her obesity treatment plan along with follow-up of her obesity related diagnoses. [name] is on the Category 3 Plan with breakfast and lunch options and states she is following her eating plan approximately 75% of the time. [name] states she is doing 0 minutes 0 times per week.  Today's visit was #: 28 Starting weight: 296 lbs Starting date: 02/08/2018 Today's weight: 324 lbs Today's date: 06/05/2020 Total lbs lost to date: 0 Total lbs lost since last in-office visit: 1  Interim History: [name] was switched to the Category 3 plan at her last office visit, but she notes it has been difficult to stick to the plan because she has been used to journaling. She notes she has not had all the groceries to adhere well to the Category 3 plan.   Subjective:   1. Insulin resistance [name] has a diagnosis of insulin resistance based on her elevated fasting insulin level >5. [name] is on Ozempic 0.25 mg weekly, and she denies polyphagia. Last A1c was 5.5. She notes nausea on the day that she takes Ozempic. She continues to work on diet and exercise to decrease her risk of diabetes.  Lab Results  Component Value Date   INSULIN 13.4 04/25/2020   INSULIN 9.2 11/17/2019   INSULIN 14.4 08/11/2019   INSULIN 4.8 06/30/2018   INSULIN 5.8 02/08/2018   Lab Results  Component Value Date   HGBA1C 5.5 04/25/2020    Assessment/Plan:   1. Insulin resistance [name] will continue Ozempic, and will continue to work on weight loss, exercise, and decreasing simple carbohydrates to help decrease the risk of diabetes. [name] agreed to follow-up with [place] as directed to closely monitor her progress.  2. Class 3 severe obesity with serious comorbidity and body mass index (BMI) of 50.0 to 59.9 in adult, unspecified obesity type [name]) [name] is currently in the action stage of change. As such, her goal is to continue with weight loss efforts.  She has agreed to the Category 3 Plan.   [name] will work on getting groceries to adhere to the Category 3 plan.  Exercise goals: No exercise has been prescribed at this time.  Behavioral modification strategies: increasing lean protein intake and decreasing simple carbohydrates.  [name] has agreed to follow-up with our clinic in 2 weeks. She was informed of the importance of frequent follow-up visits to maximize her success with intensive lifestyle modifications for her multiple health conditions.   Objective:   Blood pressure 126/86, pulse 64, temperature 98.8 F (37.1 C), height 5\' 2"  (1.575 m), weight (!) 324 lb (147 kg), SpO2 97 %. Body mass index is 59.26 kg/m.  [name] uses a cane for ambulation. General: Cooperative, alert, well developed, in no acute distress. HEENT: Conjunctivae and lids unremarkable. Cardiovascular: Regular rhythm.  Lungs: Normal work of breathing. Neurologic: No focal deficits.   Lab Results  Component Value Date   CREATININE 1.15 (H) 04/25/2020   BUN 24 04/25/2020   NA 139 04/25/2020   K 4.1 04/25/2020   CL 103 04/25/2020   CO2 18 (L) 04/25/2020   Lab Results  Component Value Date   ALT 9 04/25/2020   AST 12 04/25/2020   ALKPHOS 60 04/25/2020   BILITOT 0.3 04/25/2020   Lab Results  Component Value Date   HGBA1C 5.5 04/25/2020   HGBA1C 5.6 11/17/2019   HGBA1C 5.8 (H) 08/11/2019   HGBA1C 5.5 06/30/2018   HGBA1C 5.7 (H)  02/08/2018   Lab Results  Component Value Date   INSULIN 13.4 04/25/2020   INSULIN 9.2 11/17/2019   INSULIN 14.4 08/11/2019   INSULIN 4.8 06/30/2018   INSULIN 5.8 02/08/2018   Lab Results  Component Value Date   TSH 3.710 08/11/2019   Lab Results  Component Value Date   CHOL 157 04/25/2020   HDL 67 04/25/2020   LDLCALC 74 04/25/2020   TRIG 88 04/25/2020   CHOLHDL 2.2 02/08/2018   Lab Results  Component Value Date   WBC 11.2 (H) 08/24/2018   HGB 12.9 08/24/2018   HCT 40.3 08/24/2018   MCV 84.7 08/24/2018    PLT 471 (H) 08/24/2018   No results found for: IRON, TIBC, FERRITIN  Obesity Behavioral Intervention Documentation for Insurance:   Approximately 15 minutes were spent on the discussion below.  ASK: We discussed the diagnosis of obesity with [name] today and [name] agreed to give [place] permission to discuss obesity behavioral modification therapy today.  ASSESS: [name] has the diagnosis of obesity and her BMI today is 59.25. [name] is in the action stage of change.   ADVISE: [name] was educated on the multiple health risks of obesity as well as the benefit of weight loss to improve her health. She was advised of the need for long term treatment and the importance of lifestyle modifications to improve her current health and to decrease her risk of future health problems.  AGREE: Multiple dietary modification options and treatment options were discussed and [name] agreed to follow the recommendations documented in the above note.  ARRANGE: [name] was educated on the importance of frequent visits to treat obesity as outlined per CMS and USPSTF guidelines and agreed to schedule her next follow up appointment today.  Attestation Statements:   Reviewed by clinician on day of visit: allergies, medications, problem list, medical history, surgical history, family history, social history, and previous encounter notes.   Trude Mcburney, am acting as Energy manager for [name], FNP-C.  I have reviewed the above documentation for accuracy and completeness, and I agree with the above. -  Jesse Sans, FNP

## 2020-06-20 ENCOUNTER — Other Ambulatory Visit: Payer: Self-pay

## 2020-06-20 ENCOUNTER — Ambulatory Visit (INDEPENDENT_AMBULATORY_CARE_PROVIDER_SITE_OTHER): Payer: Medicare Other | Admitting: Family Medicine

## 2020-06-20 ENCOUNTER — Encounter (INDEPENDENT_AMBULATORY_CARE_PROVIDER_SITE_OTHER): Payer: Self-pay | Admitting: Family Medicine

## 2020-06-20 ENCOUNTER — Other Ambulatory Visit (INDEPENDENT_AMBULATORY_CARE_PROVIDER_SITE_OTHER): Payer: Self-pay | Admitting: Family Medicine

## 2020-06-20 VITALS — BP 121/83 | HR 73 | Temp 97.6°F | Ht 62.0 in | Wt 327.0 lb

## 2020-06-20 DIAGNOSIS — F3289 Other specified depressive episodes: Secondary | ICD-10-CM

## 2020-06-20 DIAGNOSIS — E559 Vitamin D deficiency, unspecified: Secondary | ICD-10-CM

## 2020-06-20 DIAGNOSIS — Z6841 Body Mass Index (BMI) 40.0 and over, adult: Secondary | ICD-10-CM | POA: Diagnosis not present

## 2020-06-20 MED ORDER — VITAMIN D (ERGOCALCIFEROL) 1.25 MG (50000 UNIT) PO CAPS: 50000.0000 [IU] | ORAL_CAPSULE | ORAL | 0 refills | Status: DC

## 2020-06-20 MED ORDER — BUPROPION HCL ER (SR) 200 MG PO TB12: 200.0000 mg | ORAL_TABLET | Freq: Every day | ORAL | 0 refills | Status: DC

## 2020-06-20 NOTE — Progress Notes (Signed)
Chief Complaint:   OBESITY [name] is here to discuss her progress with her obesity treatment plan along with follow-up of her obesity related diagnoses. [name] is on the Category 3 Plan and states she is following her eating plan approximately 50-55% of the time. [name] states she is doing 0 minutes 0 times per week.  Today's visit was #: 29 Starting weight: 296 lbs Starting date: 02/08/2018 Today's weight: 327 lbs Today's date: 06/20/2020 Total lbs lost to date: 0 Total lbs lost since last in-office visit: 0  Interim History: Brandyn was on vacation in [place] and she did not follow the plan. She is having a difficult time transitioning to the Category 3 plan from journaling. She has been skipping breakfast or having Protein2O beverage (70 calories 115 grams of protein). She is also skipping lunch at times. She asked my thoughts on getting a scooter.  Subjective:   1. Vitamin D deficiency [name]'s Vit D level is at goal. She is on weekly prescription Vit D.  2. Other depression, with emotional eating [name] notes bupropion helps a great deal with appetite. However, she tends to skip meals.  Assessment/Plan:   1. Vitamin D deficiency Low Vitamin D level contributes to fatigue and are associated with obesity, breast, and colon cancer. We will refill prescription Vitamin D for 1 month. [name] will follow-up for routine testing of Vitamin D, at least 2-3 times per year to avoid over-replacement.  - Vitamin D, Ergocalciferol, (DRISDOL) 1.25 MG (50000 UNIT) CAPS capsule; Take 1 capsule (50,000 Units total) by mouth every 7 (seven) days.  Dispense: 4 capsule; Refill: 0  2. Other depression, with emotional eating Behavior modification techniques were discussed today to help [name] deal with her emotional/non-hunger eating behaviors. We will refill bupropion for 1 month. Orders and follow up as documented in patient record.   - buPROPion (WELLBUTRIN SR) 200 MG 12 hr tablet; Take 1  tablet (200 mg total) by mouth daily.  Dispense: 30 tablet; Refill: 0  3. Class 3 severe obesity with serious comorbidity and body mass index (BMI) of 50.0 to 59.9 in adult, unspecified obesity type [facility]) [name] is currently in the action stage of change. As such, her goal is to continue with weight loss efforts. She has agreed to the Category 3 Plan.   [name] must increase food intake in general. We discussed the importance of regular meals and protein intake for weight loss and overall health.  Exercise goals: We discussed joining [employer], as she gets a Field seismologist. She will ask per primary care physician about restarting physical therapy. I told her that I fel that getting a scooter would be detrimental and decrease her mobility long-term. As long as she can walk on her own she needs to!  Behavioral modification strategies: increasing lean protein intake and no skipping meals.  [name] has agreed to follow-up with our clinic in 3 weeks. She was informed of the importance of frequent follow-up visits to maximize her success with intensive lifestyle modifications for her multiple health conditions.   Objective:   Blood pressure 121/83, pulse 73, temperature 97.6 F (36.4 C), temperature source Oral, height 5\' 2"  (1.575 m), weight (!) 327 lb (148.3 kg), SpO2 98 %. Body mass index is 59.81 kg/m.  [name] uses a cane for ambulation. General: Cooperative, alert, well developed, in no acute distress. HEENT: Conjunctivae and lids unremarkable. Cardiovascular: Regular rhythm.  Lungs: Normal work of breathing. Neurologic: No focal deficits.  MSK: Uses cane for ambulation.  Lab Results  Component Value Date   CREATININE 1.15 (H) 04/25/2020   BUN 24 04/25/2020   NA 139 04/25/2020   K 4.1 04/25/2020   CL 103 04/25/2020   CO2 18 (L) 04/25/2020   Lab Results  Component Value Date   ALT 9 04/25/2020   AST 12 04/25/2020   ALKPHOS 60 04/25/2020   BILITOT 0.3 04/25/2020   Lab Results    Component Value Date   HGBA1C 5.5 04/25/2020   HGBA1C 5.6 11/17/2019   HGBA1C 5.8 (H) 08/11/2019   HGBA1C 5.5 06/30/2018   HGBA1C 5.7 (H) 02/08/2018   Lab Results  Component Value Date   INSULIN 13.4 04/25/2020   INSULIN 9.2 11/17/2019   INSULIN 14.4 08/11/2019   INSULIN 4.8 06/30/2018   INSULIN 5.8 02/08/2018   Lab Results  Component Value Date   TSH 3.710 08/11/2019   Lab Results  Component Value Date   CHOL 157 04/25/2020   HDL 67 04/25/2020   LDLCALC 74 04/25/2020   TRIG 88 04/25/2020   CHOLHDL 2.2 02/08/2018   Lab Results  Component Value Date   WBC 11.2 (H) 08/24/2018   HGB 12.9 08/24/2018   HCT 40.3 08/24/2018   MCV 84.7 08/24/2018   PLT 471 (H) 08/24/2018   No results found for: IRON, TIBC, FERRITIN  Obesity Behavioral Intervention Documentation for Insurance:   Approximately 15 minutes were spent on the discussion below.  ASK: We discussed the diagnosis of obesity with [name] today and [name] agreed to give [place] permission to discuss obesity behavioral modification therapy today.  ASSESS: [name] has the diagnosis of obesity and her BMI today is 59.79. [name] is in the action stage of change.   ADVISE: [name] was educated on the multiple health risks of obesity as well as the benefit of weight loss to improve her health. She was advised of the need for long term treatment and the importance of lifestyle modifications to improve her current health and to decrease her risk of future health problems.  AGREE: Multiple dietary modification options and treatment options were discussed and [name] agreed to follow the recommendations documented in the above note.  ARRANGE: [name] was educated on the importance of frequent visits to treat obesity as outlined per CMS and USPSTF guidelines and agreed to schedule her next follow up appointment today.  Attestation Statements:   Reviewed by clinician on day of visit: allergies, medications, problem list, medical  history, surgical history, family history, social history, and previous encounter notes.   Trude Mcburney, am acting as Energy manager for [name], FNP-C.  I have reviewed the above documentation for accuracy and completeness, and I agree with the above. -  Jesse Sans, FNP '

## 2020-06-26 ENCOUNTER — Other Ambulatory Visit (INDEPENDENT_AMBULATORY_CARE_PROVIDER_SITE_OTHER): Payer: Self-pay

## 2020-06-26 ENCOUNTER — Encounter (INDEPENDENT_AMBULATORY_CARE_PROVIDER_SITE_OTHER): Payer: Self-pay | Admitting: Family Medicine

## 2020-06-26 ENCOUNTER — Other Ambulatory Visit (INDEPENDENT_AMBULATORY_CARE_PROVIDER_SITE_OTHER): Payer: Self-pay | Admitting: Family Medicine

## 2020-06-26 DIAGNOSIS — F3289 Other specified depressive episodes: Secondary | ICD-10-CM

## 2020-06-26 DIAGNOSIS — E8881 Metabolic syndrome: Secondary | ICD-10-CM

## 2020-06-26 MED ORDER — BD PEN NEEDLE NANO 2ND GEN 32G X 4 MM MISC: 1.0000 | 0 refills | Status: AC

## 2020-07-09 ENCOUNTER — Encounter (INDEPENDENT_AMBULATORY_CARE_PROVIDER_SITE_OTHER): Payer: Self-pay | Admitting: Family Medicine

## 2020-07-09 ENCOUNTER — Other Ambulatory Visit: Payer: Self-pay

## 2020-07-09 ENCOUNTER — Ambulatory Visit (INDEPENDENT_AMBULATORY_CARE_PROVIDER_SITE_OTHER): Payer: Medicare Other | Admitting: Family Medicine

## 2020-07-09 VITALS — BP 140/87 | HR 68 | Temp 97.7°F | Ht 62.0 in | Wt 325.0 lb

## 2020-07-09 DIAGNOSIS — E88819 Insulin resistance, unspecified: Secondary | ICD-10-CM

## 2020-07-09 DIAGNOSIS — I10 Essential (primary) hypertension: Secondary | ICD-10-CM

## 2020-07-09 DIAGNOSIS — Z6841 Body Mass Index (BMI) 40.0 and over, adult: Secondary | ICD-10-CM | POA: Diagnosis not present

## 2020-07-09 DIAGNOSIS — F3289 Other specified depressive episodes: Secondary | ICD-10-CM

## 2020-07-09 DIAGNOSIS — E8881 Metabolic syndrome: Secondary | ICD-10-CM

## 2020-07-09 MED ORDER — OZEMPIC (0.25 OR 0.5 MG/DOSE) 2 MG/1.5ML ~~LOC~~ SOPN: 0.2500 mg | PEN_INJECTOR | SUBCUTANEOUS | 0 refills | Status: DC

## 2020-07-09 NOTE — Telephone Encounter (Signed)
fyi

## 2020-07-09 NOTE — Progress Notes (Signed)
Chief Complaint:   OBESITY [name] is here to discuss her progress with her obesity treatment plan along with follow-up of her obesity related diagnoses. [name] is on the Category 3 Plan and states she is following her eating plan approximately 70% of the time. [name] states she is doing 0 minutes 0 times per week.  Today's visit was #: 30 Starting weight: 296 lbs Starting date: 02/08/2018 Today's weight: 325 lbs Today's date: 07/09/2020 Total lbs lost to date: 0 Total lbs lost since last in-office visit: 2  Interim History: [name] still skips breakfast at times. She is weighing her protein at lunch and getting in 4 oz, but not getting 8 oz of meat at supper if her husband cooks dinner.  Subjective:   1. Insulin resistance [name] has a diagnosis of insulin resistance based on her elevated fasting insulin level >5. She notes some diarrhea with 0.25 mg of Ozempic weekly. She denies polyphagia.   Lab Results  Component Value Date   INSULIN 13.4 04/25/2020   INSULIN 9.2 11/17/2019   INSULIN 14.4 08/11/2019   INSULIN 4.8 06/30/2018   INSULIN 5.8 02/08/2018   Lab Results  Component Value Date   HGBA1C 5.5 04/25/2020   2. Essential hypertension [name]'s blood pressure is borderline today, but usually well controlled. She is compliant with metoprolol and Maxzide. Cardiovascular ROS: no chest pain or dyspnea on exertion.  BP Readings from Last 3 Encounters:  07/09/20 140/87  06/20/20 121/83  06/05/20 126/86   Lab Results  Component Value Date   CREATININE 1.15 (H) 04/25/2020   CREATININE 0.83 11/17/2019   CREATININE 0.88 08/11/2019   3. Other depression, with emotional eating [name] does notes having issues that make her feel like giving up on weight loss. She has seen Dr. Dewaine Conger in the past, but her co-pays got to be too expensive because she was also going to PT at that time. .  Assessment/Plan:   1. Insulin resistance We will continue Ozempic 0.25 mg weekly for 1  month.  - Semaglutide,0.25 or 0.5MG /DOS, (OZEMPIC, 0.25 OR 0.5 MG/DOSE,) 2 MG/1.5ML SOPN; Inject 0.25 mg into the skin once a week.  Dispense: 1.5 mL; Refill: 0  2. Essential hypertension [name] will continue all her medications.  3. Other depression, with emotional eating . I will obtain counseling resources that were provided to her in the past by Dr. Dewaine Conger. She agrees that she would benefit from counseling,   4. Class 3 severe obesity with serious comorbidity and body mass index (BMI) of 50.0 to 59.9 in adult, unspecified obesity type [facility]) [name] is currently in the action stage of change. As such, her goal is to continue with weight loss efforts. She has agreed to the Category 3 Plan.   Exercise goals: increase activity throughout day.  Behavioral modification strategies: increasing lean protein intake and no skipping meals.  [name] has agreed to follow-up with our clinic in 2 weeks.  Objective:   Blood pressure 140/87, pulse 68, temperature 97.7 F (36.5 C), temperature source Oral, height 5\' 2"  (1.575 m), weight (!) 325 lb (147.4 kg), SpO2 99 %. Body mass index is 59.44 kg/m.  General: Cooperative, alert, well developed, in no acute distress. HEENT: Conjunctivae and lids unremarkable. Cardiovascular: Regular rhythm.  Lungs: Normal work of breathing. Neurologic: No focal deficits.   Lab Results  Component Value Date   CREATININE 1.15 (H) 04/25/2020   BUN 24 04/25/2020   NA 139 04/25/2020   K 4.1 04/25/2020   CL  103 04/25/2020   CO2 18 (L) 04/25/2020   Lab Results  Component Value Date   ALT 9 04/25/2020   AST 12 04/25/2020   ALKPHOS 60 04/25/2020   BILITOT 0.3 04/25/2020   Lab Results  Component Value Date   HGBA1C 5.5 04/25/2020   HGBA1C 5.6 11/17/2019   HGBA1C 5.8 (H) 08/11/2019   HGBA1C 5.5 06/30/2018   HGBA1C 5.7 (H) 02/08/2018   Lab Results  Component Value Date   INSULIN 13.4 04/25/2020   INSULIN 9.2 11/17/2019   INSULIN 14.4 08/11/2019    INSULIN 4.8 06/30/2018   INSULIN 5.8 02/08/2018   Lab Results  Component Value Date   TSH 3.710 08/11/2019   Lab Results  Component Value Date   CHOL 157 04/25/2020   HDL 67 04/25/2020   LDLCALC 74 04/25/2020   TRIG 88 04/25/2020   CHOLHDL 2.2 02/08/2018   Lab Results  Component Value Date   WBC 11.2 (H) 08/24/2018   HGB 12.9 08/24/2018   HCT 40.3 08/24/2018   MCV 84.7 08/24/2018   PLT 471 (H) 08/24/2018   No results found for: IRON, TIBC, FERRITIN  Obesity Behavioral Intervention:   Approximately 15 minutes were spent on the discussion below.  ASK: We discussed the diagnosis of obesity with [name] today and [name] agreed to give [place] permission to discuss obesity behavioral modification therapy today.  ASSESS: [name] has the diagnosis of obesity and her BMI today is 59.43. [name] is in the action stage of change.   ADVISE: [name] was educated on the multiple health risks of obesity as well as the benefit of weight loss to improve her health. She was advised of the need for long term treatment and the importance of lifestyle modifications to improve her current health and to decrease her risk of future health problems.  AGREE: Multiple dietary modification options and treatment options were discussed and [name] agreed to follow the recommendations documented in the above note.  ARRANGE: [name] was educated on the importance of frequent visits to treat obesity as outlined per CMS and USPSTF guidelines and agreed to schedule her next follow up appointment today.  Attestation Statements:   Reviewed by clinician on day of visit: allergies, medications, problem list, medical history, surgical history, family history, social history, and previous encounter notes.   Trude Mcburney, am acting as Energy manager for [name], FNP-C.  I have reviewed the above documentation for accuracy and completeness, and I agree with the above. -  Jesse Sans, FNP

## 2020-07-24 ENCOUNTER — Other Ambulatory Visit (INDEPENDENT_AMBULATORY_CARE_PROVIDER_SITE_OTHER): Payer: Self-pay | Admitting: Family Medicine

## 2020-07-24 DIAGNOSIS — E559 Vitamin D deficiency, unspecified: Secondary | ICD-10-CM

## 2020-07-24 DIAGNOSIS — F3289 Other specified depressive episodes: Secondary | ICD-10-CM

## 2020-07-25 ENCOUNTER — Ambulatory Visit (INDEPENDENT_AMBULATORY_CARE_PROVIDER_SITE_OTHER): Payer: Medicare Other | Admitting: Family Medicine

## 2020-07-25 ENCOUNTER — Encounter (INDEPENDENT_AMBULATORY_CARE_PROVIDER_SITE_OTHER): Payer: Self-pay | Admitting: Family Medicine

## 2020-07-25 ENCOUNTER — Other Ambulatory Visit (INDEPENDENT_AMBULATORY_CARE_PROVIDER_SITE_OTHER): Payer: Self-pay | Admitting: Family Medicine

## 2020-07-25 ENCOUNTER — Other Ambulatory Visit: Payer: Self-pay

## 2020-07-25 VITALS — BP 122/85 | HR 75 | Temp 97.9°F | Ht 62.0 in | Wt 323.0 lb

## 2020-07-25 DIAGNOSIS — Z6841 Body Mass Index (BMI) 40.0 and over, adult: Secondary | ICD-10-CM | POA: Diagnosis not present

## 2020-07-25 DIAGNOSIS — F3289 Other specified depressive episodes: Secondary | ICD-10-CM | POA: Diagnosis not present

## 2020-07-25 DIAGNOSIS — E559 Vitamin D deficiency, unspecified: Secondary | ICD-10-CM

## 2020-07-25 MED ORDER — BUPROPION HCL ER (SR) 200 MG PO TB12: 200.0000 mg | ORAL_TABLET | Freq: Every day | ORAL | 0 refills | Status: DC

## 2020-07-25 MED ORDER — VITAMIN D (ERGOCALCIFEROL) 1.25 MG (50000 UNIT) PO CAPS: 50000.0000 [IU] | ORAL_CAPSULE | ORAL | 0 refills | Status: DC

## 2020-07-25 NOTE — Progress Notes (Addendum)
Chief Complaint:   OBESITY [name] is here to discuss her progress with her obesity treatment plan along with follow-up of her obesity related diagnoses. [name] is on the Category 3 Plan and states she is following her eating plan approximately 60% of the time. [name] states she is doing 0 minutes 0 times per week.  Today's visit was #: 31 Starting weight: 296 lbs Starting date: 02/08/2018 Today's weight: 323 lbs Today's date: 07/25/2020 Total lbs lost to date: 0 Total lbs lost since last in-office visit: 2  Interim History: [name] has been having a [place] yogurt rather than skipping breakfast. She reports that following Category 3 plan is not working for her and she wants to go back to journaling. She has averaged between 1300-1400 calories and 60-70 grams of protein daily. Her family is still getting the groceries. [name] is afraid to go shopping because she is afraid she will need to sit down but will not be able to. Her lack of activity does impede her weight loss.  Subjective:   1. Vitamin D deficiency [name]'s last Vit D level was at goal at 56. She is on weekly prescription Vit D.  2. Other depression, with emotional eating Parish notes she feels a bit depressed at times. She is on Cymbalta and bupropion. She is considering seeing a counselor but her granddaughter has been ill and [name] has been putting energy into that.  Assessment/Plan:   1. Vitamin D deficiency Borghild agreed to continue taking prescription Vitamin D 50,000 IU every week and will follow-up for routine testing of Vitamin D, at least 2-3 times per year to avoid over-replacement.  - Vitamin D, Ergocalciferol, (DRISDOL) 1.25 MG (50000 UNIT) CAPS capsule; Take 1 capsule (50,000 Units total) by mouth every 7 (seven) days.  Dispense: 4 capsule; Refill: 0  2. Other depression, with emotional eating  [name] is to schedule an appointment with a counselor when she can. We will refill bupropion for 1 month. .   -  buPROPion (WELLBUTRIN SR) 200 MG 12 hr tablet; Take 1 tablet (200 mg total) by mouth daily.  Dispense: 30 tablet; Refill: 0  3. Class 3 severe obesity with serious comorbidity and body mass index (BMI) of 50.0 to 59.9 in adult, unspecified obesity type [facility]) [name] is currently in the action stage of change. As such, her goal is to continue with weight loss efforts. She has agreed to keeping a food journal and adhering to recommended goals of 1400-1500 calories and 80-90 grams of protein daily.   [name] will journal daily. Encouraged her to shop for her own groceries, starting with a short trip for a few items.  Exercise goals: Increase activity as much as possible.  Behavioral modification strategies: increasing lean protein intake and keeping a strict food journal.  [name] has agreed to follow-up with our clinic in 2 weeks.   Objective:   Blood pressure 122/85, pulse 75, temperature 97.9 F (36.6 C), temperature source Oral, height 5\' 2"  (1.575 m), weight (!) 323 lb (146.5 kg), SpO2 98 %. Body mass index is 59.08 kg/m.  General: Cooperative, alert, well developed, in no acute distress. HEENT: Conjunctivae and lids unremarkable. Cardiovascular: Regular rhythm.  Lungs: Normal work of breathing. Neurologic: No focal deficits.   Lab Results  Component Value Date   CREATININE 1.15 (H) 04/25/2020   BUN 24 04/25/2020   NA 139 04/25/2020   K 4.1 04/25/2020   CL 103 04/25/2020   CO2 18 (L) 04/25/2020   Lab  Results  Component Value Date   ALT 9 04/25/2020   AST 12 04/25/2020   ALKPHOS 60 04/25/2020   BILITOT 0.3 04/25/2020   Lab Results  Component Value Date   HGBA1C 5.5 04/25/2020   HGBA1C 5.6 11/17/2019   HGBA1C 5.8 (H) 08/11/2019   HGBA1C 5.5 06/30/2018   HGBA1C 5.7 (H) 02/08/2018   Lab Results  Component Value Date   INSULIN 13.4 04/25/2020   INSULIN 9.2 11/17/2019   INSULIN 14.4 08/11/2019   INSULIN 4.8 06/30/2018   INSULIN 5.8 02/08/2018   Lab Results    Component Value Date   TSH 3.710 08/11/2019   Lab Results  Component Value Date   CHOL 157 04/25/2020   HDL 67 04/25/2020   LDLCALC 74 04/25/2020   TRIG 88 04/25/2020   CHOLHDL 2.2 02/08/2018   Lab Results  Component Value Date   WBC 11.2 (H) 08/24/2018   HGB 12.9 08/24/2018   HCT 40.3 08/24/2018   MCV 84.7 08/24/2018   PLT 471 (H) 08/24/2018   No results found for: IRON, TIBC, FERRITIN  Obesity Behavioral Intervention:   Approximately 15 minutes were spent on the discussion below.  ASK: We discussed the diagnosis of obesity with Rinaldo Cloud today and Nyazia agreed to give [place] permission to discuss obesity behavioral modification therapy today.  ASSESS: [name] has the diagnosis of obesity and her BMI today is 59.06. [name] is in the action stage of change.   ADVISE: [name] was educated on the multiple health risks of obesity as well as the benefit of weight loss to improve her health. She was advised of the need for long term treatment and the importance of lifestyle modifications to improve her current health and to decrease her risk of future health problems.  AGREE: Multiple dietary modification options and treatment options were discussed and [name] agreed to follow the recommendations documented in the above note.  ARRANGE: [name] was educated on the importance of frequent visits to treat obesity as outlined per CMS and USPSTF guidelines and agreed to schedule her next follow up appointment today.  Attestation Statements:   Reviewed by clinician on day of visit: allergies, medications, problem list, medical history, surgical history, family history, social history, and previous encounter notes.   Trude Mcburney, am acting as Energy manager for [name], FNP-C.  I have reviewed the above documentation for accuracy and completeness, and I agree with the above. -  Jesse Sans, FNP

## 2020-08-08 ENCOUNTER — Ambulatory Visit (INDEPENDENT_AMBULATORY_CARE_PROVIDER_SITE_OTHER): Payer: Medicare Other | Admitting: Family Medicine

## 2020-08-08 ENCOUNTER — Encounter (INDEPENDENT_AMBULATORY_CARE_PROVIDER_SITE_OTHER): Payer: Self-pay | Admitting: Family Medicine

## 2020-08-08 ENCOUNTER — Other Ambulatory Visit: Payer: Self-pay

## 2020-08-08 VITALS — BP 123/88 | HR 75 | Ht 62.0 in | Wt 319.0 lb

## 2020-08-08 DIAGNOSIS — Z6841 Body Mass Index (BMI) 40.0 and over, adult: Secondary | ICD-10-CM

## 2020-08-08 DIAGNOSIS — F3289 Other specified depressive episodes: Secondary | ICD-10-CM | POA: Diagnosis not present

## 2020-08-09 ENCOUNTER — Encounter (INDEPENDENT_AMBULATORY_CARE_PROVIDER_SITE_OTHER): Payer: Self-pay | Admitting: Family Medicine

## 2020-08-09 NOTE — Progress Notes (Signed)
Chief Complaint:   OBESITY [name] is here to discuss her progress with her obesity treatment plan along with follow-up of her obesity related diagnoses. [name] is keeping a food journal and adhering to recommended goals of 1400-1500 calories and 80 grams of protein and states she is following her eating plan approximately 70% of the time. [name] states she is exercising 0 minutes 0 times per week.  Today's visit was #: 32 Starting weight: 296 lbs Starting date: 02/08/2018 Today's weight: 319 lbs  Today's date: 08/08/2020 Total lbs lost to date: 0 Total lbs lost since last in-office visit: 4  Interim History: [name] feels she is getting in more protein. She is journaling 75% of the time and getting in 80 grams of protein of protein daily. She is skipping less meals. She has not yet gone on a short shopping trip alone as discussed at last OV. Her fear of being unable to stand as long as needed prevents her from doing activities that she used to do and also prevents her from getting in an adequate amount of physical activity.  Ozempic is controlling her hunger well.  Subjective:   Other depression, with emotional eating. [name] has not established with a counselor yet. She reports financial issues do prevent her from doing PT or counseling. She notes stress eating is well controlled. I encouraged her to take a short shopping trip on her own.   Assessment/Plan:   Other depression, with emotional eating. Behavior modification techniques were discussed today to help [name] deal with her emotional/non-hunger eating behaviors.  She wonders if a there is another Medicare plan that may be better for her and cover the services she needs.  I provided her resource to research Medicare plans [employer].  Class 3 severe obesity with serious comorbidity and body mass index (BMI) of 50.0 to 59.9 in adult, unspecified obesity type (HCC).  [name] is currently in the action stage of  change. As such, her goal is to continue with weight loss efforts. She has agreed to keeping a food journal and adhering to recommended goals of 1400-1500 calories and 80-90 grams of protein daily.   She will check into gym benefits through [facility].  Exercise goals: All adults should avoid inactivity. Some physical activity is better than none, and adults who participate in any amount of physical activity gain some health benefits.  Behavioral modification strategies: increasing lean protein intake and keeping a strict food journal.  [name] has agreed to follow-up with our clinic in 3 weeks.  Objective:   Blood pressure 123/88, pulse 75, height 5\' 2"  (1.575 m), weight (!) 319 lb (144.7 kg), SpO2 98 %. Body mass index is 58.35 kg/m.  General: Cooperative, alert, well developed, in no acute distress. HEENT: Conjunctivae and lids unremarkable. Cardiovascular: Regular rhythm.  Lungs: Normal work of breathing. Neurologic: No focal deficits.   Lab Results  Component Value Date   CREATININE 1.15 (H) 04/25/2020   BUN 24 04/25/2020   NA 139 04/25/2020   K 4.1 04/25/2020   CL 103 04/25/2020   CO2 18 (L) 04/25/2020   Lab Results  Component Value Date   ALT 9 04/25/2020   AST 12 04/25/2020   ALKPHOS 60 04/25/2020   BILITOT 0.3 04/25/2020   Lab Results  Component Value Date   HGBA1C 5.5 04/25/2020   HGBA1C 5.6 11/17/2019   HGBA1C 5.8 (H) 08/11/2019   HGBA1C 5.5 06/30/2018   HGBA1C 5.7 (H) 02/08/2018   Lab Results  Component Value Date   INSULIN 13.4 04/25/2020   INSULIN 9.2 11/17/2019   INSULIN 14.4 08/11/2019   INSULIN 4.8 06/30/2018   INSULIN 5.8 02/08/2018   Lab Results  Component Value Date   TSH 3.710 08/11/2019   Lab Results  Component Value Date   CHOL 157 04/25/2020   HDL 67 04/25/2020   LDLCALC 74 04/25/2020   TRIG 88 04/25/2020   CHOLHDL 2.2 02/08/2018   Lab Results  Component Value Date   WBC 11.2 (H) 08/24/2018   HGB 12.9 08/24/2018   HCT 40.3  08/24/2018   MCV 84.7 08/24/2018   PLT 471 (H) 08/24/2018   No results found for: IRON, TIBC, FERRITIN  Obesity Behavioral Intervention:   Approximately 15 minutes were spent on the discussion below.  ASK: We discussed the diagnosis of obesity with [name] today and [name] agreed to give [place] permission to discuss obesity behavioral modification therapy today.  ASSESS: [name] has the diagnosis of obesity and her BMI today is 58.5. [name] is in the action stage of change.   ADVISE: [name] was educated on the multiple health risks of obesity as well as the benefit of weight loss to improve her health. She was advised of the need for long term treatment and the importance of lifestyle modifications to improve her current health and to decrease her risk of future health problems.  AGREE: Multiple dietary modification options and treatment options were discussed and [name] agreed to follow the recommendations documented in the above note.  ARRANGE: [name] was educated on the importance of frequent visits to treat obesity as outlined per CMS and USPSTF guidelines and agreed to schedule her next follow up appointment today.  Attestation Statements:   Reviewed by clinician on day of visit: allergies, medications, problem list, medical history, surgical history, family history, social history, and previous encounter notes.  [name], am acting as [name] for [name], FNP-C   I have reviewed the above documentation for accuracy and completeness, and I agree with the above. -  Abigail Sans, FNP

## 2020-08-15 LAB — COMPREHENSIVE METABOLIC PANEL
Albumin: 4.1 (ref 3.5–5.0)
Calcium: 9.8 (ref 8.7–10.7)
GFR calc Af Amer: 66
GFR calc non Af Amer: 55

## 2020-08-15 LAB — HEPATIC FUNCTION PANEL
ALT: 10 (ref 7–35)
AST: 14 (ref 13–35)
Alkaline Phosphatase: 54 (ref 25–125)
Bilirubin, Total: 0.5

## 2020-08-15 LAB — HEMOGLOBIN A1C: Hemoglobin A1C: 5.7

## 2020-08-15 LAB — BASIC METABOLIC PANEL
BUN: 27 — AB (ref 4–21)
CO2: 27 — AB (ref 13–22)
Chloride: 103 (ref 99–108)
Creatinine: 1 (ref 0.5–1.1)
Glucose: 68
Potassium: 4.7 (ref 3.4–5.3)
Sodium: 140 (ref 137–147)

## 2020-08-15 LAB — CBC AND DIFFERENTIAL
HCT: 41 (ref 36–46)
Hemoglobin: 12.9 (ref 12.0–16.0)
Neutrophils Absolute: 4.5
WBC: 7.4

## 2020-08-15 LAB — HM HEPATITIS C SCREENING LAB: HM Hepatitis Screen: NEGATIVE

## 2020-08-15 LAB — CBC: RBC: 5.61 — AB (ref 3.87–5.11)

## 2020-08-15 LAB — LIPID PANEL
Cholesterol: 166 (ref 0–200)
HDL: 60 (ref 35–70)
LDL Cholesterol: 90
LDl/HDL Ratio: 2.8
Triglycerides: 86 (ref 40–160)

## 2020-08-15 LAB — MICROALBUMIN, URINE: Microalb, Ur: 0.7

## 2020-08-15 LAB — TSH: TSH: 4.5 (ref 0.41–5.90)

## 2020-08-22 ENCOUNTER — Ambulatory Visit (INDEPENDENT_AMBULATORY_CARE_PROVIDER_SITE_OTHER): Payer: Medicare Other | Admitting: Family Medicine

## 2020-08-29 ENCOUNTER — Other Ambulatory Visit: Payer: Self-pay

## 2020-08-29 ENCOUNTER — Ambulatory Visit (INDEPENDENT_AMBULATORY_CARE_PROVIDER_SITE_OTHER): Payer: Medicare Other | Admitting: Family Medicine

## 2020-08-29 ENCOUNTER — Encounter (INDEPENDENT_AMBULATORY_CARE_PROVIDER_SITE_OTHER): Payer: Self-pay | Admitting: Family Medicine

## 2020-08-29 ENCOUNTER — Other Ambulatory Visit (INDEPENDENT_AMBULATORY_CARE_PROVIDER_SITE_OTHER): Payer: Self-pay | Admitting: Family Medicine

## 2020-08-29 VITALS — BP 124/83 | HR 62 | Temp 97.8°F | Ht 62.0 in | Wt 324.0 lb

## 2020-08-29 DIAGNOSIS — F3289 Other specified depressive episodes: Secondary | ICD-10-CM | POA: Diagnosis not present

## 2020-08-29 DIAGNOSIS — E559 Vitamin D deficiency, unspecified: Secondary | ICD-10-CM | POA: Diagnosis not present

## 2020-08-29 DIAGNOSIS — M25561 Pain in right knee: Secondary | ICD-10-CM | POA: Diagnosis not present

## 2020-08-29 DIAGNOSIS — Z6841 Body Mass Index (BMI) 40.0 and over, adult: Secondary | ICD-10-CM

## 2020-08-29 MED ORDER — VITAMIN D (ERGOCALCIFEROL) 1.25 MG (50000 UNIT) PO CAPS: 50000.0000 [IU] | ORAL_CAPSULE | ORAL | 0 refills | Status: DC

## 2020-08-29 MED ORDER — BUPROPION HCL ER (SR) 200 MG PO TB12: 200.0000 mg | ORAL_TABLET | Freq: Every day | ORAL | 0 refills | Status: DC

## 2020-08-29 NOTE — Progress Notes (Signed)
Chief Complaint:   OBESITY Kristene is here to discuss her progress with her obesity treatment plan along with follow-up of her obesity related diagnoses. Marialy is on keeping a food journal and adhering to recommended goals of 1400-1500 calories and 80-90 grams of protein daily and states she is following her eating plan approximately 55% of the time. Gionna states she is moving more, and walking for 30 minutes 7 times per week.  Today's visit was #: 33 Starting weight: 296 lbs Starting date: 02/08/2018 Today's weight: 324 lbs Today's date: 08/29/2020 Total lbs lost to date: 0 Total lbs lost since last in-office visit: 0  Interim History: Sanam had a wonderful time on a week long trip with her brother and sister. She did eat out quite a bit and ate a lot of "comfort food", and she did not journal.  She has started going out more and is more active at home overall. She is starting to overcome her fear of being unable to stand as long as needed. In the past this fear has prevented her from doing activities that she used to do and from getting in an adequate amount of physical activity.   Subjective:   1. Vitamin D deficiency Minnah's Vit D level is at goal, and she is on weekly prescription Vit D.  2. Right knee pain, unspecified chronicity Kenedy notes increasing pain in her right knee. She does not want her right knee pain to limit her activity now that she is moving more.  3. Other depression, with emotional eating Bettyjane's primary care physician has recommended counseling have I. . She has ventured out more and is feeling better about herself. Her mood is stable. She is on bupropion and Cymbalta.  Assessment/Plan:   1. Vitamin D deficiency We will refill prescription Vitamin D for 1 month.   - Vitamin D, Ergocalciferol, (DRISDOL) 1.25 MG (50000 UNIT) CAPS capsule; Take 1 capsule (50,000 Units total) by mouth every 7 (seven) days.  Dispense: 4 capsule; Refill: 0  2. Right  knee pain, unspecified chronicity Melisse will call her primary care physician for ortho referral or make an appointment with her orthopedic provider in [facility] to have her knee evaluated.   3. Other depression, with emotional eating  We will refill bupropion for 1 month. Lillyona will continue Cymbalta. She will follow up with the referral placed by Dr. Cliffton Asters and set up counseling with [facility] counseling  - buPROPion [facility] SR) 200 MG 12 hr tablet; Take 1 tablet (200 mg total) by mouth daily.  Dispense: 30 tablet; Refill: 0  4. Class 3 severe obesity with serious comorbidity and body mass index (BMI) of 50.0 to 59.9 in adult, unspecified obesity type [facility]) Brezlyn is currently in the action stage of change. As such, her goal is to continue with weight loss efforts. She has agreed to keeping a food journal and adhering to recommended goals of 1400-1500 calories and 80-90 grams of protein daily.   Reet will start journaling again today.  Exercise goals: Continue to increase activity.  Behavioral modification strategies: increasing lean protein intake, decreasing simple carbohydrates and keeping a strict food journal.  Zitlali has agreed to follow-up with our clinic in 2 to 3 weeks.   Objective:   Blood pressure 124/83, pulse 62, temperature 97.8 F (36.6 C), height 5\' 2"  (1.575 m), weight (!) 324 lb (147 kg), SpO2 100 %. Body mass index is 59.26 kg/m.  General: Cooperative, alert, well developed, in no acute distress.  HEENT: Conjunctivae and lids unremarkable. Cardiovascular: Regular rhythm.  Lungs: Normal work of breathing. Neurologic: No focal deficits.   Lab Results  Component Value Date   CREATININE 1.15 (H) 04/25/2020   BUN 24 04/25/2020   NA 139 04/25/2020   K 4.1 04/25/2020   CL 103 04/25/2020   CO2 18 (L) 04/25/2020   Lab Results  Component Value Date   ALT 9 04/25/2020   AST 12 04/25/2020   ALKPHOS 60 04/25/2020   BILITOT 0.3 04/25/2020   Lab Results   Component Value Date   HGBA1C 5.5 04/25/2020   HGBA1C 5.6 11/17/2019   HGBA1C 5.8 (H) 08/11/2019   HGBA1C 5.5 06/30/2018   HGBA1C 5.7 (H) 02/08/2018   Lab Results  Component Value Date   INSULIN 13.4 04/25/2020   INSULIN 9.2 11/17/2019   INSULIN 14.4 08/11/2019   INSULIN 4.8 06/30/2018   INSULIN 5.8 02/08/2018   Lab Results  Component Value Date   TSH 3.710 08/11/2019   Lab Results  Component Value Date   CHOL 157 04/25/2020   HDL 67 04/25/2020   LDLCALC 74 04/25/2020   TRIG 88 04/25/2020   CHOLHDL 2.2 02/08/2018   Lab Results  Component Value Date   WBC 11.2 (H) 08/24/2018   HGB 12.9 08/24/2018   HCT 40.3 08/24/2018   MCV 84.7 08/24/2018   PLT 471 (H) 08/24/2018   No results found for: IRON, TIBC, FERRITIN  Obesity Behavioral Intervention:   Approximately 15 minutes were spent on the discussion below.  ASK: We discussed the diagnosis of obesity with Rinaldo Cloud today and Charlsie agreed to give [place] permission to discuss obesity behavioral modification therapy today.  ASSESS: Brinda has the diagnosis of obesity and her BMI today is 59.25. Rigby is in the action stage of change.   ADVISE: Amyre was educated on the multiple health risks of obesity as well as the benefit of weight loss to improve her health. She was advised of the need for long term treatment and the importance of lifestyle modifications to improve her current health and to decrease her risk of future health problems.  AGREE: Multiple dietary modification options and treatment options were discussed and Gladys agreed to follow the recommendations documented in the above note.  ARRANGE: Sharonlee was educated on the importance of frequent visits to treat obesity as outlined per CMS and USPSTF guidelines and agreed to schedule her next follow up appointment today.  Attestation Statements:   Reviewed by clinician on day of visit: allergies, medications, problem list, medical history, surgical history,  family history, social history, and previous encounter notes.   Trude Mcburney, am acting as Energy manager for [name], FNP-C.  I have reviewed the above documentation for accuracy and completeness, and I agree with the above. -  Jesse Sans, FNP

## 2020-09-12 ENCOUNTER — Encounter (INDEPENDENT_AMBULATORY_CARE_PROVIDER_SITE_OTHER): Payer: Self-pay | Admitting: Family Medicine

## 2020-09-12 ENCOUNTER — Ambulatory Visit (INDEPENDENT_AMBULATORY_CARE_PROVIDER_SITE_OTHER): Payer: Medicare Other | Admitting: Family Medicine

## 2020-09-12 ENCOUNTER — Other Ambulatory Visit (INDEPENDENT_AMBULATORY_CARE_PROVIDER_SITE_OTHER): Payer: Self-pay | Admitting: Family Medicine

## 2020-09-12 ENCOUNTER — Other Ambulatory Visit: Payer: Self-pay

## 2020-09-12 VITALS — BP 133/76 | HR 76 | Temp 97.7°F | Ht 62.0 in | Wt 318.0 lb

## 2020-09-12 DIAGNOSIS — Z6841 Body Mass Index (BMI) 40.0 and over, adult: Secondary | ICD-10-CM | POA: Diagnosis not present

## 2020-09-12 DIAGNOSIS — F3289 Other specified depressive episodes: Secondary | ICD-10-CM

## 2020-09-12 DIAGNOSIS — R7303 Prediabetes: Secondary | ICD-10-CM | POA: Diagnosis not present

## 2020-09-12 MED ORDER — OZEMPIC (0.25 OR 0.5 MG/DOSE) 2 MG/1.5ML ~~LOC~~ SOPN: 0.2500 mg | PEN_INJECTOR | SUBCUTANEOUS | 0 refills | Status: DC

## 2020-09-13 NOTE — Progress Notes (Signed)
Chief Complaint:   OBESITY [name] is here to discuss her progress with her obesity treatment plan along with follow-up of her obesity related diagnoses. [name] is on keeping a food journal and adhering to recommended goals of 1400-1500 calories and 80-90 grams of protein daily and states she is following her eating plan approximately 60% of the time. [name] states she is doing 0 minutes 0 times per week.  Today's visit was #: 34 Starting weight: 296 lbs Starting date: 02/08/2018 Today's weight: 318 lbs Today's date: 09/12/2020 Total lbs lost to date: 0 Total lbs lost since last in-office visit: 6  Interim History: Kaylise is doing very well with the plan. She is journaling 4-5 days per week. She is meeting protein goals most of the time. She notes that she she likely does not eat enough on days she does not journal.  Subjective:   1. Pre-diabetes [name] is on Ozempic 0.25 mg and she notes her appetite is suppressed. She is in a donut hole and may not be able to afford it going forward until Jan 1.    Lab Results  Component Value Date   HGBA1C 5.7 08/15/2020   Lab Results  Component Value Date   INSULIN 13.4 04/25/2020   INSULIN 9.2 11/17/2019   INSULIN 14.4 08/11/2019   INSULIN 4.8 06/30/2018   INSULIN 5.8 02/08/2018   2. Other depression, with emotional eating [name] started counseling last week, and she is excited about her next office visit. Her mood is stable on bupropion and Cymbalta.  Assessment/Plan:   1. Pre-diabetes  We will refill Ozempic for 1 month.  - Semaglutide,0.25 or 0.5MG /DOS, (OZEMPIC, 0.25 OR 0.5 MG/DOSE,) 2 MG/1.5ML SOPN; Inject 0.25 mg into the skin once a week.  Dispense: 1.5 mL; Refill: 0  2. Other depression, with emotional eating Behavior modification techniques were discussed today to help [name] deal with her emotional/non-hunger eating behaviors. [name] will continue bupropion and Cymbalta.   3. Class 3 severe obesity with serious  comorbidity and body mass index (BMI) of 50.0 to 59.9 in adult, unspecified obesity type [facility]) [name] is currently in the action stage of change. As such, her goal is to continue with weight loss efforts. She has agreed to keeping a food journal and adhering to recommended goals of 1400-1500 calories and 80-90 grams of protein daily.   Handouts were given today: Thanksgiving Tips, and Recipes.  Exercise goals: No exercise has been prescribed at this time.  Behavioral modification strategies: increasing lean protein intake, holiday eating strategies  and keeping a strict food journal.  [name] has agreed to follow-up with our clinic in 3 weeks.   Objective:   Blood pressure 133/76, pulse 76, temperature 97.7 F (36.5 C), height 5\' 2"  (1.575 m), weight (!) 318 lb (144.2 kg), SpO2 97 %. Body mass index is 58.16 kg/m.  General: Cooperative, alert, well developed, in no acute distress. HEENT: Conjunctivae and lids unremarkable. Cardiovascular: Regular rhythm.  Lungs: Normal work of breathing. Neurologic: No focal deficits.   Lab Results  Component Value Date   CREATININE 1.0 08/15/2020   BUN 27 (A) 08/15/2020   NA 140 08/15/2020   K 4.7 08/15/2020   CL 103 08/15/2020   CO2 27 (A) 08/15/2020   Lab Results  Component Value Date   ALT 10 08/15/2020   AST 14 08/15/2020   ALKPHOS 54 08/15/2020   BILITOT 0.3 04/25/2020   Lab Results  Component Value Date   HGBA1C 5.7 08/15/2020  HGBA1C 5.5 04/25/2020   HGBA1C 5.6 11/17/2019   HGBA1C 5.8 (H) 08/11/2019   HGBA1C 5.5 06/30/2018   Lab Results  Component Value Date   INSULIN 13.4 04/25/2020   INSULIN 9.2 11/17/2019   INSULIN 14.4 08/11/2019   INSULIN 4.8 06/30/2018   INSULIN 5.8 02/08/2018   Lab Results  Component Value Date   TSH 4.50 08/15/2020   Lab Results  Component Value Date   CHOL 166 08/15/2020   HDL 60 08/15/2020   LDLCALC 90 08/15/2020   TRIG 86 08/15/2020   CHOLHDL 2.2 02/08/2018   Lab Results    Component Value Date   WBC 7.4 08/15/2020   HGB 12.9 08/15/2020   HCT 41 08/15/2020   MCV 84.7 08/24/2018   PLT 471 (H) 08/24/2018   No results found for: IRON, TIBC, FERRITIN  Obesity Behavioral Intervention:   Approximately 15 minutes were spent on the discussion below.  ASK: We discussed the diagnosis of obesity with [name] today and [name] agreed to give [place] permission to discuss obesity behavioral modification therapy today.  ASSESS: [name] has the diagnosis of obesity and her BMI today is 58.15. [name] is in the action stage of change.   ADVISE: [name] was educated on the multiple health risks of obesity as well as the benefit of weight loss to improve her health. She was advised of the need for long term treatment and the importance of lifestyle modifications to improve her current health and to decrease her risk of future health problems.  AGREE: Multiple dietary modification options and treatment options were discussed and [name] agreed to follow the recommendations documented in the above note.  ARRANGE: [name] was educated on the importance of frequent visits to treat obesity as outlined per CMS and USPSTF guidelines and agreed to schedule her next follow up appointment today.  Attestation Statements:   Reviewed by clinician on day of visit: allergies, medications, problem list, medical history, surgical history, family history, social history, and previous encounter notes.   Trude Mcburney, am acting as Energy manager for [name], FNP-C.  I have reviewed the above documentation for accuracy and completeness, and I agree with the above. -  Jesse Sans, FNP

## 2020-09-17 ENCOUNTER — Encounter (INDEPENDENT_AMBULATORY_CARE_PROVIDER_SITE_OTHER): Payer: Self-pay | Admitting: Family Medicine

## 2020-09-24 ENCOUNTER — Ambulatory Visit (INDEPENDENT_AMBULATORY_CARE_PROVIDER_SITE_OTHER): Payer: Medicare Other | Admitting: Family Medicine

## 2020-09-27 ENCOUNTER — Other Ambulatory Visit (INDEPENDENT_AMBULATORY_CARE_PROVIDER_SITE_OTHER): Payer: Self-pay | Admitting: Family Medicine

## 2020-09-27 DIAGNOSIS — E559 Vitamin D deficiency, unspecified: Secondary | ICD-10-CM

## 2020-09-27 NOTE — Telephone Encounter (Signed)
Would you like for me to refill her rx for vitamin. D?

## 2020-09-27 NOTE — Telephone Encounter (Signed)
This patient was last seen by Dawn Whitmire, FNP, and currently has an upcoming appt scheduled on 10/08/20 with her.  

## 2020-10-08 ENCOUNTER — Encounter (INDEPENDENT_AMBULATORY_CARE_PROVIDER_SITE_OTHER): Payer: Self-pay | Admitting: Family Medicine

## 2020-10-08 ENCOUNTER — Ambulatory Visit (INDEPENDENT_AMBULATORY_CARE_PROVIDER_SITE_OTHER): Payer: Medicare Other | Admitting: Family Medicine

## 2020-10-08 ENCOUNTER — Other Ambulatory Visit: Payer: Self-pay

## 2020-10-08 VITALS — BP 125/75 | HR 67 | Temp 98.5°F | Ht 62.0 in | Wt 322.0 lb

## 2020-10-08 DIAGNOSIS — R7303 Prediabetes: Secondary | ICD-10-CM | POA: Diagnosis not present

## 2020-10-08 DIAGNOSIS — Z6841 Body Mass Index (BMI) 40.0 and over, adult: Secondary | ICD-10-CM

## 2020-10-08 DIAGNOSIS — F3289 Other specified depressive episodes: Secondary | ICD-10-CM | POA: Diagnosis not present

## 2020-10-08 MED ORDER — BUPROPION HCL ER (SR) 200 MG PO TB12: 200.0000 mg | ORAL_TABLET | Freq: Every day | ORAL | 0 refills | Status: DC

## 2020-10-08 MED ORDER — OZEMPIC (0.25 OR 0.5 MG/DOSE) 2 MG/1.5ML ~~LOC~~ SOPN: 0.2500 mg | PEN_INJECTOR | SUBCUTANEOUS | 0 refills | Status: DC

## 2020-10-08 NOTE — Progress Notes (Signed)
Chief Complaint:   OBESITY [name] is here to discuss her progress with her obesity treatment plan along with follow-up of her obesity related diagnoses. [name] is on keeping a food journal and adhering to recommended goals of 1400-1500 calories and 80-90 grams of protein daily and states she is following her eating plan approximately 55% of the time. [name] states she is walking more for 10 minutes 7 times per week.  Today's visit was #: 35 Starting weight: 296 lbs Starting date: 02/08/2018 Today's weight: 322 lbs Today's date: 10/08/2020 Total lbs lost to date: 0 Total lbs lost since last in-office visit: 0  Interim History: [name] is up 4 lbs today. She notes some meal skipping. She is journaling about half of the time.  She thinks she is meeting her protein goals. She is being more active - going shopping without using a scooter. She tends to not journal if she skips breakfast.  Subjective:   1. Pre-diabetes [name] is on Ozempic 0.25 mg weekly. She has had a few episodes of perspiring and feeling jittery if she skips breakfast.   Lab Results  Component Value Date   HGBA1C 5.7 08/15/2020   Lab Results  Component Value Date   INSULIN 13.4 04/25/2020   INSULIN 9.2 11/17/2019   INSULIN 14.4 08/11/2019   INSULIN 4.8 06/30/2018   INSULIN 5.8 02/08/2018   2. Other depression, with emotional eating [name] notes dry mouth, but she denies cravings. She is seeing a counselor weekly and she feels this is quite beneficial.   Assessment/Plan:   1. Pre-diabetes . We will refill Ozempic for 1 month.  - Semaglutide,0.25 or 0.5MG /DOS, (OZEMPIC, 0.25 OR 0.5 MG/DOSE,) 2 MG/1.5ML SOPN; Inject 0.25 mg into the skin once a week.  Dispense: 1.5 mL; Refill: 0  2. Other depression, with emotional eating . We will refill bupropion for 1 month. .   - buPROPion (WELLBUTRIN SR) 200 MG 12 hr tablet; Take 1 tablet (200 mg total) by mouth daily.  Dispense: 30 tablet; Refill: 0  3. Class 3  severe obesity with serious comorbidity and body mass index (BMI) of 50.0 to 59.9 in adult, unspecified obesity type [name] is currently in the action stage of change. As such, her goal is to continue with weight loss efforts. She has agreed to keeping a food journal and adhering to recommended goals of 1400-1500 calories and 80-90 grams of protein daily.   [name] is to journal 5 out of 7 days per week.   Exercise goals: She will work on walking every hour rather than being sedentary for several house in a row.   Behavioral modification strategies: increasing lean protein intake, no skipping meals and keeping a strict food journal.  [name] has agreed to follow-up with our clinic in 3 weeks.   Objective:   Blood pressure 125/75, pulse 67, temperature 98.5 F (36.9 C), height 5\' 2"  (1.575 m), weight (!) 322 lb (146.1 kg), SpO2 98 %. Body mass index is 58.89 kg/m.  [name] uses a walker for ambulation. General: Cooperative, alert, well developed, in no acute distress. HEENT: Conjunctivae and lids unremarkable. Cardiovascular: Regular rhythm.  Lungs: Normal work of breathing. Neurologic: No focal deficits.   Lab Results  Component Value Date   CREATININE 1.0 08/15/2020   BUN 27 (A) 08/15/2020   NA 140 08/15/2020   K 4.7 08/15/2020   CL 103 08/15/2020   CO2 27 (A) 08/15/2020   Lab Results  Component Value Date   ALT  10 08/15/2020   AST 14 08/15/2020   ALKPHOS 54 08/15/2020   BILITOT 0.3 04/25/2020   Lab Results  Component Value Date   HGBA1C 5.7 08/15/2020   HGBA1C 5.5 04/25/2020   HGBA1C 5.6 11/17/2019   HGBA1C 5.8 (H) 08/11/2019   HGBA1C 5.5 06/30/2018   Lab Results  Component Value Date   INSULIN 13.4 04/25/2020   INSULIN 9.2 11/17/2019   INSULIN 14.4 08/11/2019   INSULIN 4.8 06/30/2018   INSULIN 5.8 02/08/2018   Lab Results  Component Value Date   TSH 4.50 08/15/2020   Lab Results  Component Value Date   CHOL 166 08/15/2020   HDL 60 08/15/2020    LDLCALC 90 08/15/2020   TRIG 86 08/15/2020   CHOLHDL 2.2 02/08/2018   Lab Results  Component Value Date   WBC 7.4 08/15/2020   HGB 12.9 08/15/2020   HCT 41 08/15/2020   MCV 84.7 08/24/2018   PLT 471 (H) 08/24/2018   No results found for: IRON, TIBC, FERRITIN  Obesity Behavioral Intervention:   Approximately 15 minutes were spent on the discussion below.  ASK: We discussed the diagnosis of obesity with [name] today and [name] agreed to give [place] permission to discuss obesity behavioral modification therapy today.  ASSESS: [name] has the diagnosis of obesity and her BMI today is 58.88. [name] is in the action stage of change.   ADVISE: [name] was educated on the multiple health risks of obesity as well as the benefit of weight loss to improve her health. She was advised of the need for long term treatment and the importance of lifestyle modifications to improve her current health and to decrease her risk of future health problems.  AGREE: Multiple dietary modification options and treatment options were discussed and [name] agreed to follow the recommendations documented in the above note.  ARRANGE: [name] was educated on the importance of frequent visits to treat obesity as outlined per CMS and USPSTF guidelines and agreed to schedule her next follow up appointment today.  Attestation Statements:   Reviewed by clinician on day of visit: allergies, medications, problem list, medical history, surgical history, family history, social history, and previous encounter notes.   Trude Mcburney, am acting as Energy manager for [name], FNP-C.  I have reviewed the above documentation for accuracy and completeness, and I agree with the above. -  Jesse Sans, FNP

## 2020-10-09 ENCOUNTER — Encounter (INDEPENDENT_AMBULATORY_CARE_PROVIDER_SITE_OTHER): Payer: Self-pay | Admitting: Family Medicine

## 2020-10-09 ENCOUNTER — Other Ambulatory Visit (INDEPENDENT_AMBULATORY_CARE_PROVIDER_SITE_OTHER): Payer: Self-pay | Admitting: Family Medicine

## 2020-10-09 DIAGNOSIS — F3289 Other specified depressive episodes: Secondary | ICD-10-CM

## 2020-10-30 ENCOUNTER — Ambulatory Visit (INDEPENDENT_AMBULATORY_CARE_PROVIDER_SITE_OTHER): Payer: Medicare Other | Admitting: Family Medicine

## 2020-10-30 ENCOUNTER — Encounter (INDEPENDENT_AMBULATORY_CARE_PROVIDER_SITE_OTHER): Payer: Self-pay | Admitting: Family Medicine

## 2020-10-30 ENCOUNTER — Other Ambulatory Visit (INDEPENDENT_AMBULATORY_CARE_PROVIDER_SITE_OTHER): Payer: Self-pay | Admitting: Family Medicine

## 2020-10-30 ENCOUNTER — Other Ambulatory Visit: Payer: Self-pay

## 2020-10-30 VITALS — BP 118/82 | HR 83 | Temp 97.7°F | Ht 62.0 in | Wt 322.0 lb

## 2020-10-30 DIAGNOSIS — R7303 Prediabetes: Secondary | ICD-10-CM

## 2020-10-30 DIAGNOSIS — Z6841 Body Mass Index (BMI) 40.0 and over, adult: Secondary | ICD-10-CM | POA: Diagnosis not present

## 2020-10-30 DIAGNOSIS — E559 Vitamin D deficiency, unspecified: Secondary | ICD-10-CM

## 2020-10-30 DIAGNOSIS — F3289 Other specified depressive episodes: Secondary | ICD-10-CM | POA: Diagnosis not present

## 2020-10-30 MED ORDER — VITAMIN D (ERGOCALCIFEROL) 1.25 MG (50000 UNIT) PO CAPS: 50000.0000 [IU] | ORAL_CAPSULE | ORAL | 0 refills | Status: DC

## 2020-10-30 MED ORDER — BUPROPION HCL ER (SR) 200 MG PO TB12: 200.0000 mg | ORAL_TABLET | Freq: Every day | ORAL | 0 refills | Status: DC

## 2020-11-01 NOTE — Progress Notes (Signed)
Chief Complaint:   OBESITY [name] is here to discuss her progress with her obesity treatment plan along with follow-up of her obesity related diagnoses. [name] is on keeping a food journal and adhering to recommended goals of 1400-1500 calories and 80-90 g protein and states she is following her eating plan approximately 65% of the time. [name] states she is exercising 0 minutes 0 times per week.  Today's visit was #: 36 Starting weight: 296 lbs Starting date: 02/08/2018 Today's weight: 322 lbs Today's date: 10/30/2020 Total lbs lost to date: +26 lbs Total lbs lost since last in-office visit: 0 lbs Total weight loss percentage to date: 8.78%  Interim History:  [name] was last seen on 10/08/2020 by Adah Salvage, FNP.  She is new to me.   She started the program 01/2018 at 296 lbs. She was 344 lbs on 11/03/2019 and is down since then. [name] has a lot of obstacles to overcome. The last couple of weeks went well and patient is happy she didn't gain weight over the holidays. She is still skipping breakfast most days and tells me it's just very difficult to get all the food in. She often eats what her teenagers eat due to cost, as well.  Plan:  [name] will talk to her PCP regarding possible bariatric surgery consult in addition to our help and guidance.   Assessment/Plan:   1. Pre-diabetes Discussed labs with patient today.  Rees has a diagnosis of prediabetes based on her elevated HgA1c and was informed this puts her at greater risk of developing diabetes. She continues to work on diet and exercise to decrease her risk of diabetes. She denies nausea or hypoglycemia. Tekia is on Ozempic. She declines a need for refill today.  Lab Results  Component Value Date   HGBA1C 5.7 08/15/2020   Lab Results  Component Value Date   INSULIN 13.4 04/25/2020   INSULIN 9.2 11/17/2019   INSULIN 14.4 08/11/2019   INSULIN 4.8 06/30/2018   INSULIN 5.8 02/08/2018   Plan: I discussed with [name]  the importance of increasing protein and decreasing simple carbohydrates. Continue Ozempic at current dose. She declines need for refill at this time.   2. Vitamin D deficiency Discussed labs with patient today. Per patient, PCP checked Vit D and other labs in Oct 2021. [name] states she was not told  A dose adjustment was needed. I do not see these results. Her last Vit D level here was in the about 6 months ago and int he 50-70 range and at goal.   Plan: Refill Vit D for 1 month, as per below. Consider rechecking level in early March to ensure stable levels over winter time on current regimen.  Refill- Vitamin D, Ergocalciferol, (DRISDOL) 1.25 MG (50000 UNIT) CAPS capsule; Take 1 capsule (50,000 Units total) by mouth every 7 (seven) days.  Dispense: 4 capsule; Refill: 0   3. Other depression, with emotional eating [name] is raising her 3 grandchildren who are in 7th, 9th, and 12th grade. Goes to therapist once a week which patient started in December and working out well.  Plan: Refill Wellbutrin for 1 month, as per below. I encouraged CBT weekly and congratulated [name] on putting herself first with this self-care. Discussed this with patient extensively.  Refill- buPROPion (WELLBUTRIN SR) 200 MG 12 hr tablet; Take 1 tablet (200 mg total) by mouth daily.  Dispense: 30 tablet; Refill: 0   4. Class 3 severe obesity with serious comorbidity and body mass index (  BMI) of 50.0 to 59.9 in adult, unspecified obesity type [facility]) [name] is currently in the action stage of change. As such, her goal is to continue with weight loss efforts. She has agreed to keeping a food journal and adhering to recommended goals of 1400-1500 calories and 80-90 g protein.   Exercise goals: Bike or swim. All adults should avoid inactivity. Some physical activity is better than none, and adults who participate in any amount of physical activity gain some health benefits.  Behavioral modification strategies: no skipping  meals, meal planning and cooking strategies, keeping healthy foods in the home and planning for success.   [name] has agreed to follow-up with our clinic in 2-3 weeks wit FNP Dawn. She was informed of the importance of frequent follow-up visits to maximize her success with intensive lifestyle modifications for her multiple health conditions.     Objective:   Blood pressure 118/82, pulse 83, temperature 97.7 F (36.5 C), height 5\' 2"  (1.575 m), weight (!) 322 lb (146.1 kg), SpO2 95 %. Body mass index is 58.89 kg/m.  General: Cooperative, alert, well developed, in no acute distress. HEENT: Conjunctivae and lids unremarkable. Cardiovascular: Regular rhythm.  Lungs: Normal work of breathing. Neurologic: No focal deficits.   Lab Results  Component Value Date   CREATININE 1.0 08/15/2020   BUN 27 (A) 08/15/2020   NA 140 08/15/2020   K 4.7 08/15/2020   CL 103 08/15/2020   CO2 27 (A) 08/15/2020   Lab Results  Component Value Date   ALT 10 08/15/2020   AST 14 08/15/2020   ALKPHOS 54 08/15/2020   BILITOT 0.3 04/25/2020   Lab Results  Component Value Date   HGBA1C 5.7 08/15/2020   HGBA1C 5.5 04/25/2020   HGBA1C 5.6 11/17/2019   HGBA1C 5.8 (H) 08/11/2019   HGBA1C 5.5 06/30/2018   Lab Results  Component Value Date   INSULIN 13.4 04/25/2020   INSULIN 9.2 11/17/2019   INSULIN 14.4 08/11/2019   INSULIN 4.8 06/30/2018   INSULIN 5.8 02/08/2018   Lab Results  Component Value Date   TSH 4.50 08/15/2020   Lab Results  Component Value Date   CHOL 166 08/15/2020   HDL 60 08/15/2020   LDLCALC 90 08/15/2020   TRIG 86 08/15/2020   CHOLHDL 2.2 02/08/2018   Lab Results  Component Value Date   WBC 7.4 08/15/2020   HGB 12.9 08/15/2020   HCT 41 08/15/2020   MCV 84.7 08/24/2018   PLT 471 (H) 08/24/2018   No results found for: IRON, TIBC, FERRITIN    Obesity Behavioral Intervention:   Approximately 15 minutes were spent on the discussion below.  ASK: We discussed the  diagnosis of obesity with 08/26/2018 today and Chonda agreed to give [place] permission to discuss obesity behavioral modification therapy today.  ASSESS: [name] has the diagnosis of obesity and her BMI today is 59. [name] is in the action stage of change.   ADVISE: [name] was educated on the multiple health risks of obesity as well as the benefit of weight loss to improve her health. She was advised of the need for long term treatment and the importance of lifestyle modifications to improve her current health and to decrease her risk of future health problems.  AGREE: Multiple dietary modification options and treatment options were discussed and [name] agreed to follow the recommendations documented in the above note.  ARRANGE: [name] was educated on the importance of frequent visits to treat obesity as outlined per CMS and USPSTF guidelines and  agreed to schedule her next follow up appointment today.   Attestation Statements:   Reviewed by clinician on day of visit: allergies, medications, problem list, medical history, surgical history, family history, social history, and previous encounter notes.  Fredric Dine acting as Location manager for [employer], DO.  I have reviewed the above documentation for accuracy and completeness, and I agree with the above. Marjory Sneddon, D.O.  The [place] was signed into law in 2016 which includes the topic of electronic health records.  This provides immediate access to information in MyChart.  This includes consultation notes, operative notes, office notes, lab results and pathology reports.  If you have any questions about what you read please let us know at your next visit so we can discuss your concerns and take corrective action if need be.  We are right here with you.

## 2020-11-03 ENCOUNTER — Other Ambulatory Visit (INDEPENDENT_AMBULATORY_CARE_PROVIDER_SITE_OTHER): Payer: Self-pay | Admitting: Family Medicine

## 2020-11-03 DIAGNOSIS — R7303 Prediabetes: Secondary | ICD-10-CM

## 2020-11-12 ENCOUNTER — Encounter (INDEPENDENT_AMBULATORY_CARE_PROVIDER_SITE_OTHER): Payer: Self-pay

## 2020-11-12 ENCOUNTER — Telehealth (INDEPENDENT_AMBULATORY_CARE_PROVIDER_SITE_OTHER): Payer: Medicare Other | Admitting: Family Medicine

## 2020-11-12 DIAGNOSIS — R7303 Prediabetes: Secondary | ICD-10-CM | POA: Diagnosis not present

## 2020-11-12 DIAGNOSIS — F3289 Other specified depressive episodes: Secondary | ICD-10-CM | POA: Diagnosis not present

## 2020-11-12 DIAGNOSIS — R42 Dizziness and giddiness: Secondary | ICD-10-CM | POA: Diagnosis not present

## 2020-11-12 DIAGNOSIS — Z6841 Body Mass Index (BMI) 40.0 and over, adult: Secondary | ICD-10-CM

## 2020-11-12 MED ORDER — BUPROPION HCL ER (SR) 200 MG PO TB12: 200.0000 mg | ORAL_TABLET | Freq: Every day | ORAL | 0 refills | Status: DC

## 2020-11-13 ENCOUNTER — Ambulatory Visit (INDEPENDENT_AMBULATORY_CARE_PROVIDER_SITE_OTHER): Payer: Medicare Other | Admitting: Family Medicine

## 2020-11-15 ENCOUNTER — Encounter (INDEPENDENT_AMBULATORY_CARE_PROVIDER_SITE_OTHER): Payer: Self-pay | Admitting: Family Medicine

## 2020-11-15 NOTE — Progress Notes (Signed)
TeleHealth Visit:  Due to the COVID-19 pandemic, this visit was completed with telemedicine (audio/video) technology to reduce patient and provider exposure as well as to preserve personal protective equipment.   [name] has verbally consented to this TeleHealth visit. The patient is located at home, the provider is located at the [facility] office. The participants in this visit include the listed provider and patient. [name] was unable to use realtime audiovisual technology today and the telehealth visit was conducted via telephone (22 minute call).   Chief Complaint: OBESITY [name] is here to discuss her progress with her obesity treatment plan along with follow-up of her obesity related diagnoses. [name] is on keeping a food journal and adhering to recommended goals of 1400-1500 calories and 90 grams of protein and states she is following her eating plan approximately 50% of the time. [name] states she is not exercising.  Today's visit was #: 37 Starting weight: 296 lbs Starting date: 02/08/2018  Interim History: [name] feels she has maintained her weight. She says she is considering weight loss surgery after speaking with Dr. Sharee Holster. [name] has two siblings that has had bariatric surgery and they have done fairly well. She is fearful of sepsis since she had an episode of sepsis when she had osteomyelitis.  Subjective:   1. Other depression, with emotional eating . [name]'s cravings are well controlled with bupropion. She is also on Cymbalta.  2. Prediabetes . She denies nausea or hypoglycemia. [name] is on Ozempic 0.25 mg weekly. She does still skip meals.  Lab Results  Component Value Date   HGBA1C 5.7 08/15/2020   Lab Results  Component Value Date   INSULIN 13.4 04/25/2020   INSULIN 9.2 11/17/2019   INSULIN 14.4 08/11/2019   INSULIN 4.8 06/30/2018   INSULIN 5.8 02/08/2018    3. Dizziness [name] notes some dizziness over last few months. [name]  especially notes dizziness since she her recent upper respiratory infection. She has sensation of room spinning.  Assessment/Plan:   1. Other depression, with emotional eating .Refill bupropion 200 mg #30. No refill.  - buPROPion (WELLBUTRIN SR) 200 MG 12 hr tablet; Take 1 tablet (200 mg total) by mouth daily.  Dispense: 30 tablet; Refill: 0  2. Prediabetes  [name] will continue Ozempic.  3. Dizziness [name] will see PCP for continued dizziness. May be vertigo.  4. Class 3 severe obesity with serious comorbidity and body mass index (BMI) of 50.0 to 59.9 in adult, unspecified obesity type [facility]) [name] is currently in the action stage of change. As such, her goal is to continue with weight loss efforts. She has agreed to keeping a food journal and adhering to recommended goals of 1400-1500 calories and 80-90 grams of protein daily.   We discussed bariatric surgery at length and she will consider it and talk to PCP about this.  Information was provided to sign up for the bariatric surgery [name]). Patient was advised that bariatric surgery is a tool for weight loss and will not be successful long-term without significant dietary changes and exercise.  Exercise goals: Some exercise.  Behavioral modification strategies: increasing lean protein intake and no skipping meals.  Curtina has agreed to follow-up with our clinic in 2 weeks.   Objective:   VITALS: Per patient if applicable, see vitals. GENERAL: Alert and in no acute distress. CARDIOPULMONARY: No increased WOB. Speaking in clear sentences.  PSYCH: Pleasant and cooperative. Speech normal rate and rhythm. Affect is appropriate. Insight  and judgement are appropriate. Attention is focused, linear, and appropriate.  NEURO: Oriented as arrived to appointment on time with no prompting.   Lab Results  Component Value Date   CREATININE 1.0 08/15/2020   BUN 27 (A) 08/15/2020   NA 140  08/15/2020   K 4.7 08/15/2020   CL 103 08/15/2020   CO2 27 (A) 08/15/2020   Lab Results  Component Value Date   ALT 10 08/15/2020   AST 14 08/15/2020   ALKPHOS 54 08/15/2020   BILITOT 0.3 04/25/2020   Lab Results  Component Value Date   HGBA1C 5.7 08/15/2020   HGBA1C 5.5 04/25/2020   HGBA1C 5.6 11/17/2019   HGBA1C 5.8 (H) 08/11/2019   HGBA1C 5.5 06/30/2018   Lab Results  Component Value Date   INSULIN 13.4 04/25/2020   INSULIN 9.2 11/17/2019   INSULIN 14.4 08/11/2019   INSULIN 4.8 06/30/2018   INSULIN 5.8 02/08/2018   Lab Results  Component Value Date   TSH 4.50 08/15/2020   Lab Results  Component Value Date   CHOL 166 08/15/2020   HDL 60 08/15/2020   LDLCALC 90 08/15/2020   TRIG 86 08/15/2020   CHOLHDL 2.2 02/08/2018   Lab Results  Component Value Date   WBC 7.4 08/15/2020   HGB 12.9 08/15/2020   HCT 41 08/15/2020   MCV 84.7 08/24/2018   PLT 471 (H) 08/24/2018   No results found for: IRON, TIBC, FERRITIN  Attestation Statements:   Reviewed by clinician on day of visit: allergies, medications, problem list, medical history, surgical history, family history, social history, and previous encounter notes.    I, Perrin Smack acting as Energy manager for [employer], FNP  I have reviewed the above documentation for accuracy and completeness, and I agree with the above. - Jesse Sans, FNP

## 2020-12-04 ENCOUNTER — Other Ambulatory Visit (INDEPENDENT_AMBULATORY_CARE_PROVIDER_SITE_OTHER): Payer: Self-pay | Admitting: Family Medicine

## 2020-12-04 DIAGNOSIS — E559 Vitamin D deficiency, unspecified: Secondary | ICD-10-CM

## 2020-12-25 ENCOUNTER — Telehealth: Payer: Self-pay | Admitting: Internal Medicine

## 2020-12-25 MED ORDER — XARELTO 20 MG PO TABS: 20.0000 mg | ORAL_TABLET | Freq: Every day | ORAL | 1 refills | Status: DC

## 2020-12-25 NOTE — Telephone Encounter (Signed)
Prescription refill request for Xarelto received.  Indication:afib Last office visit:02/03/20 Weight:146kg Age:65 Scr:1 on 08/15/20 CrCl: 181ml/min

## 2020-12-25 NOTE — Telephone Encounter (Signed)
°*  STAT* If patient is at the pharmacy, call can be transferred to refill team.   1. Which medications need to be refilled? (please list name of each medication and dose if known) XARELTO 20 MG TABS tablet  2. Which pharmacy/location (including street and city if local pharmacy) is medication to be sent to? [id number] - [place], [place] - [address] AT [address]  3. Do they need a 30 day or 90 day supply? 90

## 2021-01-09 DIAGNOSIS — R059 Cough, unspecified: Secondary | ICD-10-CM | POA: Diagnosis not present

## 2021-01-09 DIAGNOSIS — I8001 Phlebitis and thrombophlebitis of superficial vessels of right lower extremity: Secondary | ICD-10-CM | POA: Diagnosis not present

## 2021-02-09 ENCOUNTER — Other Ambulatory Visit: Payer: Self-pay

## 2021-02-09 ENCOUNTER — Emergency Department (HOSPITAL_BASED_OUTPATIENT_CLINIC_OR_DEPARTMENT_OTHER): Payer: Medicare Other | Admitting: Radiology

## 2021-02-09 ENCOUNTER — Emergency Department (HOSPITAL_BASED_OUTPATIENT_CLINIC_OR_DEPARTMENT_OTHER): Payer: Medicare Other

## 2021-02-09 ENCOUNTER — Emergency Department (HOSPITAL_BASED_OUTPATIENT_CLINIC_OR_DEPARTMENT_OTHER)
Admission: EM | Admit: 2021-02-09 | Discharge: 2021-02-09 | Disposition: A | Discharge status: Home / Self Care | Payer: Medicare Other | Attending: Emergency Medicine | Admitting: Emergency Medicine

## 2021-02-09 ENCOUNTER — Encounter (HOSPITAL_BASED_OUTPATIENT_CLINIC_OR_DEPARTMENT_OTHER): Payer: Self-pay | Admitting: *Deleted

## 2021-02-09 DIAGNOSIS — I10 Essential (primary) hypertension: Secondary | ICD-10-CM | POA: Diagnosis not present

## 2021-02-09 DIAGNOSIS — R0789 Other chest pain: Secondary | ICD-10-CM | POA: Insufficient documentation

## 2021-02-09 DIAGNOSIS — Z7901 Long term (current) use of anticoagulants: Secondary | ICD-10-CM | POA: Diagnosis not present

## 2021-02-09 DIAGNOSIS — Z87891 Personal history of nicotine dependence: Secondary | ICD-10-CM | POA: Insufficient documentation

## 2021-02-09 DIAGNOSIS — R079 Chest pain, unspecified: Secondary | ICD-10-CM | POA: Diagnosis not present

## 2021-02-09 DIAGNOSIS — I4891 Unspecified atrial fibrillation: Secondary | ICD-10-CM | POA: Insufficient documentation

## 2021-02-09 DIAGNOSIS — Z79899 Other long term (current) drug therapy: Secondary | ICD-10-CM | POA: Diagnosis not present

## 2021-02-09 DIAGNOSIS — Z0389 Encounter for observation for other suspected diseases and conditions ruled out: Secondary | ICD-10-CM | POA: Diagnosis not present

## 2021-02-09 LAB — CBC
HCT: 39 % (ref 36.0–46.0)
Hemoglobin: 12.1 g/dL (ref 12.0–15.0)
MCH: 24.5 pg — ABNORMAL LOW (ref 26.0–34.0)
MCHC: 31 g/dL (ref 30.0–36.0)
MCV: 78.9 fL — ABNORMAL LOW (ref 80.0–100.0)
Platelets: 266 10*3/uL (ref 150–400)
RBC: 4.94 MIL/uL (ref 3.87–5.11)
RDW: 16.5 % — ABNORMAL HIGH (ref 11.5–15.5)
WBC: 5.9 10*3/uL (ref 4.0–10.5)
nRBC: 0 % (ref 0.0–0.2)

## 2021-02-09 LAB — BASIC METABOLIC PANEL
Anion gap: 7 (ref 5–15)
BUN: 20 mg/dL (ref 8–23)
CO2: 26 mmol/L (ref 22–32)
Calcium: 8.6 mg/dL — ABNORMAL LOW (ref 8.9–10.3)
Chloride: 106 mmol/L (ref 98–111)
Creatinine, Ser: 0.91 mg/dL (ref 0.44–1.00)
GFR, Estimated: >60 mL/min (ref >60)
Glucose, Bld: 85 mg/dL (ref 70–99)
Potassium: 4.4 mmol/L (ref 3.5–5.1)
Sodium: 139 mmol/L (ref 135–145)

## 2021-02-09 LAB — TROPONIN I (HIGH SENSITIVITY)
Troponin I (High Sensitivity): 3 ng/L (ref <18)
Troponin I (High Sensitivity): 3 ng/L (ref <18)

## 2021-02-09 MED ORDER — IOHEXOL 350 MG/ML SOLN
150.0000 mL | Freq: Once | INTRAVENOUS | Status: AC | PRN
  Administered 2021-02-09: 150 mL via INTRAVENOUS

## 2021-02-09 MED ORDER — TRAMADOL HCL 50 MG PO TABS: 50.0000 mg | ORAL_TABLET | Freq: Four times a day (QID) | ORAL | 0 refills | Status: DC | PRN

## 2021-02-09 MED ORDER — MORPHINE SULFATE (PF) 2 MG/ML IV SOLN
2.0000 mg | Freq: Once | INTRAVENOUS | Status: AC
  Administered 2021-02-09: 2 mg via INTRAVENOUS
  Filled 2021-02-09: qty 1

## 2021-02-09 NOTE — Discharge Instructions (Signed)
Make an appointment to follow-up with your cardiologist.  Today's work-up without evidence of any acute cardiac problem.  There was an incidental finding of some dilatation of the first part of your aorta.  Which cardiology will be able to follow.  Not related to your presentation today.  Today's presentation very suggestive of chest wall pain.  Take the tramadol as needed for the pain.

## 2021-02-09 NOTE — ED Notes (Signed)
Pt to CT

## 2021-02-09 NOTE — ED Triage Notes (Signed)
Left side chest pain for about a week. Noted pain in left upper arm and in back, SHOB,movement makes it worse, states she tried to lie on her left side and back but could not. States she is hurting bad in the back of her neck at present that started during the night.

## 2021-02-09 NOTE — ED Provider Notes (Signed)
[facility] EMERGENCY DEPT Provider Note   CSN: [id number] Arrival date & time: 02/09/21  1316     History Chief Complaint  Patient presents with  . Chest Pain    [name] is a 65 y.o. female.  Patient with 1 week history of left anterior chest pain.  Is been constant worse with breathing.  Does radiate to the back.  And just today it started radiating to the neck area.  Associated with shortness of breath with exertion.  Patient is followed by cardiology does have a known history of acute coronary syndrome.  Also has seen pulmonary for this shortness of breath without any acute findings.  Dr. Johney Frame is her cardiologist.        Past Medical History:  Diagnosis Date  . Atrial fibrillation (HCC)   . Chronic lower back pain   . Depression   . DVT (deep venous thrombosis) (HCC) 1990s   LLE  . Fallen arches   . Hypertension   . Hyperthyroidism ~ 2000   "fine now" (04/23/2016)  . Joint pain   . Lactose intolerance   . Leg edema   . Obesity   . Pneumonia 1980s X 1  . Snoring    has not had sleep study but suspects sleep apnea    Patient Active Problem List   Diagnosis Date Noted  . Insulin resistance 05/11/2020  . DOE (dyspnea on exertion) 02/09/2020  . Depression 09/26/2019  . Vitamin D deficiency 09/01/2019  . Class 3 severe obesity with serious comorbidity and body mass index (BMI) of 50.0 to 59.9 in adult (HCC) 09/01/2019  . Prediabetes 08/15/2019  . Atrial fibrillation (HCC)   . Chest pain 04/22/2016  . Hypokalemia 04/22/2016  . Morbid obesity (HCC) 04/22/2016  . Hypertension 04/22/2016  . Paroxysmal A-fib (HCC) 04/22/2016  . Hypomagnesemia 04/22/2016    Past Surgical History:  Procedure Laterality Date  . CARDIOVERSION N/A 06/18/2016   Procedure: CARDIOVERSION;  Surgeon: Laurey Morale, MD;  Location: [facility] ENDOSCOPY;  Service: Cardiovascular;  Laterality: N/A;  . CARDIOVERSION N/A 07/11/2016   Procedure: CARDIOVERSION;  Surgeon: Lewayne Bunting, MD;  Location: [facility] ENDOSCOPY;  Service: Cardiovascular;  Laterality: N/A;  . CARDIOVERSION N/A 07/02/2017   Procedure: CARDIOVERSION;  Surgeon: Thurmon Fair, MD;  Location: [facility] ENDOSCOPY;  Service: Cardiovascular;  Laterality: N/A;  . CARDIOVERSION N/A 09/29/2017   Procedure: CARDIOVERSION;  Surgeon: Laurey Morale, MD;  Location: [facility] ENDOSCOPY;  Service: Cardiovascular;  Laterality: N/A;  . ENDOMETRIAL ABLATION  ~2006  . TONSILLECTOMY  1960s  . TUBAL LIGATION  1979     OB History    Gravida  2   Para      Term      Preterm      AB      Living  2     SAB      IAB      Ectopic      Multiple      Live Births              Family History  Problem Relation Age of Onset  . Diabetes Mellitus I Father   . CVA Father   . Arthritis Mother   . Obesity Mother   . Thyroid disease Mother   . Liver disease Mother   . CAD Maternal Grandfather     Social History   Tobacco Use  . Smoking status: Former Smoker    Packs/day: 1.00    Years: 10.00  Pack years: 10.00    Types: Cigarettes    Quit date: 10/28/1987    Years since quitting: 33.3  . Smokeless tobacco: Never Used  Substance Use Topics  . Alcohol use: Yes    Alcohol/week: 4.0 standard drinks    Types: 4 Glasses of wine per week    Comment: recently quit  . Drug use: Yes    Types: Marijuana    Comment: 04/23/2016 "recreational marijuana in my late teens/early 20's"    Home Medications Prior to Admission medications   Medication Sig Start Date End Date Taking? Authorizing Provider  acetaminophen (TYLENOL) 325 MG tablet Take 2 tablets by mouth as needed for pain.   Yes [provider]  Ascorbic Acid (VITAMIN C) 1000 MG tablet Take 1,000 mg by mouth daily.   Yes [provider]  buPROPion (WELLBUTRIN SR) 200 MG 12 hr tablet Take 1 tablet (200 mg total) by mouth daily. 11/12/20  Yes Whitmire, Dawn W, FNP  DULoxetine (CYMBALTA) 30 MG capsule Take 90 mg by mouth daily.   Yes  [provider]  DULoxetine (CYMBALTA) 60 MG capsule Take 60 mg by mouth daily. 12/27/19  Yes [provider]  gabapentin (NEURONTIN) 400 MG capsule Take 400 mg by mouth 3 (three) times daily. 01/01/20  Yes [provider]  metoprolol tartrate (LOPRESSOR) 25 MG tablet TAKE 1/2 TABLET(12.5 MG TOTAL) BY MOUTH TWICE DAILY 03/14/20  Yes Allred, Fayrene Fearing, MD  triamterene-hydrochlorothiazide (MAXZIDE-25) 37.5-25 MG tablet Take 1 tablet by mouth every morning.  12/27/19  Yes [provider]  XARELTO 20 MG TABS tablet Take 1 tablet (20 mg total) by mouth daily with supper. 12/25/20  Yes Allred, Fayrene Fearing, MD  OZEMPIC, 0.25 OR 0.5 MG/DOSE, 2 MG/1.5ML SOPN INJECT 0.25MG  INTO THE SKIN ONCE A WEEK 11/05/20   Whitmire, Dawn W, FNP  Vitamin D, Ergocalciferol, (DRISDOL) 1.25 MG (50000 UNIT) CAPS capsule Take 1 capsule (50,000 Units total) by mouth every 7 (seven) days. 10/30/20   Thomasene Lot, DO    Allergies    Codeine  Review of Systems   Review of Systems  Constitutional: Negative for chills and fever.  HENT: Negative for congestion, rhinorrhea and sore throat.   Eyes: Negative for visual disturbance.  Respiratory: Positive for shortness of breath. Negative for cough.   Cardiovascular: Positive for chest pain. Negative for leg swelling.  Gastrointestinal: Negative for abdominal pain, diarrhea, nausea and vomiting.  Genitourinary: Negative for dysuria.  Musculoskeletal: Negative for back pain and neck pain.  Skin: Negative for rash.  Neurological: Negative for dizziness, light-headedness and headaches.  Hematological: Does not bruise/bleed easily.  Psychiatric/Behavioral: Negative for confusion.    Physical Exam Updated Vital Signs BP 103/73 (BP Location: Right Arm)   Pulse 63   Temp 98.4 F (36.9 C) (Oral)   Resp 20   Ht 1.575 m (5\' 2" )   Wt (!) 150.6 kg   SpO2 96%   BMI 60.72 kg/m   Physical Exam Vitals and nursing note reviewed.  Constitutional:      General:  She is not in acute distress.    Appearance: Normal appearance. She is well-developed.  HENT:     Head: Normocephalic and atraumatic.  Eyes:     Conjunctiva/sclera: Conjunctivae normal.     Pupils: Pupils are equal, round, and reactive to light.  Cardiovascular:     Rate and Rhythm: Normal rate and regular rhythm.     Heart sounds: No murmur heard.   Pulmonary:     Effort:  Pulmonary effort is normal. No respiratory distress.     Breath sounds: Normal breath sounds.  Chest:     Chest wall: No tenderness.  Abdominal:     Palpations: Abdomen is soft.     Tenderness: There is no abdominal tenderness.  Musculoskeletal:        General: Swelling present.     Cervical back: Neck supple.  Skin:    General: Skin is warm and dry.  Neurological:     General: No focal deficit present.     Mental Status: She is alert and oriented to person, place, and time.     Cranial Nerves: No cranial nerve deficit.     Sensory: No sensory deficit.     Motor: No weakness.     ED Results / Procedures / Treatments   Labs (all labs ordered are listed, but only abnormal results are displayed) Labs Reviewed  BASIC METABOLIC PANEL - Abnormal; Notable for the following components:      Result Value   Calcium 8.6 (*)    All other components within normal limits  CBC - Abnormal; Notable for the following components:   MCV 78.9 (*)    MCH 24.5 (*)    RDW 16.5 (*)    All other components within normal limits  TROPONIN I (HIGH SENSITIVITY)  TROPONIN I (HIGH SENSITIVITY)    EKG EKG Interpretation  Date/Time:  Saturday February 09 2021 13:39:25 EDT Ventricular Rate:  67 PR Interval:    QRS Duration: 112 QT Interval:  403 QTC Calculation: 426 R Axis:   -2 Text Interpretation: Atrial fibrillation Borderline intraventricular conduction delay Abnormal R-wave progression, early transition Confirmed by Norman Clay ([id number]) on 02/09/2021 1:42:07 PM   Radiology DG Chest 2 View  Result Date:  02/09/2021 CLINICAL DATA:  Left chest pain for 1 week. EXAM: CHEST - 2 VIEW COMPARISON:  February 09, 2020 FINDINGS: The heart size and mediastinal contours are within normal limits. Both lungs are clear. The visualized skeletal structures are unremarkable. IMPRESSION: No active cardiopulmonary disease. Electronically Signed   By: Sherian Rein M.D.   On: 02/09/2021 14:27    Procedures Procedures   Medications Ordered in ED Medications  morphine 2 MG/ML injection 2 mg (has no administration in time range)  iohexol (OMNIPAQUE) 350 MG/ML injection 150 mL (has no administration in time range)    ED Course  I have reviewed the triage vital signs and the nursing notes.  Pertinent labs & imaging results that were available during my care of the patient were reviewed by me and considered in my medical decision making (see chart for details).    MDM Rules/Calculators/A&P                         Work-up for the chest pain.troponins x2 are normal.  Chest x-ray negative.  CT angio neck and chest aorta without any acute findings.  Incidental finding of some mild ascending aorta dilatation.  But should not be related to today's complaint.  No evidence of pulmonary embolus.  No evidence of occult pneumonia.  Patient's oxygen saturations at rest are normal.  Patient stable for discharge home follow-up with cardiology.  This may very well be chest wall in nature since it hurts to breathe.  Again no evidence of pulmonary embolus.  We will go ahead and treat with a short course of tramadol.  Have her follow-up with her cardiologist.     Final Clinical Impression(s) / ED  Diagnoses Final diagnoses:  Chest pain, unspecified type    Rx / DC Orders ED Discharge Orders    None       Vanetta MuldersZackowski, Norman Piacentini, MD 02/09/21 1652

## 2021-02-09 NOTE — ED Notes (Signed)
Pt back from X-ray.  

## 2021-02-09 NOTE — ED Notes (Signed)
MD @ BS

## 2021-02-09 NOTE — ED Provider Notes (Signed)
[facility] EMERGENCY DEPT Provider Note   CSN: [id number] Arrival date & time: 02/09/21  1316     History Chief Complaint  Patient presents with  . Chest Pain    [name] is a 65 y.o. female.  Patient presents with complaint of chest pain described as sharp in the mid chest radiates to her back into her left side of the neck.  She is not sure if her neck and back pain are worse or her chest pain is worse.  Symptoms have been going off and on for the past 2 to 3 days worse today so she presents to ER.  Denies any fevers or cough.  Denies vomiting or diarrhea.  Nothing seems to make the pain worse such as certain movements or pushing on her chest.        Past Medical History:  Diagnosis Date  . Atrial fibrillation (HCC)   . Chronic lower back pain   . Depression   . DVT (deep venous thrombosis) (HCC) 1990s   LLE  . Fallen arches   . Hypertension   . Hyperthyroidism ~ 2000   "fine now" (04/23/2016)  . Joint pain   . Lactose intolerance   . Leg edema   . Obesity   . Pneumonia 1980s X 1  . Snoring    has not had sleep study but suspects sleep apnea    Patient Active Problem List   Diagnosis Date Noted  . Insulin resistance 05/11/2020  . DOE (dyspnea on exertion) 02/09/2020  . Depression 09/26/2019  . Vitamin D deficiency 09/01/2019  . Class 3 severe obesity with serious comorbidity and body mass index (BMI) of 50.0 to 59.9 in adult (HCC) 09/01/2019  . Prediabetes 08/15/2019  . Atrial fibrillation (HCC)   . Chest pain 04/22/2016  . Hypokalemia 04/22/2016  . Morbid obesity (HCC) 04/22/2016  . Hypertension 04/22/2016  . Paroxysmal A-fib (HCC) 04/22/2016  . Hypomagnesemia 04/22/2016    Past Surgical History:  Procedure Laterality Date  . CARDIOVERSION N/A 06/18/2016   Procedure: CARDIOVERSION;  Surgeon: Laurey Morale, MD;  Location: [facility] ENDOSCOPY;  Service: Cardiovascular;  Laterality: N/A;  . CARDIOVERSION N/A 07/11/2016   Procedure:  CARDIOVERSION;  Surgeon: Lewayne Bunting, MD;  Location: [facility] ENDOSCOPY;  Service: Cardiovascular;  Laterality: N/A;  . CARDIOVERSION N/A 07/02/2017   Procedure: CARDIOVERSION;  Surgeon: Thurmon Fair, MD;  Location: [facility] ENDOSCOPY;  Service: Cardiovascular;  Laterality: N/A;  . CARDIOVERSION N/A 09/29/2017   Procedure: CARDIOVERSION;  Surgeon: Laurey Morale, MD;  Location: [facility] ENDOSCOPY;  Service: Cardiovascular;  Laterality: N/A;  . ENDOMETRIAL ABLATION  ~2006  . TONSILLECTOMY  1960s  . TUBAL LIGATION  1979     OB History    Gravida  2   Para      Term      Preterm      AB      Living  2     SAB      IAB      Ectopic      Multiple      Live Births              Family History  Problem Relation Age of Onset  . Diabetes Mellitus I Father   . CVA Father   . Arthritis Mother   . Obesity Mother   . Thyroid disease Mother   . Liver disease Mother   . CAD Maternal Grandfather     Social History   Tobacco  Use  . Smoking status: Former Smoker    Packs/day: 1.00    Years: 10.00    Pack years: 10.00    Types: Cigarettes    Quit date: 10/28/1987    Years since quitting: 33.3  . Smokeless tobacco: Never Used  Substance Use Topics  . Alcohol use: Yes    Alcohol/week: 4.0 standard drinks    Types: 4 Glasses of wine per week    Comment: recently quit  . Drug use: Yes    Types: Marijuana    Comment: 04/23/2016 "recreational marijuana in my late teens/early 20's"    Home Medications Prior to Admission medications   Medication Sig Start Date End Date Taking? Authorizing Provider  acetaminophen (TYLENOL) 325 MG tablet Take 2 tablets by mouth as needed for pain.   Yes [provider]  Ascorbic Acid (VITAMIN C) 1000 MG tablet Take 1,000 mg by mouth daily.   Yes [provider]  buPROPion (WELLBUTRIN SR) 200 MG 12 hr tablet Take 1 tablet (200 mg total) by mouth daily. 11/12/20  Yes Whitmire, Dawn W, FNP  DULoxetine (CYMBALTA) 30 MG capsule Take 90 mg  by mouth daily.   Yes [provider]  DULoxetine (CYMBALTA) 60 MG capsule Take 60 mg by mouth daily. 12/27/19  Yes [provider]  gabapentin (NEURONTIN) 400 MG capsule Take 400 mg by mouth 3 (three) times daily. 01/01/20  Yes [provider]  metoprolol tartrate (LOPRESSOR) 25 MG tablet TAKE 1/2 TABLET(12.5 MG TOTAL) BY MOUTH TWICE DAILY 03/14/20  Yes Allred, Fayrene Fearing, MD  triamterene-hydrochlorothiazide (MAXZIDE-25) 37.5-25 MG tablet Take 1 tablet by mouth every morning.  12/27/19  Yes [provider]  XARELTO 20 MG TABS tablet Take 1 tablet (20 mg total) by mouth daily with supper. 12/25/20  Yes Allred, Fayrene Fearing, MD  OZEMPIC, 0.25 OR 0.5 MG/DOSE, 2 MG/1.5ML SOPN INJECT 0.25MG  INTO THE SKIN ONCE A WEEK 11/05/20   Whitmire, Dawn W, FNP  Vitamin D, Ergocalciferol, (DRISDOL) 1.25 MG (50000 UNIT) CAPS capsule Take 1 capsule (50,000 Units total) by mouth every 7 (seven) days. 10/30/20   Thomasene Lot, DO    Allergies    Codeine  Review of Systems   Review of Systems  Constitutional: Negative for fever.  HENT: Negative for ear pain.   Eyes: Negative for pain.  Respiratory: Negative for cough.   Cardiovascular: Positive for chest pain.  Gastrointestinal: Negative for abdominal pain.  Genitourinary: Negative for flank pain.  Musculoskeletal: Negative for back pain.  Skin: Negative for rash.  Neurological: Negative for headaches.    Physical Exam Updated Vital Signs BP 103/73 (BP Location: Right Arm)   Pulse 63   Temp 98.4 F (36.9 C) (Oral)   Resp 20   Ht 5\' 2"  (1.575 m)   Wt (!) 150.6 kg   SpO2 96%   BMI 60.72 kg/m   Physical Exam Constitutional:      General: She is not in acute distress.    Appearance: Normal appearance.  HENT:     Head: Normocephalic.     Nose: Nose normal.  Eyes:     Extraocular Movements: Extraocular movements intact.  Cardiovascular:     Rate and Rhythm: Normal rate.  Pulmonary:     Effort: Pulmonary effort is normal.   Musculoskeletal:        General: Normal range of motion.     Cervical back: Normal range of motion.  Neurological:     General: No focal deficit present.  Mental Status: She is alert and oriented to person, place, and time. Mental status is at baseline.     Cranial Nerves: No cranial nerve deficit.     Motor: No weakness.     ED Results / Procedures / Treatments   Labs (all labs ordered are listed, but only abnormal results are displayed) Labs Reviewed  BASIC METABOLIC PANEL - Abnormal; Notable for the following components:      Result Value   Calcium 8.6 (*)    All other components within normal limits  CBC - Abnormal; Notable for the following components:   MCV 78.9 (*)    MCH 24.5 (*)    RDW 16.5 (*)    All other components within normal limits  TROPONIN I (HIGH SENSITIVITY)  TROPONIN I (HIGH SENSITIVITY)    EKG EKG Interpretation  Date/Time:  Saturday February 09 2021 13:39:25 EDT Ventricular Rate:  67 PR Interval:    QRS Duration: 112 QT Interval:  403 QTC Calculation: 426 R Axis:   -2 Text Interpretation: Atrial fibrillation Borderline intraventricular conduction delay Abnormal R-wave progression, early transition Confirmed by Norman Clay ([id number]) on 02/09/2021 1:42:07 PM   Radiology DG Chest 2 View  Result Date: 02/09/2021 CLINICAL DATA:  Left chest pain for 1 week. EXAM: CHEST - 2 VIEW COMPARISON:  February 09, 2020 FINDINGS: The heart size and mediastinal contours are within normal limits. Both lungs are clear. The visualized skeletal structures are unremarkable. IMPRESSION: No active cardiopulmonary disease. Electronically Signed   By: Sherian Rein M.D.   On: 02/09/2021 14:27    Procedures Procedures   Medications Ordered in ED Medications  morphine 2 MG/ML injection 2 mg (has no administration in time range)  iohexol (OMNIPAQUE) 350 MG/ML injection 150 mL (has no administration in time range)    ED Course  I have reviewed the triage vital signs and  the nursing notes.  Pertinent labs & imaging results that were available during my care of the patient were reviewed by me and considered in my medical decision making (see chart for details).    MDM Rules/Calculators/A&P                          Initial labs unremarkable troponin is negative.  Patient given IV morphine for her pain.  Given her symptoms CT angio of the chest and neck pursued.  Second troponin pending as well.  Will be signed out to oncoming provider.  Final Clinical Impression(s) / ED Diagnoses Final diagnoses:  Chest pain, unspecified type    Rx / DC Orders ED Discharge Orders    None       Cheryll Cockayne, MD 02/09/21 1524

## 2021-02-12 NOTE — Progress Notes (Addendum)
Cardiology Office Note Date:  02/14/2021  Patient ID:  [name], [name] [birth year], MRN [id number] PCP:  Laurann Montana, MD  Electrophysiologist: Dr. Johney Frame    Chief Complaint: CP  History of Present Illness: [name] is a 65 y.o. female with history of HTN, DVT, AFib, morbid obesity.  She come sin today to be seen for Dr. Johney Frame, last seen by him  April 2021, at that time finally starting to get over a long road with osteomyelitis of her foot and immobility issues with that, subsequent weight gain and associated DOE with it.  Motivated to get back in track with exercise and weight loss.  Planned to update her echo. Referred to Dr. Earl Gala for snoring  ER visit 02/09/21 with CP chest CTonly noted mild dilation of aorta, no acute findings, labs unremarkable, EKG not ischemic, HS trop neg x2 Discharged from the ER.  TODAY She is accompanied by he daughter She reports the CP on/off, waxing/waning about 4 months She locates it to the lower L rib border and radiates to her back, has a hadr time describing it, maybe a stabbing pain She also had neck pain that is posterior neck from the base of her skull down to her shoulder that is a tense sensation Sometimes deep breathing can make her pain worse Ambulation not predictably Not tender or reproducible with palpation  She mentions seeing her PMD for her breathing problem again and given some antibiotics, reports seeing a pulmonologist a year ago for SOB and no lung problems were found. Can sometimes her herself wheezing though No symptoms of PND or orthopnea  No near syncope or syncope     Past Medical History:  Diagnosis Date  . Atrial fibrillation (HCC)   . Chronic lower back pain   . Depression   . DVT (deep venous thrombosis) (HCC) 1990s   LLE  . Fallen arches   . Hypertension   . Hyperthyroidism ~ 2000   "fine now" (04/23/2016)  . Joint pain   . Lactose intolerance   . Leg edema   . Obesity   . Pneumonia  1980s X 1  . Snoring    has not had sleep study but suspects sleep apnea    Past Surgical History:  Procedure Laterality Date  . CARDIOVERSION N/A 06/18/2016   Procedure: CARDIOVERSION;  Surgeon: Laurey Morale, MD;  Location: [facility] ENDOSCOPY;  Service: Cardiovascular;  Laterality: N/A;  . CARDIOVERSION N/A 07/11/2016   Procedure: CARDIOVERSION;  Surgeon: Lewayne Bunting, MD;  Location: [facility] ENDOSCOPY;  Service: Cardiovascular;  Laterality: N/A;  . CARDIOVERSION N/A 07/02/2017   Procedure: CARDIOVERSION;  Surgeon: Thurmon Fair, MD;  Location: [facility] ENDOSCOPY;  Service: Cardiovascular;  Laterality: N/A;  . CARDIOVERSION N/A 09/29/2017   Procedure: CARDIOVERSION;  Surgeon: Laurey Morale, MD;  Location: [facility] ENDOSCOPY;  Service: Cardiovascular;  Laterality: N/A;  . ENDOMETRIAL ABLATION  ~2006  . TONSILLECTOMY  1960s  . TUBAL LIGATION  1979    Current Outpatient Medications  Medication Sig Dispense Refill  . acetaminophen (TYLENOL) 325 MG tablet Take 2 tablets by mouth as needed for pain.    . Ascorbic Acid (VITAMIN C) 1000 MG tablet Take 1,000 mg by mouth daily.    Marland Kitchen buPROPion (WELLBUTRIN SR) 200 MG 12 hr tablet Take 1 tablet (200 mg total) by mouth daily. 30 tablet 0  . DULoxetine (CYMBALTA) 30 MG capsule Take 90 mg by mouth daily.    . DULoxetine (CYMBALTA) 60 MG capsule Take 60  mg by mouth daily.    Marland Kitchen gabapentin (NEURONTIN) 400 MG capsule Take 400 mg by mouth 3 (three) times daily.    . metoprolol tartrate (LOPRESSOR) 100 MG tablet Take 1 tablet (100 mg total) by mouth once for 1 dose. 2 hours prior to procedure 1 tablet 0  . metoprolol tartrate (LOPRESSOR) 25 MG tablet TAKE 1/2 TABLET(12.5 MG TOTAL) BY MOUTH TWICE DAILY 90 tablet 3  . traMADol (ULTRAM) 50 MG tablet Take 1 tablet (50 mg total) by mouth every 6 (six) hours as needed. 15 tablet 0  . triamterene-hydrochlorothiazide (MAXZIDE-25) 37.5-25 MG tablet Take 1 tablet by mouth every morning.     [name] 20 MG TABS tablet Take 1 tablet  (20 mg total) by mouth daily with supper. 90 tablet 1   No current facility-administered medications for this visit.    Allergies:   Amlodipine besylate, Codeine, and Triamcinolone   Social History:  The patient  reports that she quit smoking about 33 years ago. Her smoking use included cigarettes. She has a 10.00 pack-year smoking history. She has never used smokeless tobacco. She reports current alcohol use of about 4.0 standard drinks of alcohol per week. She reports current drug use. Drug: Marijuana.   Family History:  The patient's family history includes Arthritis in her mother; CAD in her maternal grandfather; CVA in her father; Diabetes Mellitus I in her father; Liver disease in her mother; Obesity in her mother; Thyroid disease in her mother.  ROS:  Please see the history of present illness.    All other systems are reviewed and otherwise negative.   PHYSICAL EXAM:  VS:  BP 124/76   Pulse 79   Ht 5\' 2"  (1.575 m)   Wt (!) 337 lb 3.2 oz (153 kg)   SpO2 96%   BMI 61.67 kg/m  BMI: Body mass index is 61.67 kg/m. Well nourished, well developed, in no acute distress HEENT: normocephalic, atraumatic Neck: no JVD, carotid bruits or masses Cardiac:  RRR; no significant murmurs, no rubs, or gallops Lungs:  CTA b/l, no wheezing, rhonchi or rales Abd: soft, nontender, obese MS: no deformity or atrophy Ext:  no edema Skin: warm and dry, no rash Neuro:  No gross deficits appreciated Psych: euthymic mood, full affect    EKG:  Not done today ER EKG is reviewed from 02/09/21:  AFib 67bpm, no ST/T changes  CT angio neck and chest IMPRESSION: 1. Streak artifact from a dense right-sided contrast bolus somewhat limits evaluation of the proximal right common carotid artery. Within this limitation, the bilateral common carotid and internal carotid arteries are patent within the neck without stenosis or significant atherosclerotic disease. No evidence of dissection. Tortuosity of the  cervical ICAs bilaterally. 2. The right vertebral artery is dominant and patent throughout the neck without stenosis or evidence of dissection. 3. The left vertebral artery is developmentally diminutive, but appears patent throughout the neck. 4. CTA chest separately reported.  IMPRESSION: No evidence of aortic dissection. Mild aneurysmal dilatation to 4.2 cm in the ascending aorta. Recommend annual imaging followup by CTA or MRA. This recommendation follows 2010 ACCF/AHA/AATS/ACR/ASA/SCA/SCAI/SIR/STS/SVM Guidelines for the Diagnosis and Management of Patients with Thoracic Aortic Disease. Circulation. 2010; 1212011. Aortic aneurysm NOS (ICD10-I71.9)   02/23/2020: TTE IMPRESSIONS  1. Left ventricular ejection fraction, by estimation, is 55 to 60%. The  left ventricle has normal function. The left ventricle has no regional  wall motion abnormalities. There is moderate left ventricular hypertrophy.  Left ventricular diastolic function  could not be evaluated.  2. Right ventricular systolic function is normal. The right ventricular  size is normal. There is normal pulmonary artery systolic pressure.  3. Left atrial size was severely dilated.  4. The mitral valve is abnormal. Mild to moderate mitral valve  regurgitation.  5. The aortic valve is tricuspid. Aortic valve regurgitation is not  visualized.  6. Aortic dilatation noted. There is mild dilatation of the ascending  aorta measuring 42 mm.  7. The inferior vena cava is normal in size with greater than 50%  respiratory variability, suggesting right atrial pressure of 3 mmHg.     02/23/2020; stress myoview  The left ventricular ejection fraction is normal (55-65%).  Nuclear stress EF: 49%.  There was no ST segment deviation noted during stress.  The study is normal.  This is a low risk study.   Normal pharmacologic nuclear stress test with no evidence for prior infarct or ischemia. LVEF 55%. Frequent PVCs  during stress and in recovery.     Recent Labs: 08/15/2020: ALT 10; TSH 4.50 02/09/2021: BUN 20; Creatinine, Ser 0.91; Hemoglobin 12.1; Platelets 266; Potassium 4.4; Sodium 139  08/15/2020: Cholesterol 166; HDL 60; LDL Cholesterol 90; Triglycerides 86   Estimated Creatinine Clearance: 92.4 mL/min (by C-G formula based on SCr of 0.91 mg/dL).   Wt Readings from Last 3 Encounters:  02/14/21 (!) 337 lb 3.2 oz (153 kg)  02/09/21 (!) 332 lb (150.6 kg)  10/30/20 (!) 322 lb (146.1 kg)     Other studies reviewed: Additional studies/records reviewed today include: summarized above  ASSESSMENT AND PLAN:  1. Longstanding persistent Afib     CHA2DS2Vsc is 4, on xarelto, appropriately dosed     Rate controlled  2. HTN     Looks OK  4. Morbid obesity     She is looking into bariatric surgery     Discussed I felt her SOB likely was 2/2 her weight     Encouraged to continue this discussion and evaluation  5. CP     Atypical, and suspect musculoskeletal, discussed that I had very low suspicion/did not think her symptoms were cardiac driven and last year's evaluation was very reassuring as well as negative Trops      she remains quite worried and would really like further evaluation especially with thoughts towards bariatric surgery      I do not hear any wheezing      She does not look volume OL      AF is rate controlled and in d/w reader MD should be able to get good images (aware of body habitus)will plan for CT coronaries      No home BB day of CT, plan for usual pre-scan metoprolol, HR and BP should tolerate  6. Mild aneurysmal dilatation to 4.2 cm in the ascending aorta.     plan annual imaging  Disposition: F/u with 6-8 weeks, sooner if needed pending CT findings  Current medicines are reviewed at length with the patient today.  The patient did not have any concerns regarding medicines.  Norma Fredrickson, PA-C 02/14/2021 6:48 PM     [facility] [address] [place] [address] [phone] (office)  [phone] (fax)

## 2021-02-14 ENCOUNTER — Encounter: Payer: Self-pay | Admitting: Physician Assistant

## 2021-02-14 ENCOUNTER — Ambulatory Visit: Payer: Medicare Other | Admitting: Physician Assistant

## 2021-02-14 ENCOUNTER — Other Ambulatory Visit: Payer: Self-pay

## 2021-02-14 VITALS — BP 124/76 | HR 79 | Ht 62.0 in | Wt 337.2 lb

## 2021-02-14 DIAGNOSIS — I4811 Longstanding persistent atrial fibrillation: Secondary | ICD-10-CM

## 2021-02-14 DIAGNOSIS — I10 Essential (primary) hypertension: Secondary | ICD-10-CM

## 2021-02-14 DIAGNOSIS — R079 Chest pain, unspecified: Secondary | ICD-10-CM

## 2021-02-14 MED ORDER — METOPROLOL TARTRATE 100 MG PO TABS: 100.0000 mg | ORAL_TABLET | Freq: Once | ORAL | 0 refills | Status: DC

## 2021-02-14 NOTE — Patient Instructions (Addendum)
Medication Instructions:   Your physician recommends that you continue on your current medications as directed. Please refer to the Current Medication list given to you today.  *If you need a refill on your cardiac medications before your next appointment, please call your pharmacy*   Lab Work: NONE ORDERED  TODAY   If you have labs (blood work) drawn today and your tests are completely normal, you will receive your results only by: Marland Kitchen MyChart Message (if you have MyChart) OR . A paper copy in the mail If you have any lab test that is abnormal or we need to change your treatment, we will call you to review the results.   Testing/Procedures: Non-Cardiac CT Angiography (CTA), is a special type of CT scan that uses a computer to produce multi-dimensional views of major blood vessels throughout the body. In CT angiography, a contrast material is injected through an IV to help visualize the blood vessels   Follow-Up: At [facility], you and your health needs are our priority.  As part of our continuing mission to provide you with exceptional heart care, we have created designated Provider Care Teams.  These Care Teams include your primary Cardiologist (physician) and Advanced Practice Providers (APPs -  Physician Assistants and Nurse Practitioners) who all work together to provide you with the care you need, when you need it.  We recommend signing up for the patient portal called "MyChart".  Sign up information is provided on this After Visit Summary.  MyChart is used to connect with patients for Virtual Visits (Telemedicine).  Patients are able to view lab/test results, encounter notes, upcoming appointments, etc.  Non-urgent messages can be sent to your provider as well.   To learn more about what you can do with MyChart, go to [email or web address].    Your next appointment:   6 week(s)  The format for your next appointment:   In Person  Provider:   You may see Dr. Johney Frame  or one of  the following Advanced Practice Providers on your designated Care Team:    Gypsy Balsam, NP  Francis Dowse, PA-C  Casimiro Needle "Mardelle Matte" [place], [place]    Other Instructions   Your cardiac CT will be scheduled at one of the below locations:   [facility] [address] [place], [place] [address] [phone]  OR  [facility] [address] [place], [place] [address] [phone]  If scheduled at [facility], please arrive at the [facility] main entrance (entrance A) of [facility] 30 minutes prior to test start time. Proceed to the [facility] Radiology Department (first floor) to check-in and test prep.  If scheduled at [facility], please arrive 15 mins early for check-in and test prep.  Please follow these instructions carefully (unless otherwise directed):   On the Night Before the Test: . Be sure to Drink plenty of water. . Do not consume any caffeinated/decaffeinated beverages or chocolate 12 hours prior to your test.  On the Day of the Test: . Drink plenty of water until 1 hour prior to the test. . Do not eat any food 4 hours prior to the test. . You may take your regular medications prior to the test.  DO NOT TAKE YOUR DOSE OF METOPROLOL  12.5  DOSE THAT DAY OF PROCEDURE  . Take metoprolol 100 MG  (Lopressor) two hours prior to test. . HOLD MAXIDE morning of the test. .  please wear underwire-free  bra if available  Do not give Lopressor to patients with an allergy to lopressor or anyone with asthma or active COPD symptoms (currently taking steroids).       After the Test: . Drink plenty of water. . After receiving IV contrast, you may experience a mild flushed feeling. This is normal. . On occasion, you may experience a mild rash up to 24 hours after the test. This is not dangerous. If this occurs, you can take Benadryl 25 mg and increase your fluid intake. . If you  experience trouble breathing, this can be serious. If it is severe call 911 IMMEDIATELY. If it is mild, please call our office. . If you take any of these medications: Glipizide/Metformin, Avandament, Glucavance, please do not take 48 hours after completing test unless otherwise instructed.   Once we have confirmed authorization from your insurance company, we will call you to set up a date and time for your test. Based on how quickly your insurance processes prior authorizations requests, please allow up to 4 weeks to be contacted for scheduling your Cardiac CT appointment. Be advised that routine Cardiac CT appointments could be scheduled as many as 8 weeks after your provider has ordered it.  For non-scheduling related questions, please contact the cardiac imaging nurse navigator should you have any questions/concerns: Rockwell Alexandria, Cardiac Imaging Nurse Navigator Larey Brick, Cardiac Imaging Nurse Navigator [facility] Direct Office Dial: [phone]   For scheduling needs, including cancellations and rescheduling, please call [place], [phone].   Marland Kitchen

## 2021-02-18 DIAGNOSIS — I480 Paroxysmal atrial fibrillation: Secondary | ICD-10-CM | POA: Diagnosis not present

## 2021-02-18 DIAGNOSIS — R7303 Prediabetes: Secondary | ICD-10-CM | POA: Diagnosis not present

## 2021-02-18 DIAGNOSIS — I10 Essential (primary) hypertension: Secondary | ICD-10-CM | POA: Diagnosis not present

## 2021-02-18 DIAGNOSIS — N289 Disorder of kidney and ureter, unspecified: Secondary | ICD-10-CM | POA: Diagnosis not present

## 2021-02-18 DIAGNOSIS — R0681 Apnea, not elsewhere classified: Secondary | ICD-10-CM | POA: Diagnosis not present

## 2021-02-23 ENCOUNTER — Other Ambulatory Visit: Payer: Self-pay

## 2021-02-23 ENCOUNTER — Telehealth: Payer: Self-pay | Admitting: Nurse Practitioner

## 2021-02-23 ENCOUNTER — Encounter (HOSPITAL_COMMUNITY): Payer: Self-pay | Admitting: Emergency Medicine

## 2021-02-23 ENCOUNTER — Emergency Department (HOSPITAL_COMMUNITY): Payer: Medicare Other

## 2021-02-23 ENCOUNTER — Observation Stay (HOSPITAL_COMMUNITY)
Admission: EM | Admit: 2021-02-23 | Discharge: 2021-02-24 | Disposition: A | Discharge status: Home / Self Care | Payer: Medicare Other | Attending: Internal Medicine | Admitting: Internal Medicine

## 2021-02-23 DIAGNOSIS — I700 Atherosclerosis of aorta: Secondary | ICD-10-CM | POA: Diagnosis not present

## 2021-02-23 DIAGNOSIS — Z87891 Personal history of nicotine dependence: Secondary | ICD-10-CM | POA: Insufficient documentation

## 2021-02-23 DIAGNOSIS — R079 Chest pain, unspecified: Secondary | ICD-10-CM | POA: Diagnosis not present

## 2021-02-23 DIAGNOSIS — I10 Essential (primary) hypertension: Secondary | ICD-10-CM | POA: Diagnosis not present

## 2021-02-23 DIAGNOSIS — Z79899 Other long term (current) drug therapy: Secondary | ICD-10-CM | POA: Diagnosis not present

## 2021-02-23 DIAGNOSIS — I719 Aortic aneurysm of unspecified site, without rupture: Secondary | ICD-10-CM | POA: Insufficient documentation

## 2021-02-23 DIAGNOSIS — I517 Cardiomegaly: Secondary | ICD-10-CM | POA: Diagnosis not present

## 2021-02-23 DIAGNOSIS — J9811 Atelectasis: Secondary | ICD-10-CM | POA: Diagnosis not present

## 2021-02-23 DIAGNOSIS — I4821 Permanent atrial fibrillation: Secondary | ICD-10-CM | POA: Diagnosis not present

## 2021-02-23 DIAGNOSIS — R0789 Other chest pain: Secondary | ICD-10-CM | POA: Diagnosis not present

## 2021-02-23 DIAGNOSIS — Z20822 Contact with and (suspected) exposure to covid-19: Secondary | ICD-10-CM | POA: Diagnosis not present

## 2021-02-23 DIAGNOSIS — Z7901 Long term (current) use of anticoagulants: Secondary | ICD-10-CM | POA: Insufficient documentation

## 2021-02-23 DIAGNOSIS — R7303 Prediabetes: Secondary | ICD-10-CM | POA: Insufficient documentation

## 2021-02-23 LAB — TROPONIN I (HIGH SENSITIVITY)
Troponin I (High Sensitivity): 4 ng/L (ref <18)
Troponin I (High Sensitivity): 4 ng/L (ref <18)

## 2021-02-23 LAB — BASIC METABOLIC PANEL
Anion gap: 8 (ref 5–15)
BUN: 21 mg/dL (ref 8–23)
CO2: 26 mmol/L (ref 22–32)
Calcium: 9.1 mg/dL (ref 8.9–10.3)
Chloride: 105 mmol/L (ref 98–111)
Creatinine, Ser: 1.02 mg/dL — ABNORMAL HIGH (ref 0.44–1.00)
GFR, Estimated: >60 mL/min (ref >60)
Glucose, Bld: 73 mg/dL (ref 70–99)
Potassium: 4.4 mmol/L (ref 3.5–5.1)
Sodium: 139 mmol/L (ref 135–145)

## 2021-02-23 LAB — CBC
HCT: 40.1 % (ref 36.0–46.0)
Hemoglobin: 12.2 g/dL (ref 12.0–15.0)
MCH: 24.7 pg — ABNORMAL LOW (ref 26.0–34.0)
MCHC: 30.4 g/dL (ref 30.0–36.0)
MCV: 81.2 fL (ref 80.0–100.0)
Platelets: 288 10*3/uL (ref 150–400)
RBC: 4.94 MIL/uL (ref 3.87–5.11)
RDW: 16.1 % — ABNORMAL HIGH (ref 11.5–15.5)
WBC: 6.1 10*3/uL (ref 4.0–10.5)
nRBC: 0 % (ref 0.0–0.2)

## 2021-02-23 LAB — RESP PANEL BY RT-PCR (FLU A&B, COVID) ARPGX2
Influenza A by PCR: NEGATIVE
Influenza B by PCR: NEGATIVE
SARS Coronavirus 2 by RT PCR: NEGATIVE

## 2021-02-23 LAB — HIV ANTIBODY (ROUTINE TESTING W REFLEX): HIV Screen 4th Generation wRfx: NONREACTIVE

## 2021-02-23 MED ORDER — TRIAMTERENE-HCTZ 37.5-25 MG PO TABS
1.0000 | ORAL_TABLET | Freq: Every morning | ORAL | Status: DC
  Administered 2021-02-24: 1 via ORAL
  Filled 2021-02-23: qty 1

## 2021-02-23 MED ORDER — GABAPENTIN 400 MG PO CAPS
400.0000 mg | ORAL_CAPSULE | Freq: Three times a day (TID) | ORAL | Status: DC
  Administered 2021-02-23 – 2021-02-24 (×2): 400 mg via ORAL
  Filled 2021-02-23 (×2): qty 1

## 2021-02-23 MED ORDER — NITROGLYCERIN 2 % TD OINT
1.0000 [in_us] | TOPICAL_OINTMENT | Freq: Once | TRANSDERMAL | Status: AC
  Administered 2021-02-23: 1 [in_us] via TOPICAL
  Filled 2021-02-23: qty 1

## 2021-02-23 MED ORDER — RIVAROXABAN 20 MG PO TABS: 20.0000 mg | ORAL_TABLET | Freq: Every day | ORAL | Status: DC

## 2021-02-23 MED ORDER — MELATONIN 3 MG PO TABS
3.0000 mg | ORAL_TABLET | Freq: Every evening | ORAL | Status: DC | PRN
  Administered 2021-02-23: 3 mg via ORAL
  Filled 2021-02-23 (×2): qty 1

## 2021-02-23 MED ORDER — METOPROLOL TARTRATE 12.5 MG HALF TABLET
12.5000 mg | ORAL_TABLET | Freq: Two times a day (BID) | ORAL | Status: DC
  Administered 2021-02-23 – 2021-02-24 (×2): 12.5 mg via ORAL
  Filled 2021-02-23 (×2): qty 1

## 2021-02-23 MED ORDER — BUPROPION HCL ER (SR) 100 MG PO TB12
200.0000 mg | ORAL_TABLET | Freq: Every day | ORAL | Status: DC
  Administered 2021-02-24: 200 mg via ORAL
  Filled 2021-02-23: qty 2

## 2021-02-23 MED ORDER — ASCORBIC ACID 500 MG PO TABS
1000.0000 mg | ORAL_TABLET | Freq: Every day | ORAL | Status: DC
  Administered 2021-02-24: 1000 mg via ORAL
  Filled 2021-02-23: qty 2

## 2021-02-23 MED ORDER — ONDANSETRON HCL 4 MG/2ML IJ SOLN: 4.0000 mg | Freq: Four times a day (QID) | INTRAMUSCULAR | Status: DC | PRN

## 2021-02-23 MED ORDER — POLYETHYLENE GLYCOL 3350 17 G PO PACK: 17.0000 g | PACK | Freq: Every day | ORAL | Status: DC | PRN

## 2021-02-23 MED ORDER — ASPIRIN 81 MG PO CHEW
324.0000 mg | CHEWABLE_TABLET | Freq: Once | ORAL | Status: AC
  Administered 2021-02-23: 324 mg via ORAL
  Filled 2021-02-23: qty 4

## 2021-02-23 MED ORDER — MORPHINE SULFATE (PF) 2 MG/ML IV SOLN: 2.0000 mg | INTRAVENOUS | Status: DC | PRN

## 2021-02-23 MED ORDER — TRAMADOL HCL 50 MG PO TABS
50.0000 mg | ORAL_TABLET | Freq: Four times a day (QID) | ORAL | Status: DC | PRN
  Administered 2021-02-23 – 2021-02-24 (×2): 50 mg via ORAL
  Filled 2021-02-23 (×2): qty 1

## 2021-02-23 MED ORDER — DULOXETINE HCL 30 MG PO CPEP
30.0000 mg | ORAL_CAPSULE | Freq: Every day | ORAL | Status: DC
  Administered 2021-02-24: 30 mg via ORAL
  Filled 2021-02-23: qty 1

## 2021-02-23 MED ORDER — LACTATED RINGERS IV SOLN: INTRAVENOUS | Status: DC

## 2021-02-23 MED ORDER — ACETAMINOPHEN 325 MG PO TABS
650.0000 mg | ORAL_TABLET | ORAL | Status: DC | PRN
  Administered 2021-02-23 – 2021-02-24 (×3): 650 mg via ORAL
  Filled 2021-02-23 (×3): qty 2

## 2021-02-23 NOTE — Telephone Encounter (Signed)
   Pt has been having left arm, chest, epigastric, and left leg pain. She feels like the pain is "in [her] veins."  She was noted to have chest pain @ 4/21 office visit w/ plan for cor CTA pending.  In setting of worsening symptoms this AM, I rec that she present to the ED for eval and w/u.  Caller verbalized understanding and was grateful for the call back.  Nicolasa Ducking, NP 02/23/2021, 11:25 AM

## 2021-02-23 NOTE — ED Triage Notes (Signed)
Pt reports pain under L breast that radiates to L neck and L arm.  States pain is "ongoing" and she was seen at [facility] ED for same on 4/16.

## 2021-02-23 NOTE — ED Notes (Signed)
Pt requested to go to the bathroom.  I advised pt not to ambulated secondary to CP worse with exertion pt refused and ambulated to  [facility]

## 2021-02-23 NOTE — Consult Note (Signed)
Cardiology Consultation:   Patient ID: [name] MRN: [id number]; DOB: 03/26/1956  Admit date: 02/23/2021 Date of Consult: 02/23/2021  PCP:  Laurann Montana, MD   [facility]  Cardiologist:  No primary care provider on file.  Advanced Practice Provider:  No care team member to display Electrophysiologist:  None        Patient Profile:   [name] is a 65 y.o. female with a hx of afib, dvt, HTN, chronic anxiety/depression who is being seen today for the evaluation of chronic chest pain at the request of ED.  History of Present Illness:   [name] a 65 yo lady with above medical history who is being seen for chest pain. Her pain has been ongoing for weeks, but today was worse and more frequent. Left sided substernal and radiating to the left arm and neck. Worse with exertion. She has had work up in the past that has been unremarkable. She was seen by cardiology as an outpatient and was planned for noninvasive testing with coronary CTa but pain worsened.   In the ED, pain was persistent. No ECG changes. Trop ruled out (4 -> 4).    Past Medical History:  Diagnosis Date  . Atrial fibrillation (HCC)   . Chronic lower back pain   . Depression   . DVT (deep venous thrombosis) (HCC) 1990s   LLE  . Fallen arches   . Hypertension   . Hyperthyroidism ~ 2000   "fine now" (04/23/2016)  . Joint pain   . Lactose intolerance   . Leg edema   . Obesity   . Pneumonia 1980s X 1  . Snoring    has not had sleep study but suspects sleep apnea    Past Surgical History:  Procedure Laterality Date  . CARDIOVERSION N/A 06/18/2016   Procedure: CARDIOVERSION;  Surgeon: Laurey Morale, MD;  Location: [facility] ENDOSCOPY;  Service: Cardiovascular;  Laterality: N/A;  . CARDIOVERSION N/A 07/11/2016   Procedure: CARDIOVERSION;  Surgeon: Lewayne Bunting, MD;  Location: [facility] ENDOSCOPY;  Service: Cardiovascular;  Laterality: N/A;  . CARDIOVERSION N/A 07/02/2017    Procedure: CARDIOVERSION;  Surgeon: Thurmon Fair, MD;  Location: [facility] ENDOSCOPY;  Service: Cardiovascular;  Laterality: N/A;  . CARDIOVERSION N/A 09/29/2017   Procedure: CARDIOVERSION;  Surgeon: Laurey Morale, MD;  Location: [facility] ENDOSCOPY;  Service: Cardiovascular;  Laterality: N/A;  . ENDOMETRIAL ABLATION  ~2006  . TONSILLECTOMY  1960s  . TUBAL LIGATION  1979     Home Medications:  Prior to Admission medications   Medication Sig Start Date End Date Taking? Authorizing Provider  acetaminophen (TYLENOL) 325 MG tablet Take 2 tablets by mouth every 4 (four) hours as needed for pain.   Yes [provider]  Ascorbic Acid (VITAMIN C) 1000 MG tablet Take 1,000 mg by mouth daily.   Yes [provider]  buPROPion (WELLBUTRIN SR) 200 MG 12 hr tablet Take 1 tablet (200 mg total) by mouth daily. 11/12/20  Yes Whitmire, Dawn W, FNP  DULoxetine (CYMBALTA) 30 MG capsule Take 30 mg by mouth daily. Takes with 60mg  capsule to total 90mg  daily.   Yes [provider]  DULoxetine (CYMBALTA) 60 MG capsule Take 60 mg by mouth daily. Takes with 30mg  dose for a total dose of 90mg  daily.   Yes [provider]  gabapentin (NEURONTIN) 400 MG capsule Take 400 mg by mouth 3 (three) times daily. 01/01/20  Yes [provider]  metoprolol tartrate (LOPRESSOR) 25 MG  tablet TAKE 1/2 TABLET(12.5 MG TOTAL) BY MOUTH TWICE DAILY Patient taking differently: Take 12.5 mg by mouth 2 (two) times daily. 03/14/20  Yes Allred, Fayrene Fearing, MD  traMADol (ULTRAM) 50 MG tablet Take 1 tablet (50 mg total) by mouth every 6 (six) hours as needed. Patient taking differently: Take 50 mg by mouth every 6 (six) hours as needed for moderate pain. 02/09/21  Yes Vanetta Mulders, MD  triamterene-hydrochlorothiazide (MAXZIDE-25) 37.5-25 MG tablet Take 1 tablet by mouth every morning.  12/27/19  Yes [provider]  XARELTO 20 MG TABS tablet Take 1 tablet (20 mg total) by mouth daily with supper. 12/25/20  Yes  Allred, Fayrene Fearing, MD    Inpatient Medications: Scheduled Meds: . [START ON 02/24/2021] vitamin C  1,000 mg Oral Daily  . [START ON 02/24/2021] buPROPion  200 mg Oral Daily  . [START ON 02/24/2021] DULoxetine  30 mg Oral Daily  . gabapentin  400 mg Oral TID  . metoprolol tartrate  12.5 mg Oral BID  . [START ON 02/24/2021] rivaroxaban  20 mg Oral Q supper  . [START ON 02/24/2021] triamterene-hydrochlorothiazide  1 tablet Oral q morning   Continuous Infusions: . lactated ringers 50 mL/hr at 02/23/21 2043   PRN Meds: acetaminophen, melatonin, morphine injection, ondansetron (ZOFRAN) IV, polyethylene glycol, traMADol  Allergies:    Allergies  Allergen Reactions  . Amlodipine Besylate     Other reaction(s): edema at 10 mg dose  . Codeine Hives  . Latex   . Triamcinolone     Other reaction(s): nausea    Social History:   Social History   Socioeconomic History  . Marital status: Married    Spouse name: TERRY  . Number of children: 2  . Years of education: Not on file  . Highest education level: Not on file  Occupational History  . Occupation: Disabled  Tobacco Use  . Smoking status: Former Smoker    Packs/day: 1.00    Years: 10.00    Pack years: 10.00    Types: Cigarettes    Quit date: 10/28/1987    Years since quitting: 33.3  . Smokeless tobacco: Never Used  Substance and Sexual Activity  . Alcohol use: Yes    Alcohol/week: 4.0 standard drinks    Types: 4 Glasses of wine per week    Comment: recently quit  . Drug use: Yes    Types: Marijuana    Comment: 04/23/2016 "recreational marijuana in my late teens/early 20's"  . Sexual activity: Not Currently  Other Topics Concern  . Not on file  Social History Narrative   Lives in [place] with husband and 3 grandchildren.   Self employed office cleaner.   Social Determinants of Health   Financial Resource Strain: Not on file  Food Insecurity: Not on file  Transportation Needs: Not on file  Physical Activity: Not on file   Stress: Not on file  Social Connections: Not on file  Intimate Partner Violence: Not on file    Family History:    Family History  Problem Relation Age of Onset  . Diabetes Mellitus I Father   . CVA Father   . Arthritis Mother   . Obesity Mother   . Thyroid disease Mother   . Liver disease Mother   . CAD Maternal Grandfather      ROS:  Please see the history of present illness.   All other ROS reviewed and negative.     Physical Exam/Data:   Vitals:   02/23/21 2000 02/23/21 2015 02/23/21  2033 02/23/21 2120  BP: (!) 131/98 (!) 141/84 (!) 115/93 (!) 147/88  Pulse: 70 63 65 (!) 57  Resp: (!) 22 19 (!) 21 18  Temp:   98.1 F (36.7 C) 97.8 F (36.6 C)  TempSrc:   Oral Oral  SpO2: 96% 97% 96% 99%  Weight:    (!) 151 kg    Intake/Output Summary (Last 24 hours) at 02/23/2021 2334 Last data filed at 02/23/2021 2200 Gross per 24 hour  Intake 57.52 ml  Output 200 ml  Net -142.48 ml   Last 3 Weights 02/23/2021 02/14/2021 02/09/2021  Weight (lbs) 332 lb 14.4 oz 337 lb 3.2 oz 332 lb  Weight (kg) 151.002 kg 152.953 kg 150.594 kg     Body mass index is 60.89 kg/m.  General:  nad Neck: no JVD Vascular: No carotid bruits; FA pulses 2+ bilaterally without bruits  Cardiac:  normal S1, S2; RRR; no murmur  Lungs:  clear to auscultation bilaterally, no wheezing, rhonchi or rales  Abd: soft, nontender, no hepatomegaly  Ext: no edema Musculoskeletal:  No deformities, BUE and BLE strength normal and equal Skin: warm and dry  Neuro:  CNs 2-12 intact, no focal abnormalities noted Psych:  Normal affect   EKG:  The EKG was personally reviewed and demonstrates:  afib Telemetry:  Telemetry was personally reviewed and demonstrates:  afib  Relevant CV Studies: none  Laboratory Data:  High Sensitivity Troponin:   Recent Labs  Lab 02/09/21 1340 02/09/21 1526 02/23/21 1524 02/23/21 1930  TROPONINIHS 3 3 4 4      Chemistry Recent Labs  Lab 02/23/21 1524  NA 139  K 4.4  CL  105  CO2 26  GLUCOSE 73  BUN 21  CREATININE 1.02*  CALCIUM 9.1  GFRNONAA >60  ANIONGAP 8    No results for input(s): PROT, ALBUMIN, AST, ALT, ALKPHOS, BILITOT in the last 168 hours. Hematology Recent Labs  Lab 02/23/21 1524  WBC 6.1  RBC 4.94  HGB 12.2  HCT 40.1  MCV 81.2  MCH 24.7*  MCHC 30.4  RDW 16.1*  PLT 288   BNPNo results for input(s): BNP, PROBNP in the last 168 hours.  DDimer No results for input(s): DDIMER in the last 168 hours.   Radiology/Studies:  DG Chest 2 View  Result Date: 02/23/2021 CLINICAL DATA:  Chest pain. Pain under LEFT breast radiating to LEFT neck and LEFT arm for several weeks, worsening last night. History of atrial fibrillation. EXAM: CHEST - 2 VIEW COMPARISON:  Chest x-rays dated 02/09/2021 and 02/09/2020. FINDINGS: Stable cardiomegaly. Probable mild atelectasis at the RIGHT lung base. Lungs otherwise clear. No pleural effusion or pneumothorax is seen. Osseous structures about the chest are unremarkable. IMPRESSION: 1. Probable mild atelectasis at the RIGHT lung base. Lungs otherwise clear. 2. Stable cardiomegaly. Electronically Signed   By: Bary RichardStan  Maynard M.D.   On: 02/23/2021 15:46     Assessment and Plan:   1. Chest pain. Despite the symptoms seemingly like typical cardiac chest pain, she has no ECG signs or laboratory data to suggest acute ischemia. Could consider getting her coronary CTa here in house since she is admitted for observation, however recommend medicine admission for further exploration as to other etiologies of her pain. Cardiology will continue to follow. Check lipid panel, tsh, a1c.    Risk Assessment/Risk Scores:     HEAR Score (for undifferentiated chest pain):  HEAR Score: 4          For questions or updates, please contact  [facility] Please consult [email or web address] for contact info under    Signed, Joellen Jersey, MD  02/23/2021 11:34 PM

## 2021-02-23 NOTE — H&P (Addendum)
History and Physical  Abigail Durhamamela H Koch ZOX:[id number] DOB: 24-Sep-1956 DOA: 02/23/2021  Referring physician: Jodi GeraldsKelsey, Ford, PA, EDP PCP: Laurann MontanaWhite, Cynthia, MD  Outpatient Specialists: Cardiology. Patient coming from: Home.  Chief Complaint: Chest pain intermittently x3 weeks  HPI: [name] is a 65 y.o. female with medical history significant for persistent A. fib on Xarelto, DVT, essential hypertension, chronic anxiety/depression, who [facility] from home due to worsening intermittent chest pain.  Associated with dyspnea, located below her left breast, tightness, radiating to her left arm and left neck.  Worse with exertion.  Patient has had a extensive work-up in the past for the same, was nonrevealing.  Has been followed by cardiology outpatient.  She has a scheduled coronary CTA but could not wait to get it done due to persistent intermittent chest pain.  [facility]  Work-up in the ED has been unrevealing.  Twelve-lead EKG no evidence of acute ischemia, troponin negative x2.  She was given aspirin and Nitropaste with improvement of her chest pain with about 80% reduction.  EDP discussed with cardiology fellow on-call who recommended admission for observation and to restart her home Xarelto.  TRH, hospitalist team, was asked to admit.  ED Course:  Afebrile.  BP 121/68, pulse 58, respirations 16, O2 saturation 98% on room air.  Lab studies remarkable for negative troponin 4.  Rest of labs essentially unremarkable.  Twelve-lead EKG showing rate controlled A. fib with rate of 64, nonspecific ST-T changes.  QTc 398.  Review of Systems: Review of systems as noted in the HPI. All other systems reviewed and are negative.   Past Medical History:  Diagnosis Date  . Atrial fibrillation (HCC)   . Chronic lower back pain   . Depression   . DVT (deep venous thrombosis) (HCC) 1990s   LLE  . Fallen arches   . Hypertension   . Hyperthyroidism ~ 2000   "fine  now" (04/23/2016)  . Joint pain   . Lactose intolerance   . Leg edema   . Obesity   . Pneumonia 1980s X 1  . Snoring    has not had sleep study but suspects sleep apnea   Past Surgical History:  Procedure Laterality Date  . CARDIOVERSION N/A 06/18/2016   Procedure: CARDIOVERSION;  Surgeon: Laurey Moralealton S McLean, MD;  [facility];  Service: Cardiovascular;  Laterality: N/A;  . CARDIOVERSION N/A 07/11/2016   Procedure: CARDIOVERSION;  Surgeon: Lewayne BuntingBrian S Crenshaw, MD;  [facility];  Service: Cardiovascular;  Laterality: N/A;  . CARDIOVERSION N/A 07/02/2017   Procedure: CARDIOVERSION;  Surgeon: Thurmon Fairroitoru, Mihai, MD;  Location: [facility] ENDOSCOPY;  Service: Cardiovascular;  Laterality: N/A;  . CARDIOVERSION N/A 09/29/2017   Procedure: CARDIOVERSION;  Surgeon: Laurey MoraleMcLean, Dalton S, MD;  [facility];  Service: Cardiovascular;  Laterality: N/A;  . ENDOMETRIAL ABLATION  ~2006  . TONSILLECTOMY  1960s  . TUBAL LIGATION  1979    Social History:  reports that she quit smoking about 33 years ago. Her smoking use included cigarettes. She has a 10.00 pack-year smoking history. She has never used smokeless tobacco. She reports current alcohol use of about 4.0 standard drinks of alcohol per week. She reports current drug use. Drug: Marijuana.   Allergies  Allergen Reactions  . Amlodipine Besylate     Other reaction(s): edema at 10 mg dose  . Codeine Hives  . Latex   . Triamcinolone     Other reaction(s): nausea    Family History  Problem Relation Age of Onset  . Diabetes Mellitus I Father   . CVA Father   . Arthritis Mother   . Obesity Mother   . Thyroid disease Mother   . Liver disease Mother   . CAD Maternal Grandfather       Prior to Admission medications   Medication Sig Start Date End Date Taking? Authorizing Provider  acetaminophen (TYLENOL) 325 MG tablet Take 2 tablets by mouth every 4 (four) hours as needed for pain.   Yes [provider]  Ascorbic Acid  (VITAMIN C) 1000 MG tablet Take 1,000 mg by mouth daily.   Yes [provider]  buPROPion (WELLBUTRIN SR) 200 MG 12 hr tablet Take 1 tablet (200 mg total) by mouth daily. 11/12/20  Yes Whitmire, Dawn W, FNP  DULoxetine (CYMBALTA) 30 MG capsule Take 30 mg by mouth daily. Takes with 60mg  capsule to total 90mg  daily.   Yes [provider]  DULoxetine (CYMBALTA) 60 MG capsule Take 60 mg by mouth daily. Takes with 30mg  dose for a total dose of 90mg  daily.   Yes [provider]  gabapentin (NEURONTIN) 400 MG capsule Take 400 mg by mouth 3 (three) times daily. 01/01/20  Yes [provider]  metoprolol tartrate (LOPRESSOR) 25 MG tablet TAKE 1/2 TABLET(12.5 MG TOTAL) BY MOUTH TWICE DAILY Patient taking differently: Take 12.5 mg by mouth 2 (two) times daily. 03/14/20  Yes Allred, , MD  traMADol (ULTRAM) 50 MG tablet Take 1 tablet (50 mg total) by mouth every 6 (six) hours as needed. Patient taking differently: Take 50 mg by mouth every 6 (six) hours as needed for moderate pain. 02/09/21  Yes 03/02/20, MD  triamterene-hydrochlorothiazide (MAXZIDE-25) 37.5-25 MG tablet Take 1 tablet by mouth every morning.  12/27/19  Yes [provider]  XARELTO 20 MG TABS tablet Take 1 tablet (20 mg total) by mouth daily with supper. 12/25/20  Yes Allred, Vanetta Mulders, MD    Physical Exam: BP (!) 130/104   Pulse 66   Temp 98.6 F (37 C) (Oral)   Resp 20   SpO2 99%   . General: 65 y.o. year-old female well developed well nourished in no acute distress.  Alert and oriented x3. . Cardiovascular: Irregular rate and rhythm with no rubs or gallops.  No thyromegaly or JVD noted.  Trace lower extremity edema bilaterally. 02/26/20 Respiratory: Clear to auscultation with no wheezes or rales. Good inspiratory effort. . Abdomen: Soft nontender nondistended with normal bowel sounds x4 quadrants. . Muskuloskeletal: No cyanosis or clubbing.  Trace lower extremity edema bilaterally.  . Neuro: CN  II-XII intact, strength, sensation, reflexes . Skin: No ulcerative lesions noted or rashes . Psychiatry: Judgement and insight appear normal. Mood is appropriate for condition and setting          Labs on Admission:  Basic Metabolic Panel: Recent Labs  Lab 02/23/21 1524  NA 139  K 4.4  CL 105  CO2 26  GLUCOSE 73  BUN 21  CREATININE 1.02*  CALCIUM 9.1   Liver Function Tests: No results for input(s): AST, ALT, ALKPHOS, BILITOT, PROT, ALBUMIN in the last 168 hours. No results for input(s): LIPASE, AMYLASE in the last 168 hours. No results for input(s): AMMONIA in the last 168 hours. CBC: Recent Labs  Lab 02/23/21 1524  WBC 6.1  HGB 12.2  HCT 40.1  MCV 81.2  PLT 288   Cardiac Enzymes: No results for input(s): CKTOTAL, CKMB, CKMBINDEX, TROPONINI in the last 168 hours.  BNP (  last 3 results) No results for input(s): BNP in the last 8760 hours.  ProBNP (last 3 results) No results for input(s): PROBNP in the last 8760 hours.  CBG: No results for input(s): GLUCAP in the last 168 hours.  Radiological Exams on Admission: DG Chest 2 View  Result Date: 02/23/2021 CLINICAL DATA:  Chest pain. Pain under LEFT breast radiating to LEFT neck and LEFT arm for several weeks, worsening last night. History of atrial fibrillation. EXAM: CHEST - 2 VIEW COMPARISON:  Chest x-rays dated 02/09/2021 and 02/09/2020. FINDINGS: Stable cardiomegaly. Probable mild atelectasis at the RIGHT lung base. Lungs otherwise clear. No pleural effusion or pneumothorax is seen. Osseous structures about the chest are unremarkable. IMPRESSION: 1. Probable mild atelectasis at the RIGHT lung base. Lungs otherwise clear. 2. Stable cardiomegaly. Electronically Signed   By: Bary Richard M.D.   On: 02/23/2021 15:46    EKG: I independently viewed the EKG done and my findings are as followed:  A. fib rate of 64, nonspecific ST-T changes.  QTc 398.   Assessment/Plan Present on Admission: . Chest pain  Active  Problems:   Chest pain  Atypical chest pain rule out ACS Patient presents with intermittent chest pain of 3 weeks duration. Located below her left breast, radiating into her left arm and left neck, worse with exertion, tightness, associated with dyspnea. Reports compliant with Xarelto and her other home medications. She has had an extensive work-up in the past which has been unremarkable. Follows with cardiology outpatient with plan for coronary CTA EDP contacted cardiology fellow on-call recommended admission for observation and to continue home Xarelto. Received Nitropaste in the ED with improvement of chest pain with 80% reduction. Obtain fasting lipid panel in the morning Start gentle IV fluid hydration LR 50 cc/h x 2 days in anticipation for CTA coronaries. Monitor on telemetry Last 2D echo on 02/23/2020 showing normal LVEF 55 to 60% with no regional wall motion abnormalities, there was moderate left ventricular hypertrophy, left atrial size was severely dilated, the mitral valve was abnormal with mild to moderate mitral valve regurgitation, there was mild dilatation of the ascending aorta measuring 42 mm. Management per cardiology.  Permanent A. fib, rate controlled Restart home Lopressor 12.5 mg twice daily Restart home Xarelto for primary CVA prevention as recommended by cardiology.  Chronic anxiety/depression Resume home regimen  Essential hypertension BP is at goal Resume home regimen  Severe morbid obesity Weight 153 kg Will benefit from weight loss outpatient with healthy dieting and regular physical activity.  Ambulatory dysfunction Uses a cane in order to ambulate PT OT to assess Fall precautions.  Ascending aortic aneurysm 4.2 cm seen on CTA done on 02/09/2021 Recommended annual imaging follow-up by CTA or MRA. Management per cardiology.  Aortic atherosclerosis Follow fasting lipid panel Management per cardiology.    DVT prophylaxis: Xarelto.  Code  Status: Full code as stated by the patient herself.  Her daughter at bedside.  Family Communication: Daughter at bedside.  Reviewed the plan of care with the patient and her daughter at bedside.  Disposition Plan: Admit to telemetry cardiac unit.  Consults called: Cardiology contacted by EDP.  Admission status: Observation status.   Status is: Observation    Dispo: The patient is from: Home.               Anticipated d/c is to: Home, possibly on 02/24/2021 or when cardiology signs off.              Patient currently not  stable for discharge due to ongoing management of chest pain, rule out ACS.   Difficult to place patient, not applicable.       Darlin Drop MD [facility] Pager [phone]  If 7PM-7AM, please contact night-coverage [email or web address] Password [facility]  02/23/2021, 7:55 PM

## 2021-02-23 NOTE — ED Triage Notes (Signed)
Emergency Medicine Provider Triage Evaluation Note  [name] , a 65 y.o. female  was evaluated in triage.  Pt complains of left upper back pain radiating around left side towards chest for 2 weeks.   Review of Systems  Positive: Left upper back pain Negative: Cough, fever, sob.   Physical Exam  BP (!) 124/108 (BP Location: Right Arm)   Pulse 62   Temp 98.6 F (37 C) (Oral)   Resp 16   SpO2 98%  Gen:   Awake, no distress   HEENT:  Atraumatic  Resp:  Normal effort  Cardiac:  Normal rate  Abd:   Nondistended, nontender  MSK:   C/T spine non tender, aligned. Left thoracic/upper back muscular tenderness.  Neuro:  LUE motor/sens grossly intact.   Medical Decision Making  Medically screening exam initiated at 4:46 PM.  Appropriate orders placed.  Aaron Edelman Mackowiak was informed that the remainder of the evaluation will be completed by another provider, this initial triage assessment does not replace that evaluation, and the importance of remaining in the ED until their evaluation is complete.  Clinical Impression  Labs ordered from triage. Pt notes recent eval for same. ?possible nerve impingement, ddd, msk pain. Will move to treatment room for completion of evaluation.    Cathren Laine, MD 02/23/21 [phone]

## 2021-02-23 NOTE — ED Provider Notes (Signed)
[facility] EMERGENCY DEPARTMENT Provider Note   CSN: [id number] Arrival date & time: 02/23/21  1459     History Chief Complaint  Patient presents with  . Chest Pain    [name] is a 65 y.o. female.  [name] is a 65 y.o. female with a history of HTN, DVT, hyperthyroidism, Afib, chronic back pain, obesity and depression, who presents to the ED for evaluation of continued and worsening chest pain. Was evaluated in ED on 4/16 for similar chest pain and despite reassuring workup with normal enzymes, and normal CTA of the chest and neck, she continues to have worsening symptoms, called cardiology office and was told to return to ED. Patient reports pain starts in the center of the chest and extend across the left lower chest, she also feels like it sometimes goes straight back through her back.  She then intermittently feels pain going up the left side of her neck and down her left arm.  She reports this pain is worse with exertion.  Over the past 2 weeks she has had a constant dull ache in the center and left side of her chest, but with activity this pain gets worse and starts to radiate more into her arm and neck and she also feels short of breath and sometimes lightheaded.  No nausea or vomiting, no abdominal pain.  No meds prior to arrival, is currently on Xarelto for anticoagulation        Past Medical History:  Diagnosis Date  . Atrial fibrillation (HCC)   . Chronic lower back pain   . Depression   . DVT (deep venous thrombosis) (HCC) 1990s   LLE  . Fallen arches   . Hypertension   . Hyperthyroidism ~ 2000   "fine now" (04/23/2016)  . Joint pain   . Lactose intolerance   . Leg edema   . Obesity   . Pneumonia 1980s X 1  . Snoring    has not had sleep study but suspects sleep apnea    Patient Active Problem List   Diagnosis Date Noted  . Insulin resistance 05/11/2020  . DOE (dyspnea on exertion) 02/09/2020  . Depression 09/26/2019  .  Vitamin D deficiency 09/01/2019  . Class 3 severe obesity with serious comorbidity and body mass index (BMI) of 50.0 to 59.9 in adult (HCC) 09/01/2019  . Prediabetes 08/15/2019  . Atrial fibrillation (HCC)   . Chest pain 04/22/2016  . Hypokalemia 04/22/2016  . Morbid obesity (HCC) 04/22/2016  . Hypertension 04/22/2016  . Paroxysmal A-fib (HCC) 04/22/2016  . Hypomagnesemia 04/22/2016    Past Surgical History:  Procedure Laterality Date  . CARDIOVERSION N/A 06/18/2016   Procedure: CARDIOVERSION;  Surgeon: Laurey Morale, MD;  Location: [facility] ENDOSCOPY;  Service: Cardiovascular;  Laterality: N/A;  . CARDIOVERSION N/A 07/11/2016   Procedure: CARDIOVERSION;  Surgeon: Lewayne Bunting, MD;  Location: [facility] ENDOSCOPY;  Service: Cardiovascular;  Laterality: N/A;  . CARDIOVERSION N/A 07/02/2017   Procedure: CARDIOVERSION;  Surgeon: Thurmon Fair, MD;  Location: [facility] ENDOSCOPY;  Service: Cardiovascular;  Laterality: N/A;  . CARDIOVERSION N/A 09/29/2017   Procedure: CARDIOVERSION;  Surgeon: Laurey Morale, MD;  Location: [facility] ENDOSCOPY;  Service: Cardiovascular;  Laterality: N/A;  . ENDOMETRIAL ABLATION  ~2006  . TONSILLECTOMY  1960s  . TUBAL LIGATION  1979     OB History    Gravida  2   Para      Term      Preterm  AB      Living  2     SAB      IAB      Ectopic      Multiple      Live Births              Family History  Problem Relation Age of Onset  . Diabetes Mellitus I Father   . CVA Father   . Arthritis Mother   . Obesity Mother   . Thyroid disease Mother   . Liver disease Mother   . CAD Maternal Grandfather     Social History   Tobacco Use  . Smoking status: Former Smoker    Packs/day: 1.00    Years: 10.00    Pack years: 10.00    Types: Cigarettes    Quit date: 10/28/1987    Years since quitting: 33.3  . Smokeless tobacco: Never Used  Substance Use Topics  . Alcohol use: Yes    Alcohol/week: 4.0 standard drinks    Types: 4 Glasses of wine per week     Comment: recently quit  . Drug use: Yes    Types: Marijuana    Comment: 04/23/2016 "recreational marijuana in my late teens/early 20's"    Home Medications Prior to Admission medications   Medication Sig Start Date End Date Taking? Authorizing Provider  acetaminophen (TYLENOL) 325 MG tablet Take 2 tablets by mouth as needed for pain.    [provider]  Ascorbic Acid (VITAMIN C) 1000 MG tablet Take 1,000 mg by mouth daily.    [provider]  buPROPion (WELLBUTRIN SR) 200 MG 12 hr tablet Take 1 tablet (200 mg total) by mouth daily. 11/12/20   Whitmire, Thermon Leyland, FNP  DULoxetine (CYMBALTA) 30 MG capsule Take 90 mg by mouth daily.    [provider]  DULoxetine (CYMBALTA) 60 MG capsule Take 60 mg by mouth daily. 12/27/19   [provider]  gabapentin (NEURONTIN) 400 MG capsule Take 400 mg by mouth 3 (three) times daily. 01/01/20   [provider]  metoprolol tartrate (LOPRESSOR) 100 MG tablet Take 1 tablet (100 mg total) by mouth once for 1 dose. 2 hours prior to procedure 02/14/21 02/14/21  Sheilah Pigeon, PA-C  metoprolol tartrate (LOPRESSOR) 25 MG tablet TAKE 1/2 TABLET(12.5 MG TOTAL) BY MOUTH TWICE DAILY 03/14/20   Allred, Fayrene Fearing, MD  traMADol (ULTRAM) 50 MG tablet Take 1 tablet (50 mg total) by mouth every 6 (six) hours as needed. 02/09/21   Vanetta Mulders, MD  triamterene-hydrochlorothiazide (MAXZIDE-25) 37.5-25 MG tablet Take 1 tablet by mouth every morning.  12/27/19   [provider]  XARELTO 20 MG TABS tablet Take 1 tablet (20 mg total) by mouth daily with supper. 12/25/20   Allred, Fayrene Fearing, MD    Allergies    Amlodipine besylate, Codeine, and Triamcinolone  Review of Systems   Review of Systems  Constitutional: Negative for chills, diaphoresis and fever.  HENT: Negative.   Respiratory: Positive for shortness of breath. Negative for cough.   Cardiovascular: Positive for chest pain. Negative for palpitations and leg swelling.   Gastrointestinal: Negative for abdominal pain, nausea and vomiting.  Genitourinary: Negative for dysuria.  Musculoskeletal: Positive for neck pain. Negative for arthralgias and myalgias.  Skin: Negative for color change and rash.  Neurological: Positive for light-headedness. Negative for dizziness and syncope.  All other systems reviewed and are negative.   Physical Exam Updated Vital Signs BP (!) 124/108 (BP Location: Right Arm)   Pulse  62   Temp 98.6 F (37 C) (Oral)   Resp 16   SpO2 98%   Physical Exam Vitals and nursing note reviewed.  Constitutional:      General: She is not in acute distress.    Appearance: She is well-developed. She is obese. She is not ill-appearing or diaphoretic.  HENT:     Head: Normocephalic and atraumatic.  Eyes:     General:        Right eye: No discharge.        Left eye: No discharge.     Pupils: Pupils are equal, round, and reactive to light.  Cardiovascular:     Rate and Rhythm: Normal rate.     Pulses:          Radial pulses are 2+ on the right side and 2+ on the left side.       Dorsalis pedis pulses are 2+ on the right side and 2+ on the left side.     Heart sounds: Normal heart sounds. No murmur heard. No friction rub. No gallop.      Comments: Irregularly irregular  Pulmonary:     Effort: Pulmonary effort is normal. No respiratory distress.     Breath sounds: Normal breath sounds. No wheezing or rales.     Comments: Respirations equal and unlabored, patient able to speak in full sentences, lungs clear to auscultation bilaterally  Chest:     Chest wall: No tenderness.     Comments: Pain is not reproducible with palpation Abdominal:     General: Bowel sounds are normal. There is no distension.     Palpations: Abdomen is soft. There is no mass.     Tenderness: There is no abdominal tenderness. There is no guarding.     Comments: Abdomen soft, nondistended, nontender to palpation in all quadrants without guarding or peritoneal  signs  Musculoskeletal:        General: No deformity.     Cervical back: Neck supple.     Right lower leg: No tenderness. No edema.     Left lower leg: No tenderness. No edema.  Skin:    General: Skin is warm and dry.     Capillary Refill: Capillary refill takes less than 2 seconds.  Neurological:     Mental Status: She is alert and oriented to person, place, and time.     Coordination: Coordination normal.     Comments: Speech is clear, able to follow commands Moves extremities without ataxia, coordination intact  Psychiatric:        Mood and Affect: Mood normal.        Behavior: Behavior normal.     ED Results / Procedures / Treatments   Labs (all labs ordered are listed, but only abnormal results are displayed) Labs Reviewed  BASIC METABOLIC PANEL - Abnormal; Notable for the following components:      Result Value   Creatinine, Ser 1.02 (*)    All other components within normal limits  CBC - Abnormal; Notable for the following components:   MCH 24.7 (*)    RDW 16.1 (*)    All other components within normal limits  TROPONIN I (HIGH SENSITIVITY)  TROPONIN I (HIGH SENSITIVITY)    EKG EKG Interpretation  Date/Time:  Saturday February 23 2021 15:11:35 EDT Ventricular Rate:  64 PR Interval:    QRS Duration: 96 QT Interval:  386 QTC Calculation: 398 R Axis:   70 Text Interpretation: Atrial fibrillation Nonspecific ST and T  wave abnormality PVC noted, NO STEMI No significant change since last tracing February 09 2021 Confirmed by Alvester Chou [phone]) on 02/23/2021 5:45:08 PM   Radiology DG Chest 2 View  Result Date: 02/23/2021 CLINICAL DATA:  Chest pain. Pain under LEFT breast radiating to LEFT neck and LEFT arm for several weeks, worsening last night. History of atrial fibrillation. EXAM: CHEST - 2 VIEW COMPARISON:  Chest x-rays dated 02/09/2021 and 02/09/2020. FINDINGS: Stable cardiomegaly. Probable mild atelectasis at the RIGHT lung base. Lungs otherwise clear. No  pleural effusion or pneumothorax is seen. Osseous structures about the chest are unremarkable. IMPRESSION: 1. Probable mild atelectasis at the RIGHT lung base. Lungs otherwise clear. 2. Stable cardiomegaly. Electronically Signed   By: Bary Richard M.D.   On: 02/23/2021 15:46    Procedures Procedures   Medications Ordered in ED Medications  acetaminophen (TYLENOL) tablet 650 mg (650 mg Oral Given 02/24/21 0724)  ondansetron (ZOFRAN) injection 4 mg (has no administration in time range)  melatonin tablet 3 mg (3 mg Oral Given 02/23/21 2140)  buPROPion (WELLBUTRIN SR) 12 hr tablet 200 mg (200 mg Oral Given 02/24/21 0907)  ascorbic acid (VITAMIN C) tablet 1,000 mg (1,000 mg Oral Given 02/24/21 0906)  gabapentin (NEURONTIN) capsule 400 mg (400 mg Oral Given 02/24/21 0907)  DULoxetine (CYMBALTA) DR capsule 30 mg (30 mg Oral Given 02/24/21 0907)  metoprolol tartrate (LOPRESSOR) tablet 12.5 mg (12.5 mg Oral Given 02/24/21 0907)  rivaroxaban (XARELTO) tablet 20 mg (has no administration in time range)  triamterene-hydrochlorothiazide (MAXZIDE-25) 37.5-25 MG per tablet 1 tablet (1 tablet Oral Given 02/24/21 0908)  traMADol (ULTRAM) tablet 50 mg (50 mg Oral Given 02/24/21 1040)  lactated ringers infusion ( Intravenous New Bag/Given 02/23/21 2043)  morphine 2 MG/ML injection 2 mg (has no administration in time range)  polyethylene glycol (MIRALAX / GLYCOLAX) packet 17 g (has no administration in time range)  hydrocortisone cream 1 % ( Topical Given 02/24/21 0732)  aspirin chewable tablet 324 mg (324 mg Oral Given 02/23/21 1921)  nitroGLYCERIN (NITROGLYN) 2 % ointment 1 inch (1 inch Topical Given 02/23/21 1921)    ED Course  I have reviewed the triage vital signs and the nursing notes.  Pertinent labs & imaging results that were available during my care of the patient were reviewed by me and considered in my medical decision making (see chart for details).   MDM Rules/Calculators/A&P                         Patient  presents to the emergency department with chest pain. Patient nontoxic appearing, in no apparent distress, vitals without significant abnormality.  This pain is not reproducible with palpation or range of motion.  Pain is made worse with exertion.  With exertion patient also becomes increasingly short of breath.  Patient's primary cardiologist: Dr. Hillis Range No prior heart cath, but patient had Myoview stress test on 02/22/2020 which was low risk and showed normal EF.  DDX: ACS, pulmonary embolism, dissection, pneumothorax, pneumonia, arrhythmia, severe anemia, MSK, GERD, anxiety, abdominal process.   I am concerned for potential cardiac etiology for this chest pain.  Patient was given an aspirin and Nitropaste for pain.  She will remain on cardiac monitor.  Patient was evaluated for similar pain 2 weeks ago and had reassuring evaluation, despite this and symptomatic treatment she is continued to have worsening pain.  Additional history obtained:  Additional history obtained from chart review & nursing note review.  EKG: Rate controlled A. fib with some nonspecific ST changes, but no significant change when compared to prior EKG.  Lab Tests:  I Ordered, reviewed, and interpreted labs, which included:  CBC: No leukocytosis, normal hemoglobin BMP: No electrolyte derangements, creatinine of 1.02, at baseline  Troponin: Initial troponin not elevated at 4.  Imaging Studies ordered:  I ordered imaging studies which included CXR, I independently reviewed, formal radiology impression shows:  Probable mild atelectasis at the right lung base but otherwise no active cardiopulmonary disease, stable cardiomegaly.  ED Course:   Given persistent pain that is worse with exertion concern for potential cardiac etiology, potential unstable angina.  Patient's EKG without changes today and initial cardiac enzymes are negative, patient did improve with aspirin and Nitropaste though.  Will consult cardiology  feel patient will need to be admitted for further evaluation of this chest pain.  Case discussed with cardiology fellow, he will see patient in consult, but request medicine admission to help explore other potential etiologies for chest pain as well.  Case discussed with Dr. Raphael Gibney with [facility] hospitalist who will see and admit the patient.   Portions of this note were generated with Scientist, clinical (histocompatibility and immunogenetics). Dictation errors may occur despite best attempts at proofreading.  Final Clinical Impression(s) / ED Diagnoses Final diagnoses:  Exertional chest pain    Rx / DC Orders ED Discharge Orders    None       Dartha Lodge, [place] 02/24/21 1213    Terald Sleeper, MD 02/24/21 1310

## 2021-02-24 DIAGNOSIS — F419 Anxiety disorder, unspecified: Secondary | ICD-10-CM | POA: Diagnosis not present

## 2021-02-24 DIAGNOSIS — I10 Essential (primary) hypertension: Secondary | ICD-10-CM

## 2021-02-24 DIAGNOSIS — F32A Depression, unspecified: Secondary | ICD-10-CM

## 2021-02-24 DIAGNOSIS — R079 Chest pain, unspecified: Secondary | ICD-10-CM | POA: Diagnosis not present

## 2021-02-24 DIAGNOSIS — I4821 Permanent atrial fibrillation: Secondary | ICD-10-CM

## 2021-02-24 LAB — HEPATIC FUNCTION PANEL
ALT: 15 U/L (ref 0–44)
AST: 16 U/L (ref 15–41)
Albumin: 3.3 g/dL — ABNORMAL LOW (ref 3.5–5.0)
Alkaline Phosphatase: 42 U/L (ref 38–126)
Bilirubin, Direct: <0.1 mg/dL (ref 0.0–0.2)
Total Bilirubin: 0.7 mg/dL (ref 0.3–1.2)
Total Protein: 6 g/dL — ABNORMAL LOW (ref 6.5–8.1)

## 2021-02-24 LAB — LIPID PANEL
Cholesterol: 163 mg/dL (ref 0–200)
HDL: 72 mg/dL (ref >40)
LDL Cholesterol: 82 mg/dL (ref 0–99)
Total CHOL/HDL Ratio: 2.3 RATIO
Triglycerides: 47 mg/dL (ref <150)
VLDL: 9 mg/dL (ref 0–40)

## 2021-02-24 LAB — BASIC METABOLIC PANEL
Anion gap: 8 (ref 5–15)
BUN: 18 mg/dL (ref 8–23)
CO2: 27 mmol/L (ref 22–32)
Calcium: 8.8 mg/dL — ABNORMAL LOW (ref 8.9–10.3)
Chloride: 103 mmol/L (ref 98–111)
Creatinine, Ser: 1.03 mg/dL — ABNORMAL HIGH (ref 0.44–1.00)
GFR, Estimated: >60 mL/min (ref >60)
Glucose, Bld: 84 mg/dL (ref 70–99)
Potassium: 4.2 mmol/L (ref 3.5–5.1)
Sodium: 138 mmol/L (ref 135–145)

## 2021-02-24 LAB — CBC
HCT: 36.7 % (ref 36.0–46.0)
Hemoglobin: 11.2 g/dL — ABNORMAL LOW (ref 12.0–15.0)
MCH: 24.8 pg — ABNORMAL LOW (ref 26.0–34.0)
MCHC: 30.5 g/dL (ref 30.0–36.0)
MCV: 81.2 fL (ref 80.0–100.0)
Platelets: 250 10*3/uL (ref 150–400)
RBC: 4.52 MIL/uL (ref 3.87–5.11)
RDW: 16.2 % — ABNORMAL HIGH (ref 11.5–15.5)
WBC: 6.2 10*3/uL (ref 4.0–10.5)
nRBC: 0 % (ref 0.0–0.2)

## 2021-02-24 LAB — MAGNESIUM: Magnesium: 1.8 mg/dL (ref 1.7–2.4)

## 2021-02-24 LAB — PHOSPHORUS: Phosphorus: 3.8 mg/dL (ref 2.5–4.6)

## 2021-02-24 MED ORDER — HYDROCORTISONE 1 % EX CREA
TOPICAL_CREAM | Freq: Three times a day (TID) | CUTANEOUS | Status: DC | PRN
  Filled 2021-02-24: qty 28

## 2021-02-24 NOTE — Discharge Summary (Signed)
Physician Discharge Summary  [name] TDD:[id number] DOB: Jul 30, 1956 DOA: 02/23/2021  PCP: Abigail Montana, MD  Admit date: 02/23/2021 Discharge date: 02/24/2021  Time spent: 45 minutes  Recommendations for Outpatient Follow-up:  Patient will be discharged to home.  Patient will need to follow up with primary care provider within one week of discharge.  Follow-up with cardiology.  Patient should continue medications as prescribed.  Patient should follow a heart healhty diet.   Discharge Diagnoses:  Chest pain Permanent atrial fibrillation Chronic anxiety/depression Essential hypertension Severe morbid obesity Ascending aortic aneurysm Aortic atherosclerosis Headache  Discharge Condition: stable  Diet recommendation: heart healthy  Filed Weights   02/23/21 2120 02/24/21 0234  Weight: (!) 151 kg (!) 150.5 kg    History of present illness:  On 02/23/2021 by Dr. Jacklynn Koch is a 65 y.o. female with medical history significant for persistent A. fib on Xarelto, DVT, essential hypertension, chronic anxiety/depression, who presented to [facility] ED from home due to worsening intermittent chest pain.  Associated with dyspnea, located below her left breast, tightness, radiating to her left arm and left neck.  Worse with exertion.  Patient has had a extensive work-up in the past for the same, was nonrevealing.  Has been followed by cardiology outpatient.  She has a scheduled coronary CTA but could not wait to get it done due to persistent intermittent chest pain.  She initially presented to [facility] ED.  Work-up in the ED has been unrevealing.  Twelve-lead EKG no evidence of acute ischemia, troponin negative x2.  She was given aspirin and Nitropaste with improvement of her chest pain with about 80% reduction.  EDP discussed with cardiology fellow on-call who recommended admission for observation and to restart her home Xarelto.  TRH, hospitalist team, was asked to  admit.  Hospital Course:  Chest pain -Has been ongoing for 3 weeks and located under her left breast with radiation to her neck back and arm.  Associated with dyspnea as well as some nausea. -Improved with nitroglycerin -High-sensitivity troponin unremarkable x2 -Echocardiogram 02/23/2020 showed an EF 55 to 60%, no regional motion abnormalities, moderate LVH.  Diastolic function could not be evaluated. -Patient has a coronary CT scheduled for 02/26/2021 as an outpatient -Cardiology consulted and appreciated, no further work-up recommended at this point, patient can be discharged with outpatient follow-up  Permanent atrial fibrillation -Continue Xarelto, Lopressor  Chronic anxiety/depression -Continue home medications, Wellbutrin, Cymbalta  Essential hypertension -Continue metoprolol, Maxide  Severe morbid obesity -BMI 60.69 -Patient will need follow-up with her PCP to discuss lifestyle modifications  Ascending aortic aneurysm -Noted on CTA done on 02/09/2021, 4.2 cm -Patient to continue outpatient follow-up and surveillance  Aortic atherosclerosis -Continue treatment and management per cardiology  Headache -Likely secondary to nitroglycerin  Procedures: None  Consultations: Cardiology  Discharge Exam: Vitals:   02/24/21 0904 02/24/21 0907  BP: 127/68   Pulse:  64  Resp:    Temp:    SpO2:       General: Well developed, well nourished, NAD, appears stated age  HEENT: NCAT,  mucous membranes moist.  Cardiovascular: S1 S2 auscultated, irregular  Respiratory: Clear to auscultation bilaterally with equal chest rise  Abdomen: Soft, obese, nontender, nondistended, + bowel sounds  Extremities: warm dry without cyanosis clubbing or edema  Neuro: AAOx3, nonfocal  Psych: appropriate mood and affect  Discharge Instructions Discharge Instructions    Diet - low sodium heart healthy   Complete by: As directed    Discharge instructions  Complete by: As directed     Patient will need to follow up with primary care provider within one week of discharge.  Follow-up with cardiology.  Patient should continue medications as prescribed.  Patient should follow a heart healhty diet.   Increase activity slowly   Complete by: As directed      Allergies as of 02/24/2021      Reactions   Amlodipine Besylate    Other reaction(s): edema at 10 mg dose   Codeine Hives   Latex    Triamcinolone    Other reaction(s): nausea      Medication List    TAKE these medications   acetaminophen 325 MG tablet Commonly known as: TYLENOL Take 2 tablets by mouth every 4 (four) hours as needed for pain.   buPROPion 200 MG 12 hr tablet Commonly known as: WELLBUTRIN SR Take 1 tablet (200 mg total) by mouth daily.   DULoxetine 30 MG capsule Commonly known as: CYMBALTA Take 30 mg by mouth daily. Takes with 60mg  capsule to total 90mg  daily.   DULoxetine 60 MG capsule Commonly known as: CYMBALTA Take 60 mg by mouth daily. Takes with 30mg  dose for a total dose of 90mg  daily.   gabapentin 400 MG capsule Commonly known as: NEURONTIN Take 400 mg by mouth 3 (three) times daily.   metoprolol tartrate 25 MG tablet Commonly known as: LOPRESSOR TAKE 1/2 TABLET(12.5 MG TOTAL) BY MOUTH TWICE DAILY What changed: See the new instructions.   traMADol 50 MG tablet Commonly known as: ULTRAM Take 1 tablet (50 mg total) by mouth every 6 (six) hours as needed. What changed: reasons to take this   triamterene-hydrochlorothiazide 37.5-25 MG tablet Commonly known as: MAXZIDE-25 Take 1 tablet by mouth every morning.   vitamin C 1000 MG tablet Take 1,000 mg by mouth daily.   Xarelto 20 MG Tabs tablet Generic drug: rivaroxaban Take 1 tablet (20 mg total) by mouth daily with supper.      Allergies  Allergen Reactions  . Amlodipine Besylate     Other reaction(s): edema at 10 mg dose  . Codeine Hives  . Latex   . Triamcinolone     Other reaction(s): nausea    Follow-up  Information    , MD. Schedule an appointment as soon as possible for a visit in 1 week(s).   Specialty: Family Medicine Why: Hospital follow-up Contact information: [address] [place] [name] 3512 [phone]                The results of significant diagnostics from this hospitalization (including imaging, microbiology, ancillary and laboratory) are listed below for reference.    Significant Diagnostic Studies: DG Chest 2 View  Result Date: 02/23/2021 CLINICAL DATA:  Chest pain. Pain under LEFT breast radiating to LEFT neck and LEFT arm for several weeks, worsening last night. History of atrial fibrillation. EXAM: CHEST - 2 VIEW COMPARISON:  Chest x-rays dated 02/09/2021 and 02/09/2020. FINDINGS: Stable cardiomegaly. Probable mild atelectasis at the RIGHT lung base. Lungs otherwise clear. No pleural effusion or pneumothorax is seen. Osseous structures about the chest are unremarkable. IMPRESSION: 1. Probable mild atelectasis at the RIGHT lung base. Lungs otherwise clear. 2. Stable cardiomegaly. Electronically Signed   By: [phone] M.D.   On: 02/23/2021 15:46   DG Chest 2 View  Result Date: 02/09/2021 CLINICAL DATA:  Left chest pain for 1 week. EXAM: CHEST - 2 VIEW COMPARISON:  February 09, 2020 FINDINGS: The heart size and  mediastinal contours are within normal limits. Both lungs are clear. The visualized skeletal structures are unremarkable. IMPRESSION: No active cardiopulmonary disease. Electronically Signed   By: Sherian Rein M.D.   On: 02/09/2021 14:27   CT Angio Neck W and/or Wo Contrast  Result Date: 02/09/2021 CLINICAL DATA:  Carotid artery dissection suspected. Evaluate for dissection EXAM: CT ANGIOGRAPHY NECK TECHNIQUE: Multidetector CT imaging of the neck was performed using the standard protocol during bolus administration of intravenous contrast. Multiplanar CT image reconstructions and MIPs were obtained to evaluate the vascular  anatomy. Carotid stenosis measurements (when applicable) are obtained utilizing NASCET criteria, using the distal internal carotid diameter as the denominator. CONTRAST:  OMNIPAQUE IOHEXOL 350 MG/ML SOLN COMPARISON:  No pertinent prior exams available for comparison. FINDINGS: Aortic arch: Common origin of the innominate and left common carotid arteries. The left vertebral artery arises directly from the aortic arch. The visualized aortic arch is normal in caliber. No hemodynamically significant innominate or proximal subclavian artery stenosis. Right carotid system: Streak artifact from a dense right-sided contrast bolus somewhat limits evaluation of the proximal right common carotid artery. Within this limitation, the CCA and ICA are patent within the neck without stenosis or significant atherosclerotic disease. No evidence of dissection. Tortuosity of the cervical ICA. Left carotid system: CCA and ICA patent within the neck without stenosis or significant atherosclerotic disease. No evidence of a dissection. Tortuosity of the cervical ICA Vertebral arteries: Right vertebral artery patent within the neck without stenosis or evidence of dissection. The left vertebral artery is developmentally diminutive, but appears patent throughout the neck. Skeleton: No acute bony abnormality or aggressive osseous lesion. Cervical spondylosis. Reversal of the expected cervical lordosis. Other neck: No neck mass or cervical lymphadenopathy Upper chest: Reported separately. IMPRESSION: 1. Streak artifact from a dense right-sided contrast bolus somewhat limits evaluation of the proximal right common carotid artery. Within this limitation, the bilateral common carotid and internal carotid arteries are patent within the neck without stenosis or significant atherosclerotic disease. No evidence of dissection. Tortuosity of the cervical ICAs bilaterally. 2. The right vertebral artery is dominant and patent throughout the neck  without stenosis or evidence of dissection. 3. The left vertebral artery is developmentally diminutive, but appears patent throughout the neck. 4. CTA chest separately reported. Electronically Signed   By: Jackey Loge DO   On: 02/09/2021 16:27   CT ANGIO CHEST AORTA W/CM & OR WO/CM  Result Date: 02/09/2021 CLINICAL DATA:  Left-sided chest pain for 1 week EXAM: CT ANGIOGRAPHY CHEST WITH CONTRAST TECHNIQUE: Multidetector CT imaging of the chest was performed using the standard protocol during bolus administration of intravenous contrast. Multiplanar CT image reconstructions and MIPs were obtained to evaluate the vascular anatomy. CONTRAST:  OMNIPAQUE IOHEXOL 350 MG/ML SOLN COMPARISON:  Chest x-ray from earlier in the same day. FINDINGS: Cardiovascular: Initial precontrast image shows no hyperdense crescent to suggest acute abnormality. Thoracic aorta demonstrates atherosclerotic calcifications and mild dilatation of the ascending aorta to 4.2 cm. No evidence of dissection is noted. Normal tapering in the aortic arch is noted. Descending thoracic aorta appears within normal limits. Pulmonary artery as visualized is grossly unremarkable but incompletely opacified. Mediastinum/Nodes: Thoracic inlet is within normal limits. No sizable hilar or mediastinal adenopathy is noted. The esophagus is within normal limits. Lungs/Pleura: Mild dependent atelectatic changes are noted in the lungs bilaterally. No focal confluent infiltrate is seen. No effusion or pneumothorax is noted. No sizable parenchymal nodules are seen. Upper Abdomen: Visualized upper abdomen shows  no acute abnormality. Musculoskeletal: Mild degenerative changes of the thoracic spine are noted. No acute bony abnormality is seen. Review of the MIP images confirms the above findings. IMPRESSION: No evidence of aortic dissection. Mild aneurysmal dilatation to 4.2 cm in the ascending aorta. Recommend annual imaging followup by CTA or MRA. This  recommendation follows 2010 ACCF/AHA/AATS/ACR/ASA/SCA/SCAI/SIR/STS/SVM Guidelines for the Diagnosis and Management of Patients with Thoracic Aortic Disease. Circulation. 2010; 121: R604-V409: E266-e369. Aortic aneurysm NOS (ICD10-I71.9) Mild dependent atelectatic changes in the lungs bilaterally. Aortic Atherosclerosis (ICD10-I70.0). Electronically Signed   By: Alcide CleverMark  Lukens M.D.   On: 02/09/2021 16:24    Microbiology: Recent Results (from the past 240 hour(s))  Resp Panel by RT-PCR (Flu A&B, Covid) Nasopharyngeal Swab     Status: None   Collection Time: 02/23/21  8:44 PM   Specimen: Nasopharyngeal Swab; Nasopharyngeal(NP) swabs in vial transport medium  Result Value Ref Range Status   SARS Coronavirus 2 by RT PCR NEGATIVE NEGATIVE Final    Comment: (NOTE) SARS-CoV-2 target nucleic acids are NOT DETECTED.  The SARS-CoV-2 RNA is generally detectable in upper respiratory specimens during the acute phase of infection. The lowest concentration of SARS-CoV-2 viral copies this assay can detect is 138 copies/mL. A negative result does not preclude SARS-Cov-2 infection and should not be used as the sole basis for treatment or other patient management decisions. A negative result may occur with  improper specimen collection/handling, submission of specimen other than nasopharyngeal swab, presence of viral mutation(s) within the areas targeted by this assay, and inadequate number of viral copies(<138 copies/mL). A negative result must be combined with clinical observations, patient history, and epidemiological information. The expected result is Negative.  Fact Sheet for Patients:  BloggerCourse.comhttps://www.fda.gov/media/152166/download  Fact Sheet for Healthcare Providers:  SeriousBroker.ithttps://www.fda.gov/media/152162/download  This test is no t yet approved or cleared by the [place] FDA and  has been authorized for detection and/or diagnosis of SARS-CoV-2 by FDA under an Emergency Use Authorization (EUA). This EUA will  remain  in effect (meaning this test can be used) for the duration of the COVID-19 declaration under Section 564(b)(1) of the Act, 21 U.S.C.section 360bbb-3(b)(1), unless the authorization is terminated  or revoked sooner.       Influenza A by PCR NEGATIVE NEGATIVE Final   Influenza B by PCR NEGATIVE NEGATIVE Final    Comment: (NOTE) The Xpert Xpress SARS-CoV-2/FLU/RSV plus assay is intended as an aid in the diagnosis of influenza from Nasopharyngeal swab specimens and should not be used as a sole basis for treatment. Nasal washings and aspirates are unacceptable for Xpert Xpress SARS-CoV-2/FLU/RSV testing.  Fact Sheet for Patients: BloggerCourse.comhttps://www.fda.gov/media/152166/download  Fact Sheet for Healthcare Providers: SeriousBroker.ithttps://www.fda.gov/media/152162/download  This test is not yet approved or cleared by the [place] FDA and has been authorized for detection and/or diagnosis of SARS-CoV-2 by FDA under an Emergency Use Authorization (EUA). This EUA will remain in effect (meaning this test can be used) for the duration of the COVID-19 declaration under Section 564(b)(1) of the Act, 21 U.S.C. section 360bbb-3(b)(1), unless the authorization is terminated or revoked.  Performed at [facility], [address] [place], [place] [id number]      Labs: Basic Metabolic Panel: Recent Labs  Lab 02/23/21 1524 02/24/21 0221  NA 139 138  K 4.4 4.2  CL 105 103  CO2 26 27  GLUCOSE 73 84  BUN 21 18  CREATININE 1.02* 1.03*  CALCIUM 9.1 8.8*  MG  --  1.8  PHOS  --  3.8  Liver Function Tests: No results for input(s): AST, ALT, ALKPHOS, BILITOT, PROT, ALBUMIN in the last 168 hours. No results for input(s): LIPASE, AMYLASE in the last 168 hours. No results for input(s): AMMONIA in the last 168 hours. CBC: Recent Labs  Lab 02/23/21 1524 02/24/21 0221  WBC 6.1 6.2  HGB 12.2 11.2*  HCT 40.1 36.7  MCV 81.2 81.2  PLT 288 250   Cardiac Enzymes: No results for input(s):  CKTOTAL, CKMB, CKMBINDEX, TROPONINI in the last 168 hours. BNP: BNP (last 3 results) No results for input(s): BNP in the last 8760 hours.  ProBNP (last 3 results) No results for input(s): PROBNP in the last 8760 hours.  CBG: No results for input(s): GLUCAP in the last 168 hours.     Signed:  Edsel Petrin  [facility] 02/24/2021, 11:01 AM

## 2021-02-24 NOTE — Evaluation (Addendum)
Physical Therapy Evaluation Patient Details Name: [name] MRN: [id number] DOB: [birth year] Today's Date: 02/24/2021   History of Present Illness  Patient is a 65 y/o female who presents on 02/23/21 with chest pain radiating to under left breast into left neck/arm for the past month. Admitted with atypical chest pain rule out ACS. Troponin and EKG normal. PMH includes HTN, DVT, A-fib.  Clinical Impression  Patient presents with headache, RLE numbness (premorbid), impaired balance, dyspnea on exertion, decreased activity tolerance and impaired mobility s/p above. Pt lives at home with her spouse and 3 teenage grandchildren. Today mobility session limited due to headache unrelieved by Tylenol. Reports being Mod I for ADLs/IADLs PTA but needs to take breaks during functional tasks and sit due to SOB at baseline. Today, pt tolerated short distance ambulation with use of SPC with supervision for safety. 2/4 DOE noted but VSS on RA. SP02 >93%. No chest pain with activity today. Encouraged walking to bathroom instead of using BSC while in the hospital. Will follow acutely to maximize independence and mobility prior to return home to prevent deconditioning.     Follow Up Recommendations No PT follow up;Supervision - Intermittent    Equipment Recommendations  None recommended by PT    Recommendations for Other Services       Precautions / Restrictions Precautions Precautions: Fall Restrictions Weight Bearing Restrictions: No      Mobility  Bed Mobility Overal bed mobility: Modified Independent             General bed mobility comments: No assist needed, HOB slightly elevated.    Transfers Overall transfer level: Needs assistance Equipment used: Straight cane Transfers: Sit to/from Stand Sit to Stand: Supervision         General transfer comment: Supervision for safety. Stood from [employer].  Ambulation/Gait Ambulation/Gait assistance: Supervision Gait Distance (Feet): 24  Feet Assistive device: Straight cane Gait Pattern/deviations: Step-through pattern;Decreased stride length;Wide base of support Gait velocity: decreased   General Gait Details: Slow, mildly antalgic and unsteady gait with wide BoS and use of SPC; reports numbness in right foot. Limited due to headache. SP02 remained >93% on RA. 2/4 DOE. no chest pain reported.  Stairs            Wheelchair Mobility    Modified Rankin (Stroke Patients Only)       Balance Overall balance assessment: Needs assistance Sitting-balance support: Feet supported;No upper extremity supported Sitting balance-Leahy Scale: Good     Standing balance support: During functional activity Standing balance-Leahy Scale: Fair Standing balance comment: Requires UE support in standing                             Pertinent Vitals/Pain Pain Assessment: 0-10 Pain Score: 10-Worst pain ever Pain Location: headache Pain Descriptors / Indicators: Headache Pain Intervention(s): Monitored during session;Premedicated before session;Limited activity within patient's tolerance;Repositioned    Home Living Family/patient expects to be discharged to:: Private residence Living Arrangements: Spouse/significant other;Other relatives (3 grandchildren- all teenagers) Available Help at Discharge: Family Type of Home: House Home Access: Ramped entrance     Home Layout: One level Home Equipment: Shower seat;Cane - single point;Walker - 2 wheels;Wheelchair - manual      Prior Function Level of Independence: Independent with assistive device(s)         Comments: Uses SPC for ambulation; drives. Does IADLs, ADLs but no grocery shopping.     Hand Dominance  Extremity/Trunk Assessment   Upper Extremity Assessment Upper Extremity Assessment: Defer to OT evaluation    Lower Extremity Assessment Lower Extremity Assessment: RLE deficits/detail RLE Sensation: decreased light touch (right  foot/ankle)    Cervical / Trunk Assessment Cervical / Trunk Assessment: Normal  Communication   Communication: No difficulties  Cognition Arousal/Alertness: Awake/alert Behavior During Therapy: WFL for tasks assessed/performed Overall Cognitive Status: Within Functional Limits for tasks assessed                                 General Comments: Distracted by headache but cooperative.      General Comments General comments (skin integrity, edema, etc.): VSS on RA throughout activity with Sp02 >93% on RA. 2/4 DOE.    Exercises     Assessment/Plan    PT Assessment Patient needs continued PT services  PT Problem List Decreased mobility;Impaired sensation;Pain;Decreased balance;Cardiopulmonary status limiting activity;Decreased activity tolerance       PT Treatment Interventions Therapeutic exercise;Gait training;Patient/family education;Therapeutic activities;Balance training    PT Goals (Current goals can be found in the Care Plan section)  Acute Rehab PT Goals Patient Stated Goal: to get this headache better PT Goal Formulation: With patient Time For Goal Achievement: 03/10/21 Potential to Achieve Goals: Fair    Frequency Min 3X/week   Barriers to discharge        Co-evaluation               AM-PAC PT "6 Clicks" Mobility  Outcome Measure Help needed turning from your back to your side while in a flat bed without using bedrails?: None Help needed moving from lying on your back to sitting on the side of a flat bed without using bedrails?: None Help needed moving to and from a bed to a chair (including a wheelchair)?: A Little Help needed standing up from a chair using your arms (e.g., wheelchair or bedside chair)?: A Little Help needed to walk in hospital room?: A Little Help needed climbing 3-5 steps with a railing? : A Little 6 Click Score: 20    End of Session   Activity Tolerance: Patient limited by pain Patient left: in bed;with call  bell/phone within reach;with bed alarm set Nurse Communication: Mobility status;Other (comment) (limited by headache) PT Visit Diagnosis: Pain;Unsteadiness on feet (R26.81) Pain - part of body:  (head)    Time: 0175-1025 PT Time Calculation (min) (ACUTE ONLY): 16 min   Charges:   PT Evaluation $PT Eval Moderate Complexity: 1 Mod          Vale Haven, PT, DPT Acute Rehabilitation Services Pager [phone] Office [phone]      Blake Divine A Jasten Guyette 02/24/2021, 11:13 AM

## 2021-02-24 NOTE — Progress Notes (Signed)
Progress Note  Patient Name: [name] Date of Encounter: 02/24/2021  [facility] Cardiologist: Allred  Subjective   Currently feeling well without chest pain.  Has nitroglycerin patch in place.  Presented with chest pain.  High-sensitivity troponin negative x2 during chest pain.  Inpatient Medications    Scheduled Meds: . vitamin C  1,000 mg Oral Daily  . buPROPion  200 mg Oral Daily  . DULoxetine  30 mg Oral Daily  . gabapentin  400 mg Oral TID  . metoprolol tartrate  12.5 mg Oral BID  . rivaroxaban  20 mg Oral Q supper  . triamterene-hydrochlorothiazide  1 tablet Oral q morning   Continuous Infusions: . lactated ringers 50 mL/hr at 02/23/21 2043   PRN Meds: acetaminophen, hydrocortisone cream, melatonin, morphine injection, ondansetron (ZOFRAN) IV, polyethylene glycol, traMADol   Vital Signs    Vitals:   02/24/21 0308 02/24/21 0723 02/24/21 0904 02/24/21 0907  BP: 113/67 105/69 127/68   Pulse: 66 (!) 54  64  Resp: 19 20    Temp: 97.7 F (36.5 C) 97.6 F (36.4 C)    TempSrc: Oral Oral    SpO2: 96% 96%    Weight:        Intake/Output Summary (Last 24 hours) at 02/24/2021 0954 Last data filed at 02/24/2021 0906 Gross per 24 hour  Intake 564.72 ml  Output 875 ml  Net -310.28 ml   Last 3 Weights 02/24/2021 02/23/2021 02/14/2021  Weight (lbs) 331 lb 12.8 oz 332 lb 14.4 oz 337 lb 3.2 oz  Weight (kg) 150.503 kg 151.002 kg 152.953 kg      Telemetry    Atrial fibrillation- Personally Reviewed  ECG    Atrial fibrillation, PVC, rate 64- Personally Reviewed  Physical Exam   GEN: No acute distress.   Neck: No JVD Cardiac: RRR, no murmurs, rubs, or gallops.  Respiratory: Clear to auscultation bilaterally. GI: Soft, nontender, non-distended  MS: No edema; No deformity. Neuro:  Nonfocal  Psych: Normal affect   Labs    High Sensitivity Troponin:   Recent Labs  Lab 02/09/21 1340 02/09/21 1526 02/23/21 1524 02/23/21 1930  TROPONINIHS 3 3 4 4        Chemistry Recent Labs  Lab 02/23/21 1524 02/24/21 0221  NA 139 138  K 4.4 4.2  CL 105 103  CO2 26 27  GLUCOSE 73 84  BUN 21 18  CREATININE 1.02* 1.03*  CALCIUM 9.1 8.8*  GFRNONAA >60 >60  ANIONGAP 8 8     Hematology Recent Labs  Lab 02/23/21 1524 02/24/21 0221  WBC 6.1 6.2  RBC 4.94 4.52  HGB 12.2 11.2*  HCT 40.1 36.7  MCV 81.2 81.2  MCH 24.7* 24.8*  MCHC 30.4 30.5  RDW 16.1* 16.2*  PLT 288 250    BNPNo results for input(s): BNP, PROBNP in the last 168 hours.   DDimer No results for input(s): DDIMER in the last 168 hours.   Radiology    DG Chest 2 View  Result Date: 02/23/2021 CLINICAL DATA:  Chest pain. Pain under LEFT breast radiating to LEFT neck and LEFT arm for several weeks, worsening last night. History of atrial fibrillation. EXAM: CHEST - 2 VIEW COMPARISON:  Chest x-rays dated 02/09/2021 and 02/09/2020. FINDINGS: Stable cardiomegaly. Probable mild atelectasis at the RIGHT lung base. Lungs otherwise clear. No pleural effusion or pneumothorax is seen. Osseous structures about the chest are unremarkable. IMPRESSION: 1. Probable mild atelectasis at the RIGHT lung base. Lungs otherwise clear. 2. Stable cardiomegaly.  Electronically Signed   By: Bary Richard M.D.   On: 02/23/2021 15:46    Cardiac Studies   Myoview 02/24/2020  The left ventricular ejection fraction is normal (55-65%).  Nuclear stress EF: 49%.  There was no ST segment deviation noted during stress.  The study is normal.  This is a low risk study.  TTE 02/23/2020 1. Left ventricular ejection fraction, by estimation, is 55 to 60%. The  left ventricle has normal function. The left ventricle has no regional  wall motion abnormalities. There is moderate left ventricular hypertrophy.  Left ventricular diastolic function  could not be evaluated.  2. Right ventricular systolic function is normal. The right ventricular  size is normal. There is normal pulmonary artery systolic pressure.   3. Left atrial size was severely dilated.  4. The mitral valve is abnormal. Mild to moderate mitral valve  regurgitation.  5. The aortic valve is tricuspid. Aortic valve regurgitation is not  visualized.  6. Aortic dilatation noted. There is mild dilatation of the ascending  aorta measuring 42 mm.  7. The inferior vena cava is normal in size with greater than 50%  respiratory variability, suggesting right atrial pressure of 3 mmHg.   Patient Profile     65 y.o. female with a history of atrial fibrillation, DVTs, hypertension, anxiety and depression who presented to the hospital with chest pain.  Assessment & Plan    1.  Chest pain: Patient has had an echo that shows no wall motion abnormalities and a low risk Myoview.  Despite this, she continues to have chest pain.  Fortunately, her high-sensitivity troponin is negative x2.  She has a planned outpatient coronary CT for Tuesday.  At this point, no further work-up is necessary.  Okay for discharge and coronary CT Tuesday.  [facility] Artur Winningham sign off.   Medication Recommendations: None new Other recommendations (labs, testing, etc): None Follow up as an outpatient: Outpatient CTs planned for Tuesday  For questions or updates, please contact [facility] Please consult [email or web address] for contact info under        Signed, Rashia Mckesson Jorja Loa, MD  02/24/2021, 9:54 AM

## 2021-02-24 NOTE — Discharge Instructions (Signed)
Chest Wall Pain Chest wall pain is pain in or around the bones and muscles of your chest. Chest wall pain may be caused by:  An injury.  Coughing a lot.  Using your chest and arm muscles too much. Sometimes, the cause may not be known. This pain may take a few weeks or longer to get better. Follow these instructions at home: Managing pain, stiffness, and swelling If told, put ice on the painful area:  Put ice in a plastic bag.  Place a towel between your skin and the bag.  Leave the ice on for 20 minutes, 2-3 times a day.   Activity  Rest as told by your doctor.  Avoid doing things that cause pain. This includes lifting heavy items.  Ask your doctor what activities are safe for you. General instructions  Take over-the-counter and prescription medicines only as told by your doctor.  Do not use any products that contain nicotine or tobacco, such as cigarettes, e-cigarettes, and chewing tobacco. If you need help quitting, ask your doctor.  Keep all follow-up visits as told by your doctor. This is important.   Contact a doctor if:  You have a fever.  Your chest pain gets worse.  You have new symptoms. Get help right away if:  You feel sick to your stomach (nauseous) or you throw up (vomit).  You feel sweaty or light-headed.  You have a cough with mucus from your lungs (sputum) or you cough up blood.  You are short of breath. These symptoms may be an emergency. Do not wait to see if the symptoms will go away. Get medical help right away. Call your local emergency services (911 in the [place] Do not drive yourself to the hospital. Summary  Chest wall pain is pain in or around the bones and muscles of your chest.  It may be treated with ice, rest, and medicines. Your condition may also get better if you avoid doing things that cause pain.  Contact a doctor if you have a fever, chest pain that gets worse, or new symptoms.  Get help right away if you feel light-headed  or you get short of breath. These symptoms may be an emergency. This information is not intended to replace advice given to you by your health care provider. Make sure you discuss any questions you have with your health care provider. Document Revised: 04/15/2018 Document Reviewed: 04/15/2018 Elsevier Patient Education  2021 [employer]   Nonspecific Chest Pain Chest pain can be caused by many different conditions. Some causes of chest pain can be life-threatening. These will require treatment right away. Serious causes of chest pain include:  Heart attack.  A tear in the body's main blood vessel.  Redness and swelling (inflammation) around your heart.  Blood clot in your lungs. Other causes of chest pain may not be so serious. These include:  Heartburn.  Anxiety or stress.  Damage to bones or muscles in your chest.  Lung infections. Chest pain can feel like:  Pain or discomfort in your chest.  Crushing, pressure, aching, or squeezing pain.  Burning or tingling.  Dull or sharp pain that is worse when you move, cough, or take a deep breath.  Pain or discomfort that is also felt in your back, neck, jaw, shoulder, or arm, or pain that spreads to any of these areas. It is hard to know whether your pain is caused by something that is serious or something that is not so serious. So it is   important to see your doctor right away if you have chest pain. Follow these instructions at home: Medicines  Take over-the-counter and prescription medicines only as told by your doctor.  If you were prescribed an antibiotic medicine, take it as told by your doctor. Do not stop taking the antibiotic even if you start to feel better. Lifestyle  Rest as told by your doctor.  Do not use any products that contain nicotine or tobacco, such as cigarettes, e-cigarettes, and chewing tobacco. If you need help quitting, ask your doctor.  Do not drink alcohol.  Make lifestyle changes as told by  your doctor. These may include: ? Getting regular exercise. Ask your doctor what activities are safe for you. ? Eating a heart-healthy diet. A diet and nutrition specialist (dietitian) can help you to learn healthy eating options. ? Staying at a healthy weight. ? Treating diabetes or high blood pressure, if needed. ? Lowering your stress. Activities such as yoga and relaxation techniques can help.   General instructions  Pay attention to any changes in your symptoms. Tell your doctor about them or any new symptoms.  Avoid any activities that cause chest pain.  Keep all follow-up visits as told by your doctor. This is important. You may need more testing if your chest pain does not go away. Contact a doctor if:  Your chest pain does not go away.  You feel depressed.  You have a fever. Get help right away if:  Your chest pain is worse.  You have a cough that gets worse, or you cough up blood.  You have very bad (severe) pain in your belly (abdomen).  You pass out (faint).  You have either of these for no clear reason: ? Sudden chest discomfort. ? Sudden discomfort in your arms, back, neck, or jaw.  You have shortness of breath at any time.  You suddenly start to sweat, or your skin gets clammy.  You feel sick to your stomach (nauseous).  You throw up (vomit).  You suddenly feel lightheaded or dizzy.  You feel very weak or tired.  Your heart starts to beat fast, or it feels like it is skipping beats. These symptoms may be an emergency. Do not wait to see if the symptoms will go away. Get medical help right away. Call your local emergency services (911 in the [place] Do not drive yourself to the hospital. Summary  Chest pain can be caused by many different conditions. The cause may be serious and need treatment right away. If you have chest pain, see your doctor right away.  Follow your doctor's instructions for taking medicines and making lifestyle changes.  Keep all  follow-up visits as told by your doctor. This includes visits for any further testing if your chest pain does not go away.  Be sure to know the signs that show that your condition has become worse. Get help right away if you have these symptoms. This information is not intended to replace advice given to you by your health care provider. Make sure you discuss any questions you have with your health care provider. Document Revised: 04/15/2018 Document Reviewed: 04/15/2018 Elsevier Patient Education  2021 Elsevier Inc.  

## 2021-02-24 NOTE — Progress Notes (Signed)
OT Cancellation Note and Discharge  Patient Details Name: [name] MRN: [id number] DOB: Apr 11, 1956   Cancelled Treatment:    Reason Eval/Treat Not Completed: OT screened, no needs identified, will sign off. Noted pt S with PT earlier at [facility] level. In to speak with pt about OT and she reports she does not foresee any issues with basic ADLs and she has someone with her 24/7.  Ignacia Palma, OTR/L Acute Rehab Services Pager [phone] Office [phone]     [name] 02/24/2021, 11:44 AM

## 2021-02-24 NOTE — Progress Notes (Addendum)
Discharge education and medication education given to patient with teach back. Education on heart healthy diet and when to call MD given. All questions and concerns answered. Peripheral IV and telemetry leads removed. All patient belongings given to patient. Patient transported to main entrance via wheelchair by NT.

## 2021-02-25 ENCOUNTER — Telehealth (HOSPITAL_COMMUNITY): Payer: Self-pay | Admitting: Emergency Medicine

## 2021-02-25 NOTE — Telephone Encounter (Signed)
Calling patient to explain that her case was discussed with Renee and cardiac CT reader (Oneal) and it was suggested that we not go through with her CCTA tomorrow. She has afib and her BMI >60 which significantly increases her risk of a non-diagnostic scan  Pt verbalized understanding and appreciated the explanation and is looking forward to follow up call from the office regarding the next plan of care for her.   Rockwell Alexandria RN Navigator Cardiac Imaging [facility] [phone] Office  [phone] Cell

## 2021-02-26 ENCOUNTER — Ambulatory Visit (HOSPITAL_COMMUNITY): Payer: Medicare Other

## 2021-02-27 DIAGNOSIS — G4733 Obstructive sleep apnea (adult) (pediatric): Secondary | ICD-10-CM | POA: Diagnosis not present

## 2021-02-28 DIAGNOSIS — R079 Chest pain, unspecified: Secondary | ICD-10-CM | POA: Diagnosis not present

## 2021-02-28 DIAGNOSIS — R1012 Left upper quadrant pain: Secondary | ICD-10-CM | POA: Diagnosis not present

## 2021-03-01 ENCOUNTER — Telehealth: Payer: Self-pay | Admitting: *Deleted

## 2021-03-01 NOTE — Telephone Encounter (Signed)
Spoke with patient about another option for pain behind her breast and by her spine( heart catherization) other than CT coronary and to follow up with Pcp, which she has 02-28-21. Her Pcp recommends to do an CT of her abdomin and back. Patient was seen in hospital on 02-23-21 was given Nitro and pain went away but  then pain return 02-27-21.  She also mention in the hospital that a provider mentioned of her Gallbladder being repositioned. Patient is willing to keep scheduled appointment with Francis Dowse on 6-3 to further discuss other options.

## 2021-03-04 ENCOUNTER — Other Ambulatory Visit: Payer: Self-pay | Admitting: Family Medicine

## 2021-03-04 DIAGNOSIS — G4733 Obstructive sleep apnea (adult) (pediatric): Secondary | ICD-10-CM | POA: Diagnosis not present

## 2021-03-04 DIAGNOSIS — R1012 Left upper quadrant pain: Secondary | ICD-10-CM

## 2021-03-06 NOTE — Telephone Encounter (Signed)
New Patient appointment spot is being held on Dr. Hoyle Barr schedule this Friday, 03/08/21, at 10:40AM.

## 2021-03-06 NOTE — Telephone Encounter (Signed)
Dr. Hoyle Barr schedule is now overbooked with new patients, so patient will need to be scheduled with next available cardiologist (not EP) when she returns call.

## 2021-03-06 NOTE — Telephone Encounter (Signed)
Called pt to schedule hospital f/u per proficient referral received. Pt requested this message be sent to check on the status of this previous discussion due to not hearing back yet. Please advise.

## 2021-03-06 NOTE — Telephone Encounter (Signed)
Left message for patient to call back  

## 2021-03-18 ENCOUNTER — Inpatient Hospital Stay: Admission: RE | Admit: 2021-03-18 | Payer: Medicare Other | Source: Ambulatory Visit

## 2021-03-19 ENCOUNTER — Other Ambulatory Visit (HOSPITAL_BASED_OUTPATIENT_CLINIC_OR_DEPARTMENT_OTHER): Payer: Self-pay

## 2021-03-19 DIAGNOSIS — G4733 Obstructive sleep apnea (adult) (pediatric): Secondary | ICD-10-CM

## 2021-03-22 ENCOUNTER — Other Ambulatory Visit: Payer: Self-pay | Admitting: *Deleted

## 2021-03-22 MED ORDER — METOPROLOL TARTRATE 25 MG PO TABS: ORAL_TABLET | ORAL | 2 refills | Status: DC

## 2021-03-26 DIAGNOSIS — L98499 Non-pressure chronic ulcer of skin of other sites with unspecified severity: Secondary | ICD-10-CM | POA: Diagnosis not present

## 2021-03-26 DIAGNOSIS — M19072 Primary osteoarthritis, left ankle and foot: Secondary | ICD-10-CM | POA: Diagnosis not present

## 2021-03-26 DIAGNOSIS — M00072 Staphylococcal arthritis, left ankle and foot: Secondary | ICD-10-CM | POA: Diagnosis not present

## 2021-03-26 DIAGNOSIS — G8929 Other chronic pain: Secondary | ICD-10-CM | POA: Diagnosis not present

## 2021-03-26 DIAGNOSIS — M25562 Pain in left knee: Secondary | ICD-10-CM | POA: Diagnosis not present

## 2021-03-28 NOTE — Progress Notes (Signed)
Cardiology Office Note Date:  03/28/2021  Patient ID:  [name] [birth year], MRN [id number] PCP:  Laurann Montana, MD  Electrophysiologist: Dr. Johney Frame    Chief Complaint: planned f/u   History of Present Illness: [name] is a 65 y.o. female with history of HTN, DVT, AFib, morbid obesity.  She come sin today to be seen for Dr. Johney Frame, last seen by him  April 2021, at that time finally starting to get over a long road with osteomyelitis of her foot and immobility issues with that, subsequent weight gain and associated DOE with it.  Motivated to get back in track with exercise and weight loss.  Planned to update her echo. Referred to Dr. Earl Gala for snoring  ER visit 02/09/21 with CP chest CTonly noted mild dilation of aorta, no acute findings, labs unremarkable, EKG not ischemic, HS trop neg x2 Discharged from the ER.  I saw her 02/14/21 She is accompanied by he daughter She reports the CP on/off, waxing/waning about 4 months She locates it to the lower L rib border and radiates to her back, has a hadr time describing it, maybe a stabbing pain She also had neck pain that is posterior neck from the base of her skull down to her shoulder that is a tense sensation Sometimes deep breathing can make her pain worse Ambulation not predictably Not tender or reproducible with palpation She mentions seeing her PMD for her breathing problem again and given some antibiotics, reports seeing a pulmonologist a year ago for SOB and no lung problems were found. Can sometimes her herself wheezing though No symptoms of PND or orthopnea No near syncope or syncope  CP felt to be atypical, had neg stress test April 2021, the patient remained unconvinced and requested further w/u and planned for CT coronaries.  Unfortunately 2/2 her AFib and BMI, cardiology reader did not think we would get any diagnostic images.  She had another ER visit with c/o CP/neck pain 02/23/21 and discharged with  recs to f/u with CT as ordered (unaware was cancelled).  The patient was made aware that the CT was being cancelled and why, she reported that her PMD planned for CT abd and back.  TODAY She is feeling much better In hind-sight she thinks her pains were musculoskeletal, mentioning that the LUQ (CP) and back pain were connected and was unable to lay flat on her back without provoking the pain, and certain movements would cause it as well, and thinks she probably sprained a muscle.  ALso noted that she was always leaning quite hard on her cane with her L arm and thinks this was likely some of her neck pain and back pain  No palpitations or cardiac awareness.  She has been found with OSA and pending CPAP titration  No near syncope or syncope. No bleeding or signs of bleeding  Unable to completed her abd CT the oral contrast gave her diarrhea and was unable to stay.   Past Medical History:  Diagnosis Date  . Atrial fibrillation (HCC)   . Chronic lower back pain   . Depression   . DVT (deep venous thrombosis) (HCC) 1990s   LLE  . Fallen arches   . Hypertension   . Hyperthyroidism ~ 2000   "fine now" (04/23/2016)  . Joint pain   . Lactose intolerance   . Leg edema   . Obesity   . Pneumonia 1980s X 1  . Snoring    has not had sleep  study but suspects sleep apnea    Past Surgical History:  Procedure Laterality Date  . CARDIOVERSION N/A 06/18/2016   Procedure: CARDIOVERSION;  Surgeon: Laurey Morale, MD;  Location: [facility] ENDOSCOPY;  Service: Cardiovascular;  Laterality: N/A;  . CARDIOVERSION N/A 07/11/2016   Procedure: CARDIOVERSION;  Surgeon: Lewayne Bunting, MD;  Location: [facility] ENDOSCOPY;  Service: Cardiovascular;  Laterality: N/A;  . CARDIOVERSION N/A 07/02/2017   Procedure: CARDIOVERSION;  Surgeon: Thurmon Fair, MD;  Location: [facility] ENDOSCOPY;  Service: Cardiovascular;  Laterality: N/A;  . CARDIOVERSION N/A 09/29/2017   Procedure: CARDIOVERSION;  Surgeon: Laurey Morale, MD;   Location: [facility] ENDOSCOPY;  Service: Cardiovascular;  Laterality: N/A;  . ENDOMETRIAL ABLATION  ~2006  . TONSILLECTOMY  1960s  . TUBAL LIGATION  1979    Current Outpatient Medications  Medication Sig Dispense Refill  . acetaminophen (TYLENOL) 325 MG tablet Take 2 tablets by mouth every 4 (four) hours as needed for pain.    . Ascorbic Acid (VITAMIN C) 1000 MG tablet Take 1,000 mg by mouth daily.    Marland Kitchen buPROPion (WELLBUTRIN SR) 200 MG 12 hr tablet Take 1 tablet (200 mg total) by mouth daily. 30 tablet 0  . DULoxetine (CYMBALTA) 30 MG capsule Take 30 mg by mouth daily. Takes with 60mg  capsule to total 90mg  daily.    . DULoxetine (CYMBALTA) 60 MG capsule Take 60 mg by mouth daily. Takes with 30mg  dose for a total dose of 90mg  daily.    gabapentin (NEURONTIN) 400 MG capsule Take 400 mg by mouth 3 (three) times daily.    . metoprolol tartrate (LOPRESSOR) 25 MG tablet TAKE 1/2 TABLET(12.5 MG TOTAL) BY MOUTH TWICE DAILY 90 tablet 2  . traMADol (ULTRAM) 50 MG tablet Take 1 tablet (50 mg total) by mouth every 6 (six) hours as needed. (Patient taking differently: Take 50 mg by mouth every 6 (six) hours as needed for moderate pain.) 15 tablet 0  . triamterene-hydrochlorothiazide (MAXZIDE-25) 37.5-25 MG tablet Take 1 tablet by mouth every morning.     20 MG TABS tablet Take 1 tablet (20 mg total) by mouth daily with supper. 90 tablet 1   No current facility-administered medications for this visit.    Allergies:   Amlodipine besylate, Codeine, Latex, and Triamcinolone   Social History:  The patient  reports that she quit smoking about 33 years ago. Her smoking use included cigarettes. She has a 10.00 pack-year smoking history. She has never used smokeless tobacco. She reports current alcohol use of about 4.0 standard drinks of alcohol per week. She reports current drug use. Drug: Marijuana.   Family History:  The patient's family history includes Arthritis in her mother; CAD in her maternal  grandfather; CVA in her father; Diabetes Mellitus I in her father; Liver disease in her mother; Obesity in her mother; Thyroid disease in her mother.  ROS:  Please see the history of present illness.    All other systems are reviewed and otherwise negative.   PHYSICAL EXAM:  VS:  There were no vitals taken for this visit. BMI: There is no height or weight on file to calculate BMI. Well nourished, well developed, in no acute distress HEENT: normocephalic, atraumatic Neck: no JVD, carotid bruits or masses Cardiac:  RRR; no significant murmurs, no rubs, or gallops Lungs:  CTA b/l, no wheezing, rhonchi or rales Abd: soft, nontender, obese MS: no deformity or atrophy Ext:  no edema Skin: warm and dry, no rash Neuro:  No gross deficits  appreciated Psych: euthymic mood, full affect    EKG:  Not done today ER EKG is reviewed from 02/09/21:  AFib 67bpm, no ST/T changes  02/09/21: CT angio neck and chest IMPRESSION: 1. Streak artifact from a dense right-sided contrast bolus somewhat limits evaluation of the proximal right common carotid artery. Within this limitation, the bilateral common carotid and internal carotid arteries are patent within the neck without stenosis or significant atherosclerotic disease. No evidence of dissection. Tortuosity of the cervical ICAs bilaterally. 2. The right vertebral artery is dominant and patent throughout the neck without stenosis or evidence of dissection. 3. The left vertebral artery is developmentally diminutive, but appears patent throughout the neck. 4. CTA chest separately reported.  IMPRESSION: No evidence of aortic dissection. Mild aneurysmal dilatation to 4.2 cm in the ascending aorta. Recommend annual imaging followup by CTA or MRA. This recommendation follows 2010 ACCF/AHA/AATS/ACR/ASA/SCA/SCAI/SIR/STS/SVM Guidelines for the Diagnosis and Management of Patients with Thoracic Aortic Disease. Circulation. 2010; 121: Z610-R604: E266-e369. Aortic  aneurysm NOS (ICD10-I71.9)   02/23/2020: TTE IMPRESSIONS  1. Left ventricular ejection fraction, by estimation, is 55 to 60%. The  left ventricle has normal function. The left ventricle has no regional  wall motion abnormalities. There is moderate left ventricular hypertrophy.  Left ventricular diastolic function  could not be evaluated.  2. Right ventricular systolic function is normal. The right ventricular  size is normal. There is normal pulmonary artery systolic pressure.  3. Left atrial size was severely dilated.  4. The mitral valve is abnormal. Mild to moderate mitral valve  regurgitation.  5. The aortic valve is tricuspid. Aortic valve regurgitation is not  visualized.  6. Aortic dilatation noted. There is mild dilatation of the ascending  aorta measuring 42 mm.  7. The inferior vena cava is normal in size with greater than 50%  respiratory variability, suggesting right atrial pressure of 3 mmHg.     02/23/2020; stress myoview  The left ventricular ejection fraction is normal (55-65%).  Nuclear stress EF: 49%.  There was no ST segment deviation noted during stress.  The study is normal.  This is a low risk study.   Normal pharmacologic nuclear stress test with no evidence for prior infarct or ischemia. LVEF 55%. Frequent PVCs during stress and in recovery.     Recent Labs: 08/15/2020: TSH 4.50 02/24/2021: ALT 15; BUN 18; Creatinine, Ser 1.03; Hemoglobin 11.2; Magnesium 1.8; Platelets 250; Potassium 4.2; Sodium 138  02/24/2021: Cholesterol 163; HDL 72; LDL Cholesterol 82; Total CHOL/HDL Ratio 2.3; Triglycerides 47; VLDL 9   CrCl cannot be calculated (Patient's most recent lab result is older than the maximum 21 days allowed.).   Wt Readings from Last 3 Encounters:  02/24/21 (!) 331 lb 12.8 oz (150.5 kg)  02/14/21 (!) 337 lb 3.2 oz (153 kg)  02/09/21 (!) 332 lb (150.6 kg)     Other studies reviewed: Additional studies/records reviewed today include:  summarized above  ASSESSMENT AND PLAN:  1. Longstanding persistent Afib     CHA2DS2Vsc is 4, on xarelto, appropriately dosed     Rate controlled and asymptomatic  2. HTN     Looks OK  4. Morbid obesity     She is looking into bariatric surgery     Discussed I felt her SOB likely was 2/2 her weight     Encouraged to continue this discussion and evaluation  5. CP     Atypical and likely musculoskeletal     resolved   6. Mild aneurysmal dilatation to 4.2  cm in the ascending aorta.     plan annual imaging    Disposition: will have her back in 72mo, sooner if needed   Current medicines are reviewed at length with the patient today.  The patient did not have any concerns regarding medicines.  Norma Fredrickson, PA-C 03/28/2021 7:15 AM     [facility] [address] [place] [address] [phone] (office)  [phone] (fax)

## 2021-03-29 ENCOUNTER — Other Ambulatory Visit: Payer: Self-pay | Admitting: Internal Medicine

## 2021-03-29 ENCOUNTER — Other Ambulatory Visit: Payer: Self-pay

## 2021-03-29 ENCOUNTER — Ambulatory Visit: Payer: Medicare Other | Admitting: Physician Assistant

## 2021-03-29 ENCOUNTER — Encounter: Payer: Self-pay | Admitting: Physician Assistant

## 2021-03-29 VITALS — BP 136/80 | HR 85 | Ht 62.0 in | Wt 335.0 lb

## 2021-03-29 DIAGNOSIS — R079 Chest pain, unspecified: Secondary | ICD-10-CM

## 2021-03-29 DIAGNOSIS — I4811 Longstanding persistent atrial fibrillation: Secondary | ICD-10-CM

## 2021-03-29 DIAGNOSIS — I10 Essential (primary) hypertension: Secondary | ICD-10-CM | POA: Diagnosis not present

## 2021-03-29 MED ORDER — METOPROLOL TARTRATE 25 MG PO TABS: ORAL_TABLET | ORAL | 3 refills | Status: DC

## 2021-03-29 MED ORDER — XARELTO 20 MG PO TABS: ORAL_TABLET | ORAL | 1 refills | Status: DC

## 2021-03-29 NOTE — Telephone Encounter (Signed)
84f, 153kg, 1.03 02/24/21, lovw/ursuy 02/14/21, ccr 135

## 2021-03-29 NOTE — Patient Instructions (Signed)
Medication Instructions:  Your physician recommends that you continue on your current medications as directed. Please refer to the Current Medication list given to you today.  *If you need a refill on your cardiac medications before your next appointment, please call your pharmacy*   Lab Work: None ordered   If you have labs (blood work) drawn today and your tests are completely normal, you will receive your results only by: Marland Kitchen MyChart Message (if you have MyChart) OR . A paper copy in the mail If you have any lab test that is abnormal or we need to change your treatment, we will call you to review the results.   Testing/Procedures: None ordered    Follow-Up: At [facility], you and your health needs are our priority.  As part of our continuing mission to provide you with exceptional heart care, we have created designated Provider Care Teams.  These Care Teams include your primary Cardiologist (physician) and Advanced Practice Providers (APPs -  Physician Assistants and Nurse Practitioners) who all work together to provide you with the care you need, when you need it.  We recommend signing up for the patient portal called "MyChart".  Sign up information is provided on this After Visit Summary.  MyChart is used to connect with patients for Virtual Visits (Telemedicine).  Patients are able to view lab/test results, encounter notes, upcoming appointments, etc.  Non-urgent messages can be sent to your provider as well.   To learn more about what you can do with MyChart, go to [email or web address].    Your next appointment:   6 month(s)  The format for your next appointment:   In Person  Provider:   You may see Hillis Range, MD or one of the following Advanced Practice Providers on your designated Care Team:    Francis Dowse, [place]  Casimiro Needle "Mardelle Matte" Lanna Poche, [place]    Other Instructions None

## 2021-03-29 NOTE — Telephone Encounter (Signed)
69f, 152kg, scr 1.03 02/24/21, lovw/ursuy 03/29/21, ccr 134.1

## 2021-04-11 DIAGNOSIS — I10 Essential (primary) hypertension: Secondary | ICD-10-CM | POA: Diagnosis not present

## 2021-04-11 DIAGNOSIS — E559 Vitamin D deficiency, unspecified: Secondary | ICD-10-CM | POA: Diagnosis not present

## 2021-04-11 DIAGNOSIS — G4733 Obstructive sleep apnea (adult) (pediatric): Secondary | ICD-10-CM | POA: Diagnosis not present

## 2021-04-11 DIAGNOSIS — E119 Type 2 diabetes mellitus without complications: Secondary | ICD-10-CM | POA: Diagnosis not present

## 2021-04-11 DIAGNOSIS — M2550 Pain in unspecified joint: Secondary | ICD-10-CM | POA: Diagnosis not present

## 2021-04-11 DIAGNOSIS — I4891 Unspecified atrial fibrillation: Secondary | ICD-10-CM | POA: Diagnosis not present

## 2021-04-15 ENCOUNTER — Other Ambulatory Visit: Payer: Self-pay

## 2021-04-15 ENCOUNTER — Ambulatory Visit (HOSPITAL_BASED_OUTPATIENT_CLINIC_OR_DEPARTMENT_OTHER): Payer: Medicare Other | Attending: Internal Medicine | Admitting: Internal Medicine

## 2021-04-15 ENCOUNTER — Telehealth: Payer: Self-pay | Admitting: *Deleted

## 2021-04-15 VITALS — Ht 62.0 in | Wt 303.0 lb

## 2021-04-15 DIAGNOSIS — G4733 Obstructive sleep apnea (adult) (pediatric): Secondary | ICD-10-CM | POA: Diagnosis not present

## 2021-04-15 NOTE — Telephone Encounter (Signed)
Pharmacy, can you please comment on how long Xarelto can be held for upcoming procedure?  Thank you! 

## 2021-04-15 NOTE — Telephone Encounter (Signed)
   [facility] Pre-operative Risk Assessment    Patient Name: [name]  DOB: 02/17/1956  MRN: [id number]   HEARTCARE STAFF: - Please ensure there is not already an duplicate clearance open for this procedure. - Under Visit Info/Reason for Call, type in Other and utilize the format Clearance MM/DD/YY or Clearance TBD. Do not use dashes or single digits. - If request is for dental extraction, please clarify the # of teeth to be extracted. - If the patient is currently at the dentist's office, call Pre-Op APP to address. If the patient is not currently in the dentist office, please route to the Pre-Op pool  Request for surgical clearance:  What type of surgery is being performed? BARIATRIC SURGERY   When is this surgery scheduled? TBD   What type of clearance is required (medical clearance vs. Pharmacy clearance to hold med vs. Both)? BOTH  Are there any medications that need to be held prior to surgery and how long? XARELTO: DOES PT NEED LOVENOX BRIDGING?   Practice name and name of physician performing surgery? [facility]; DR. Romana Juniper   What is the office phone number? [phone]   7.   What is the office fax number? [place]: [name], CMA   8.   Anesthesia type (None, local, MAC, general) ? GENERAL   Julaine Hua 04/15/2021, 2:03 PM  _________________________________________________________________   (provider comments below)

## 2021-04-15 NOTE — Telephone Encounter (Signed)
Patient with diagnosis of atrial fibrillation on Xarelto for anticoagulation.    Procedure: bariatric surgery Date of procedure: TBD   CHA2DS2-VASc Score = 2  This indicates a 2.2% annual risk of stroke. The patient's score is based upon: CHF History: No HTN History: Yes Diabetes History: No Stroke History: No Vascular Disease History: No Age Score: 0 Gender Score: 1      CrCl 80.2 Platelet count 250  (Note - patient has history of DVT - LLE, listed only as in "1990's".   No other information mentioned.  This would not be counted as a CVA/TIA equivalent)  Per office protocol, patient can hold Xarelto for 2 days prior to procedure.    Patient will not need bridging with Lovenox (enoxaparin) around procedure.

## 2021-04-16 ENCOUNTER — Other Ambulatory Visit (HOSPITAL_BASED_OUTPATIENT_CLINIC_OR_DEPARTMENT_OTHER): Payer: Self-pay

## 2021-04-16 ENCOUNTER — Other Ambulatory Visit (HOSPITAL_COMMUNITY): Payer: Self-pay | Admitting: Surgery

## 2021-04-16 ENCOUNTER — Other Ambulatory Visit: Payer: Self-pay | Admitting: Surgery

## 2021-04-16 DIAGNOSIS — G4733 Obstructive sleep apnea (adult) (pediatric): Secondary | ICD-10-CM

## 2021-04-16 DIAGNOSIS — I7121 Aneurysm of the ascending aorta, without rupture: Secondary | ICD-10-CM

## 2021-04-16 DIAGNOSIS — I712 Thoracic aortic aneurysm, without rupture: Secondary | ICD-10-CM

## 2021-04-16 NOTE — Telephone Encounter (Signed)
Patient was returning call 

## 2021-04-16 NOTE — Telephone Encounter (Signed)
Left message to call back and ask to speak with preop team.  Corrin Parker, PA-C 04/16/2021 11:30 AM

## 2021-04-16 NOTE — Telephone Encounter (Signed)
Hi Dr. Johney Frame,  Ms. Stamant has plans for bariatric surgery in the next couple of months (possibly August 2022). She was admitted from 02/23/2021 to 02/24/2021 with chest pain that improved with Nitro x3 weeks. EKG showed no acute ischemic changes. High-sensitivity troponin was negative. Prior Myoview in 01/2020 was low risk with no evidence of ischemia. She was seen by Cardiology and outpatient coronary CTA was arranged. However, this ultimately was cancelled given history of atrial fibrillation and BMI >60 which increases her risk of a non-diagnostic scan. Patient was seen by [facility] for follow-up on 03/29/2021 at which point she denied any recurrent chest pain. It was felt that it was likely musculoskeletal from the way she ambulates with her cane. I called and spoke with patient today. She reported that she was doing well but had recurrently left shoulder/side pain on Sunday after being in the kitchen for 8 hours cooking for Father's Day. She noticed it especially when she went to lay down. She has chronic dyspnea on exertion and chronic orthopnea which has been stable over the last couple of years. No palpitations, lightheadedness, dizziness, or syncope. Chest pain sounds atypical. But given there was plans for coronary CTA at recent discharge, would you recommend any additional ischemic evaluation (such as repeat Myoview) prior to surgery??  Please route response back to P CV DIV PREOP.  Thank you! Shakti Fleer

## 2021-04-24 NOTE — Procedures (Signed)
   NAME: [name] DATE OF BIRTH:  1955-11-09 MEDICAL RECORD NUMBER [id number]  LOCATION: [facility]  PHYSICIAN: Deretha Emory  DATE OF STUDY: 04/15/2021  SLEEP STUDY TYPE: Positive Airway Pressure Titration               REFERRING PHYSICIAN: Deretha Emory, MD/Jennifer Yehuda Mao, PA-c  EPWORTH SLEEPINESS SCORE:  7 HEIGHT: 5\' 2"  (157.5 cm)  WEIGHT: (!) 303 lb (137.4 kg)    Body mass index is 55.42 kg/m.  NECK SIZE: 15.5 in.  CLINICAL INFORMATION The patient was referred to the sleep center PAP titration. HSAT showed severe OSA and severe hypoxemia. Also has elevated HCO3 so likely has obesity hypoventilation syndrome.   MEDICATIONS No sleep medicine administered.  SLEEP STUDY TECHNIQUE The patient underwent an attended overnight polysomnography titration to assess the effects of cpap therapy. The following variables were monitored: EEG (C4-A1, C3-A2, O1-A2, O2-A1, F3-M2, F4-M1), EOG, submental and leg EMG, ECG, oxyhemoglobin saturation by pulse oximetry, thoracic and abdominal respiratory effort belts, nasal/oral airflow by pressure sensor, body position sensor and snoring sensor. CPAP pressure was titrated to eliminate apneas, hypopneas and oxygen desaturation.  TECHNICAL COMMENTS Comments added by Technician: PATIENT WAS ORDERED AS A CPAP TITRATION Comments added by Scorer: N/A  SLEEP ARCHITECTURE The study was initiated at 10:32:58 PM and terminated at 5:30:22 AM. Total recorded time was 417.4 minutes. EEG confirmed total sleep time was 245.5 minutes yielding a sleep efficiency of 58.8%%. Sleep onset after lights out was 3.4 minutes with a REM latency of 372.0 minutes. The patient spent 18.1%% of the night in stage N1 sleep, 81.7%% in stage N2 sleep, 0.0%% in stage N3 and 0.2% in REM. The Arousal Index was 33.0/hour.  RESPIRATORY PARAMETERS The overall AHI was 14.2 per hour, and the RDI was 28.6 events/hour with a central apnea index of 0.7per  hour. The patient was incompletely titrated, but oxygenation was adequate at best pressure. The final setting of BiPAP was IPAP/EPAP 23/19 cm H2O. At this setting: - the sleep efficiency was 40% and the patient was supine for 100% - the AHI was 17.3 events per hour - the RDI was 26 events/hour (with 0.7 central events) - the arousal index was 17.3 per hour - and the oxygen nadir was 90.0%.  LEG MOVEMENT DATA The total leg movements were 342 with a resulting leg movement index of 83.6. Associated arousal with leg movement index was 1.7.  CARDIAC DATA The underlying cardiac rhythm was most consistent with sinus rhythm. Mean heart rate during sleep was 64.1 bpm. Additional rhythm abnormalities include PVCs.  IMPRESSIONS - Severe Obstructive Sleep Apnea (OSA) Inadequate pressure attained. - No Significant Central Sleep Apnea (CSA) - Significant leg movements (LMs) during sleep. However, no significant associated arousals.  DIAGNOSIS - Obstructive Sleep Apnea (G47.33)  RECOMMENDATIONS - Trial of BiPAP therapy on 23/19 cm H2O with a Medium size Resmed Full Face Mask AirFit F20 mask and heated humidification. She may not be adequately controlled, and she may need a repeat PAP titration  04-05-1979 Sleep specialist, American Board of Internal Medicine  ELECTRONICALLY SIGNED ON:  04/24/2021, 6:44 AM [facility] PH: [phone]   FX: [phone] ACCREDITED BY THE AMERICAN ACADEMY OF SLEEP MEDICINE

## 2021-04-26 NOTE — Telephone Encounter (Signed)
Patient returned call and scheduler scheduled her a follow up appointment scheduled at our [place] office.

## 2021-04-26 NOTE — Telephone Encounter (Signed)
Primary Cardiologist:Mahesh A Chandrasekhar, MD  Chart reviewed as part of pre-operative protocol coverage. Because of [name]'s past medical history and time since last visit, he/she will require a follow-up visit in order to better assess preoperative cardiovascular risk.  Pre-op covering staff: - Please schedule appointment and call patient to inform them. - Please contact requesting surgeon's office via preferred method (i.e, phone, fax) to inform them of need for appointment prior to surgery.  If applicable, this message will also be routed to pharmacy pool and/or primary cardiologist for input on holding anticoagulant/antiplatelet agent as requested below so that this information is available at time of patient's appointment.   Ronney Asters, NP  04/26/2021, 10:50 AM

## 2021-04-26 NOTE — Telephone Encounter (Signed)
Please refer to Dr Flossie Buffy or general cardiologist for further assessment.

## 2021-04-26 NOTE — Telephone Encounter (Signed)
Left message for patient to contact office to get pre op clearance appt scheduled.

## 2021-04-30 ENCOUNTER — Ambulatory Visit (HOSPITAL_COMMUNITY)
Admission: RE | Admit: 2021-04-30 | Discharge: 2021-04-30 | Disposition: A | Discharge status: Home / Self Care | Payer: Medicare Other | Source: Ambulatory Visit | Attending: Surgery | Admitting: Surgery

## 2021-04-30 ENCOUNTER — Other Ambulatory Visit: Payer: Self-pay

## 2021-04-30 ENCOUNTER — Other Ambulatory Visit (INDEPENDENT_AMBULATORY_CARE_PROVIDER_SITE_OTHER): Payer: Self-pay | Admitting: Family Medicine

## 2021-04-30 DIAGNOSIS — K449 Diaphragmatic hernia without obstruction or gangrene: Secondary | ICD-10-CM | POA: Diagnosis not present

## 2021-04-30 DIAGNOSIS — F3289 Other specified depressive episodes: Secondary | ICD-10-CM

## 2021-04-30 DIAGNOSIS — K224 Dyskinesia of esophagus: Secondary | ICD-10-CM | POA: Diagnosis not present

## 2021-04-30 DIAGNOSIS — Z01818 Encounter for other preprocedural examination: Secondary | ICD-10-CM | POA: Diagnosis not present

## 2021-05-13 ENCOUNTER — Encounter (HOSPITAL_BASED_OUTPATIENT_CLINIC_OR_DEPARTMENT_OTHER): Payer: Self-pay | Admitting: Family

## 2021-05-13 ENCOUNTER — Ambulatory Visit (HOSPITAL_BASED_OUTPATIENT_CLINIC_OR_DEPARTMENT_OTHER): Payer: Medicare Other | Admitting: Family

## 2021-05-13 ENCOUNTER — Other Ambulatory Visit: Payer: Self-pay

## 2021-05-13 VITALS — BP 136/70 | HR 84 | Ht 62.0 in

## 2021-05-13 DIAGNOSIS — Z7901 Long term (current) use of anticoagulants: Secondary | ICD-10-CM | POA: Diagnosis not present

## 2021-05-13 DIAGNOSIS — Z01810 Encounter for preprocedural cardiovascular examination: Secondary | ICD-10-CM

## 2021-05-13 DIAGNOSIS — I4811 Longstanding persistent atrial fibrillation: Secondary | ICD-10-CM

## 2021-05-13 DIAGNOSIS — G4733 Obstructive sleep apnea (adult) (pediatric): Secondary | ICD-10-CM

## 2021-05-13 DIAGNOSIS — I700 Atherosclerosis of aorta: Secondary | ICD-10-CM | POA: Diagnosis not present

## 2021-05-13 NOTE — Progress Notes (Signed)
Office Visit    Patient Name: [name] Date of Encounter: 05/13/2021  PCP:  Laurann Montana, MD   [facility]  Cardiologist:  [name], MD  Advanced Practice Provider:  No care team member to display Electrophysiologist:  Hillis Range, MD    Chief Complaint    [name] is a 65 y.o. female with a hx of hypertension, aortic atherosclerosis, DVT, permanent atrial fibrillation on chronic anticoagulation, PVC, morbid obesity, osteomyelitis, OSA presents today for preoperative cardiovascular clearance  Past Medical History    Past Medical History:  Diagnosis Date   Atrial fibrillation (HCC)    Chronic lower back pain    Depression    DVT (deep venous thrombosis) (HCC) 1990s   LLE   Fallen arches    Hypertension    Hyperthyroidism ~ 2000   "fine now" (04/23/2016)   Joint pain    Lactose intolerance    Leg edema    Obesity    Pneumonia 1980s X 1   Snoring    has not had sleep study but suspects sleep apnea   Past Surgical History:  Procedure Laterality Date   CARDIOVERSION N/A 06/18/2016   Procedure: CARDIOVERSION;  Surgeon: Laurey Morale, MD;  Location: [facility] ENDOSCOPY;  Service: Cardiovascular;  Laterality: N/A;   CARDIOVERSION N/A 07/11/2016   Procedure: CARDIOVERSION;  Surgeon: Lewayne Bunting, MD;  Location: [facility] ENDOSCOPY;  Service: Cardiovascular;  Laterality: N/A;   CARDIOVERSION N/A 07/02/2017   Procedure: CARDIOVERSION;  Surgeon: Thurmon Fair, MD;  Location: [facility] ENDOSCOPY;  Service: Cardiovascular;  Laterality: N/A;   CARDIOVERSION N/A 09/29/2017   Procedure: CARDIOVERSION;  Surgeon: Laurey Morale, MD;  Location: [facility] ENDOSCOPY;  Service: Cardiovascular;  Laterality: N/A;   ENDOMETRIAL ABLATION  ~2006   TONSILLECTOMY  1960s   TUBAL LIGATION  1979    Allergies  Allergies  Allergen Reactions   Amlodipine Besylate     Other reaction(s): edema at 10 mg dose   Codeine Hives   Latex    Triamcinolone      Other reaction(s): nausea    History of Present Illness    [name] is a 65 y.o. female with a hx of hypertension, DVT, permanent atrial fibrillation on chronic anticoagulation, PVC, morbid obesity, osteomyelitis, OSA, aortic atherosclerosis last seen 04/25/2021 by Francis Dowse, PA.  Previous history of osteomyelitis of her right foot requiring multiple surgeries in 2019 and immobility   She has followed routinely with Dr. Johney Frame in regards to her atrial fibrillation.  Previous work-up for atypical chest pain includes stress test April 2021 with no evidence of ischemia.  Echocardiogram April 2021 LVEF 55-60%, no R WMA, moderate LVH, normal PASP, LA severely dilated, mild to moderate MR.  The patient did request further work-up with cardiac CTA but was discussed with cardiology reader team who did not think with her atrial fibrillation and BMI that diagnostic images would be able to be obtained.   ED visit 02/09/2021 for chest pain with CT angio chest aorta showing mild aneurysmal dilation to 4.2 cm in the ascending aorta recommended for annual imaging, mild dependent atelectatic changes in the lungs bilaterally, aortic atherosclerosis.  Report does not metion coronary atherosclerosis.  Subsequent hospitalization 02/23/2021 for chest pain ongoing for 3 weeks under left breast with radiation to back and left arm.  HS troponin negative x2. Hher chest pain resolved was determined to be musculoskeletal from use of her cane with her left arm.  She presents  today for follow-up and preoperative clearance for bariatric surgery. She has no chest pain, pressure, tightness. Notes dyspnea on exertion which she attributes to her weight. Tells me this is improving overall.  She has been able to ambulate with a cane for exercise and is gradually improving her distance with walking. She did have a sleeps study and was recommended for BiPAP and is waiting on a call on how to set up.   EKGs/Labs/Other Studies  Reviewed:   The following studies were reviewed today:  Echo 02/23/20  1. Left ventricular ejection fraction, by estimation, is 55 to 60%. The  left ventricle has normal function. The left ventricle has no regional  wall motion abnormalities. There is moderate left ventricular hypertrophy.  Left ventricular diastolic function   could not be evaluated.   2. Right ventricular systolic function is normal. The right ventricular  size is normal. There is normal pulmonary artery systolic pressure.   3. Left atrial size was severely dilated.   4. The mitral valve is abnormal. Mild to moderate mitral valve  regurgitation.   5. The aortic valve is tricuspid. Aortic valve regurgitation is not  visualized.   6. Aortic dilatation noted. There is mild dilatation of the ascending  aorta measuring 42 mm.   7. The inferior vena cava is normal in size with greater than 50%  respiratory variability, suggesting right atrial pressure of 3 mmHg.   Myoview 02/23/20  The left ventricular ejection fraction is normal (55-65%). Nuclear stress EF: 49%. There was no ST segment deviation noted during stress. The study is normal. This is a low risk study.   Normal pharmacologic nuclear stress test with no evidence for prior infarct or ischemia. LVEF 55%. Frequent PVCs during stress and in recovery.  CT Angio Chest Aorta 01/2021  IMPRESSION: No evidence of aortic dissection.   Mild aneurysmal dilatation to 4.2 cm in the ascending aorta. Recommend annual imaging followup by CTA or MRA. This recommendation follows 2010 ACCF/AHA/AATS/ACR/ASA/SCA/SCAI/SIR/STS/SVM Guidelines for the Diagnosis and Management of Patients with Thoracic Aortic Disease. Circulation. 2010; 121: K240-X735. Aortic aneurysm NOS (ICD10-I71.9)   Mild dependent atelectatic changes in the lungs bilaterally.   Aortic Atherosclerosis (ICD10-I70.0). EKG:  EKG is ordered today.  The ekg ordered today demonstrates rate controlled atrial  fibrillation 84 bpm with PVC's. No acute ST/T wave changes.   Recent Labs: 08/15/2020: TSH 4.50 02/24/2021: ALT 15; BUN 18; Creatinine, Ser 1.03; Hemoglobin 11.2; Magnesium 1.8; Platelets 250; Potassium 4.2; Sodium 138  Recent Lipid Panel    Component Value Date/Time   CHOL 163 02/24/2021 0221   CHOL 157 04/25/2020 1328   TRIG 47 02/24/2021 0221   HDL 72 02/24/2021 0221   HDL 67 04/25/2020 1328   CHOLHDL 2.3 02/24/2021 0221   VLDL 9 02/24/2021 0221   LDLCALC 82 02/24/2021 0221   LDLCALC 74 04/25/2020 1328    Risk Assessment/Calculations:   CHA2DS2-VASc Score = 3  This indicates a 3.2% annual risk of stroke. The patient's score is based upon: CHF History: No HTN History: Yes Diabetes History: No Stroke History: No Vascular Disease History: Yes Age Score: 0 Gender Score: 1    Home Medications   Current Meds  Medication Sig   acetaminophen (TYLENOL) 325 MG tablet Take 2 tablets by mouth every 4 (four) hours as needed for pain.   Ascorbic Acid (VITAMIN C) 1000 MG tablet Take 1,000 mg by mouth daily.   buPROPion (WELLBUTRIN SR) 200 MG 12 hr tablet Take 1 tablet (200 mg  total) by mouth daily.   DULoxetine (CYMBALTA) 30 MG capsule Take 30 mg by mouth daily. Takes with 60mg  capsule to total 90mg  daily.   DULoxetine (CYMBALTA) 60 MG capsule Take 60 mg by mouth daily. Takes with 30mg  dose for a total dose of 90mg  daily.   gabapentin (NEURONTIN) 400 MG capsule Take 400 mg by mouth 3 (three) times daily.   metoprolol tartrate (LOPRESSOR) 25 MG tablet TAKE 1/2 TABLET(12.5 MG TOTAL) BY MOUTH TWICE DAILY   triamterene-hydrochlorothiazide (MAXZIDE-25) 37.5-25 MG tablet Take 1 tablet by mouth every morning.    XARELTO 20 MG TABS tablet TAKE 1 TABLET(20 MG) BY MOUTH DAILY WITH SUPPER     Review of Systems   All other systems reviewed and are otherwise negative except as noted above.  Physical Exam    VS:  BP 136/70 (BP Location: Left Arm, Patient Position: Sitting, Cuff Size: Large)    Pulse 84   Ht 5\' 2"  (1.575 m)   SpO2 95%   BMI 55.42 kg/m  , BMI Body mass index is 55.42 kg/m.  Wt Readings from Last 3 Encounters:  04/15/21 (!) 303 lb (137.4 kg)  03/29/21 (!) 335 lb (152 kg)  02/24/21 (!) 331 lb 12.8 oz (150.5 kg)     GEN: Well nourished, well developed, in no acute distress. HEENT: normal. Neck: Supple, no JVD, carotid bruits, or masses. Cardiac: IRIR, no murmurs, rubs, or gallops. No clubbing, cyanosis, edema.  Radials/PT 2+ and equal bilaterally.  Respiratory:  Respirations regular and unlabored, clear to auscultation bilaterally. GI: Soft, nontender, nondistended. MS: No deformity or atrophy. Skin: Warm and dry, no rash. Neuro:  Strength and sensation are intact. Psych: Normal affect.  Assessment & Plan    Preoperative cardiovascular clearance - upcoming bariatric surgery. Stress test 01/2020 low risk. No chest pain. Anticipate her dyspnea is related to deconditioning, obesity. EKG today no acute ST/T wave changes. According to the Revised Cardiac Risk Index (RCRI), her Perioperative Risk of Major Cardiac Event is (%): 0.9. Her Functional Capacity in METs is: 5.72 according to the Duke Activity Status Index (DASI). She is deemed acceptable risk for the planned procedure with additional cardiovascular testing. Per pharmacy review she may hold Xarelto for 2 days prior to planned procedure and will not need bridging with Lovenox. Will route to surgeon's office so they are aware.   Aortic atherosclerosis - Noted by CT angio 01/2021. Recommend addition of statin, detailed below. No aspirin due to chronic anticoagulation.   Permanent atrial fibrillation / Chronic anticoagulation - Rate controlled today by EKG. Conitnue current dose Lopressor 12.5mg  BID. Continue Xarelto 20mg  QD for anticoagulation. Denies bleeding complications.   HLD - Given aortic atherosclerosis recommend initiation of statin. 04/11/21 LDL 83, HDL 78, total cholesterol 05/29/21. She is hesitant to  change medication prior to bariatric surgery. Encouraged to consider addition of statin and will discuss again at follow up.   OSA - Recent diagnosis with plans to start BIPAP. Discussed the importance of BIPAP therapy in heart health.  Obesity - Upcoming bariatric surgery. Weight loss via diet and exercise encouraged. Discussed the impact being overweight would have on cardiovascular risk.   Disposition: Follow up  December 2022  with Dr. 02/2020 or APP.  Signed, 02/2021, NP 05/13/2021, 8:11 PM [facility]

## 2021-05-13 NOTE — Patient Instructions (Signed)
Medication Instructions:  Continue your current medications.   You may hold your Xarelto 2 days prior to your surgery *If you need a refill on your cardiac medications before your next appointment, please call your pharmacy*   Lab Work: None today  If you have labs (blood work) drawn today and your tests are completely normal, you will receive your results only by: MyChart Message (if you have MyChart) OR A paper copy in the mail If you have any lab test that is abnormal or we need to change your treatment, we will call you to review the results.   Testing/Procedures: None ordered today   Follow-Up: At [facility], you and your health needs are our priority.  As part of our continuing mission to provide you with exceptional heart care, we have created designated Provider Care Teams.  These Care Teams include your primary Cardiologist (physician) and Advanced Practice Providers (APPs -  Physician Assistants and Nurse Practitioners) who all work together to provide you with the care you need, when you need it.  We recommend signing up for the patient portal called "MyChart".  Sign up information is provided on this After Visit Summary.  MyChart is used to connect with patients for Virtual Visits (Telemedicine).  Patients are able to view lab/test results, encounter notes, upcoming appointments, etc.  Non-urgent messages can be sent to your provider as well.   To learn more about what you can do with MyChart, go to [email or web address].    Your next appointment:   In 09/2021 or 10/2021 with Dr. Johney Frame   Other Instructions  Heart Healthy Diet Recommendations: A low-salt diet is recommended. Meats should be grilled, baked, or boiled. Avoid fried foods. Focus on lean protein sources like fish or chicken with vegetables and fruits. The American Heart Association is a Chief Technology Officer!  American Heart Association Diet and Lifeystyle Recommendations   Exercise recommendations: The  American Heart Association recommends 150 minutes of moderate intensity exercise weekly. Try 30 minutes of moderate intensity exercise 4-5 times per week. This could include walking, jogging, or swimming.

## 2021-05-22 ENCOUNTER — Encounter: Payer: Self-pay | Admitting: Skilled Nursing Facility1

## 2021-05-22 ENCOUNTER — Encounter: Payer: Medicare Other | Attending: Surgery | Admitting: Skilled Nursing Facility1

## 2021-05-22 ENCOUNTER — Other Ambulatory Visit: Payer: Self-pay

## 2021-05-22 DIAGNOSIS — I10 Essential (primary) hypertension: Secondary | ICD-10-CM | POA: Diagnosis not present

## 2021-05-22 DIAGNOSIS — Z713 Dietary counseling and surveillance: Secondary | ICD-10-CM | POA: Diagnosis not present

## 2021-05-22 DIAGNOSIS — Z6841 Body Mass Index (BMI) 40.0 and over, adult: Secondary | ICD-10-CM | POA: Insufficient documentation

## 2021-05-22 DIAGNOSIS — Z86718 Personal history of other venous thrombosis and embolism: Secondary | ICD-10-CM | POA: Diagnosis present

## 2021-05-22 DIAGNOSIS — G4730 Sleep apnea, unspecified: Secondary | ICD-10-CM | POA: Diagnosis not present

## 2021-05-22 NOTE — Progress Notes (Signed)
Nutrition Assessment for Bariatric Surgery Medical Nutrition Therapy  Patient was seen on 05/22/2021 for Pre-Operative Nutrition Assessment. Letter of approval faxed to [facility] bariatric surgery program coordinator on 05/22/2021.   Referral stated Supervised Weight Loss (SWL) visits needed: 6: pt states she has completed these with healthy weight and wellness  Pt completed visits.   Pt has cleared nutrition requirements.   Planned surgery: sleeve gastrectomy  Pt expectation of surgery: to live long enough to see her grandchildren raised Pt expectation of dietitian: none stated    NUTRITION ASSESSMENT   Anthropometrics  Start weight at [facility]: 327.9 lbs (date: 05/22/2021)  Height: 62 in BMI: 59.97 kg/m2     Clinical  Medical hx: DVT, HTN, depression, sleep apnea Medications: see list  Labs: albumin 3.3, creatinine 1.03, calcium 8.8, hemoglobin 11.2, MCH 24.8, RDW 16.2 Notable signs/symptoms: pain, limited mobility, diarrhea not often Any previous deficiencies? No  Micronutrient Nutrition Focused Physical Exam: Hair: No issues observed Eyes: No issues observed Mouth: No issues observed Neck: No issues observed Nails: No issues observed Skin: No issues observed  Lifestyle & Dietary Hx  Pt states she has been raising 3 grandchildren since 2008 which is her main motivation for her getting the surgery.  Pt states she sees a psychiatrist for medication management and does do talk therapy.  Pt states she does have a problem with waiting too long to eat.  Pt state she enjoys a sweet treat with her coffee which she is working on. Pt states she sometimes fasts for religous purposes.  Pt states she uses small plates to help with portion sizes. Pt states she is aware her sweet tooth is her problem.   24-Hr Dietary Recall First Meal: poptart or skipped Snack:  Second Meal: skipped or sandwich + yogurt Snack: granola bars Third Meal: potatoes + roast  Snack: sweets  or chips Beverages: black coffee, water   Estimated Energy Needs Calories: 1500   NUTRITION DIAGNOSIS  Overweight/obesity ([place]-3.3) related to past poor dietary habits and physical inactivity as evidenced by patient w/ planned sleeve gastrectomy surgery following dietary guidelines for continued weight loss.    NUTRITION INTERVENTION  Nutrition counseling (C-1) and education (E-2) to facilitate bariatric surgery goals.  Educated pt on micronutrient deficiencies post surgery and strategies to mitigate that risk   Pre-Op Goals Reviewed with the Patient Track food and beverage intake (pen and paper, MyFitness Pal, Baritastic app, etc.) Make healthy food choices while monitoring portion sizes Consume 3 meals per day or try to eat every 3-5 hours Avoid concentrated sugars and fried foods Keep sugar & fat in the single digits per serving on food labels Practice CHEWING your food (aim for applesauce consistency) Practice not drinking 15 minutes before, during, and 30 minutes after each meal and snack Avoid all carbonated beverages (ex: soda, sparkling beverages)  Limit caffeinated beverages (ex: coffee, tea, energy drinks) Avoid all sugar-sweetened beverages (ex: regular soda, sports drinks)  Avoid alcohol  Aim for 64-100 ounces of FLUID daily (with at least half of fluid intake being plain water)  Aim for at least 60-80 grams of PROTEIN daily Look for a liquid protein source that contains ?15 g protein and ?5 g carbohydrate (ex: shakes, drinks, shots) Make a list of non-food related activities Physical activity is an important part of a healthy lifestyle so keep it moving! The goal is to reach 150 minutes of exercise per week, including cardiovascular and weight baring activity.  *Goals that are bolded indicate the  pt would like to start working towards these  Handouts Provided Include  Bariatric Surgery handouts (Nutrition Visits, Pre-Op Goals, Protein Shakes, Vitamins &  Minerals)  Learning Style & Readiness for Change Teaching method utilized: Visual & Auditory  Demonstrated degree of understanding via: Teach Back  Readiness Level: contemplative  Barriers to learning/adherence to lifestyle change:      MONITORING & EVALUATION Dietary intake, weekly physical activity, body weight, and pre-op goals reached at next nutrition visit.    Next Steps  Patient is to follow up at [facility] for Pre-Op Class >2 weeks before surgery for further nutrition education.   Pt has completed visits. No further supervised visits required

## 2021-05-24 ENCOUNTER — Ambulatory Visit (INDEPENDENT_AMBULATORY_CARE_PROVIDER_SITE_OTHER): Payer: Medicare Other | Admitting: Psychology

## 2021-05-24 DIAGNOSIS — F509 Eating disorder, unspecified: Secondary | ICD-10-CM

## 2021-06-05 ENCOUNTER — Ambulatory Visit (INDEPENDENT_AMBULATORY_CARE_PROVIDER_SITE_OTHER): Payer: Medicare Other | Admitting: Psychology

## 2021-06-05 DIAGNOSIS — F509 Eating disorder, unspecified: Secondary | ICD-10-CM | POA: Diagnosis not present

## 2021-06-10 ENCOUNTER — Ambulatory Visit: Payer: Self-pay | Admitting: Surgery

## 2021-06-19 ENCOUNTER — Other Ambulatory Visit: Payer: Self-pay

## 2021-06-19 ENCOUNTER — Ambulatory Visit (HOSPITAL_BASED_OUTPATIENT_CLINIC_OR_DEPARTMENT_OTHER): Payer: Medicare Other | Attending: Surgery | Admitting: Physical Therapy

## 2021-06-19 ENCOUNTER — Encounter (HOSPITAL_BASED_OUTPATIENT_CLINIC_OR_DEPARTMENT_OTHER): Payer: Self-pay | Admitting: Physical Therapy

## 2021-06-19 DIAGNOSIS — R262 Difficulty in walking, not elsewhere classified: Secondary | ICD-10-CM | POA: Insufficient documentation

## 2021-06-19 DIAGNOSIS — M6281 Muscle weakness (generalized): Secondary | ICD-10-CM | POA: Diagnosis not present

## 2021-06-19 NOTE — Therapy (Signed)
[facility] [address] [place], [place], [address] Phone: [phone]   Fax:  [phone]  Physical Therapy Evaluation  Patient Details  Name: [name] MRN: [id number] Date of Birth: [birth year] Referring Provider (PT): Berna Bue, MD   Encounter Date: 06/19/2021   PT End of Session - 06/19/21 1320     Visit Number 1    Number of Visits 13    Date for PT Re-Evaluation 08/02/21    Authorization Type UHC MCR    PT Start Time 1303    PT Stop Time 1343    PT Time Calculation (min) 40 min    Activity Tolerance Patient tolerated treatment well    Behavior During Therapy [facility] for tasks assessed/performed             Past Medical History:  Diagnosis Date   Atrial fibrillation (HCC)    Chronic lower back pain    Depression    DVT (deep venous thrombosis) (HCC) 1990s   LLE   Fallen arches    Hypertension    Hyperthyroidism ~ 2000   "fine now" (04/23/2016)   Joint pain    Lactose intolerance    Leg edema    Obesity    Pneumonia 1980s X 1   Snoring    has not had sleep study but suspects sleep apnea    Past Surgical History:  Procedure Laterality Date   CARDIOVERSION N/A 06/18/2016   Procedure: CARDIOVERSION;  Surgeon: Laurey Morale, MD;  Location: [facility] ENDOSCOPY;  Service: Cardiovascular;  Laterality: N/A;   CARDIOVERSION N/A 07/11/2016   Procedure: CARDIOVERSION;  Surgeon: Lewayne Bunting, MD;  Location: [facility] ENDOSCOPY;  Service: Cardiovascular;  Laterality: N/A;   CARDIOVERSION N/A 07/02/2017   Procedure: CARDIOVERSION;  Surgeon: Thurmon Fair, MD;  Location: [facility] ENDOSCOPY;  Service: Cardiovascular;  Laterality: N/A;   CARDIOVERSION N/A 09/29/2017   Procedure: CARDIOVERSION;  Surgeon: Laurey Morale, MD;  Location: [facility] ENDOSCOPY;  Service: Cardiovascular;  Laterality: N/A;   ENDOMETRIAL ABLATION  ~2006   TONSILLECTOMY  1960s   TUBAL LIGATION  1979    There were no vitals filed for this  visit.    Subjective Assessment - 06/19/21 1314     Subjective Was off of foot for about 3 years and started walking again in Aug of last year. I gained a lot of weight in those 3 years which has been a hinderence. Walk for exercise- around the house mostly. Limited in finding a place to walk due to finding stairs. Started back to school recently online.    How long can you stand comfortably? an hour or so like in cooking a big meal    Patient Stated Goals have more endurance, incr strength, carry as I move/multifunction (need hlep carrying laundry basket)    Currently in Pain? Yes    Pain Score 7    around the end of the day   Pain Location Foot    Pain Orientation Right    Pain Descriptors / Indicators Aching    Aggravating Factors  weight bearing    Pain Relieving Factors rest                [facility] PT Assessment - 06/19/21 0001       Assessment   Medical Diagnosis pain in unspecified joint, morbid obesity    Referring Provider (PT) Berna Bue, MD    Onset Date/Surgical Date --   chronic     Precautions  Precautions None      Restrictions   Weight Bearing Restrictions No      Balance Screen   Has the patient fallen in the past 6 months No      Home Environment   Living Environment Private residence    Living Arrangements Spouse/significant other;Other relatives    Additional Comments grand children      Prior Function   Level of Independence Independent    Vocation On disability;Student    Vocation Requirements bachelors in communications    Leisure raising 3 grand children teenagers      Cognition   Overall Cognitive Status Within Functional Limits for tasks assessed      Sensation   Additional Comments numbness in Lt foot, burning after sitting about 45 min      Strength   Overall Strength Comments gross GHJ strength 4-/5 on Rt, 4/5 Lt      Ambulation/Gait   Gait Comments Lt trendenlenburg      6 Minute walk- Post Test   6 Minute Walk Post  Test yes    Modified Borg Scale for Dyspnea 7- Severe shortness of breath or very hard breathing    Perceived Rate of Exertion (Borg) 17- Very hard      6 minute walk test results    Aerobic Endurance Distance Walked 529    Endurance additional comments 6 standing rest breaks, 1 seated rest break, SPC in Lt hand      Standardized Balance Assessment   Standardized Balance Assessment Five Times Sit to Stand    Five times sit to stand comments  14s      High Level Balance   High Level Balance Comments Rt able to hold 10s, Lt unstable and required UE assist                        Objective measurements completed on examination: See above findings.               PT Education - 06/19/21 1938     Education Details anatomy of condition, POC, aquatics    Person(s) Educated Patient    Methods Explanation    Comprehension Verbalized understanding;Need further instruction              PT Short Term Goals - 06/19/21 1945       PT SHORT TERM GOAL #1   Title pt will verbalize ability to utilize postures and muscle activations to support upright postures    Baseline will educate and establish    Time 2    Period Weeks    Status New    Target Date 07/05/21               PT Long Term Goals - 06/19/21 1944       PT LONG TERM GOAL #1   Title pt will be able to carry a laundry basket to her couch    Baseline requires help at eval    Time 6    Period Weeks    Status New    Target Date 08/02/21      PT LONG TERM GOAL #2   Title 5TSTS to 12s or less    Baseline 14s at eval    Time 6    Period Weeks    Status New    Target Date 08/02/21      PT LONG TERM GOAL #3   Title to improve by [facility]  Baseline see flowsheet    Time 6    Period Weeks    Status New    Target Date 08/02/21                    Plan - 06/19/21 1938     Clinical Impression Statement Pt presents to PT with diagnosis of obesity and joint pain. She mostly  has numbness with pain in Rt foot- significant arch collapse noted. Gross limitations in strength and activity tolerance. Trendelenburg in Lt stance phase with use of cane to her Lt in gait. Will focus on use of aquatic therapy in order to decrease WB through LE joints and increase tolerance to strength and endurance activities.    Personal Factors and Comorbidities Comorbidity 3+    Comorbidities afib, obesity, depression, h/o Lt foot and Rt shoulder sepsis- per pt, collapsing arches bil, chronic LBP    Examination-Activity Limitations Bathing;Locomotion Level;Reach Overhead;Sit;Caring for Others;Carry;Stand;Lift    Examination-Participation Restrictions Meal Prep;Cleaning;Occupation;Driving;Laundry    Stability/Clinical Decision Making Evolving/Moderate complexity    Clinical Decision Making Moderate    Rehab Potential Fair    PT Frequency 2x / week    PT Duration 6 weeks    PT Treatment/Interventions ADLs/Self Care Home Management;Aquatic Therapy;Cryotherapy;Gait training;Moist Heat;Stair training;Functional mobility training;Therapeutic activities;Therapeutic exercise;Balance training;Neuromuscular re-education;Manual techniques;Patient/family education;Passive range of motion;Dry needling;Taping    PT Next Visit Plan aquatic therapy    PT Home Exercise Plan to be established    Consulted and Agree with Plan of Care Patient             Patient will benefit from skilled therapeutic intervention in order to improve the following deficits and impairments:  Abnormal gait, Difficulty walking, Impaired UE functional use, Decreased endurance, Decreased activity tolerance, Pain, Decreased balance, Decreased strength, Impaired sensation  Visit Diagnosis: Difficulty in walking, not elsewhere classified - Plan: PT plan of care cert/re-cert  Muscle weakness (generalized) - Plan: PT plan of care cert/re-cert     Problem List Patient Active Problem List   Diagnosis Date Noted   Insulin  resistance 05/11/2020   DOE (dyspnea on exertion) 02/09/2020   Depression 09/26/2019   Vitamin D deficiency 09/01/2019   Class 3 severe obesity with serious comorbidity and body mass index (BMI) of 50.0 to 59.9 in adult [facility]) 09/01/2019   Prediabetes 08/15/2019   Atrial fibrillation (HCC)    Chest pain 04/22/2016   Hypokalemia 04/22/2016   Morbid obesity (HCC) 04/22/2016   Hypertension 04/22/2016   Paroxysmal A-fib (HCC) 04/22/2016   Hypomagnesemia 04/22/2016   Harvis Mabus C. Jaiyden Laur PT, DPT 06/19/21 7:50 PM   [facility] [address] [place], [place], [address] Phone: [phone]   Fax:  [phone]  Name: ASHA GRUMBINE MRN: [id number] Date of Birth: 1956-05-13

## 2021-06-24 ENCOUNTER — Ambulatory Visit (HOSPITAL_BASED_OUTPATIENT_CLINIC_OR_DEPARTMENT_OTHER): Payer: Medicare Other | Admitting: Physical Therapy

## 2021-06-24 ENCOUNTER — Other Ambulatory Visit: Payer: Self-pay

## 2021-06-24 ENCOUNTER — Encounter (HOSPITAL_BASED_OUTPATIENT_CLINIC_OR_DEPARTMENT_OTHER): Payer: Self-pay | Admitting: Physical Therapy

## 2021-06-24 DIAGNOSIS — M6281 Muscle weakness (generalized): Secondary | ICD-10-CM

## 2021-06-24 DIAGNOSIS — R262 Difficulty in walking, not elsewhere classified: Secondary | ICD-10-CM | POA: Diagnosis not present

## 2021-06-24 NOTE — Therapy (Signed)
[facility] [address] [place], [place], [address] Phone: [phone]   Fax:  [phone]  Physical Therapy Treatment  Patient Details  Name: [name] MRN: [id number] Date of Birth: [birth year] Referring Provider (PT): Berna Bue, MD   Encounter Date: 06/24/2021   PT End of Session - 06/24/21 0932     Visit Number 2    Number of Visits 13    Date for PT Re-Evaluation 08/02/21    Authorization Type UHC MCR    PT Start Time 0930    PT Stop Time 1015   15 min non billed for building evacuation   PT Time Calculation (min) 45 min    Activity Tolerance Patient tolerated treatment well    Behavior During Therapy [facility] for tasks assessed/performed             Past Medical History:  Diagnosis Date   Atrial fibrillation (HCC)    Chronic lower back pain    Depression    DVT (deep venous thrombosis) (HCC) 1990s   LLE   Fallen arches    Hypertension    Hyperthyroidism ~ 2000   "fine now" (04/23/2016)   Joint pain    Lactose intolerance    Leg edema    Obesity    Pneumonia 1980s X 1   Snoring    has not had sleep study but suspects sleep apnea    Past Surgical History:  Procedure Laterality Date   CARDIOVERSION N/A 06/18/2016   Procedure: CARDIOVERSION;  Surgeon: Laurey Morale, MD;  Location: [facility] ENDOSCOPY;  Service: Cardiovascular;  Laterality: N/A;   CARDIOVERSION N/A 07/11/2016   Procedure: CARDIOVERSION;  Surgeon: Lewayne Bunting, MD;  Location: [facility] ENDOSCOPY;  Service: Cardiovascular;  Laterality: N/A;   CARDIOVERSION N/A 07/02/2017   Procedure: CARDIOVERSION;  Surgeon: Thurmon Fair, MD;  Location: [facility] ENDOSCOPY;  Service: Cardiovascular;  Laterality: N/A;   CARDIOVERSION N/A 09/29/2017   Procedure: CARDIOVERSION;  Surgeon: Laurey Morale, MD;  Location: [facility] ENDOSCOPY;  Service: Cardiovascular;  Laterality: N/A;   ENDOMETRIAL ABLATION  ~2006   TONSILLECTOMY  1960s   TUBAL LIGATION  1979     There were no vitals filed for this visit.   Subjective Assessment - 06/24/21 0931     Subjective feet are numb today    Patient Stated Goals have more endurance, incr strength, carry as I move/multifunction (need hlep carrying laundry basket)    Currently in Pain? No/denies              entered water via stairs using bil HR, depts up to 4', temp 94 deg.    AquaticREHABdocumentation: Pt is unable to tolerate land due to pain or inability to move freely on land without pain and substitution/compensations , Water will allow for reduced gait deviation due to reduced joint loading through buoyancy to help patient improve posture without excess stress and pain. , and Hydrostatic pressure also supports joints by unweighting joint load by at least 50 % in 3-4 feet depth water. 80% in chest to neck deep water.  Walking with noodle- fwd, lat & retro Seated LAQ- one leg at a time 2x20 Seated straight arm press down, single water dumbbell Single leg hip circles- hands on side of pool for posture- 3x10/10 ea Tandem weight shift- single water dumbbell in one hand, side of pool under other hand Stretches: gastroc, adductors, hip flexors  PT Short Term Goals - 06/19/21 1945       PT SHORT TERM GOAL #1   Title pt will verbalize ability to utilize postures and muscle activations to support upright postures    Baseline will educate and establish    Time 2    Period Weeks    Status New    Target Date 07/05/21               PT Long Term Goals - 06/19/21 1944       PT LONG TERM GOAL #1   Title pt will be able to carry a laundry basket to her couch    Baseline requires help at eval    Time 6    Period Weeks    Status New    Target Date 08/02/21      PT LONG TERM GOAL #2   Title 5TSTS to 12s or less    Baseline 14s at eval    Time 6    Period Weeks    Status New    Target Date 08/02/21      PT LONG TERM GOAL #3   Title  to improve by [facility]    Baseline see flowsheet    Time 6    Period Weeks    Status New    Target Date 08/02/21                   Plan - 06/24/21 1015     Clinical Impression Statement Aquatic exercises focused on activaing core in gait and stretching which she reports was notable. Will progress next visit based on tol of today.    PT Treatment/Interventions ADLs/Self Care Home Management;Aquatic Therapy;Cryotherapy;Gait training;Moist Heat;Stair training;Functional mobility training;Therapeutic activities;Therapeutic exercise;Balance training;Neuromuscular re-education;Manual techniques;Patient/family education;Passive range of motion;Dry needling;Taping    PT Next Visit Plan aquatic changes based on tolerance to todays exercises    PT Home Exercise Plan to be established    Consulted and Agree with Plan of Care Patient             Patient will benefit from skilled therapeutic intervention in order to improve the following deficits and impairments:  Abnormal gait, Difficulty walking, Impaired UE functional use, Decreased endurance, Decreased activity tolerance, Pain, Decreased balance, Decreased strength, Impaired sensation  Visit Diagnosis: Difficulty in walking, not elsewhere classified  Muscle weakness (generalized)     Problem List Patient Active Problem List   Diagnosis Date Noted   Insulin resistance 05/11/2020   DOE (dyspnea on exertion) 02/09/2020   Depression 09/26/2019   Vitamin D deficiency 09/01/2019   Class 3 severe obesity with serious comorbidity and body mass index (BMI) of 50.0 to 59.9 in adult [facility]) 09/01/2019   Prediabetes 08/15/2019   Atrial fibrillation (HCC)    Chest pain 04/22/2016   Hypokalemia 04/22/2016   Morbid obesity (HCC) 04/22/2016   Hypertension 04/22/2016   Paroxysmal A-fib (HCC) 04/22/2016   Hypomagnesemia 04/22/2016   Kace Hartje C. Forrest Jaroszewski PT, DPT 06/24/21 4:33 PM  [facility] [address] [place], [place], [address] Phone: [phone]   Fax:  [phone]  Name: [name] MRN: [id number] Date of Birth: 1956-01-18

## 2021-06-28 ENCOUNTER — Other Ambulatory Visit: Payer: Self-pay

## 2021-06-28 ENCOUNTER — Encounter (HOSPITAL_BASED_OUTPATIENT_CLINIC_OR_DEPARTMENT_OTHER): Payer: Self-pay | Admitting: Physical Therapy

## 2021-06-28 ENCOUNTER — Ambulatory Visit (HOSPITAL_BASED_OUTPATIENT_CLINIC_OR_DEPARTMENT_OTHER): Payer: Medicare Other | Attending: Surgery | Admitting: Physical Therapy

## 2021-06-28 DIAGNOSIS — R262 Difficulty in walking, not elsewhere classified: Secondary | ICD-10-CM | POA: Diagnosis not present

## 2021-06-28 DIAGNOSIS — M25572 Pain in left ankle and joints of left foot: Secondary | ICD-10-CM | POA: Insufficient documentation

## 2021-06-28 DIAGNOSIS — R600 Localized edema: Secondary | ICD-10-CM | POA: Diagnosis not present

## 2021-06-28 DIAGNOSIS — M6281 Muscle weakness (generalized): Secondary | ICD-10-CM | POA: Diagnosis not present

## 2021-06-28 DIAGNOSIS — M25675 Stiffness of left foot, not elsewhere classified: Secondary | ICD-10-CM | POA: Insufficient documentation

## 2021-06-28 NOTE — Therapy (Signed)
[facility] [address] [place], [place], [address] Phone: [phone]   Fax:  [phone]  Physical Therapy Treatment  Patient Details  Name: [name] MRN: [id number] Date of Birth: Feb 17, 1956 Referring Provider (PT): Berna Bue, MD   Encounter Date: 06/28/2021   PT End of Session - 06/28/21 1016     Visit Number 3    Number of Visits 13    Date for PT Re-Evaluation 08/02/21    Authorization Type UHC MCR    PT Start Time 1014    PT Stop Time 1055    PT Time Calculation (min) 41 min    Activity Tolerance Patient tolerated treatment well    Behavior During Therapy [facility] for tasks assessed/performed             Past Medical History:  Diagnosis Date   Atrial fibrillation (HCC)    Chronic lower back pain    Depression    DVT (deep venous thrombosis) (HCC) 1990s   LLE   Fallen arches    Hypertension    Hyperthyroidism ~ 2000   "fine now" (04/23/2016)   Joint pain    Lactose intolerance    Leg edema    Obesity    Pneumonia 1980s X 1   Snoring    has not had sleep study but suspects sleep apnea    Past Surgical History:  Procedure Laterality Date   CARDIOVERSION N/A 06/18/2016   Procedure: CARDIOVERSION;  Surgeon: Laurey Morale, MD;  Location: [facility] ENDOSCOPY;  Service: Cardiovascular;  Laterality: N/A;   CARDIOVERSION N/A 07/11/2016   Procedure: CARDIOVERSION;  Surgeon: Lewayne Bunting, MD;  Location: [facility] ENDOSCOPY;  Service: Cardiovascular;  Laterality: N/A;   CARDIOVERSION N/A 07/02/2017   Procedure: CARDIOVERSION;  Surgeon: Thurmon Fair, MD;  Location: [facility] ENDOSCOPY;  Service: Cardiovascular;  Laterality: N/A;   CARDIOVERSION N/A 09/29/2017   Procedure: CARDIOVERSION;  Surgeon: Laurey Morale, MD;  Location: [facility] ENDOSCOPY;  Service: Cardiovascular;  Laterality: N/A;   ENDOMETRIAL ABLATION  ~2006   TONSILLECTOMY  1960s   TUBAL LIGATION  1979    There were no vitals filed for this visit.    Subjective Assessment - 06/28/21 1014     Subjective Just a little soreness where I have been working to hold my core in.    Patient Stated Goals have more endurance, incr strength, carry as I move/multifunction (need hlep carrying laundry basket)    Currently in Pain? No/denies                Pt entered pool via stairs using unilat hand rail, depth up to 4 ft, temp 94 deg. AquaticREHABdocumentation: Pt requires the buoyancy of water for active assisted exercises with buoyancy supported for strengthening & ROM exercises, Hydrostatic pressure also supports joints by unweighting joint load by at least 50 % in 3-4 feet depth water. 80% in chest to neck deep water., and Water will allow for reduced gait deviation due to reduced joint loading through buoyancy to help patient improve posture without excess stress and pain  Walking with alt UE motion, single float dumbbell in each hand- fwd & back Side stepping + horiz abd/add, single float dumbbell in each hand Squats- dumbbells floating at 90 horiz abd, cues for glut activation Mini squat alt punch under water- double yellow dumbbells- fwd punch & across  Single leg noodle press down Wide tandem - static stance & upper body rotations holding water dumbbell on top of of  the water stepping to turn 1/4 turns, static stance with head turns- cues for core+gluts Fast walking with hands in water for drag                      PT Short Term Goals - 06/19/21 1945       PT SHORT TERM GOAL #1   Title pt will verbalize ability to utilize postures and muscle activations to support upright postures    Baseline will educate and establish    Time 2    Period Weeks    Status New    Target Date 07/05/21               PT Long Term Goals - 06/19/21 1944       PT LONG TERM GOAL #1   Title pt will be able to carry a laundry basket to her couch    Baseline requires help at eval    Time 6    Period Weeks    Status New     Target Date 08/02/21      PT LONG TERM GOAL #2   Title 5TSTS to 12s or less    Baseline 14s at eval    Time 6    Period Weeks    Status New    Target Date 08/02/21      PT LONG TERM GOAL #3   Title to improve by [facility]    Baseline see flowsheet    Time 6    Period Weeks    Status New    Target Date 08/02/21                   Plan - 06/28/21 1042     Clinical Impression Statement notable difficulty balancing with Lt LE behind her, kept a very wide stance in tandem for support. Improved ability to utilize core activation and keep tension out of lower back.Multiple cues required for stacked posture and challenged her with this as her HEP.    PT Treatment/Interventions ADLs/Self Care Home Management;Aquatic Therapy;Cryotherapy;Gait training;Moist Heat;Stair training;Functional mobility training;Therapeutic activities;Therapeutic exercise;Balance training;Neuromuscular re-education;Manual techniques;Patient/family education;Passive range of motion;Dry needling;Taping    PT Next Visit Plan cont balance challenges, cont with weight anteriorly placed with need for counter balance    PT Home Exercise Plan core engagement in activities    Consulted and Agree with Plan of Care Patient             Patient will benefit from skilled therapeutic intervention in order to improve the following deficits and impairments:  Abnormal gait, Difficulty walking, Impaired UE functional use, Decreased endurance, Decreased activity tolerance, Pain, Decreased balance, Decreased strength, Impaired sensation  Visit Diagnosis: Difficulty in walking, not elsewhere classified  Muscle weakness (generalized)     Problem List Patient Active Problem List   Diagnosis Date Noted   Insulin resistance 05/11/2020   DOE (dyspnea on exertion) 02/09/2020   Depression 09/26/2019   Vitamin D deficiency 09/01/2019   Class 3 severe obesity with serious comorbidity and body mass index (BMI) of 50.0 to  59.9 in adult [facility]) 09/01/2019   Prediabetes 08/15/2019   Atrial fibrillation (HCC)    Chest pain 04/22/2016   Hypokalemia 04/22/2016   Morbid obesity (HCC) 04/22/2016   Hypertension 04/22/2016   Paroxysmal A-fib (HCC) 04/22/2016   Hypomagnesemia 04/22/2016    Keia Rask C. Chance Karam PT, DPT 06/28/21 1:55 PM   [facility] [address] [place],  [place], [address] Phone: [phone]   Fax:  [phone]  Name: KAOIR LOREE MRN: [id number] Date of Birth: 1955/11/19

## 2021-07-02 ENCOUNTER — Other Ambulatory Visit: Payer: Self-pay

## 2021-07-02 ENCOUNTER — Encounter (HOSPITAL_BASED_OUTPATIENT_CLINIC_OR_DEPARTMENT_OTHER): Payer: Self-pay | Admitting: Physical Therapy

## 2021-07-02 ENCOUNTER — Ambulatory Visit (HOSPITAL_BASED_OUTPATIENT_CLINIC_OR_DEPARTMENT_OTHER): Payer: Medicare Other | Admitting: Physical Therapy

## 2021-07-02 DIAGNOSIS — R600 Localized edema: Secondary | ICD-10-CM | POA: Diagnosis not present

## 2021-07-02 DIAGNOSIS — M25572 Pain in left ankle and joints of left foot: Secondary | ICD-10-CM | POA: Diagnosis not present

## 2021-07-02 DIAGNOSIS — R262 Difficulty in walking, not elsewhere classified: Secondary | ICD-10-CM

## 2021-07-02 DIAGNOSIS — M25675 Stiffness of left foot, not elsewhere classified: Secondary | ICD-10-CM | POA: Diagnosis not present

## 2021-07-02 DIAGNOSIS — M6281 Muscle weakness (generalized): Secondary | ICD-10-CM

## 2021-07-02 NOTE — Therapy (Signed)
[facility] [address] [place], [place], [address] Phone: [phone]   Fax:  [phone]  Physical Therapy Treatment  Patient Details  Name: [name] MRN: [id number] Date of Birth: Feb 16, 1956 Referring Provider (PT): Berna Bue, MD   Encounter Date: 07/02/2021   PT End of Session - 07/02/21 0937     Visit Number 4    Number of Visits 13    Date for PT Re-Evaluation 08/02/21    Authorization Type UHC MCR    PT Start Time 0933    PT Stop Time 1011    PT Time Calculation (min) 38 min    Activity Tolerance Patient tolerated treatment well    Behavior During Therapy [facility] for tasks assessed/performed             Past Medical History:  Diagnosis Date   Atrial fibrillation (HCC)    Chronic lower back pain    Depression    DVT (deep venous thrombosis) (HCC) 1990s   LLE   Fallen arches    Hypertension    Hyperthyroidism ~ 2000   "fine now" (04/23/2016)   Joint pain    Lactose intolerance    Leg edema    Obesity    Pneumonia 1980s X 1   Snoring    has not had sleep study but suspects sleep apnea    Past Surgical History:  Procedure Laterality Date   CARDIOVERSION N/A 06/18/2016   Procedure: CARDIOVERSION;  Surgeon: Laurey Morale, MD;  Location: [facility] ENDOSCOPY;  Service: Cardiovascular;  Laterality: N/A;   CARDIOVERSION N/A 07/11/2016   Procedure: CARDIOVERSION;  Surgeon: Lewayne Bunting, MD;  Location: [facility] ENDOSCOPY;  Service: Cardiovascular;  Laterality: N/A;   CARDIOVERSION N/A 07/02/2017   Procedure: CARDIOVERSION;  Surgeon: Thurmon Fair, MD;  Location: [facility] ENDOSCOPY;  Service: Cardiovascular;  Laterality: N/A;   CARDIOVERSION N/A 09/29/2017   Procedure: CARDIOVERSION;  Surgeon: Laurey Morale, MD;  Location: [facility] ENDOSCOPY;  Service: Cardiovascular;  Laterality: N/A;   ENDOMETRIAL ABLATION  ~2006   TONSILLECTOMY  1960s   TUBAL LIGATION  1979    There were no vitals filed for this visit.    Subjective Assessment - 07/02/21 0937     Subjective Not feeling too bad. Not a lot of movement in the feet right now.    Patient Stated Goals have more endurance, incr strength, carry as I move/multifunction (need hlep carrying laundry basket)    Currently in Pain? No/denies                Pt entered pool via stairs using bil HR, dept up to 4', temp 94 deg. AquaticREHABdocumentation: Pt requires the buoyancy of water for active assisted exercises with buoyancy supported for strengthening & ROM exercises, Hydrostatic pressure also supports joints by unweighting joint load by at least 50 % in 3-4 feet depth water. 80% in chest to neck deep water., and Water will allow for reduced gait deviation due to reduced joint loading through buoyancy to help patient improve posture without excess stress and pain Fwd walking with alt UEs Side stepping with water dumbbells Retro stepping- cues for upright posture Ai Chi- 4-enclosing, 2- floating, 11- accepting with Delorise Shiner, keeping wide tandem stance- decr step length and added floating dumbbells to assist balance with hip hinge Hip hinge- water bar press down Elbows on side of pool- hip abd kicks Straight arm plank on bench- noodle under hips  PT Short Term Goals - 07/02/21 1306       PT SHORT TERM GOAL #1   Title pt will verbalize ability to utilize postures and muscle activations to support upright postures    Status Achieved               PT Long Term Goals - 06/19/21 1944       PT LONG TERM GOAL #1   Title pt will be able to carry a laundry basket to her couch    Baseline requires help at eval    Time 6    Period Weeks    Status New    Target Date 08/02/21      PT LONG TERM GOAL #2   Title 5TSTS to 12s or less    Baseline 14s at eval    Time 6    Period Weeks    Status New    Target Date 08/02/21      PT LONG TERM GOAL #3   Title to improve by MDC    Baseline see  flowsheet    Time 6    Period Weeks    Status New    Target Date 08/02/21                   Plan - 07/02/21 0950     Clinical Impression Statement utilized Ai Chi and tandem stances to challenge balance while UEs pressed water to challenge core/glut activation in upright posture. able to maintain balance today in static upright tandem but significant difficulty when hip hinge added and controlling weight movement.    Examination-Activity Limitations Bathing;Locomotion Level;Reach Overhead;Sit;Caring for Others;Carry;Stand;Lift    PT Treatment/Interventions ADLs/Self Care Home Management;Aquatic Therapy;Cryotherapy;Gait training;Moist Heat;Stair training;Functional mobility training;Therapeutic activities;Therapeutic exercise;Balance training;Neuromuscular re-education;Manual techniques;Patient/family education;Passive range of motion;Dry needling;Taping    PT Next Visit Plan cont balance challenges, cont with weight anteriorly placed with need for counter balance    PT Home Exercise Plan core engagement in activities, fwd hip hinge with glut set to upright posture    Consulted and Agree with Plan of Care Patient             Patient will benefit from skilled therapeutic intervention in order to improve the following deficits and impairments:  Abnormal gait, Difficulty walking, Impaired UE functional use, Decreased endurance, Decreased activity tolerance, Pain, Decreased balance, Decreased strength, Impaired sensation  Visit Diagnosis: Difficulty in walking, not elsewhere classified  Muscle weakness (generalized)     Problem List Patient Active Problem List   Diagnosis Date Noted   Insulin resistance 05/11/2020   DOE (dyspnea on exertion) 02/09/2020   Depression 09/26/2019   Vitamin D deficiency 09/01/2019   Class 3 severe obesity with serious comorbidity and body mass index (BMI) of 50.0 to 59.9 in adult [facility]) 09/01/2019   Prediabetes 08/15/2019   Atrial fibrillation  (HCC)    Chest pain 04/22/2016   Hypokalemia 04/22/2016   Morbid obesity (HCC) 04/22/2016   Hypertension 04/22/2016   Paroxysmal A-fib (HCC) 04/22/2016   Hypomagnesemia 04/22/2016   Briyanna Billingham C. Sicily Zaragoza PT, DPT 07/02/21 1:07 PM   [facility] [address] [place], [place], [address] Phone: [phone]   Fax:  [phone]  Name: [name] MRN: [id number] Date of Birth: [birth year]

## 2021-07-05 ENCOUNTER — Ambulatory Visit (HOSPITAL_BASED_OUTPATIENT_CLINIC_OR_DEPARTMENT_OTHER): Payer: Medicare Other | Admitting: Physical Therapy

## 2021-07-05 ENCOUNTER — Encounter (HOSPITAL_BASED_OUTPATIENT_CLINIC_OR_DEPARTMENT_OTHER): Payer: Self-pay | Admitting: Physical Therapy

## 2021-07-05 ENCOUNTER — Other Ambulatory Visit: Payer: Self-pay

## 2021-07-05 DIAGNOSIS — M25675 Stiffness of left foot, not elsewhere classified: Secondary | ICD-10-CM | POA: Diagnosis not present

## 2021-07-05 DIAGNOSIS — R600 Localized edema: Secondary | ICD-10-CM | POA: Diagnosis not present

## 2021-07-05 DIAGNOSIS — R262 Difficulty in walking, not elsewhere classified: Secondary | ICD-10-CM | POA: Diagnosis not present

## 2021-07-05 DIAGNOSIS — M6281 Muscle weakness (generalized): Secondary | ICD-10-CM | POA: Diagnosis not present

## 2021-07-05 DIAGNOSIS — M25572 Pain in left ankle and joints of left foot: Secondary | ICD-10-CM | POA: Diagnosis not present

## 2021-07-05 NOTE — Therapy (Signed)
[facility] [address] [place], [place], [address] Phone: [phone]   Fax:  [phone]  Physical Therapy Treatment  Patient Details  Name: [name] MRN: [id number] Date of Birth: 1955/11/12 Referring Provider (PT): Berna Bue, MD   Encounter Date: 07/05/2021   PT End of Session - 07/05/21 1016     Visit Number 5    Number of Visits 13    Date for PT Re-Evaluation 08/02/21    Authorization Type UHC MCR    PT Start Time 1013    PT Stop Time 1057    PT Time Calculation (min) 44 min    Activity Tolerance Patient tolerated treatment well    Behavior During Therapy [facility] for tasks assessed/performed             Past Medical History:  Diagnosis Date   Atrial fibrillation (HCC)    Chronic lower back pain    Depression    DVT (deep venous thrombosis) (HCC) 1990s   LLE   Fallen arches    Hypertension    Hyperthyroidism ~ 2000   "fine now" (04/23/2016)   Joint pain    Lactose intolerance    Leg edema    Obesity    Pneumonia 1980s X 1   Snoring    has not had sleep study but suspects sleep apnea    Past Surgical History:  Procedure Laterality Date   CARDIOVERSION N/A 06/18/2016   Procedure: CARDIOVERSION;  Surgeon: Laurey Morale, MD;  Location: [facility] ENDOSCOPY;  Service: Cardiovascular;  Laterality: N/A;   CARDIOVERSION N/A 07/11/2016   Procedure: CARDIOVERSION;  Surgeon: Lewayne Bunting, MD;  Location: [facility] ENDOSCOPY;  Service: Cardiovascular;  Laterality: N/A;   CARDIOVERSION N/A 07/02/2017   Procedure: CARDIOVERSION;  Surgeon: Thurmon Fair, MD;  Location: [facility] ENDOSCOPY;  Service: Cardiovascular;  Laterality: N/A;   CARDIOVERSION N/A 09/29/2017   Procedure: CARDIOVERSION;  Surgeon: Laurey Morale, MD;  Location: [facility] ENDOSCOPY;  Service: Cardiovascular;  Laterality: N/A;   ENDOMETRIAL ABLATION  ~2006   TONSILLECTOMY  1960s   TUBAL LIGATION  1979    There were no vitals filed for this visit.    Subjective Assessment - 07/05/21 1014     Subjective I used my arm to push up from a chair and felt my Lt shoulder pop, it is better today. Foot is doing okay. some soreness in core- working on breathing and holding everything in while I walk.                Pt entered pool using bil hand rail, depth up to 4' today, temp 93 deg.   AquaticREHABdocumentation: Pt is unable to tolerate land due to pain or inability to move freely on land without pain and substitution/compensations , Water will allow for reduced gait deviation due to reduced joint loading through buoyancy to help patient improve posture without excess stress and pain. , Hydrostatic pressure also supports joints by unweighting joint load by at least 50 % in 3-4 feet depth water. 80% in chest to neck deep water., and Water will allow for reduced gait deviation due to reduced joint loading through buoyancy to help patient improve posture without excess stress and pain   walking resisted by board- fwd, lat, backward floats on ankles: abd/add, single leg hinge-hip ext, high marching across pool straight arm press down with kick board Tandem stance+hip hinge- kick board across top of water Walking- carrying kick board out of water, cues to stack  posture and turn Rt foot fwd Hamstring & quad stretching                    PT Short Term Goals - 07/02/21 1306       PT SHORT TERM GOAL #1   Title pt will verbalize ability to utilize postures and muscle activations to support upright postures    Status Achieved               PT Long Term Goals - 06/19/21 1944       PT LONG TERM GOAL #1   Title pt will be able to carry a laundry basket to her couch    Baseline requires help at eval    Time 6    Period Weeks    Status New    Target Date 08/02/21      PT LONG TERM GOAL #2   Title 5TSTS to 12s or less    Baseline 14s at eval    Time 6    Period Weeks    Status New    Target Date 08/02/21      PT  LONG TERM GOAL #3   Title to improve by MDC    Baseline see flowsheet    Time 6    Period Weeks    Status New    Target Date 08/02/21                   Plan - 07/05/21 1039     Clinical Impression Statement continued to encourage stacked posture in all exercises as she tends to bend forward. improved control in dynamic balance with high stepping today but still very challenged in NBOS.    PT Treatment/Interventions ADLs/Self Care Home Management;Aquatic Therapy;Cryotherapy;Gait training;Moist Heat;Stair training;Functional mobility training;Therapeutic activities;Therapeutic exercise;Balance training;Neuromuscular re-education;Manual techniques;Patient/family education;Passive range of motion;Dry needling;Taping    PT Next Visit Plan cont balance challenges, cont with weight anteriorly placed with need for counter balance, add weights to wrists    PT Home Exercise Plan core engagement in activities, fwd hip hinge with glut set to upright posture    Consulted and Agree with Plan of Care Patient             Patient will benefit from skilled therapeutic intervention in order to improve the following deficits and impairments:  Abnormal gait, Difficulty walking, Impaired UE functional use, Decreased endurance, Decreased activity tolerance, Pain, Decreased balance, Decreased strength, Impaired sensation  Visit Diagnosis: Difficulty in walking, not elsewhere classified  Muscle weakness (generalized)     Problem List Patient Active Problem List   Diagnosis Date Noted   Insulin resistance 05/11/2020   DOE (dyspnea on exertion) 02/09/2020   Depression 09/26/2019   Vitamin D deficiency 09/01/2019   Class 3 severe obesity with serious comorbidity and body mass index (BMI) of 50.0 to 59.9 in adult [facility]) 09/01/2019   Prediabetes 08/15/2019   Atrial fibrillation (HCC)    Chest pain 04/22/2016   Hypokalemia 04/22/2016   Morbid obesity (HCC) 04/22/2016   Hypertension  04/22/2016   Paroxysmal A-fib (HCC) 04/22/2016   Hypomagnesemia 04/22/2016   Treanna Dumler C. Jeramia Saleeby PT, DPT 07/05/21 12:23 PM   [facility] [address] [place], [place], [address] Phone: [phone]   Fax:  [phone]  Name: KRYSTINE PABST MRN: [id number] Date of Birth: [birth year]

## 2021-07-09 ENCOUNTER — Ambulatory Visit (HOSPITAL_BASED_OUTPATIENT_CLINIC_OR_DEPARTMENT_OTHER): Payer: Medicare Other | Admitting: Physical Therapy

## 2021-07-09 ENCOUNTER — Encounter (HOSPITAL_BASED_OUTPATIENT_CLINIC_OR_DEPARTMENT_OTHER): Payer: Self-pay | Admitting: Physical Therapy

## 2021-07-09 ENCOUNTER — Other Ambulatory Visit: Payer: Self-pay

## 2021-07-09 DIAGNOSIS — M25675 Stiffness of left foot, not elsewhere classified: Secondary | ICD-10-CM | POA: Diagnosis not present

## 2021-07-09 DIAGNOSIS — M6281 Muscle weakness (generalized): Secondary | ICD-10-CM | POA: Diagnosis not present

## 2021-07-09 DIAGNOSIS — M25572 Pain in left ankle and joints of left foot: Secondary | ICD-10-CM | POA: Diagnosis not present

## 2021-07-09 DIAGNOSIS — R600 Localized edema: Secondary | ICD-10-CM

## 2021-07-09 DIAGNOSIS — R262 Difficulty in walking, not elsewhere classified: Secondary | ICD-10-CM | POA: Diagnosis not present

## 2021-07-09 NOTE — Therapy (Signed)
[facility] [address] [place], [place], [address] Phone: [phone]   Fax:  [phone]  Physical Therapy Treatment  Patient Details  Name: [name] MRN: [id number] Date of Birth: May 03, 1956 Referring Provider (PT): Berna Bue, MD   Encounter Date: 07/09/2021   PT End of Session - 07/09/21 1000     Visit Number 6    Number of Visits 13    Date for PT Re-Evaluation 08/02/21    Authorization Type UHC MCR    PT Start Time 0950    PT Stop Time 1030    PT Time Calculation (min) 40 min    Equipment Utilized During Treatment Other (comment)   Ankle cuff and waist buoys, kick board, noodle/sqoodle, nekdoodle, weights and barbells, water walker   Activity Tolerance Patient tolerated treatment well    Behavior During Therapy [facility] for tasks assessed/performed             Past Medical History:  Diagnosis Date   Atrial fibrillation (HCC)    Chronic lower back pain    Depression    DVT (deep venous thrombosis) (HCC) 1990s   LLE   Fallen arches    Hypertension    Hyperthyroidism ~ 2000   "fine now" (04/23/2016)   Joint pain    Lactose intolerance    Leg edema    Obesity    Pneumonia 1980s X 1   Snoring    has not had sleep study but suspects sleep apnea    Past Surgical History:  Procedure Laterality Date   CARDIOVERSION N/A 06/18/2016   Procedure: CARDIOVERSION;  Surgeon: Laurey Morale, MD;  Location: [facility] ENDOSCOPY;  Service: Cardiovascular;  Laterality: N/A;   CARDIOVERSION N/A 07/11/2016   Procedure: CARDIOVERSION;  Surgeon: Lewayne Bunting, MD;  Location: [facility] ENDOSCOPY;  Service: Cardiovascular;  Laterality: N/A;   CARDIOVERSION N/A 07/02/2017   Procedure: CARDIOVERSION;  Surgeon: Thurmon Fair, MD;  Location: [facility] ENDOSCOPY;  Service: Cardiovascular;  Laterality: N/A;   CARDIOVERSION N/A 09/29/2017   Procedure: CARDIOVERSION;  Surgeon: Laurey Morale, MD;  Location: [facility] ENDOSCOPY;  Service:  Cardiovascular;  Laterality: N/A;   ENDOMETRIAL ABLATION  ~2006   TONSILLECTOMY  1960s   TUBAL LIGATION  1979    There were no vitals filed for this visit.   Subjective Assessment - 07/09/21 1522     Subjective "I have been keeping everything tucked in as Shanda Bumps has instructed me and I think it has helped my LBP"    Currently in Pain? Yes    Pain Score 5     Pain Orientation Right    Pain Descriptors / Indicators Aching    Pain Type Chronic pain    Pain Onset More than a month ago    Pain Frequency Intermittent                                          PT Short Term Goals - 07/02/21 1306       PT SHORT TERM GOAL #1   Title pt will verbalize ability to utilize postures and muscle activations to support upright postures    Status Achieved               PT Long Term Goals - 06/19/21 1944       PT LONG TERM GOAL #1   Title pt will be able  to carry a laundry basket to her couch    Baseline requires help at eval    Time 6    Period Weeks    Status New    Target Date 08/02/21      PT LONG TERM GOAL #2   Title 5TSTS to 12s or less    Baseline 14s at eval    Time 6    Period Weeks    Status New    Target Date 08/02/21      PT LONG TERM GOAL #3   Title to improve by MDC    Baseline see flowsheet    Time 6    Period Weeks    Status New    Target Date 08/02/21            Pt seen for aquatic therapy today.  Treatment took place in water 3.25-4.8 ft in depth at the [facility] pool. Temp of water was 93.  Pt entered/exited the pool via stairs step to pattern independently with bilat rail.  Warm up: forward, backward and side stepping/walking cues for increased step length, increased speed, hand placement to increase resistance x 8 widths  Seated Stretching: gastroc, hamstrings, and glutes 3 reps of 10 second hold   Standing -Marching 2 x 10 Add/abd 2 x 10 VC for tightened core and posture alignment.  -kick  board push down 3 x 10. VC for core alignment and tightening. Manual perturbations (PT) Lateral rotation x 10, hip hinges x 10 Noodle kick down 2 x 10 reps R/L holding pool wall and 2 foam hand buoys  Balance: bilat UE support 2 foam hand buoys Modified Ai Chi 13/Balancing/ kicking forward x 10, kicking back x 10 then forward/back x 10  Pt water walking x 2-4 lengths between each group of challenges for recovery   Pt requires buoyancy for support and to offload joints with strengthening exercises. Viscosity of the water is needed for resistance of strengthening; water current perturbations provides challenge to standing balance unsupported, requiring increased core activation.      Plan - 07/09/21 1034     Clinical Impression Statement Pt verbalizing maintaining stacked position throughout session as she is cued.  Balance with Ai Chi 13 improved greatly with practice.  Used 2 foam hand buoys instead of noodle. Dynamic balance with noodle kickdown with difficulty.    PT Treatment/Interventions ADLs/Self Care Home Management;Aquatic Therapy;Cryotherapy;Gait training;Moist Heat;Stair training;Functional mobility training;Therapeutic activities;Therapeutic exercise;Balance training;Neuromuscular re-education;Manual techniques;Patient/family education;Passive range of motion;Dry needling;Taping    PT Next Visit Plan cont balance challenges, cont with weight anteriorly placed with need for counter balance, add weights to wrists    PT Home Exercise Plan core engagement in activities, fwd hip hinge with glut set to upright posture             Patient will benefit from skilled therapeutic intervention in order to improve the following deficits and impairments:  Abnormal gait, Difficulty walking, Impaired UE functional use, Decreased endurance, Decreased activity tolerance, Pain, Decreased balance, Decreased strength, Impaired sensation  Visit Diagnosis: Difficulty in walking, not elsewhere  classified  Muscle weakness (generalized)  Localized edema  Pain in left ankle and joints of left foot  Stiffness of left foot, not elsewhere classified     Problem List Patient Active Problem List   Diagnosis Date Noted   Insulin resistance 05/11/2020   DOE (dyspnea on exertion) 02/09/2020   Depression 09/26/2019   Vitamin D deficiency 09/01/2019   Class 3 severe obesity  with serious comorbidity and body mass index (BMI) of 50.0 to 59.9 in adult [facility]) 09/01/2019   Prediabetes 08/15/2019   Atrial fibrillation (HCC)    Chest pain 04/22/2016   Hypokalemia 04/22/2016   Morbid obesity (HCC) 04/22/2016   Hypertension 04/22/2016   Paroxysmal A-fib (HCC) 04/22/2016   Hypomagnesemia 04/22/2016    Rushie Chestnut) Meng Winterton MPT  07/09/2021, 3:25 PM  [facility] [address] [place], [place], [address] Phone: [phone]   Fax:  [phone]  Name: [name] MRN: [id number] Date of Birth: Sep 10, 1956

## 2021-07-11 ENCOUNTER — Telehealth (HOSPITAL_BASED_OUTPATIENT_CLINIC_OR_DEPARTMENT_OTHER): Payer: Self-pay | Admitting: Physical Therapy

## 2021-07-11 ENCOUNTER — Ambulatory Visit (HOSPITAL_BASED_OUTPATIENT_CLINIC_OR_DEPARTMENT_OTHER): Payer: Medicare Other | Admitting: Physical Therapy

## 2021-07-11 NOTE — Telephone Encounter (Signed)
Pt called. Forgot about PT appointment today. She is aware of next scheduled visit.

## 2021-07-15 ENCOUNTER — Ambulatory Visit (HOSPITAL_BASED_OUTPATIENT_CLINIC_OR_DEPARTMENT_OTHER): Payer: Medicare Other | Admitting: Physical Therapy

## 2021-07-15 ENCOUNTER — Other Ambulatory Visit: Payer: Self-pay

## 2021-07-15 ENCOUNTER — Encounter (HOSPITAL_BASED_OUTPATIENT_CLINIC_OR_DEPARTMENT_OTHER): Payer: Self-pay | Admitting: Physical Therapy

## 2021-07-15 DIAGNOSIS — M6281 Muscle weakness (generalized): Secondary | ICD-10-CM | POA: Diagnosis not present

## 2021-07-15 DIAGNOSIS — M25572 Pain in left ankle and joints of left foot: Secondary | ICD-10-CM | POA: Diagnosis not present

## 2021-07-15 DIAGNOSIS — M25675 Stiffness of left foot, not elsewhere classified: Secondary | ICD-10-CM | POA: Diagnosis not present

## 2021-07-15 DIAGNOSIS — R600 Localized edema: Secondary | ICD-10-CM

## 2021-07-15 DIAGNOSIS — R262 Difficulty in walking, not elsewhere classified: Secondary | ICD-10-CM | POA: Diagnosis not present

## 2021-07-15 NOTE — Therapy (Signed)
[facility] [address] [place], [place], [address] Phone: [phone]   Fax:  [phone]  Physical Therapy Treatment  Patient Details  Name: [name] MRN: [id number] Date of Birth: 01/04/1956 Referring Provider (PT): Berna Bue, MD   Encounter Date: 07/15/2021   PT End of Session - 07/15/21 1007     Visit Number 7    Number of Visits 13    Date for PT Re-Evaluation 08/02/21    Authorization Type UHC MCR    PT Start Time 0955    PT Stop Time 1035    PT Time Calculation (min) 40 min    Equipment Utilized During Treatment Other (comment)   Ankle cuff and waist buoys, kick board, noodle/sqoodle, nekdoodle, weights and barbells, water walker   Activity Tolerance Patient tolerated treatment well    Behavior During Therapy WFL for tasks assessed/performed             Past Medical History:  Diagnosis Date   Atrial fibrillation (HCC)    Chronic lower back pain    Depression    DVT (deep venous thrombosis) (HCC) 1990s   LLE   Fallen arches    Hypertension    Hyperthyroidism ~ 2000   "fine now" (04/23/2016)   Joint pain    Lactose intolerance    Leg edema    Obesity    Pneumonia 1980s X 1   Snoring    has not had sleep study but suspects sleep apnea    Past Surgical History:  Procedure Laterality Date   CARDIOVERSION N/A 06/18/2016   Procedure: CARDIOVERSION;  Surgeon: Laurey Morale, MD;  Location: [facility] ENDOSCOPY;  Service: Cardiovascular;  Laterality: N/A;   CARDIOVERSION N/A 07/11/2016   Procedure: CARDIOVERSION;  Surgeon: Lewayne Bunting, MD;  Location: [facility] ENDOSCOPY;  Service: Cardiovascular;  Laterality: N/A;   CARDIOVERSION N/A 07/02/2017   Procedure: CARDIOVERSION;  Surgeon: Thurmon Fair, MD;  Location: [facility] ENDOSCOPY;  Service: Cardiovascular;  Laterality: N/A;   CARDIOVERSION N/A 09/29/2017   Procedure: CARDIOVERSION;  Surgeon: Laurey Morale, MD;  Location: [facility] ENDOSCOPY;  Service:  Cardiovascular;  Laterality: N/A;   ENDOMETRIAL ABLATION  ~2006   TONSILLECTOMY  1960s   TUBAL LIGATION  1979    There were no vitals filed for this visit.   Subjective Assessment - 07/15/21 1021     Subjective "i'm definetly better but I get discouraged whren I get tired doing things that I feel I shouldn't get tired doin" g                                          PT Short Term Goals - 07/02/21 1306       PT SHORT TERM GOAL #1   Title pt will verbalize ability to utilize postures and muscle activations to support upright postures    Status Achieved               PT Long Term Goals - 06/19/21 1944       PT LONG TERM GOAL #1   Title pt will be able to carry a laundry basket to her couch    Baseline requires help at eval    Time 6    Period Weeks    Status New    Target Date 08/02/21      PT LONG TERM GOAL #2  Title 5TSTS to 12s or less    Baseline 14s at eval    Time 6    Period Weeks    Status New    Target Date 08/02/21      PT LONG TERM GOAL #3   Title to improve by MDC    Baseline see flowsheet    Time 6    Period Weeks    Status New    Target Date 08/02/21            Pt seen for aquatic therapy today.  Treatment took place in water 3.25-4.8 ft in depth at the [facility] pool. Temp of water was 93.  Pt entered/exited the pool via stairs step to pattern independently with bilat rail.   Warm up: forward, backward and side stepping/walking cues for increased step length, increased speed, hand placement to increase resistance x 8 widths   Seated Stretching: gastroc, hamstrings, and glutes 3 reps of 10 second hold    Standing -step ups on bottom step submerged 50% leading R/L x 15 -Marching 2 x 15 Add/abd 2 x 10 VC for tightened core and posture alignment.   -kick board push down 3 x 10. VC for core alignment and tightening. Manual perturbations (PT) Lateral rotation x 10 R/L Noodle kick down 2 x  10 reps R/L holding pool wall and 2 foam hand buoys   Balance: bilat UE supported by yellow noodle. Modified Ai Chi 13/Balancing/ kicking forward x 10, kicking back x 10 then forward/back x 5 pausing after each rotation. Then x 5 R/L without pause,   Pt water walking x 2-4 lengths between each group of challenges for recovery     Pt requires buoyancy for support and to offload joints with strengthening exercises. Viscosity of the water is needed for resistance of strengthening; water current perturbations provides challenge to standing balance unsupported, requiring increased core activation.         Plan - 07/15/21 1035     Clinical Impression Statement Pt presents today with reports of doing pretty well. Advanced aquatic exercises and focused on core strength as well as balance. She Progressed with Ai Chi 13 completing 5 revolutions L and 4 right without LOB.  VC for encourgament as pt reporting some frustration with slow progress.    PT Treatment/Interventions ADLs/Self Care Home Management;Aquatic Therapy;Cryotherapy;Gait training;Moist Heat;Stair training;Functional mobility training;Therapeutic activities;Therapeutic exercise;Balance training;Neuromuscular re-education;Manual techniques;Patient/family education;Passive range of motion;Dry needling;Taping    PT Next Visit Plan aerobic capacity challenges.    PT Home Exercise Plan core engagement in activities, fwd hip hinge with glut set to upright posture             Patient will benefit from skilled therapeutic intervention in order to improve the following deficits and impairments:  Abnormal gait, Difficulty walking, Impaired UE functional use, Decreased endurance, Decreased activity tolerance, Pain, Decreased balance, Decreased strength, Impaired sensation  Visit Diagnosis: Difficulty in walking, not elsewhere classified  Stiffness of left foot, not elsewhere classified  Muscle weakness (generalized)  Localized  edema  Pain in left ankle and joints of left foot     Problem List Patient Active Problem List   Diagnosis Date Noted   Insulin resistance 05/11/2020   DOE (dyspnea on exertion) 02/09/2020   Depression 09/26/2019   Vitamin D deficiency 09/01/2019   Class 3 severe obesity with serious comorbidity and body mass index (BMI) of 50.0 to 59.9 in adult [facility]) 09/01/2019   Prediabetes 08/15/2019   Atrial fibrillation (  HCC)    Chest pain 04/22/2016   Hypokalemia 04/22/2016   Morbid obesity (HCC) 04/22/2016   Hypertension 04/22/2016   Paroxysmal A-fib (HCC) 04/22/2016   Hypomagnesemia 04/22/2016    Rushie Chestnut) Raunak Antuna MPT  07/15/2021, 1:51 PM  [facility] [address] [place], [place], [address] Phone: [phone]   Fax:  [phone]  Name: [name] MRN: [id number] Date of Birth: [birth year]

## 2021-07-18 ENCOUNTER — Encounter (HOSPITAL_BASED_OUTPATIENT_CLINIC_OR_DEPARTMENT_OTHER): Payer: Self-pay | Admitting: Physical Therapy

## 2021-07-18 ENCOUNTER — Other Ambulatory Visit: Payer: Self-pay

## 2021-07-18 ENCOUNTER — Ambulatory Visit (HOSPITAL_BASED_OUTPATIENT_CLINIC_OR_DEPARTMENT_OTHER): Payer: Medicare Other | Admitting: Physical Therapy

## 2021-07-18 DIAGNOSIS — M25675 Stiffness of left foot, not elsewhere classified: Secondary | ICD-10-CM | POA: Diagnosis not present

## 2021-07-18 DIAGNOSIS — M6281 Muscle weakness (generalized): Secondary | ICD-10-CM | POA: Diagnosis not present

## 2021-07-18 DIAGNOSIS — R262 Difficulty in walking, not elsewhere classified: Secondary | ICD-10-CM

## 2021-07-18 DIAGNOSIS — M25572 Pain in left ankle and joints of left foot: Secondary | ICD-10-CM

## 2021-07-18 DIAGNOSIS — R600 Localized edema: Secondary | ICD-10-CM

## 2021-07-18 NOTE — Therapy (Signed)
[facility] [address] [place], [place], [address] Phone: [phone]   Fax:  [phone]  Physical Therapy Treatment  Patient Details  Name: [name] MRN: [id number] Date of Birth: 26-Apr-1956 Referring Provider (PT): Berna Bue, MD   Encounter Date: 07/18/2021   PT End of Session - 07/18/21 1037     Visit Number 8    Number of Visits 13    Date for PT Re-Evaluation 08/02/21    Authorization Type UHC MCR    PT Start Time 1031    PT Stop Time 1115    PT Time Calculation (min) 44 min    Equipment Utilized During Treatment Other (comment)   Ankle cuff and waist buoys, kick board, noodle/sqoodle, nekdoodle, weights and barbells, water walker   Activity Tolerance Patient tolerated treatment well    Behavior During Therapy WFL for tasks assessed/performed             Past Medical History:  Diagnosis Date   Atrial fibrillation (HCC)    Chronic lower back pain    Depression    DVT (deep venous thrombosis) (HCC) 1990s   LLE   Fallen arches    Hypertension    Hyperthyroidism ~ 2000   "fine now" (04/23/2016)   Joint pain    Lactose intolerance    Leg edema    Obesity    Pneumonia 1980s X 1   Snoring    has not had sleep study but suspects sleep apnea    Past Surgical History:  Procedure Laterality Date   CARDIOVERSION N/A 06/18/2016   Procedure: CARDIOVERSION;  Surgeon: Laurey Morale, MD;  Location: [facility] ENDOSCOPY;  Service: Cardiovascular;  Laterality: N/A;   CARDIOVERSION N/A 07/11/2016   Procedure: CARDIOVERSION;  Surgeon: Lewayne Bunting, MD;  Location: [facility] ENDOSCOPY;  Service: Cardiovascular;  Laterality: N/A;   CARDIOVERSION N/A 07/02/2017   Procedure: CARDIOVERSION;  Surgeon: Thurmon Fair, MD;  Location: [facility] ENDOSCOPY;  Service: Cardiovascular;  Laterality: N/A;   CARDIOVERSION N/A 09/29/2017   Procedure: CARDIOVERSION;  Surgeon: Laurey Morale, MD;  Location: [facility] ENDOSCOPY;  Service:  Cardiovascular;  Laterality: N/A;   ENDOMETRIAL ABLATION  ~2006   TONSILLECTOMY  1960s   TUBAL LIGATION  1979    There were no vitals filed for this visit.   Subjective Assessment - 07/18/21 1344     Subjective No complants or concerns    Currently in Pain? Yes    Pain Score 5     Pain Orientation Right    Pain Descriptors / Indicators Aching    Pain Type Chronic pain    Pain Onset More than a month ago    Pain Frequency Intermittent                                          PT Short Term Goals - 07/02/21 1306       PT SHORT TERM GOAL #1   Title pt will verbalize ability to utilize postures and muscle activations to support upright postures    Status Achieved               PT Long Term Goals - 06/19/21 1944       PT LONG TERM GOAL #1   Title pt will be able to carry a laundry basket to her couch    Baseline requires help at eval  Time 6    Period Weeks    Status New    Target Date 08/02/21      PT LONG TERM GOAL #2   Title 5TSTS to 12s or less    Baseline 14s at eval    Time 6    Period Weeks    Status New    Target Date 08/02/21      PT LONG TERM GOAL #3   Title to improve by MDC    Baseline see flowsheet    Time 6    Period Weeks    Status New    Target Date 08/02/21            Pt seen for aquatic therapy today.  Treatment took place in water 3.25-4.8 ft in depth at the [facility] pool. Temp of water was 93.  Pt entered/exited the pool via stairs step to pattern independently with bilat rail.   Warm up: forward, backward and side stepping/walking cues for increased step length, increased speed, hand placement to increase resistance x 8 widths   Seated Stretching: gastroc, hamstrings, and glutes 3 reps of 10 second hold    Standing Stretching: runners stretch hamstring and gastroc 3 x 30 second hold, add stretched 3 x 30 then LB stretch holding to handril in pike position  -step ups on bottom  step submerged 50% leading R/L x 15 rep bilateral then decreasing to unilateral ue support.  2nd trial x 10 reps without ue support for added core strengthening and balance training. -Marching 3x10  Add/abd 3x 10 VC for tightened core and posture alignment.  -kick board push down 2x15. VC for core alignment and tightening. Manual perturbations (PT) -Lateral rotation x 10 R/L pt challenged to increase intensity by submerging board 75% -Noodle kick down 2 x 15 reps R/L holding pool wall decreasing to no ue support.  -Balance and core strength challenges standing with wide BOS on noodle.  Gaining static balance with heels and then toes on ground x 30 sec (requiring many trails); Than progressed to rotating over noodle gaining balance heel and toe pt able to gain position and rotate at best x 3.   Water walking x 6 widths lunging , vc for increased speed.  O2 94% and max pulse 85.     Pt requires buoyancy for support and to offload joints with strengthening exercises. Viscosity of the water is needed for resistance of strengthening; water current perturbations provides challenge to standing balance unsupported, requiring increased core activation.        Plan - 07/18/21 1347     Clinical Impression Statement Focus today on balance and core strengthening progressed from last visit.  pt able to complete step uos without UE support once activity initiated and praciced.  Added standing and rotating over noodle with good activation of core muscles.  Pt shifting weight well with some LOB. Cuing for increase speed with all actiitys to gain aerobic capacity benefit. O2 sats remained 94% ~, although some increased respirations max pulse 80.   Great effort.    Stability/Clinical Decision Making Evolving/Moderate complexity    Rehab Potential Fair    PT Frequency 2x / week    PT Duration 6 weeks    PT Treatment/Interventions ADLs/Self Care Home Management;Aquatic Therapy;Cryotherapy;Gait training;Moist  Heat;Stair training;Functional mobility training;Therapeutic activities;Therapeutic exercise;Balance training;Neuromuscular re-education;Manual techniques;Patient/family education;Passive range of motion;Dry needling;Taping    PT Next Visit Plan aerobic capacity challenges.    PT Home Exercise Plan core engagement in  activities, fwd hip hinge with glut set to upright posture             Patient will benefit from skilled therapeutic intervention in order to improve the following deficits and impairments:  Abnormal gait, Difficulty walking, Impaired UE functional use, Decreased endurance, Decreased activity tolerance, Pain, Decreased balance, Decreased strength, Impaired sensation  Visit Diagnosis: Difficulty in walking, not elsewhere classified  Stiffness of left foot, not elsewhere classified  Muscle weakness (generalized)  Pain in left ankle and joints of left foot  Localized edema     Problem List Patient Active Problem List   Diagnosis Date Noted   Insulin resistance 05/11/2020   DOE (dyspnea on exertion) 02/09/2020   Depression 09/26/2019   Vitamin D deficiency 09/01/2019   Class 3 severe obesity with serious comorbidity and body mass index (BMI) of 50.0 to 59.9 in adult [facility]) 09/01/2019   Prediabetes 08/15/2019   Atrial fibrillation (HCC)    Chest pain 04/22/2016   Hypokalemia 04/22/2016   Morbid obesity (HCC) 04/22/2016   Hypertension 04/22/2016   Paroxysmal A-fib (HCC) 04/22/2016   Hypomagnesemia 04/22/2016    Rushie Chestnut) Gaylene Moylan MPT  07/18/2021, 1:50 PM  [facility] [address] [place], [place], [address] Phone: [phone]   Fax:  [phone]  Name: HINA GUPTA MRN: [id number] Date of Birth: 19-Feb-1956

## 2021-07-22 ENCOUNTER — Ambulatory Visit (HOSPITAL_BASED_OUTPATIENT_CLINIC_OR_DEPARTMENT_OTHER): Payer: Medicare Other | Admitting: Physical Therapy

## 2021-07-22 ENCOUNTER — Other Ambulatory Visit: Payer: Self-pay

## 2021-07-22 ENCOUNTER — Encounter (HOSPITAL_BASED_OUTPATIENT_CLINIC_OR_DEPARTMENT_OTHER): Payer: Self-pay | Admitting: Physical Therapy

## 2021-07-22 DIAGNOSIS — M25572 Pain in left ankle and joints of left foot: Secondary | ICD-10-CM | POA: Diagnosis not present

## 2021-07-22 DIAGNOSIS — M25675 Stiffness of left foot, not elsewhere classified: Secondary | ICD-10-CM | POA: Diagnosis not present

## 2021-07-22 DIAGNOSIS — R262 Difficulty in walking, not elsewhere classified: Secondary | ICD-10-CM | POA: Diagnosis not present

## 2021-07-22 DIAGNOSIS — M6281 Muscle weakness (generalized): Secondary | ICD-10-CM

## 2021-07-22 DIAGNOSIS — R600 Localized edema: Secondary | ICD-10-CM

## 2021-07-22 NOTE — Therapy (Signed)
[facility] [address] [place], [place], [address] Phone: [phone]   Fax:  [phone]  Physical Therapy Treatment  Patient Details  Name: [name] MRN: [id number] Date of Birth: 07/19/1956 Referring Provider (PT): Berna Bue, MD   Encounter Date: 07/22/2021   PT End of Session - 07/22/21 0953     Visit Number 9    Number of Visits 13    Date for PT Re-Evaluation 08/02/21    Authorization Type UHC MCR    PT Start Time 0945    PT Stop Time 1028    PT Time Calculation (min) 43 min    Equipment Utilized During Treatment Other (comment)    Activity Tolerance Patient tolerated treatment well    Behavior During Therapy [facility] for tasks assessed/performed             Past Medical History:  Diagnosis Date   Atrial fibrillation (HCC)    Chronic lower back pain    Depression    DVT (deep venous thrombosis) (HCC) 1990s   LLE   Fallen arches    Hypertension    Hyperthyroidism ~ 2000   "fine now" (04/23/2016)   Joint pain    Lactose intolerance    Leg edema    Obesity    Pneumonia 1980s X 1   Snoring    has not had sleep study but suspects sleep apnea    Past Surgical History:  Procedure Laterality Date   CARDIOVERSION N/A 06/18/2016   Procedure: CARDIOVERSION;  Surgeon: Laurey Morale, MD;  Location: [facility] ENDOSCOPY;  Service: Cardiovascular;  Laterality: N/A;   CARDIOVERSION N/A 07/11/2016   Procedure: CARDIOVERSION;  Surgeon: Lewayne Bunting, MD;  Location: [facility] ENDOSCOPY;  Service: Cardiovascular;  Laterality: N/A;   CARDIOVERSION N/A 07/02/2017   Procedure: CARDIOVERSION;  Surgeon: Thurmon Fair, MD;  Location: [facility] ENDOSCOPY;  Service: Cardiovascular;  Laterality: N/A;   CARDIOVERSION N/A 09/29/2017   Procedure: CARDIOVERSION;  Surgeon: Laurey Morale, MD;  Location: [facility] ENDOSCOPY;  Service: Cardiovascular;  Laterality: N/A;   ENDOMETRIAL ABLATION  ~2006   TONSILLECTOMY  1960s   TUBAL LIGATION   1979    There were no vitals filed for this visit.   Subjective Assessment - 07/22/21 0953     Subjective Pt reports she has had an improvement in her endurance  as she was able to walk the entire distance form the parkinglot today straight back to the pool without a seated rest period. She was also able to cook for ~ 4 hours straight yesterday without needing to sit and rest.    How long can you stand comfortably? an hour or so like in cooking a big meal    Patient Stated Goals have more endurance, incr strength, carry as I move/multifunction (need hlep carrying laundry basket)    Pain Onset More than a month ago                                          PT Short Term Goals - 07/02/21 1306       PT SHORT TERM GOAL #1   Title pt will verbalize ability to utilize postures and muscle activations to support upright postures    Status Achieved               PT Long Term Goals - 06/19/21 1944  PT LONG TERM GOAL #1   Title pt will be able to carry a laundry basket to her couch    Baseline requires help at eval    Time 6    Period Weeks    Status New    Target Date 08/02/21      PT LONG TERM GOAL #2   Title 5TSTS to 12s or less    Baseline 14s at eval    Time 6    Period Weeks    Status New    Target Date 08/02/21      PT LONG TERM GOAL #3   Title to improve by MDC    Baseline see flowsheet    Time 6    Period Weeks    Status New    Target Date 08/02/21            Pt seen for aquatic therapy today.  Treatment took place in water 3.25-4.8 ft in depth at the [facility] pool. Temp of water was 93.  Pt entered/exited the pool via stairs step to pattern independently with bilat rail.   Warm up: forward, backward and side stepping/walking cues for increased step length, increased speed, hand placement to increase resistance x 8 widths       Standing Stretching: runners stretch hamstring and gastroc 3 x 30 second  hold, add stretched 3 x 30 then LB stretch holding to handril in pike position  Using ankle buoys -step ups on bottom step submerged 50% leading R/L x 10 rep unilateral ue support then x 10 without UE support 2nd trial x 10 reps without ue support for added core strengthening and balance training. -Marching 3x10  Add/abd 3x 10 VC for tightened core and posture alignment.  -2 foam hand buoy push down (core strengthening) 3 x 10 -Lateral rotation x 10 R/L board submerged 50% -Noodle kick down 3x 10 reps R/L holding pool wall decreasing to no ue support.  Water walking x 2-4 widths lunging , vc for increased speed between exercises.   Pt kept in constant motion other then 2-3 short recovery periods. O2 sats 95% Pulse max 85        Pt requires buoyancy for support and to offload joints with strengthening exercises. Viscosity of the water is needed for resistance of strengthening; water current perturbations provides challenge to standing balance unsupported, requiring increased core activation.          Plan - 07/22/21 1011     Clinical Impression Statement Advanced program today: Pt completes all exercises wiht ankle buoys, increased reps and sets of all exercises progressed to using 2 foam hand buoys for core strengthening push downs. She tolerates very well. O2 sats and pulse monitored thrugohout. Some difficulty increasing pt heart rate, may be secondary to a-fib.    Personal Factors and Comorbidities Comorbidity 3+    Examination-Activity Limitations Bathing;Locomotion Level;Reach Overhead;Sit;Caring for Others;Carry;Stand;Lift    Stability/Clinical Decision Making Evolving/Moderate complexity    Clinical Decision Making Moderate    Rehab Potential Fair    PT Frequency 2x / week    PT Duration 6 weeks    PT Treatment/Interventions ADLs/Self Care Home Management;Aquatic Therapy;Cryotherapy;Gait training;Moist Heat;Stair training;Functional mobility training;Therapeutic  activities;Therapeutic exercise;Balance training;Neuromuscular re-education;Manual techniques;Patient/family education;Passive range of motion;Dry needling;Taping    PT Next Visit Plan aerobic capacity challenges.    PT Home Exercise Plan core engagement in activities, fwd hip hinge with glut set to upright posture  Patient will benefit from skilled therapeutic intervention in order to improve the following deficits and impairments:  Abnormal gait, Difficulty walking, Impaired UE functional use, Decreased endurance, Decreased activity tolerance, Pain, Decreased balance, Decreased strength, Impaired sensation  Visit Diagnosis: Difficulty in walking, not elsewhere classified  Stiffness of left foot, not elsewhere classified  Muscle weakness (generalized)  Pain in left ankle and joints of left foot  Localized edema     Problem List Patient Active Problem List   Diagnosis Date Noted   Insulin resistance 05/11/2020   DOE (dyspnea on exertion) 02/09/2020   Depression 09/26/2019   Vitamin D deficiency 09/01/2019   Class 3 severe obesity with serious comorbidity and body mass index (BMI) of 50.0 to 59.9 in adult [facility]) 09/01/2019   Prediabetes 08/15/2019   Atrial fibrillation (HCC)    Chest pain 04/22/2016   Hypokalemia 04/22/2016   Morbid obesity (HCC) 04/22/2016   Hypertension 04/22/2016   Paroxysmal A-fib (HCC) 04/22/2016   Hypomagnesemia 04/22/2016    Rushie Chestnut) Adiah Guereca MPT  07/22/2021, 11:47 AM  [facility] [address] [place], [place], [address] Phone: [phone]   Fax:  [phone]  Name: [name] MRN: [id number] Date of Birth: December 06, 1955

## 2021-07-25 ENCOUNTER — Ambulatory Visit (HOSPITAL_BASED_OUTPATIENT_CLINIC_OR_DEPARTMENT_OTHER): Payer: Medicare Other | Admitting: Physical Therapy

## 2021-07-30 ENCOUNTER — Ambulatory Visit (HOSPITAL_BASED_OUTPATIENT_CLINIC_OR_DEPARTMENT_OTHER): Payer: Medicare Other | Attending: Surgery | Admitting: Physical Therapy

## 2021-07-30 ENCOUNTER — Other Ambulatory Visit: Payer: Self-pay

## 2021-07-30 DIAGNOSIS — R262 Difficulty in walking, not elsewhere classified: Secondary | ICD-10-CM | POA: Insufficient documentation

## 2021-07-30 DIAGNOSIS — M25572 Pain in left ankle and joints of left foot: Secondary | ICD-10-CM | POA: Insufficient documentation

## 2021-07-30 DIAGNOSIS — M25675 Stiffness of left foot, not elsewhere classified: Secondary | ICD-10-CM | POA: Diagnosis not present

## 2021-07-30 DIAGNOSIS — M6281 Muscle weakness (generalized): Secondary | ICD-10-CM | POA: Diagnosis not present

## 2021-07-30 NOTE — Therapy (Signed)
[facility] [address] [place], [place], [address] Phone: [phone]   Fax:  [phone]  Physical Therapy Treatment  Patient Details  Name: [name] MRN: [id number] Date of Birth: [birth year] Referring Provider (PT): Berna Bue, MD   Encounter Date: 07/30/2021   PT End of Session - 07/30/21 1031     Visit Number 10    Number of Visits 13    Date for PT Re-Evaluation 08/02/21    Authorization Type U[employer] MCR    PT Start Time 1031    PT Stop Time 1115    PT Time Calculation (min) 44 min    Equipment Utilized During Treatment Other (comment)    Activity Tolerance Patient tolerated treatment well    Behavior During Therapy [facility] for tasks assessed/performed             Past Medical History:  Diagnosis Date   Atrial fibrillation (HCC)    Chronic lower back pain    Depression    DVT (deep venous thrombosis) (HCC) 1990s   LLE   Fallen arches    Hypertension    Hyperthyroidism ~ 2000   "fine now" (04/23/2016)   Joint pain    Lactose intolerance    Leg edema    Obesity    Pneumonia 1980s X 1   Snoring    has not had sleep study but suspects sleep apnea    Past Surgical History:  Procedure Laterality Date   CARDIOVERSION N/A 06/18/2016   Procedure: CARDIOVERSION;  Surgeon: Laurey Morale, MD;  Location: [facility] ENDOSCOPY;  Service: Cardiovascular;  Laterality: N/A;   CARDIOVERSION N/A 07/11/2016   Procedure: CARDIOVERSION;  Surgeon: Lewayne Bunting, MD;  Location: [facility] ENDOSCOPY;  Service: Cardiovascular;  Laterality: N/A;   CARDIOVERSION N/A 07/02/2017   Procedure: CARDIOVERSION;  Surgeon: Thurmon Fair, MD;  Location: [facility] ENDOSCOPY;  Service: Cardiovascular;  Laterality: N/A;   CARDIOVERSION N/A 09/29/2017   Procedure: CARDIOVERSION;  Surgeon: Laurey Morale, MD;  Location: [facility] ENDOSCOPY;  Service: Cardiovascular;  Laterality: N/A;   ENDOMETRIAL ABLATION  ~2006   TONSILLECTOMY  1960s   TUBAL LIGATION   1979    There were no vitals filed for this visit.   Subjective Assessment - 07/30/21 1042     Subjective "I walk in my home 80% of time without my cane"    Currently in Pain? Yes    Pain Score 5     Pain Location Foot    Pain Orientation Right    Pain Descriptors / Indicators Aching    Pain Type Chronic pain    Pain Onset More than a month ago    Pain Frequency Intermittent                                          PT Short Term Goals - 07/02/21 1306       PT SHORT TERM GOAL #1   Title pt will verbalize ability to utilize postures and muscle activations to support upright postures    Status Achieved               PT Long Term Goals - 07/30/21 1113       PT LONG TERM GOAL #1   Baseline 07/30/21 pt reporting she now is able to carry laundry basket 15 ft indep to diningroom table where she folds the laundry.  Status Achieved            Pt seen for aquatic therapy today.  Treatment took place in water 3.25-4.8 ft in depth at the [facility] pool. Temp of water was 93.  Pt entered/exited the pool via stairs step to pattern independently with bilat rail.   Warm up: forward, backward and side stepping/walking cues for increased step length, increased speed, hand placement to increase resistance x 8 widths        Standing Stretching: runners stretch hamstring and gastroc 3 x 30 second hold, add stretched 3 x 30 then LB stretch holding to handril in pike position  Using ankle buoys -step ups on bottom step submerged 50% leading R/L 2x 10 rep unsupported vc for tightened core for strengthening and balance training. -Marching 3x10  Add/abd 2x 10 VC for tightened core and posture alignment. -Ai chi # 13;  2 sets of 5  -2 foam hand buoy push down (core strengthening) walking/lunges 4 widths forward. -Lateral rotation x 10 R/L board submerged 50%  Added: Using 2 foam hand buoys knees to chest 2 x 10                "           Vertical to supine holding x 10 seconds x 5.  VC, demonstration and TC needed for proper execution     Pt kept in constant motion other then 2-3 short recovery periods. O2 sats ~96% Pulse max 87    Pt requires buoyancy for support and to offload joints with strengthening exercises. Viscosity of the water is needed for resistance of strengthening; water current perturbations provides challenge to standing balance unsupported, requiring increased core activation.        Plan - 07/30/21 1043     Clinical Impression Statement Pt with one more scheduled aquatics session.  She has made many advances with strength, balance and amb as evidecned by decreased use of cane in  and out of home environment, amb > 500 ft each direction to and from pool without seated recovery periods, completion of Ai Chi # 13 without LOB.    PT Treatment/Interventions ADLs/Self Care Home Management;Aquatic Therapy;Cryotherapy;Gait training;Moist Heat;Stair training;Functional mobility training;Therapeutic activities;Therapeutic exercise;Balance training;Neuromuscular re-education;Manual techniques;Patient/family education;Passive range of motion;Dry needling;Taping    PT Next Visit Plan on land 5 MWT and sit to stand testing/DC             Patient will benefit from skilled therapeutic intervention in order to improve the following deficits and impairments:     Visit Diagnosis: Difficulty in walking, not elsewhere classified  Stiffness of left foot, not elsewhere classified  Muscle weakness (generalized)  Pain in left ankle and joints of left foot     Problem List Patient Active Problem List   Diagnosis Date Noted   Insulin resistance 05/11/2020   DOE (dyspnea on exertion) 02/09/2020   Depression 09/26/2019   Vitamin D deficiency 09/01/2019   Class 3 severe obesity with serious comorbidity and body mass index (BMI) of 50.0 to 59.9 in adult [facility]) 09/01/2019   Prediabetes 08/15/2019   Atrial  fibrillation (HCC)    Chest pain 04/22/2016   Hypokalemia 04/22/2016   Morbid obesity (HCC) 04/22/2016   Hypertension 04/22/2016   Paroxysmal A-fib (HCC) 04/22/2016   Hypomagnesemia 04/22/2016    Rushie Chestnut) Traeson Dusza MPT  07/30/2021, 1:45 PM  [facility] [address] [place], [place], [address] Phone: [phone]   Fax:  [phone]  Name: [name] MRN: [id number] Date of Birth: 25-Feb-1956

## 2021-07-31 ENCOUNTER — Encounter (HOSPITAL_COMMUNITY): Payer: Self-pay | Admitting: Surgery

## 2021-07-31 NOTE — Progress Notes (Signed)
Attempted to obtain medical history via telephone, unable to reach at this time. I left a voicemail to return pre surgical testing department's phone call.  

## 2021-08-01 ENCOUNTER — Ambulatory Visit (HOSPITAL_BASED_OUTPATIENT_CLINIC_OR_DEPARTMENT_OTHER): Payer: Medicare Other | Admitting: Physical Therapy

## 2021-08-01 ENCOUNTER — Other Ambulatory Visit: Payer: Self-pay

## 2021-08-01 ENCOUNTER — Encounter (HOSPITAL_BASED_OUTPATIENT_CLINIC_OR_DEPARTMENT_OTHER): Payer: Self-pay | Admitting: Physical Therapy

## 2021-08-01 DIAGNOSIS — R262 Difficulty in walking, not elsewhere classified: Secondary | ICD-10-CM

## 2021-08-01 DIAGNOSIS — M6281 Muscle weakness (generalized): Secondary | ICD-10-CM | POA: Diagnosis not present

## 2021-08-01 DIAGNOSIS — M25572 Pain in left ankle and joints of left foot: Secondary | ICD-10-CM

## 2021-08-01 DIAGNOSIS — M25675 Stiffness of left foot, not elsewhere classified: Secondary | ICD-10-CM

## 2021-08-01 NOTE — Therapy (Signed)
[facility] [address] [place], [place], [address] Phone: [phone]   Fax:  [phone]  Physical Therapy Treatment  Patient Details  Name: [name] MRN: [id number] Date of Birth: [birth year] Referring Provider (PT): Clovis Riley, MD   Encounter Date: 08/01/2021   PT End of Session - 08/01/21 1016     Visit Number 11    Number of Visits 13    Date for PT Re-Evaluation 08/02/21    PT Start Time 1694    PT Stop Time 1100    PT Time Calculation (min) 45 min    Equipment Utilized During Treatment Other (comment)    Activity Tolerance Patient tolerated treatment well    Behavior During Therapy [facility] for tasks assessed/performed             Past Medical History:  Diagnosis Date   Atrial fibrillation ([place])    Chronic lower back pain    Depression    DVT (deep venous thrombosis) ([place]) 1990s   LLE   Fallen arches    Hypertension    Hyperthyroidism ~ 2000   "fine now" (04/23/2016)   Joint pain    Lactose intolerance    Leg edema    Obesity    Pneumonia 1980s X 1   Snoring    has not had sleep study but suspects sleep apnea    Past Surgical History:  Procedure Laterality Date   CARDIOVERSION N/A 06/18/2016   Procedure: CARDIOVERSION;  Surgeon: Larey Dresser, MD;  Location: [facility];  Service: Cardiovascular;  Laterality: N/A;   CARDIOVERSION N/A 07/11/2016   Procedure: CARDIOVERSION;  Surgeon: Lelon Perla, MD;  Location: [facility];  Service: Cardiovascular;  Laterality: N/A;   CARDIOVERSION N/A 07/02/2017   Procedure: CARDIOVERSION;  Surgeon: Sanda Klein, MD;  Location: [facility] ENDOSCOPY;  Service: Cardiovascular;  Laterality: N/A;   CARDIOVERSION N/A 09/29/2017   Procedure: CARDIOVERSION;  Surgeon: Larey Dresser, MD;  Location: [facility];  Service: Cardiovascular;  Laterality: N/A;   ENDOMETRIAL ABLATION  ~2006   TONSILLECTOMY  1960s   TUBAL LIGATION  1979    There were no vitals  filed for this visit.   Subjective Assessment - 08/01/21 1350     Subjective "I forgot my cane yesterday when I went outside, no problems."                [facility] PT Assessment - 08/01/21 0001       6 minute walk test results    Aerobic Endurance Distance Walked 543    Endurance additional comments 2 seated and one standing rest periods      Standardized Balance Assessment   Five times sit to stand comments  12s                                      PT Short Term Goals - 07/02/21 1306       PT SHORT TERM GOAL #1   Title pt will verbalize ability to utilize postures and muscle activations to support upright postures    Status Achieved               PT Long Term Goals - 08/01/21 1031       PT LONG TERM GOAL #2   Baseline 12s 10-6/22    Status Achieved      PT LONG TERM GOAL #3  Baseline 543 at DC.  Pt did improve although inimally as per distance covered but she had 3 less resting periods demonstrating improved endurance.    Status On-going      PT LONG TERM GOAL #4   Baseline Pt reports wearing shoes with inserts from waking to going to bed    Status Achieved            Dc testing completed see flow sheets and goals  Pt seen for aquatic therapy today.  Treatment took place in water 3.25-4.8 ft in depth at the [facility] pool. Temp of water was 93.  Pt entered/exited the pool via stairs step to pattern independently with bilat rail.   Warm up: forward, backward and side stepping/walking cues for increased step length, increased speed, hand placement to increase resistance x 8 widths     Standing Noodle kick downs using blue noodle without UE support 3 x 15 hip in neutral than external rotator R/L -step ups on bottom step submerged 50% leading R/L 2x 10 rep unsupported vc for tightened core for strengthening and balance training. Kick board push down 2 x 15 manual perturbations (therapist) -Lateral rotation x 10  R/L   -2 foam hand buoy push down (core strengthening) walking/lunges 4 widths forward.     O2 sats ~98% Pulse max 109       Plan - 08/01/21 1351     Clinical Impression Statement Pt has met all but one goal although she has demonstrated improvements in that goal with increased endurance and decreased SOB. O2 sats and pulse monitored and remain wfl.  She has been assigned an HEP and is ready for dc.    Stability/Clinical Decision Making Evolving/Moderate complexity    Clinical Decision Making Moderate    Rehab Potential Fair    PT Frequency 2x / week    PT Duration 6 weeks    PT Treatment/Interventions ADLs/Self Care Home Management;Aquatic Therapy;Cryotherapy;Gait training;Moist Heat;Stair training;Functional mobility training;Therapeutic activities;Therapeutic exercise;Balance training;Neuromuscular re-education;Manual techniques;Patient/family education;Passive range of motion;Dry needling;Taping    PT Home Exercise Plan [phone]             Patient will benefit from skilled therapeutic intervention in order to improve the following deficits and impairments:  Abnormal gait, Difficulty walking, Impaired UE functional use, Decreased endurance, Decreased activity tolerance, Pain, Decreased balance, Decreased strength, Impaired sensation  Visit Diagnosis: Difficulty in walking, not elsewhere classified  Stiffness of left foot, not elsewhere classified  Muscle weakness (generalized)  Pain in left ankle and joints of left foot     Problem List Patient Active Problem List   Diagnosis Date Noted   Insulin resistance 05/11/2020   DOE (dyspnea on exertion) 02/09/2020   Depression 09/26/2019   Vitamin D deficiency 09/01/2019   Class 3 severe obesity with serious comorbidity and body mass index (BMI) of 50.0 to 59.9 in adult [facility]) 09/01/2019   Prediabetes 08/15/2019   Atrial fibrillation ([place])    Chest pain 04/22/2016   Hypokalemia 04/22/2016   Morbid obesity ([place])  04/22/2016   Hypertension 04/22/2016   Paroxysmal A-fib ([place]) 04/22/2016   Hypomagnesemia 04/22/2016  PHYSICAL THERAPY DISCHARGE SUMMARY  Visits from Start of Care: 06/19/2021  Current functional level related to goals / functional outcomes: Indep with all functional mobility and ADL's.  Indep with HEP   Remaining deficits: Pt with decreased endurance and limited community amb ability   Education / Equipment: Education on condition and Engineer, mining.  HEP  Patient agrees to discharge. Patient  goals were partially met. Patient is being discharged due to maximized rehab potential.    Stanton Kidney Tharon Aquas) Dylen Mcelhannon MPT 08/01/2021, 4:57 PM  [facility] [place], [place], [address] Phone: [phone]   Fax:  [phone]  Name: [name] MRN: [id number] Date of Birth: Oct 24, 1956

## 2021-08-09 ENCOUNTER — Encounter (HOSPITAL_COMMUNITY)
Admission: RE | Disposition: A | Discharge status: Home / Self Care | Payer: Self-pay | Source: Home / Self Care | Attending: Surgery

## 2021-08-09 ENCOUNTER — Ambulatory Visit (HOSPITAL_COMMUNITY): Payer: Medicare Other | Admitting: Anesthesiology

## 2021-08-09 ENCOUNTER — Encounter (HOSPITAL_COMMUNITY): Payer: Self-pay | Admitting: Surgery

## 2021-08-09 ENCOUNTER — Ambulatory Visit (HOSPITAL_COMMUNITY)
Admission: RE | Admit: 2021-08-09 | Discharge: 2021-08-09 | Disposition: A | Discharge status: Home / Self Care | Payer: Medicare Other | Attending: Surgery | Admitting: Surgery

## 2021-08-09 ENCOUNTER — Other Ambulatory Visit: Payer: Self-pay

## 2021-08-09 DIAGNOSIS — I10 Essential (primary) hypertension: Secondary | ICD-10-CM | POA: Diagnosis not present

## 2021-08-09 DIAGNOSIS — Z885 Allergy status to narcotic agent status: Secondary | ICD-10-CM | POA: Insufficient documentation

## 2021-08-09 DIAGNOSIS — K2100 Gastro-esophageal reflux disease with esophagitis, without bleeding: Secondary | ICD-10-CM | POA: Diagnosis not present

## 2021-08-09 DIAGNOSIS — Z86718 Personal history of other venous thrombosis and embolism: Secondary | ICD-10-CM | POA: Insufficient documentation

## 2021-08-09 DIAGNOSIS — K2970 Gastritis, unspecified, without bleeding: Secondary | ICD-10-CM | POA: Diagnosis not present

## 2021-08-09 DIAGNOSIS — Z6841 Body Mass Index (BMI) 40.0 and over, adult: Secondary | ICD-10-CM | POA: Insufficient documentation

## 2021-08-09 DIAGNOSIS — Z01818 Encounter for other preprocedural examination: Secondary | ICD-10-CM | POA: Insufficient documentation

## 2021-08-09 DIAGNOSIS — E559 Vitamin D deficiency, unspecified: Secondary | ICD-10-CM | POA: Diagnosis not present

## 2021-08-09 DIAGNOSIS — I4891 Unspecified atrial fibrillation: Secondary | ICD-10-CM | POA: Diagnosis not present

## 2021-08-09 DIAGNOSIS — Z87891 Personal history of nicotine dependence: Secondary | ICD-10-CM | POA: Insufficient documentation

## 2021-08-09 DIAGNOSIS — K209 Esophagitis, unspecified without bleeding: Secondary | ICD-10-CM | POA: Diagnosis not present

## 2021-08-09 DIAGNOSIS — K449 Diaphragmatic hernia without obstruction or gangrene: Secondary | ICD-10-CM | POA: Insufficient documentation

## 2021-08-09 DIAGNOSIS — Z7901 Long term (current) use of anticoagulants: Secondary | ICD-10-CM | POA: Diagnosis not present

## 2021-08-09 DIAGNOSIS — K219 Gastro-esophageal reflux disease without esophagitis: Secondary | ICD-10-CM | POA: Diagnosis not present

## 2021-08-09 DIAGNOSIS — F1290 Cannabis use, unspecified, uncomplicated: Secondary | ICD-10-CM | POA: Diagnosis not present

## 2021-08-09 DIAGNOSIS — E876 Hypokalemia: Secondary | ICD-10-CM | POA: Diagnosis not present

## 2021-08-09 DIAGNOSIS — Z79899 Other long term (current) drug therapy: Secondary | ICD-10-CM | POA: Diagnosis not present

## 2021-08-09 DIAGNOSIS — K2950 Unspecified chronic gastritis without bleeding: Secondary | ICD-10-CM | POA: Diagnosis not present

## 2021-08-09 HISTORY — PX: BIOPSY: SHX5522

## 2021-08-09 HISTORY — PX: ESOPHAGOGASTRODUODENOSCOPY: SHX5428

## 2021-08-09 SURGERY — EGD (ESOPHAGOGASTRODUODENOSCOPY)
Anesthesia: Monitor Anesthesia Care

## 2021-08-09 MED ORDER — PROPOFOL 500 MG/50ML IV EMUL
INTRAVENOUS | Status: DC | PRN
  Administered 2021-08-09: 125 ug/kg/min via INTRAVENOUS

## 2021-08-09 MED ORDER — PROPOFOL 500 MG/50ML IV EMUL
INTRAVENOUS | Status: AC
  Filled 2021-08-09: qty 50

## 2021-08-09 MED ORDER — SODIUM CHLORIDE 0.9 % IV SOLN
INTRAVENOUS | Status: DC
  Administered 2021-08-09: 500 mL via INTRAVENOUS

## 2021-08-09 MED ORDER — PROPOFOL 10 MG/ML IV BOLUS
INTRAVENOUS | Status: DC | PRN
  Administered 2021-08-09: 30 mg via INTRAVENOUS
  Administered 2021-08-09: 20 mg via INTRAVENOUS

## 2021-08-09 MED ORDER — LIDOCAINE 2% (20 MG/ML) 5 ML SYRINGE
INTRAMUSCULAR | Status: DC | PRN
  Administered 2021-08-09: 100 mg via INTRAVENOUS

## 2021-08-09 MED ORDER — SODIUM CHLORIDE 0.9 % IR SOLN: 1000.0000 mL | Freq: Once | Status: DC

## 2021-08-09 NOTE — Transfer of Care (Signed)
Immediate Anesthesia Transfer of Care Note  Patient: [name]  Procedure(s) Performed: ESOPHAGOGASTRODUODENOSCOPY (EGD) BIOPSY  Patient Location: PACU and Endoscopy Unit  Anesthesia Type:MAC  Level of Consciousness: awake, alert  and oriented  Airway & Oxygen Therapy: Patient Spontanous Breathing and Patient connected to face mask oxygen  Post-op Assessment: Report given to RN and Post -op Vital signs reviewed and stable  Post vital signs: Reviewed and stable and BP 140/90  Last Vitals:  Vitals Value Taken Time  BP    Temp    Pulse 79 08/09/21 1439  Resp 18 08/09/21 1439  SpO2 100 % 08/09/21 1439  Vitals shown include unvalidated device data.  Last Pain:  Vitals:   08/09/21 1300  TempSrc: Oral  PainSc: 0-No pain         Complications: No notable events documented.

## 2021-08-09 NOTE — Op Note (Signed)
[facility] Patient Name: [name] Procedure Date: 08/09/2021 MRN: [id number] Attending MD: Felicie Morn ,  Date of Birth: 10/01/1956 CSN: [id number] Age: 65 Admit Type: Outpatient Procedure:                Upper GI endoscopy Indications:              Diagnostic procedure, Abnormal UGI series Providers:                Felicie Morn, Dulcy Fanny, Frazier Richards, Technician, Kahi Mohala, Technician Referring MD:              Medicines:                Monitored Anesthesia Care Complications:            No immediate complications. Estimated Blood Loss:     Estimated blood loss was minimal. Procedure:                Pre-Anesthesia Assessment:                           - Prior to the procedure, a History and Physical                            was performed, and patient medications and                            allergies were reviewed. The patient is competent.                            The risks and benefits of the procedure and the                            sedation options and risks were discussed with the                            patient. All questions were answered and informed                            consent was obtained. Patient identification and                            proposed procedure were verified in the                            pre-procedure area. Mental Status Examination:                            alert and oriented. Airway Examination: normal                            oropharyngeal airway and neck mobility. Respiratory  Examination: clear to auscultation. CV Examination:                            normal. ASA Grade Assessment: II - A patient with                            mild systemic disease. After reviewing the risks                            and benefits, the patient was deemed in                            satisfactory condition to undergo the procedure.                             The anesthesia plan was to use monitored anesthesia                            care (MAC). Immediately prior to administration of                            medications, the patient was re-assessed for                            adequacy to receive sedatives. The heart rate,                            respiratory rate, oxygen saturations, blood                            pressure, adequacy of pulmonary ventilation, and                            response to care were monitored throughout the                            procedure. The physical status of the patient was                            re-assessed after the procedure.                           After obtaining informed consent, the endoscope was                            passed under direct vision. Throughout the                            procedure, the patient's blood pressure, pulse, and                            oxygen saturations were monitored continuously. The  [id number]-[id number] ([id number]) Olympus endoscope was introduced                            through the mouth, and advanced to the second part                            of duodenum. The upper GI endoscopy was                            accomplished without difficulty. The patient                            tolerated the procedure well. Scope In: Scope Out: Findings:      Non-severe esophagitis, likely related to stasis, with no bleeding was       found.      The lumen of the distal esophagus was moderately dilated.      A small sliding hiatal hernia was found.      Localized mild inflammation was found in the gastric antrum. Biopsies       were taken with a cold forceps for Helicobacter pylori testing.       Verification of patient identification for the specimen was done.      The in the duodenum was normal. Impression:               - Non-severe esophagitis with no bleeding.                           - Dilation in the distal  esophagus.                           - Small sliding hiatal hernia.                           - Gastritis. Biopsied.                           - Normal. Moderate Sedation:      Moderate (conscious) sedation was personally administered by an       anesthesia professional. The following parameters were monitored: oxygen       saturation, heart rate, blood pressure, and response to care. Total       physician intraservice time was 15 minutes. Recommendation:           - Await pathology results.                           - Return to Dr. Ron Parker office. Procedure Code(s):        --- Professional ---                           [phone], Esophagogastroduodenoscopy, flexible,                            transoral; with biopsy, single or multiple Diagnosis Code(s):        --- Professional ---  K20.90, Esophagitis, unspecified without bleeding                           K22.8, Other specified diseases of esophagus                           K44.9, Diaphragmatic hernia without obstruction or                            gangrene                           K29.70, Gastritis, unspecified, without bleeding                           R93.3, Abnormal findings on diagnostic imaging of                            other parts of digestive tract CPT copyright 2019 [employer]. All rights reserved. The codes documented in this report are preliminary and upon coder review may  be revised to meet current compliance requirements. [place],  08/09/2021 2:42:31 PM This report has been signed electronically. Number of Addenda: 0

## 2021-08-09 NOTE — H&P (Signed)
Admitting Physician: Hyman Hopes Kristen Fromm  Service: Bariatric Surgery  CC: Morbid Obesity  Subjective   HPI: [name] is an 65 y.o. female who is here for preoperative EGD prior to bariatric surgery.  Past Medical History:  Diagnosis Date   Atrial fibrillation (HCC)    Chronic lower back pain    Depression    DVT (deep venous thrombosis) (HCC) 1990s   LLE   [place]    Hypertension    Hyperthyroidism ~ 2000   "fine now" (04/23/2016)   Joint pain    Lactose intolerance    Leg edema    Obesity    Pneumonia 1980s X 1   Snoring    has not had sleep study but suspects sleep apnea    Past Surgical History:  Procedure Laterality Date   CARDIOVERSION N/A 06/18/2016   Procedure: CARDIOVERSION;  Surgeon: Laurey Morale, MD;  Location: [facility] ENDOSCOPY;  Service: Cardiovascular;  Laterality: N/A;   CARDIOVERSION N/A 07/11/2016   Procedure: CARDIOVERSION;  Surgeon: Lewayne Bunting, MD;  Location: [facility] ENDOSCOPY;  Service: Cardiovascular;  Laterality: N/A;   CARDIOVERSION N/A 07/02/2017   Procedure: CARDIOVERSION;  Surgeon: Thurmon Fair, MD;  Location: [facility] ENDOSCOPY;  Service: Cardiovascular;  Laterality: N/A;   CARDIOVERSION N/A 09/29/2017   Procedure: CARDIOVERSION;  Surgeon: Laurey Morale, MD;  Location: [facility] ENDOSCOPY;  Service: Cardiovascular;  Laterality: N/A;   ENDOMETRIAL ABLATION  ~2006   TONSILLECTOMY  1960s   TUBAL LIGATION  1979    Family History  Problem Relation Age of Onset   Diabetes Mellitus I Father    CVA Father    Arthritis Mother    Obesity Mother    Thyroid disease Mother    Liver disease Mother    CAD Maternal Grandfather     Social:  reports that she quit smoking about 33 years ago. Her smoking use included cigarettes. She has a 10.00 pack-year smoking history. She has never used smokeless tobacco. She reports current alcohol use of about 4.0 standard drinks per week. She reports current drug use. Drug: Marijuana.  Allergies:  Allergies   Allergen Reactions   Amlodipine Besylate     Other reaction(s): edema at 10 mg dose   Codeine Hives   Triamcinolone Nausea Only   Latex Itching and Rash    Medications: Current Outpatient Medications  Medication Instructions   acetaminophen (TYLENOL) 650 mg, Oral, Every 4 hours PRN   amphetamine-dextroamphetamine (ADDERALL XR) 15 MG 24 hr capsule 15 mg, Oral, 2 times daily   buPROPion (WELLBUTRIN SR) 200 mg, Oral, Daily   buPROPion (WELLBUTRIN XL) 150 mg, Oral, Daily   DULoxetine (CYMBALTA) 60 mg, Oral, Every morning, Takes with 30mg  dose for a total dose of 90mg  daily.   gabapentin (NEURONTIN) 400 mg, Oral, 3 times daily   metoprolol tartrate (LOPRESSOR) 25 MG tablet TAKE 1/2 TABLET(12.5 MG TOTAL) BY MOUTH TWICE DAILY   triamcinolone ointment (KENALOG) 0.1 % 1 application, Topical, Daily PRN   triamterene-hydrochlorothiazide (MAXZIDE-25) 37.5-25 MG tablet 1 tablet, Oral, Every morning   XARELTO 20 MG TABS tablet TAKE 1 TABLET(20 MG) BY MOUTH DAILY WITH SUPPER    ROS - all of the below systems have been reviewed with the patient and positives are indicated with bold text General: chills, fever or night sweats Eyes: blurry vision or double vision ENT: epistaxis or sore throat Allergy/Immunology: itchy/watery eyes or nasal congestion Hematologic/Lymphatic: bleeding problems, blood clots or swollen lymph nodes Endocrine: temperature intolerance or unexpected weight  changes Breast: new or changing breast lumps or nipple discharge Resp: cough, shortness of breath, or wheezing CV: chest pain or dyspnea on exertion GI: as per HPI GU: dysuria, trouble voiding, or hematuria MSK: joint pain or joint stiffness Neuro: TIA or stroke symptoms Derm: pruritus and skin lesion changes Psych: anxiety and depression  Objective   PE There were no vitals taken for this visit. Constitutional: NAD; conversant; no deformities Eyes: Moist conjunctiva; no lid lag; anicteric; PERRL Neck: Trachea  midline; no thyromegaly Lungs: Normal respiratory effort; no tactile fremitus CV: RRR; no palpable thrills; no pitting edema GI: Abd Soft, non-tender; no palpable hepatosplenomegaly MSK: Normal range of motion of extremities; no clubbing/cyanosis Psychiatric: Appropriate affect; alert and oriented x3 Lymphatic: No palpable cervical or axillary lymphadenopathy  No results found for this or any previous visit (from the past 24 hour(s)).  Imaging:  UGI 04/30/21 1. Nonspecific esophageal motility disorder. 2. Very small sliding-type hiatal hernia but no GE reflux demonstrated. 3. Smooth narrowing at the GE junction likely lower esophageal mucosal ring. The 13 mm barium pill would not pass through this area. 4. Normal appearance of the stomach and duodenum.   Assessment and Plan   [name] is an 65 y.o. female with morbid obesity here for upper endoscopy prior to surgery.  A preoperative UGI demonstrated a small hiatal hernia and lower esophageal mucosal ring.  She presents today to investigate this futher prior to bariatric surgery.  I explained the procedure of upper endoscopy with possible biopsy as well as its risks, benefits and alternatives.  After a full discussion and all questions answered the patient granted consent to proceed.  Quentin Ore, MD  [facility] Use [email or web address] to contact on call provider

## 2021-08-09 NOTE — Anesthesia Preprocedure Evaluation (Signed)
Anesthesia Evaluation  Patient identified by MRN, date of birth, ID band Patient awake    Reviewed: Allergy & Precautions, NPO status , Patient's Chart, lab work & pertinent test results  Airway Mallampati: II  TM Distance: <3 FB Neck ROM: Full    Dental no notable dental hx.    Pulmonary sleep apnea , former smoker,    Pulmonary exam normal breath sounds clear to auscultation + decreased breath sounds      Cardiovascular hypertension, Pt. on medications + DOE  Normal cardiovascular exam+ dysrhythmias Atrial Fibrillation  Rhythm:Regular Rate:Normal     Neuro/Psych negative neurological ROS  negative psych ROS   GI/Hepatic negative GI ROS, Neg liver ROS,   Endo/Other  Hyperthyroidism Morbid obesity  Renal/GU negative Renal ROS  negative genitourinary   Musculoskeletal negative musculoskeletal ROS (+)   Abdominal (+) + obese,   Peds negative pediatric ROS (+)  Hematology negative hematology ROS (+)   Anesthesia Other Findings   Reproductive/Obstetrics negative OB ROS                             Anesthesia Physical Anesthesia Plan  ASA: 3  Anesthesia Plan: MAC   Post-op Pain Management:    Induction: Intravenous  PONV Risk Score and Plan: 2 and Propofol infusion and Treatment may vary due to age or medical condition  Airway Management Planned: Simple Face Mask  Additional Equipment:   Intra-op Plan:   Post-operative Plan:   Informed Consent: I have reviewed the patients History and Physical, chart, labs and discussed the procedure including the risks, benefits and alternatives for the proposed anesthesia with the patient or authorized representative who has indicated his/her understanding and acceptance.     Dental advisory given  Plan Discussed with: CRNA and Surgeon  Anesthesia Plan Comments:         Anesthesia Quick Evaluation

## 2021-08-09 NOTE — Anesthesia Procedure Notes (Signed)
Procedure Name: MAC Date/Time: 08/09/2021 2:21 PM Performed by: Lollie Sails, CRNA Pre-anesthesia Checklist: Patient identified, Emergency Drugs available, Suction available, Patient being monitored and Timeout performed Oxygen Delivery Method: Simple face mask Preoxygenation: POM Used. Placement Confirmation: positive ETCO2

## 2021-08-10 NOTE — Anesthesia Postprocedure Evaluation (Signed)
Anesthesia Post Note  Patient: [name]  Procedure(s) Performed: ESOPHAGOGASTRODUODENOSCOPY (EGD) BIOPSY     Patient location during evaluation: PACU Anesthesia Type: MAC Level of consciousness: awake and alert Pain management: pain level controlled Vital Signs Assessment: post-procedure vital signs reviewed and stable Respiratory status: spontaneous breathing, nonlabored ventilation, respiratory function stable and patient connected to nasal cannula oxygen Cardiovascular status: stable and blood pressure returned to baseline Postop Assessment: no apparent nausea or vomiting Anesthetic complications: no   No notable events documented.  Last Vitals:  Vitals:   08/09/21 1453 08/09/21 1455  BP: 117/71 105/65  Pulse:  70  Resp: 17 17  Temp:    SpO2:      Last Pain:  Vitals:   08/09/21 1440  TempSrc: Oral  PainSc:                  [name] S

## 2021-08-12 ENCOUNTER — Encounter (HOSPITAL_COMMUNITY): Payer: Self-pay | Admitting: Surgery

## 2021-08-12 DIAGNOSIS — I8001 Phlebitis and thrombophlebitis of superficial vessels of right lower extremity: Secondary | ICD-10-CM | POA: Diagnosis not present

## 2021-08-12 LAB — SURGICAL PATHOLOGY

## 2021-08-13 DIAGNOSIS — M79604 Pain in right leg: Secondary | ICD-10-CM | POA: Diagnosis not present

## 2021-08-23 DIAGNOSIS — M5136 Other intervertebral disc degeneration, lumbar region: Secondary | ICD-10-CM | POA: Diagnosis not present

## 2021-08-23 DIAGNOSIS — I10 Essential (primary) hypertension: Secondary | ICD-10-CM | POA: Diagnosis not present

## 2021-08-23 DIAGNOSIS — Z23 Encounter for immunization: Secondary | ICD-10-CM | POA: Diagnosis not present

## 2021-08-23 DIAGNOSIS — G4733 Obstructive sleep apnea (adult) (pediatric): Secondary | ICD-10-CM | POA: Diagnosis not present

## 2021-08-23 DIAGNOSIS — D6869 Other thrombophilia: Secondary | ICD-10-CM | POA: Diagnosis not present

## 2021-08-23 DIAGNOSIS — Z0000 Encounter for general adult medical examination without abnormal findings: Secondary | ICD-10-CM | POA: Diagnosis not present

## 2021-08-23 DIAGNOSIS — K219 Gastro-esophageal reflux disease without esophagitis: Secondary | ICD-10-CM | POA: Diagnosis not present

## 2021-08-23 DIAGNOSIS — I480 Paroxysmal atrial fibrillation: Secondary | ICD-10-CM | POA: Diagnosis not present

## 2021-08-23 DIAGNOSIS — R7303 Prediabetes: Secondary | ICD-10-CM | POA: Diagnosis not present

## 2021-09-09 DIAGNOSIS — R509 Fever, unspecified: Secondary | ICD-10-CM | POA: Diagnosis not present

## 2021-09-09 DIAGNOSIS — R058 Other specified cough: Secondary | ICD-10-CM | POA: Diagnosis not present

## 2021-09-09 DIAGNOSIS — R059 Cough, unspecified: Secondary | ICD-10-CM | POA: Diagnosis not present

## 2021-09-09 DIAGNOSIS — B349 Viral infection, unspecified: Secondary | ICD-10-CM | POA: Diagnosis not present

## 2021-09-09 DIAGNOSIS — Z03818 Encounter for observation for suspected exposure to other biological agents ruled out: Secondary | ICD-10-CM | POA: Diagnosis not present

## 2021-09-10 ENCOUNTER — Ambulatory Visit: Payer: Self-pay | Admitting: Surgery

## 2021-09-10 NOTE — H&P (View-Only) (Signed)
[name] [id number]   Referring Provider:  Self   Subjective   Chief Complaint: Morbid obesity   History of Present Illness: Presents for further follow-up of surgical management of morbid obesity.  Has not had any major changes in her health since our initial visit 5 months ago.  Has a few good questions to discuss today. -Since our visit she has undergone a sleep study confirming obstructive sleep apnea and was prescribed a trial of BiPAP therapy. -She has met with her cardiologist, note from July 18 states that acceptable risk for planned procedure and may hold Xarelto for 2 days prior. -She has completed evaluation by psychology and nutrition. -She has also enrolled in physical therapy for preop conditioning. -Chest x-ray/upper GI: Nonspecific esophageal motility disorder, very small sliding hiatal hernia without reflux demonstrated, smooth narrowing at GE junction likely lower esophageal mucosal ring at which point the barium pill did obstruct. Subsequent upper endoscopy demonstrated nonsevere esophagitis likely related to stasis with no bleeding, moderately dilated distal esophagus, small sliding hiatal hernia localized mild inflammation in the gastric antrum, biopsies negative for H. pylori   Initial visit 04/11/21:  65 year old woman with history of atrial fibrillation on xarelto, chronic low back pain, depression, DVT, hypertension, hyperthyroid, joint pain, lower extremity edema, osteomyelitis of her right foot/infected hardware/immobility requiring multiple surgeries over the last 2 years, dyspnea on exertion.  She has struggled with her weight for many years, and has been a patient at the [facility] with Dr. Leafy Ro and her team.  She has tried lifestyle modification, multiple diets, medications most recently Ozempic and had gotten down to a weight of 269 pounds with all this effort.  Unfortunately developed the issues with her foot as mentioned  above and had a two-year ordeal of multiple surgeries, sepsis, etc. and has regained much of the weight up to 344.  She endorses irregular eating patterns, not eating enough throughout the day and then when she does eat she is very hungry and instead ovary.  Admits to a fairly high carbohydrate E pattern.  She is not able to do much as far as exercise given her orthopedic issues but would like to.  Consider this thoroughly, and notes that 2 of her siblings have had sleeve gastrectomy with good weight loss success.  She has been encouraged by her cardiologist and other medical providers to consider surgical intervention, and is now ready to take the next step in order to improve her overall quality of life, longevity and decrease the severity of her other comorbidities.  Previous surgeries include tubal ligation, tonsillectomy, endometrial ablation and multiple cardioversions.  Family history notable for arthritis, obesity, thyroid and liver disease in her mother, diabetes and stroke in her father, and coronary artery disease in grandparents.  Does have a history of alcohol use up to 4 glasses of wine per week, but recently quit.  Does endorse recreational marijuana use when she was younger.  She is a former smoker, 10-pack-year history but quit in 1989.  She is married with 2 children, and 3 grandchildren who live with her (ages 60, 8 and 46).  She is self-employed as an TEFL teacher.  Labwork recently in Epic includes a CMP, lipid panel, CBC and an EKG all the beginning of May.  Only notable finding was a hemoglobin of 11.2, EKG showed A. fib with nonspecific ST and T-wave abnormalities.  Also had a chest x-ray on April 30 with atelectasis of the right lung  base and stable cardiomegaly noted.  CTA on April 16 notes 4.2 cm ascending aortic aneurysm which is recommended for annual imaging follow-up.  Mild dependent atelectasis changes in the lungs bilaterally, and aortic atherosclerosis.  These were  all collected while she was admitted to the hospital for chest pain.  Troponins are negative.  Last echo was April 2021 with EF 55-60%, Her cardiologist is Dr. Gasper Sells, EP is Dr. Rayann Heman.  Review of Systems: A complete review of systems was obtained from the patient.  I have reviewed this information and discussed as appropriate with the patient.  See HPI as well for other ROS.   Medical History: Past Medical History:  Diagnosis Date   Arthritis    Atrial fibrillation, chronic (CMS-HCC) 08/27/2018   asymptomatic   Depression    DVT (deep venous thrombosis) (CMS-HCC) 1990?   left leg with surgically removed   GERD (gastroesophageal reflux disease)    MANAGED WITH DIET   Hypertension    MRSA (methicillin resistant Staphylococcus aureus) 2014   arm with treatment.    Obesity (BMI 30-39.9), unspecified 06/22/2017   Sepsis (CMS-HCC)    after first foot surgery , 03/2018     Patient Active Problem List  Diagnosis   Pes planus   Posterior tibial tendon dysfunction (PTTD) of both lower extremities   Achilles tendon contracture, bilateral   Osteoarthritis of left midfoot   Staphylococcal arthritis of right shoulder (CMS-HCC)   Staphylococcal arthritis of left ankle (CMS-HCC)   Hypertension   Atrial fibrillation, chronic (CMS-HCC)   Hypomagnesemia   Hypokalemia   AKI (acute kidney injury) (CMS-HCC)   Primary osteoarthritis of right knee   Morbid obesity with BMI of 45.0-49.9, adult (CMS-HCC)   History of DVT (deep vein thrombosis)   Depression   Posterior tibial tendinitis of left leg   Aneurysm of artery of lower extremity (CMS-HCC)    Past Surgical History:  Procedure Laterality Date   APPLICATION LEG SPLINT Left 04/20/2018   Procedure: APPLICATION OF SHORT LEG SPLINT (CALF TO FOOT);  Surgeon: Linna Darner, MD;  Location: [place];  Service: Orthopedics;  Laterality: Left;   APPLICATION LEG SPLINT Left 08/25/2018   Procedure: APPLICATION OF SHORT LEG SPLINT (CALF  TO FOOT);  Surgeon: Linna Darner, MD;  Location: [place];  Service: Orthopedics;  Laterality: Left;   APPLICATION LEG SPLINT Left 03/25/2019   Procedure: APPLICATION OF SHORT LEG SPLINT (CALF TO FOOT);  Surgeon: Tressia Miners, MD;  Location: [place];  Service: Orthopedics;  Laterality: Left;   APPLICATION WOUND VAC Left 08/25/2018   Procedure: NEGATIVE PRESSURE WOUND THERAPY, INCLUDING TOPICAL APPLICATION(S), ASSESSMENT, INSTRUCTION(S) FOR ONGOING CARE, PER SESSION; TOTAL WOUND(S) SURFACE AREA LESS THAN OR EQUAL TO 50 SQUARE CENTIMETERS;  Surgeon: Linna Darner, MD;  Location: [place] [facility];  Service: Orthopedics;  Laterality: Left;   ASPIRATION BONE MARROW Left 04/20/2018   Procedure: DIAGNOSTIC BONE MARROW; ASPIRATION(S);  Surgeon: Linna Darner, MD;  Location: [place];  Service: Orthopedics;  Laterality: Left;   BIOPSY EXCAL BONE LEG Left 02/16/2019   Procedure: BIOPSY, BONE, OPEN; DEEP LEG;  Surgeon: Tressia Miners, MD;  Location: [place];  Service: Orthopedics;  Laterality: Left;   CARDIOVERSION EXTERNAL     several due to A. fib.   DEBRIDEMENT SKIN/SUBCUTANEOUS TISSUE & MUSCLE ANKLE/FOOT/TOE Left 02/16/2019   Procedure: DEBRIDEMENT, ANKLE/FOOT/TOE; MUSCLE AND/OR FASCIA (INCLUDES EPIDERMIS, DERMIS, AND SUBCUTANEOUS TISSUE, IF PERFORMED); FIRST 20 SQ CM OR LESS;  Surgeon: Tressia Miners, MD;  Location: [place];  Service: Orthopedics;  Laterality: Left;   DISSECTION FOR DEEP INFECTION FOOT Left 08/25/2018   Procedure: INCISION AND DRAINAGE BELOW FASCIA WITH TENDON SHEATH INVOLVEMENT, FOOT, MULTIPLE AREA;  Surgeon: Linna Darner, MD;  Location: [place];  Service: Orthopedics;  Laterality: Left;   ELECTRICAL STIMULATION BONE NONINVASIVE Left 04/20/2018   Procedure: ELECTRICAL STIMULATION TO AID BONE HEALING; NONINVASIVE (NONOPERATIVE);  Surgeon: Linna Darner, MD;  Location: [place];  Service: Orthopedics;  Laterality: Left;   ESSURE TUBAL LIGATION      FUSION FOOT TRIPLE Left 04/20/2018   Procedure: ARTHRODESIS; TRIPLE;  Surgeon: Linna Darner, MD;  Location: [place];  Service: Orthopedics;  Laterality: Left;   FUSION FOOT TRIPLE Left 03/25/2019   Procedure: ARTHRODESIS; TRIPLE;  Surgeon: Tressia Miners, MD;  Location: [place];  Service: Orthopedics;  Laterality: Left;   INCISION BONE CORTEX FOOT Left 08/25/2018   Procedure: INCISION, BONE CORTEX (EG, OSTEOMYELITIS OR BONE ABSCESS), FOOT;  Surgeon: Linna Darner, MD;  Location: [place];  Service: Orthopedics;  Laterality: Left;   INCISION BONE CORTEX LEG Left 08/25/2018   Procedure: INCISION (EG, OSTEOMYELITIS OR BONE ABSCESS),  ANKLE;  Surgeon: Linna Darner, MD;  Location: [place];  Service: Orthopedics;  Laterality: Left;   INJECTION ANESTHETIC AGENT OTHER NERVE OR BRANCH Left 04/20/2018   Procedure: INJECTION, ANESTHETIC AGENT; OTHER PERIPHERAL NERVE OR BRANCH;  Surgeon: Linna Darner, MD;  Location: [place];  Service: Orthopedics;  Laterality: Left;   INJECTION ANESTHETIC AGENT OTHER NERVE OR BRANCH Left 08/25/2018   Procedure: INJECTION, ANESTHETIC AGENT; OTHER PERIPHERAL NERVE OR BRANCH;  Surgeon: Linna Darner, MD;  Location: [place];  Service: Orthopedics;  Laterality: Left;   INTRAOPERATIVE FLUOROSCOPY Left 04/20/2018   Procedure: FLUOROSCOPY;  Surgeon: Linna Darner, MD;  Location: [place];  Service: Orthopedics;  Laterality: Left;   INTRAOPERATIVE FLUOROSCOPY Left 08/25/2018   Procedure: FLUOROSCOPY (SEPARATE PROCEDURE), UP TO 1 HOUR PHYSICIAN OR OTHER QUALIFIED HEALTH CARE PROFESSIONAL TIME;  Surgeon: Linna Darner, MD;  Location: [place];  Service: Orthopedics;  Laterality: Left;   INTRAOPERATIVE FLUOROSCOPY N/A 03/25/2019   Procedure: FLUOROSCOPY;  Surgeon: Tressia Miners, MD;  Location: [name];  Service: Orthopedics;  Laterality: N/A;   LIGATION MAJOR ARTERY LEG Left 09/06/2019   Procedure: LIGATION LEFT POSTERIOR TIBIAL  ARTERY ANEURYSM;  Surgeon: Wallene Huh, MD;  Location: [place];  Service: Cardiothoracic;  Laterality: Left;   OSTEOTOMY CALCANEUS Left 03/25/2019   Procedure: OSTEOTOMY; CALCANEUS (EG, DWYER OR CHAMBERS TYPE PROCEDURE), WITH OR WITHOUT INTERNAL FIXATION;  Surgeon: Tressia Miners, MD;  Location: [place];  Service: Orthopedics;  Laterality: Left;   OSTEOTOMY MIDTARSAL BONES W/GRAFT Left 03/25/2019   Procedure: OSTEOTOMY, TARSAL BONES, OTHER THAN CALCANEUS OR TALUS;  Surgeon: Tressia Miners, MD;  Location: [place];  Service: Orthopedics;  Laterality: Left;   REMOVAL HARDWARE ANKLE/FOOT/TOES Left 08/25/2018   Procedure: REMOVAL OF IMPLANT; ANKLE/FOOT DEEP (EG, BURIED WIRE, PIN, SCREW, METAL BAND, NAIL, ROD OR PLATE) x 3;  Surgeon: Linna Darner, MD;  Location: [place];  Service: Orthopedics;  Laterality: Left;   TENOTOMY PERCUTANEOUS ACHILLES TENDON UNDER GENERAL ANESTHESIA Left 04/20/2018   Procedure: TENOTOMY, PERCUTANEOUS, ACHILLES TENDON (SEPARATE PROCEDURE); GENERAL ANESTHESIA;  Surgeon: Linna Darner, MD;  Location: [place];  Service: Orthopedics;  Laterality: Left;   TENOTOMY PERCUTANEOUS ACHILLES TENDON UNDER GENERAL ANESTHESIA Left 03/25/2019   Procedure: TENOTOMY, PERCUTANEOUS, ACHILLES TENDON (SEPARATE PROCEDURE); GENERAL ANESTHESIA;  Surgeon:  Tressia Miners, MD;  Location: [place];  Service: Orthopedics;  Laterality: Left;   TRANSFER/TRANSPLANT SINGLE TENDON TO FOOT Left 04/20/2018   Procedure: TRANSFER OR TRANSPLANT OF SINGLE TENDON (WITH MUSCLE REDIRECTION OR REROUTING); DEEP;  Surgeon: Linna Darner, MD;  Location: [place];  Service: Orthopedics;  Laterality: Left;   uterine ablation       Allergies  Allergen Reactions   Codeine Hives, Itching, Nausea And Vomiting and Nausea   Amlodipine Besylate Other (See Comments)    Other reaction(s): edema at 10 mg dose   Triamcinolone Other (See Comments)    Other reaction(s): nausea   Latex Rash     Current Outpatient Medications on File Prior to Visit  Medication Sig Dispense Refill   acetaminophen (TYLENOL) 325 MG tablet Take 650 mg by mouth every 6 (six) hours as needed for Pain        albuterol 90 mcg/actuation inhaler Inhale 2 inhalations into the lungs every 4 (four) hours as needed        ALPRAZolam (XANAX) 0.25 MG tablet Take 0.25 mg by mouth once daily as needed    (Patient not taking: No sig reported)  0   ascorbic acid, vitamin C, (VITAMIN C) 1000 MG tablet Take 1,000 mg by mouth 2 (two) times daily     aspirin 81 MG EC tablet Take 81 mg by mouth 2 (two) times daily No cardiac stent. (Patient not taking: No sig reported)     buPROPion (WELLBUTRIN SR) 150 MG SR tablet      DULoxetine (CYMBALTA) 30 MG DR capsule Take 30 mg by mouth once daily        ergocalciferol, vitamin D2, 1,250 mcg (50,000 unit) capsule Take by mouth     gabapentin (NEURONTIN) 400 MG capsule TAKE 1 CAPSULE(400 MG) BY MOUTH THREE TIMES DAILY 90 capsule 11   loperamide (IMODIUM A-D) 2 mg tablet Take 2 mg by mouth 4 (four) times daily as needed for Diarrhea     metoprolol tartrate (LOPRESSOR) 25 MG tablet Take 12.5 mg by mouth 2 (two) times daily     triamcinolone 0.1 % cream APPLY TOPICALLY TO THE AFFECTED AREA TWICE DAILY AS NEEDED     triamterene-hydrochlorothiazide (MAXZIDE-25) 37.5-25 mg tablet Take 1 tablet by mouth once daily    30 tablet 11   XARELTO 20 mg tablet Take 20 mg by mouth daily with dinner Patient stated last dose will be on 09/02/2019 per surgeon.       No current facility-administered medications on file prior to visit.    Family History  Problem Relation Age of Onset   COPD Mother      Social History   Tobacco Use  Smoking Status Former   Packs/day: 1.00   Years: 11.00   Pack years: 11.00   Types: Cigarettes   Quit date: 1991   Years since quitting: 31.9  Smokeless Tobacco Never     Social History   Socioeconomic History   Marital status: Married  Tobacco Use    Smoking status: Former    Packs/day: 1.00    Years: 11.00    Pack years: 11.00    Types: Cigarettes    Quit date: 1991    Years since quitting: 31.9   Smokeless tobacco: Never  Vaping Use   Vaping Use: Never used  Substance and Sexual Activity   Alcohol use: Not Currently    Alcohol/week: 1.0 standard drink    Types: 1 Glasses of wine per week  Drug use: Never   Sexual activity: Defer    Objective:    Alert, well-appearing Unlabored respirations Obese abdomen, nontender Nonpitting bilateral lower leg edema with atrophy of the left calf   Assessment and Plan:   MORBID OBESITY (E66.01) Story: She remains a candidate for sleeve gastrectomy. We have previously discussed the surgery including technical aspects, the risks of bleeding, infection, pain, scarring, injury to intra-abdominal structures, staple line leak or abscess, chronic abdominal pain or nausea, new onset or worsened GERD, DVT/PE, pneumonia, heart attack, stroke, death, failure to reach weight loss goals and weight regain, hernia, etc. Discussed the typical peri-, and postoperative course. Discussed the importance of lifelong behavioral changes to combat the chronic and relapsing disease which is obesity. Questions were answered.  Plan to proceed as scheduled on 10/08/2021. ATRIAL FIBRILLATION (I48.91) Story: She is on Xarelto which will need to be held for 2 days before surgery.  She has been cleared by cardiology. HYPERTENSION (I10) OBSTRUCTIVE SLEEP APNEA (G47.33) Story: Has been prescribed BiPAP JOINT PAIN (M25.50)   Brayden Betters Raquel James, MD

## 2021-09-10 NOTE — H&P (Signed)
° ° °[name] °[id number]  ° °Referring Provider:  Self ° ° °Subjective  ° °Chief Complaint: Morbid obesity ° ° °History of Present Illness: °Presents for further follow-up of surgical management of morbid obesity.  Has not had any major changes in her health since our initial visit 5 months ago.  Has a few good questions to discuss today. °-Since our visit she has undergone a sleep study confirming obstructive sleep apnea and was prescribed a trial of BiPAP therapy. °-She has met with her cardiologist, note from July 18 states that acceptable risk for planned procedure and may hold Xarelto for 2 days prior. °-She has completed evaluation by psychology and nutrition. °-She has also enrolled in physical therapy for preop conditioning. °-Chest x-ray/upper GI: Nonspecific esophageal motility disorder, very small sliding hiatal hernia without reflux demonstrated, smooth narrowing at GE junction likely lower esophageal mucosal ring at which point the barium pill did obstruct. °Subsequent upper endoscopy demonstrated nonsevere esophagitis likely related to stasis with no bleeding, moderately dilated distal esophagus, small sliding hiatal hernia localized mild inflammation in the gastric antrum, biopsies negative for H. pylori ° ° °Initial visit 04/11/21:  65-year-old woman with history of atrial fibrillation on xarelto, chronic low back pain, depression, DVT, hypertension, hyperthyroid, joint pain, lower extremity edema, osteomyelitis of her right foot/infected hardware/immobility requiring multiple surgeries over the last 2 years, dyspnea on exertion.  She has struggled with her weight for many years, and has been a patient [facility] with Dr. Beasley and her team.  She has tried lifestyle modification, multiple diets, medications most recently Ozempic and had gotten down to a weight of 269 pounds with all this effort.  Unfortunately developed the issues with her foot as mentioned  above and had a two-year ordeal of multiple surgeries, sepsis, etc. and has regained much of the weight up to 344. ° °She endorses irregular eating patterns, not eating enough throughout the day and then when she does eat she is very hungry and instead ovary.  Admits to a fairly high carbohydrate E pattern.  She is not able to do much as far as exercise given her orthopedic issues but would like to. ° °Consider this thoroughly, and notes that 2 of her siblings have had sleeve gastrectomy with good weight loss success.  She has been encouraged by her cardiologist and other medical providers to consider surgical intervention, and is now ready to take the next step in order to improve her overall quality of life, longevity and decrease the severity of her other comorbidities. ° °Previous surgeries include tubal ligation, tonsillectomy, endometrial ablation and multiple cardioversions. ° °Family history notable for arthritis, obesity, thyroid and liver disease in her mother, diabetes and stroke in her father, and coronary artery disease in grandparents. ° °Does have a history of alcohol use up to 4 glasses of wine per week, but recently quit.  Does endorse recreational marijuana use when she was younger.  She is a former smoker, 10-pack-year history but quit in 1989.  She is married with 2 children, and 3 grandchildren who live with her (ages 18, 16 and 13).  She is self-employed as an office cleaner. ° °Labwork recently in Epic includes a CMP, lipid panel, CBC and an EKG all the beginning of May.  Only notable finding was a hemoglobin of 11.2, EKG showed A. fib with nonspecific ST and T-wave abnormalities.  Also had a chest x-ray on April 30 with atelectasis of the right lung   base and stable cardiomegaly noted.  CTA on April 16 notes 4.2 cm ascending aortic aneurysm which is recommended for annual imaging follow-up.  Mild dependent atelectasis changes in the lungs bilaterally, and aortic atherosclerosis.  These were  all collected while she was admitted to the hospital for chest pain.  Troponins are negative.  Last echo was April 2021 with EF 55-60%, Her cardiologist is Dr. Gasper Sells, EP is Dr. Rayann Heman.  Review of Systems: A complete review of systems was obtained from the patient.  I have reviewed this information and discussed as appropriate with the patient.  See HPI as well for other ROS.   Medical History: Past Medical History:  Diagnosis Date   Arthritis    Atrial fibrillation, chronic (CMS-HCC) 08/27/2018   asymptomatic   Depression    DVT (deep venous thrombosis) (CMS-HCC) 1990?   left leg with surgically removed   GERD (gastroesophageal reflux disease)    MANAGED WITH DIET   Hypertension    MRSA (methicillin resistant Staphylococcus aureus) 2014   arm with treatment.    Obesity (BMI 30-39.9), unspecified 06/22/2017   Sepsis (CMS-HCC)    after first foot surgery , 03/2018     Patient Active Problem List  Diagnosis   Pes planus   Posterior tibial tendon dysfunction (PTTD) of both lower extremities   Achilles tendon contracture, bilateral   Osteoarthritis of left midfoot   Staphylococcal arthritis of right shoulder (CMS-HCC)   Staphylococcal arthritis of left ankle (CMS-HCC)   Hypertension   Atrial fibrillation, chronic (CMS-HCC)   Hypomagnesemia   Hypokalemia   AKI (acute kidney injury) (CMS-HCC)   Primary osteoarthritis of right knee   Morbid obesity with BMI of 45.0-49.9, adult (CMS-HCC)   History of DVT (deep vein thrombosis)   Depression   Posterior tibial tendinitis of left leg   Aneurysm of artery of lower extremity (CMS-HCC)    Past Surgical History:  Procedure Laterality Date   APPLICATION LEG SPLINT Left 04/20/2018   Procedure: APPLICATION OF SHORT LEG SPLINT (CALF TO FOOT);  Surgeon: Linna Darner, MD;  Location: [place];  Service: Orthopedics;  Laterality: Left;   APPLICATION LEG SPLINT Left 08/25/2018   Procedure: APPLICATION OF SHORT LEG SPLINT (CALF  TO FOOT);  Surgeon: Linna Darner, MD;  Location: [place];  Service: Orthopedics;  Laterality: Left;   APPLICATION LEG SPLINT Left 03/25/2019   Procedure: APPLICATION OF SHORT LEG SPLINT (CALF TO FOOT);  Surgeon: Tressia Miners, MD;  Location: [place];  Service: Orthopedics;  Laterality: Left;   APPLICATION WOUND VAC Left 08/25/2018   Procedure: NEGATIVE PRESSURE WOUND THERAPY, INCLUDING TOPICAL APPLICATION(S), ASSESSMENT, INSTRUCTION(S) FOR ONGOING CARE, PER SESSION; TOTAL WOUND(S) SURFACE AREA LESS THAN OR EQUAL TO 50 SQUARE CENTIMETERS;  Surgeon: Linna Darner, MD;  Location: [facility];  Service: Orthopedics;  Laterality: Left;   ASPIRATION BONE MARROW Left 04/20/2018   Procedure: DIAGNOSTIC BONE MARROW; ASPIRATION(S);  Surgeon: Linna Darner, MD;  Location: [place];  Service: Orthopedics;  Laterality: Left;   BIOPSY EXCAL BONE LEG Left 02/16/2019   Procedure: BIOPSY, BONE, OPEN; DEEP LEG;  Surgeon: Tressia Miners, MD;  Location: [place];  Service: Orthopedics;  Laterality: Left;   CARDIOVERSION EXTERNAL     several due to A. fib.   DEBRIDEMENT SKIN/SUBCUTANEOUS TISSUE & MUSCLE ANKLE/FOOT/TOE Left 02/16/2019   Procedure: DEBRIDEMENT, ANKLE/FOOT/TOE; MUSCLE AND/OR FASCIA (INCLUDES EPIDERMIS, DERMIS, AND SUBCUTANEOUS TISSUE, IF PERFORMED); FIRST 20 SQ CM OR LESS;  Surgeon: Tressia Miners, MD;  Location: [place];  Service: Orthopedics;  Laterality: Left;   DISSECTION FOR DEEP INFECTION FOOT Left 08/25/2018   Procedure: INCISION AND DRAINAGE BELOW FASCIA WITH TENDON SHEATH INVOLVEMENT, FOOT, MULTIPLE AREA;  Surgeon: Linna Darner, MD;  Location: [place];  Service: Orthopedics;  Laterality: Left;   ELECTRICAL STIMULATION BONE NONINVASIVE Left 04/20/2018   Procedure: ELECTRICAL STIMULATION TO AID BONE HEALING; NONINVASIVE (NONOPERATIVE);  Surgeon: Linna Darner, MD;  Location: [place];  Service: Orthopedics;  Laterality: Left;   ESSURE TUBAL LIGATION      FUSION FOOT TRIPLE Left 04/20/2018   Procedure: ARTHRODESIS; TRIPLE;  Surgeon: Linna Darner, MD;  Location: [place];  Service: Orthopedics;  Laterality: Left;   FUSION FOOT TRIPLE Left 03/25/2019   Procedure: ARTHRODESIS; TRIPLE;  Surgeon: Tressia Miners, MD;  Location: [place];  Service: Orthopedics;  Laterality: Left;   INCISION BONE CORTEX FOOT Left 08/25/2018   Procedure: INCISION, BONE CORTEX (EG, OSTEOMYELITIS OR BONE ABSCESS), FOOT;  Surgeon: Linna Darner, MD;  Location: [place];  Service: Orthopedics;  Laterality: Left;   INCISION BONE CORTEX LEG Left 08/25/2018   Procedure: INCISION (EG, OSTEOMYELITIS OR BONE ABSCESS),  ANKLE;  Surgeon: Linna Darner, MD;  Location: [place];  Service: Orthopedics;  Laterality: Left;   INJECTION ANESTHETIC AGENT OTHER NERVE OR BRANCH Left 04/20/2018   Procedure: INJECTION, ANESTHETIC AGENT; OTHER PERIPHERAL NERVE OR BRANCH;  Surgeon: Linna Darner, MD;  Location: [place];  Service: Orthopedics;  Laterality: Left;   INJECTION ANESTHETIC AGENT OTHER NERVE OR BRANCH Left 08/25/2018   Procedure: INJECTION, ANESTHETIC AGENT; OTHER PERIPHERAL NERVE OR BRANCH;  Surgeon: Linna Darner, MD;  Location: [place];  Service: Orthopedics;  Laterality: Left;   INTRAOPERATIVE FLUOROSCOPY Left 04/20/2018   Procedure: FLUOROSCOPY;  Surgeon: Linna Darner, MD;  Location: [place];  Service: Orthopedics;  Laterality: Left;   INTRAOPERATIVE FLUOROSCOPY Left 08/25/2018   Procedure: FLUOROSCOPY (SEPARATE PROCEDURE), UP TO 1 HOUR PHYSICIAN OR OTHER QUALIFIED HEALTH CARE PROFESSIONAL TIME;  Surgeon: Linna Darner, MD;  Location: [place];  Service: Orthopedics;  Laterality: Left;   INTRAOPERATIVE FLUOROSCOPY N/A 03/25/2019   Procedure: FLUOROSCOPY;  Surgeon: Tressia Miners, MD;  Location: [place];  Service: Orthopedics;  Laterality: N/A;   LIGATION MAJOR ARTERY LEG Left 09/06/2019   Procedure: LIGATION LEFT POSTERIOR TIBIAL  ARTERY ANEURYSM;  Surgeon: Wallene Huh, MD;  Location: [place];  Service: Cardiothoracic;  Laterality: Left;   OSTEOTOMY CALCANEUS Left 03/25/2019   Procedure: OSTEOTOMY; CALCANEUS (EG, DWYER OR CHAMBERS TYPE PROCEDURE), WITH OR WITHOUT INTERNAL FIXATION;  Surgeon: Tressia Miners, MD;  Location: [place];  Service: Orthopedics;  Laterality: Left;   OSTEOTOMY MIDTARSAL BONES W/GRAFT Left 03/25/2019   Procedure: OSTEOTOMY, TARSAL BONES, OTHER THAN CALCANEUS OR TALUS;  Surgeon: Tressia Miners, MD;  Location: [place];  Service: Orthopedics;  Laterality: Left;   REMOVAL HARDWARE ANKLE/FOOT/TOES Left 08/25/2018   Procedure: REMOVAL OF IMPLANT; ANKLE/FOOT DEEP (EG, BURIED WIRE, PIN, SCREW, METAL BAND, NAIL, ROD OR PLATE) x 3;  Surgeon: Linna Darner, MD;  Location: [place];  Service: Orthopedics;  Laterality: Left;   TENOTOMY PERCUTANEOUS ACHILLES TENDON UNDER GENERAL ANESTHESIA Left 04/20/2018   Procedure: TENOTOMY, PERCUTANEOUS, ACHILLES TENDON (SEPARATE PROCEDURE); GENERAL ANESTHESIA;  Surgeon: Linna Darner, MD;  Location: [place];  Service: Orthopedics;  Laterality: Left;   TENOTOMY PERCUTANEOUS ACHILLES TENDON UNDER GENERAL ANESTHESIA Left 03/25/2019   Procedure: TENOTOMY, PERCUTANEOUS, ACHILLES TENDON (SEPARATE PROCEDURE); GENERAL ANESTHESIA;  Surgeon:  Adams, Samuel Bruce, MD;  Location: [facility];  Service: Orthopedics;  Laterality: Left;  ° TRANSFER/TRANSPLANT SINGLE TENDON TO FOOT Left 04/20/2018  ° Procedure: TRANSFER OR TRANSPLANT OF SINGLE TENDON (WITH MUSCLE REDIRECTION OR REROUTING); DEEP;  Surgeon: Parekh, Selene Gunvant, MD;  Location: [facility];  Service: Orthopedics;  Laterality: Left;  ° uterine ablation    °  ° °Allergies  °Allergen Reactions  ° Codeine Hives, Itching, Nausea And Vomiting and Nausea  ° Amlodipine Besylate Other (See Comments)  °  Other reaction(s): edema at 10 mg dose  ° Triamcinolone Other (See Comments)  °  Other reaction(s): nausea  ° Latex Rash   ° ° °Current Outpatient Medications on File Prior to Visit  °Medication Sig Dispense Refill  ° acetaminophen (TYLENOL) 325 MG tablet Take 650 mg by mouth every 6 (six) hours as needed for Pain       ° albuterol 90 mcg/actuation inhaler Inhale 2 inhalations into the lungs every 4 (four) hours as needed       ° ALPRAZolam (XANAX) 0.25 MG tablet Take 0.25 mg by mouth once daily as needed    (Patient not taking: No sig reported)  0  ° ascorbic acid, vitamin C, (VITAMIN C) 1000 MG tablet Take 1,000 mg by mouth 2 (two) times daily    ° aspirin 81 MG EC tablet Take 81 mg by mouth 2 (two) times daily No cardiac stent. (Patient not taking: No sig reported)    ° buPROPion (WELLBUTRIN SR) 150 MG SR tablet     ° DULoxetine (CYMBALTA) 30 MG DR capsule Take 30 mg by mouth once daily       ° ergocalciferol, vitamin D2, 1,250 mcg (50,000 unit) capsule Take by mouth    ° gabapentin (NEURONTIN) 400 MG capsule TAKE 1 CAPSULE(400 MG) BY MOUTH THREE TIMES DAILY 90 capsule 11  ° loperamide (IMODIUM A-D) 2 mg tablet Take 2 mg by mouth 4 (four) times daily as needed for Diarrhea    ° metoprolol tartrate (LOPRESSOR) 25 MG tablet Take 12.5 mg by mouth 2 (two) times daily    ° triamcinolone 0.1 % cream APPLY TOPICALLY TO THE AFFECTED AREA TWICE DAILY AS NEEDED    ° triamterene-hydrochlorothiazide (MAXZIDE-25) 37.5-25 mg tablet Take 1 tablet by mouth once daily    30 tablet 11  ° XARELTO 20 mg tablet Take 20 mg by mouth daily with dinner Patient stated last dose will be on 09/02/2019 per surgeon.      ° °No current facility-administered medications on file prior to visit.  ° ° °Family History  °Problem Relation Age of Onset  ° COPD Mother   °  ° °Social History  ° °Tobacco Use  °Smoking Status Former  ° Packs/day: 1.00  ° Years: 11.00  ° Pack years: 11.00  ° Types: Cigarettes  ° Quit date: 1991  ° Years since quitting: 31.9  °Smokeless Tobacco Never  °  ° °Social History  ° °Socioeconomic History  ° Marital status: Married  °Tobacco Use  °  Smoking status: Former  °  Packs/day: 1.00  °  Years: 11.00  °  Pack years: 11.00  °  Types: Cigarettes  °  Quit date: 1991  °  Years since quitting: 31.9  ° Smokeless tobacco: Never  °Vaping Use  ° Vaping Use: Never used  °Substance and Sexual Activity  ° Alcohol use: Not Currently  °  Alcohol/week: 1.0 standard drink  °  Types: 1 Glasses of wine per week  °   Drug use: Never   Sexual activity: Defer    Objective:    Alert, well-appearing Unlabored respirations Obese abdomen, nontender Nonpitting bilateral lower leg edema with atrophy of the left calf   Assessment and Plan:   MORBID OBESITY (E66.01) Story: She remains a candidate for sleeve gastrectomy. We have previously discussed the surgery including technical aspects, the risks of bleeding, infection, pain, scarring, injury to intra-abdominal structures, staple line leak or abscess, chronic abdominal pain or nausea, new onset or worsened GERD, DVT/PE, pneumonia, heart attack, stroke, death, failure to reach weight loss goals and weight regain, hernia, etc. Discussed the typical peri-, and postoperative course. Discussed the importance of lifelong behavioral changes to combat the chronic and relapsing disease which is obesity. Questions were answered.  Plan to proceed as scheduled on 10/08/2021. ATRIAL FIBRILLATION (I48.91) Story: She is on Xarelto which will need to be held for 2 days before surgery.  She has been cleared by cardiology. HYPERTENSION (I10) OBSTRUCTIVE SLEEP APNEA (G47.33) Story: Has been prescribed BiPAP JOINT PAIN (M25.50)   Evelio Rueda Raquel James, MD

## 2021-09-18 NOTE — Progress Notes (Addendum)
COVID swab appointment: 10-04-21  COVID Vaccine Completed:  Yes x2 Date COVID Vaccine completed: 01-23-20 02-15-20 Has received booster: COVID vaccine manufacturer: Pfizer      Date of COVID positive in last 90 days:  No  PCP - Laurann Montana, MD Cardiologist - Hillis Range, MD  Cardiac clearance in note in Epic dated 05-13-21 by Gillian Shields, NP  Chest x-ray - 04-30-21 Epic EKG - 05-13-21 Epic Stress Test - 02-13-20 Epic ECHO - 02-23-20 Epic Cardiac Cath - N/A Pacemaker/ICD device last checked: Spinal Cord Stimulator:  Sleep Study - Yes, +sleep apnea and hypoxia CPAP - One has been ordered, on backorder  Fasting Blood Sugar -  N/A Checks Blood Sugar _____ times a day  Blood Thinner Instructions: Xarelto.  Hold 2 days preop.  Patient aware Aspirin Instructions: Last Dose:  Activity level:   Patient states that she does not have to do stairs and has not climbed a flight in while.  Able to  perform activities of daily living without stopping and without symptoms of chest pain or shortness of breath.  Patient states that she can get short of breath with exertion at times, but thinks it is due to being overweight.      Anesthesia review:  Afib, HTN, OSA and hypoxia by sleep study  Patient denies shortness of breath, fever, cough and chest pain at PAT appointment   Patient verbalized understanding of instructions that were given to them at the PAT appointment. Patient was also instructed that they will need to review over the PAT instructions again at home before surgery.

## 2021-09-18 NOTE — Patient Instructions (Addendum)
DUE TO COVID-19 ONLY ONE VISITOR IS ALLOWED TO COME WITH YOU AND STAY IN THE WAITING ROOM ONLY DURING PRE OP AND PROCEDURE.   **NO VISITORS ARE ALLOWED IN THE SHORT STAY AREA OR RECOVERY ROOM!!**  IF YOU WILL BE ADMITTED INTO THE HOSPITAL YOU ARE ALLOWED ONLY TWO SUPPORT PEOPLE DURING VISITATION HOURS ONLY (7 AM -8PM)    Up to two visitors ages 63+ are allowed at one time in a patient's room.  The visitors may rotate out with other people throughout the day.  Additionally, up to two children between the ages of 43 and 54 are allowed and do not count toward the number of allowed visitors.  Children within this age range must be accompanied by an adult visitor.  One adult visitor may remain with the patient overnight and must be in the room by 8 PM.  COVID SWAB TESTING MUST BE COMPLETED ON:  Friday, 10-04-21, Between the hours of 8 and 3  **MUST PRESENT COMPLETED FORM AT TESTING SITE**    [address] [place] (backside of the building)  You are not required to quarantine, however you are required to wear a well-fitted mask when you are out and around people not in your household.  Hand Hygiene often Do NOT share personal items Notify your provider if you are in close contact with someone who has COVID or you develop fever 100.4 or greater, new onset of sneezing, cough, sore throat, shortness of breath or body aches.        Your procedure is scheduled on: Tuesday, 10-08-21   Report to [facility] Main  Entrance     Report to admitting at 10:15 AM   Call this number if you have problems the morning of surgery [phone]   Do not eat food :After 6:00 PM.   May have liquids until 9:30 AM day of surgery  CLEAR LIQUID DIET  Foods Allowed                                                                     Foods Excluded  Water, Black Coffee (no milk/no creamer) and tea, regular and decaf                              liquids that you cannot  Plain Jell-O in any  flavor  (No red)                         see through such as: Fruit ices (not with fruit pulp)                                 milk, soups, orange juice  Iced Popsicles (No red)                                    All solid food                             Apple juices  Sports drinks like Gatorade (No red) Lightly seasoned clear broth or consume(fat free) Sugar     Complete one G2 drink the morning of surgery at 9:30 AM the day of surgery.       The day of surgery:  Drink ONE (1) Pre-Surgery G2 the morning of surgery. Drink in one sitting. Do not sip.  This drink was given to you during your hospital  pre-op appointment visit. Nothing else to drink after completing the Pre-Surgery Clear Ensure.          If you have questions, please contact your surgeon's office.     Oral Hygiene is also important to reduce your risk of infection.                                    Remember - BRUSH YOUR TEETH THE MORNING OF SURGERY WITH YOUR REGULAR TOOTHPASTE   Do NOT smoke after Midnight   Take these medicines the morning of surgery with A SIP OF WATER: Tylenol, Wellbutrin, Cymbalta, Gabapentin, Metoprolol, Augmentin  Xarelto - hold 2 days prior to surgery   Stop all vitamins and herbal supplements a week before surgery             You may not have any metal on your body including hair pins, jewelry, and body piercing             Do not wear make-up, lotions, powders, perfumes or deodorant  Do not wear nail polish including gel and S&S, artificial/acrylic nails, or any other type of covering on natural nails including finger and toenails. If you have artificial nails, gel coating, etc. that needs to be removed by a nail salon please have this removed prior to surgery or surgery may need to be canceled/ delayed if the surgeon/ anesthesia feels like they are unable to be safely monitored.   Do not shave  48 hours prior to surgery.   Do not bring valuables to the hospital. [place].   Contacts, dentures or bridgework may not be worn into surgery.   Bring small overnight bag day of surgery.   Please read over the following fact sheets you were given: IF YOU HAVE QUESTIONS ABOUT YOUR PRE OP INSTRUCTIONS PLEASE CALL [facility] - Preparing for Surgery Before surgery, you can play an important role.  Because skin is not sterile, your skin needs to be as free of germs as possible.  You can reduce the number of germs on your skin by washing with CHG (chlorahexidine gluconate) soap before surgery.  CHG is an antiseptic cleaner which kills germs and bonds with the skin to continue killing germs even after washing. Please DO NOT use if you have an allergy to CHG or antibacterial soaps.  If your skin becomes reddened/irritated stop using the CHG and inform your nurse when you arrive at Short Stay. Do not shave (including legs and underarms) for at least 48 hours prior to the first CHG shower.  You may shave your face/neck.  Please follow these instructions carefully:  1.  Shower with CHG Soap the night before surgery and the  morning of surgery.  2.  If you choose to wash your hair, wash your hair first as usual with your normal  shampoo.  3.  After you shampoo, rinse your hair and body thoroughly to remove the shampoo.  4.  Use CHG as you would any other liquid soap.  You can apply chg directly to the skin and wash.  Gently with a scrungie or clean washcloth.  5.  Apply the CHG Soap to your body ONLY FROM THE NECK DOWN.   Do   not use on face/ open                           Wound or open sores. Avoid contact with eyes, ears mouth and   genitals (private parts).                       Wash face,  Genitals (private parts) with your normal soap.             6.  Wash thoroughly, paying special attention to the area where your    surgery  will be performed.  7.  Thoroughly rinse your body with warm water from the  neck down.  8.  DO NOT shower/wash with your normal soap after using and rinsing off the CHG Soap.                9.  Pat yourself dry with a clean towel.            10.  Wear clean pajamas.            11.  Place clean sheets on your bed the night of your first shower and do not  sleep with pets. Day of Surgery : Do not apply any lotions/deodorants the morning of surgery.  Please wear clean clothes to the hospital/surgery center.  FAILURE TO FOLLOW THESE INSTRUCTIONS MAY RESULT IN THE CANCELLATION OF YOUR SURGERY  PATIENT SIGNATURE_________________________________  NURSE SIGNATURE__________________________________  ________________________________________________________________________   Adam Phenix  An incentive spirometer is a tool that can help keep your lungs clear and active. This tool measures how well you are filling your lungs with each breath. Taking long deep breaths may help reverse or decrease the chance of developing breathing (pulmonary) problems (especially infection) following: A long period of time when you are unable to move or be active. BEFORE THE PROCEDURE  If the spirometer includes an indicator to show your best effort, your nurse or respiratory therapist will set it to a desired goal. If possible, sit up straight or lean slightly forward. Try not to slouch. Hold the incentive spirometer in an upright position. INSTRUCTIONS FOR USE  Sit on the edge of your bed if possible, or sit up as far as you can in bed or on a chair. Hold the incentive spirometer in an upright position. Breathe out normally. Place the mouthpiece in your mouth and seal your lips tightly around it. Breathe in slowly and as deeply as possible, raising the piston or the ball toward the top of the column. Hold your breath for 3-5 seconds or for as long as possible. Allow the piston or ball to fall to the bottom of the column. Remove the mouthpiece from your mouth and breathe out  normally. Rest for a few seconds and repeat Steps 1 through 7 at least 10 times every 1-2 hours when you are awake. Take your time and take a few normal breaths between deep breaths. The spirometer may include an indicator to show your best effort. Use the indicator as a goal to work toward during each repetition. After each set of 10 deep breaths, practice coughing to be sure  your lungs are clear. If you have an incision (the cut made at the time of surgery), support your incision when coughing by placing a pillow or rolled up towels firmly against it. Once you are able to get out of bed, walk around indoors and cough well. You may stop using the incentive spirometer when instructed by your caregiver.  RISKS AND COMPLICATIONS Take your time so you do not get dizzy or light-headed. If you are in pain, you may need to take or ask for pain medication before doing incentive spirometry. It is harder to take a deep breath if you are having pain. AFTER USE Rest and breathe slowly and easily. It can be helpful to keep track of a log of your progress. Your caregiver can provide you with a simple table to help with this. If you are using the spirometer at home, follow these instructions: [place] IF:  You are having difficultly using the spirometer. You have trouble using the spirometer as often as instructed. Your pain medication is not giving enough relief while using the spirometer. You develop fever of 100.5 F (38.1 C) or higher. SEEK IMMEDIATE MEDICAL CARE IF:  You cough up bloody sputum that had not been present before. You develop fever of 102 F (38.9 C) or greater. You develop worsening pain at or near the incision site. MAKE SURE YOU:  Understand these instructions. Will watch your condition. Will get help right away if you are not doing well or get worse. Document Released: 02/23/2007 Document Revised: 01/05/2012 Document Reviewed: 04/26/2007 ExitCare Patient Information  2014 ExitCare, [place].   ________________________________________________________________________  WHAT IS A BLOOD TRANSFUSION? Blood Transfusion Information  A transfusion is the replacement of blood or some of its parts. Blood is made up of multiple cells which provide different functions. Red blood cells carry oxygen and are used for blood loss replacement. White blood cells fight against infection. Platelets control bleeding. Plasma helps clot blood. Other blood products are available for specialized needs, such as hemophilia or other clotting disorders. BEFORE THE TRANSFUSION  Who gives blood for transfusions?  Healthy volunteers who are fully evaluated to make sure their blood is safe. This is blood bank blood. Transfusion therapy is the safest it has ever been in the practice of medicine. Before blood is taken from a donor, a complete history is taken to make sure that person has no history of diseases nor engages in risky social behavior (examples are intravenous drug use or sexual activity with multiple partners). The donor's travel history is screened to minimize risk of transmitting infections, such as malaria. The donated blood is tested for signs of infectious diseases, such as HIV and hepatitis. The blood is then tested to be sure it is compatible with you in order to minimize the chance of a transfusion reaction. If you or a relative donates blood, this is often done in anticipation of surgery and is not appropriate for emergency situations. It takes many days to process the donated blood. RISKS AND COMPLICATIONS Although transfusion therapy is very safe and saves many lives, the main dangers of transfusion include:  Getting an infectious disease. Developing a transfusion reaction. This is an allergic reaction to something in the blood you were given. Every precaution is taken to prevent this. The decision to have a blood transfusion has been considered carefully by your caregiver  before blood is given. Blood is not given unless the benefits outweigh the risks. AFTER THE TRANSFUSION Right after receiving a blood  transfusion, you will usually feel much better and more energetic. This is especially true if your red blood cells have gotten low (anemic). The transfusion raises the level of the red blood cells which carry oxygen, and this usually causes an energy increase. The nurse administering the transfusion will monitor you carefully for complications. HOME CARE INSTRUCTIONS  No special instructions are needed after a transfusion. You may find your energy is better. Speak with your caregiver about any limitations on activity for underlying diseases you may have. SEEK MEDICAL CARE IF:  Your condition is not improving after your transfusion. You develop redness or irritation at the intravenous (IV) site. SEEK IMMEDIATE MEDICAL CARE IF:  Any of the following symptoms occur over the next 12 hours: Shaking chills. You have a temperature by mouth above 102 F (38.9 C), not controlled by medicine. Chest, back, or muscle pain. People around you feel you are not acting correctly or are confused. Shortness of breath or difficulty breathing. Dizziness and fainting. You get a rash or develop hives. You have a decrease in urine output. Your urine turns a dark color or changes to pink, red, or brown. Any of the following symptoms occur over the next 10 days: You have a temperature by mouth above 102 F (38.9 C), not controlled by medicine. Shortness of breath. Weakness after normal activity. The white part of the eye turns yellow (jaundice). You have a decrease in the amount of urine or are urinating less often. Your urine turns a dark color or changes to pink, red, or brown. Document Released: 10/10/2000 Document Revised: 01/05/2012 Document Reviewed: 05/29/2008 [facility] Patient Information 2014 [place],  [place].  _______________________________________________________________________

## 2021-09-23 ENCOUNTER — Encounter: Payer: Medicare Other | Attending: Family Medicine | Admitting: Skilled Nursing Facility1

## 2021-09-23 ENCOUNTER — Other Ambulatory Visit: Payer: Self-pay

## 2021-09-23 NOTE — Progress Notes (Signed)
Pre-Operative Nutrition Class:    Patient was seen on 09/23/2021 for Pre-Operative Bariatric Surgery Education at [facility].    Surgery date: 10/08/2021 Surgery type: sleeve Start weight at [facility]: 327.9 Weight today: 321.3  Samples given per MNT protocol. Patient educated on appropriate usage: Ensure max exp: December 25, 2021 Ensure max lot: [id number]  Chewable bariatric advantage: advanced multi EA exp: 08/23 Chewable bariatric advantage: advanced multi EA lot: [id number]  Bariatric advantage calcium citrate exp: 02/23 Bariatric advantage calcium citrate lot: [id number]   The following the learning objectives were met by the patient during this course: Identify Pre-Op Dietary Goals and will begin 2 weeks pre-operatively Identify appropriate sources of fluids and proteins  State protein recommendations and appropriate sources pre and post-operatively Identify Post-Operative Dietary Goals and will follow for 2 weeks post-operatively Identify appropriate multivitamin and calcium sources Describe the need for physical activity post-operatively and will follow MD recommendations State when to call healthcare provider regarding medication questions or post-operative complications When having a diagnosis of diabetes understanding hypoglycemia symptoms and the inclusion of 1 complex carbohydrate per meal  Handouts given during class include: Pre-Op Bariatric Surgery Diet Handout Protein Shake Handout Post-Op Bariatric Surgery Nutrition Handout BELT Program Information Flyer Support Group Information Flyer [facility] Bariatric Supplements Price List  Follow-Up Plan: Patient will follow-up at [facility] 2 weeks post operatively for diet advancement per MD.

## 2021-09-24 ENCOUNTER — Encounter (HOSPITAL_COMMUNITY)
Admission: RE | Admit: 2021-09-24 | Discharge: 2021-09-24 | Disposition: A | Discharge status: Home / Self Care | Payer: Medicare Other | Source: Ambulatory Visit | Attending: Surgery | Admitting: Surgery

## 2021-09-24 ENCOUNTER — Encounter (HOSPITAL_COMMUNITY): Payer: Self-pay | Admitting: *Deleted

## 2021-09-24 ENCOUNTER — Other Ambulatory Visit: Payer: Self-pay

## 2021-09-24 VITALS — BP 149/91 | HR 82 | Temp 98.3°F | Resp 20 | Ht 62.0 in | Wt 317.6 lb

## 2021-09-24 DIAGNOSIS — Z01812 Encounter for preprocedural laboratory examination: Secondary | ICD-10-CM | POA: Diagnosis not present

## 2021-09-24 DIAGNOSIS — R7303 Prediabetes: Secondary | ICD-10-CM | POA: Insufficient documentation

## 2021-09-24 HISTORY — DX: Dyspnea, unspecified: R06.00

## 2021-09-24 HISTORY — DX: Family history of other specified conditions: Z84.89

## 2021-09-24 HISTORY — DX: Headache, unspecified: R51.9

## 2021-09-24 HISTORY — DX: Unspecified osteoarthritis, unspecified site: M19.90

## 2021-09-24 LAB — CBC WITH DIFFERENTIAL/PLATELET
Abs Immature Granulocytes: 0.03 10*3/uL (ref 0.00–0.07)
Basophils Absolute: 0.1 10*3/uL (ref 0.0–0.1)
Basophils Relative: 1 %
Eosinophils Absolute: 0.2 10*3/uL (ref 0.0–0.5)
Eosinophils Relative: 3 %
HCT: 45 % (ref 36.0–46.0)
Hemoglobin: 13.9 g/dL (ref 12.0–15.0)
Immature Granulocytes: 1 %
Lymphocytes Relative: 18 %
Lymphs Abs: 1.1 10*3/uL (ref 0.7–4.0)
MCH: 25.7 pg — ABNORMAL LOW (ref 26.0–34.0)
MCHC: 30.9 g/dL (ref 30.0–36.0)
MCV: 83.2 fL (ref 80.0–100.0)
Monocytes Absolute: 0.6 10*3/uL (ref 0.1–1.0)
Monocytes Relative: 10 %
Neutro Abs: 4.3 10*3/uL (ref 1.7–7.7)
Neutrophils Relative %: 67 %
Platelets: 308 10*3/uL (ref 150–400)
RBC: 5.41 MIL/uL — ABNORMAL HIGH (ref 3.87–5.11)
RDW: 15.9 % — ABNORMAL HIGH (ref 11.5–15.5)
WBC: 6.4 10*3/uL (ref 4.0–10.5)
nRBC: 0 % (ref 0.0–0.2)

## 2021-09-24 LAB — COMPREHENSIVE METABOLIC PANEL
ALT: 18 U/L (ref 0–44)
AST: 20 U/L (ref 15–41)
Albumin: 4 g/dL (ref 3.5–5.0)
Alkaline Phosphatase: 55 U/L (ref 38–126)
Anion gap: 6 (ref 5–15)
BUN: 30 mg/dL — ABNORMAL HIGH (ref 8–23)
CO2: 27 mmol/L (ref 22–32)
Calcium: 9.1 mg/dL (ref 8.9–10.3)
Chloride: 103 mmol/L (ref 98–111)
Creatinine, Ser: 1.09 mg/dL — ABNORMAL HIGH (ref 0.44–1.00)
GFR, Estimated: 57 mL/min — ABNORMAL LOW (ref >60)
Glucose, Bld: 93 mg/dL (ref 70–99)
Potassium: 4.6 mmol/L (ref 3.5–5.1)
Sodium: 136 mmol/L (ref 135–145)
Total Bilirubin: 0.8 mg/dL (ref 0.3–1.2)
Total Protein: 7.4 g/dL (ref 6.5–8.1)

## 2021-09-24 LAB — TYPE AND SCREEN
ABO/RH(D): A POS
Antibody Screen: NEGATIVE

## 2021-09-24 LAB — GLUCOSE, CAPILLARY: Glucose-Capillary: 85 mg/dL (ref 70–99)

## 2021-09-25 LAB — HEMOGLOBIN A1C
Hgb A1c MFr Bld: 6 % — ABNORMAL HIGH (ref 4.8–5.6)
Mean Plasma Glucose: 126 mg/dL

## 2021-09-26 ENCOUNTER — Other Ambulatory Visit: Payer: Self-pay | Admitting: Family Medicine

## 2021-09-26 ENCOUNTER — Ambulatory Visit
Admission: RE | Admit: 2021-09-26 | Discharge: 2021-09-26 | Disposition: A | Discharge status: Home / Self Care | Payer: Medicare Other | Source: Ambulatory Visit | Attending: Family Medicine | Admitting: Family Medicine

## 2021-09-26 DIAGNOSIS — I700 Atherosclerosis of aorta: Secondary | ICD-10-CM | POA: Diagnosis not present

## 2021-09-26 DIAGNOSIS — R059 Cough, unspecified: Secondary | ICD-10-CM | POA: Diagnosis not present

## 2021-09-26 DIAGNOSIS — J40 Bronchitis, not specified as acute or chronic: Secondary | ICD-10-CM

## 2021-09-26 DIAGNOSIS — R0602 Shortness of breath: Secondary | ICD-10-CM | POA: Diagnosis not present

## 2021-09-26 DIAGNOSIS — J0110 Acute frontal sinusitis, unspecified: Secondary | ICD-10-CM | POA: Diagnosis not present

## 2021-09-26 DIAGNOSIS — R062 Wheezing: Secondary | ICD-10-CM | POA: Diagnosis not present

## 2021-10-03 ENCOUNTER — Other Ambulatory Visit: Payer: Self-pay

## 2021-10-03 MED ORDER — XARELTO 20 MG PO TABS: ORAL_TABLET | ORAL | 1 refills | Status: DC

## 2021-10-03 NOTE — Addendum Note (Signed)
Addended by: Memory Dance on: 10/03/2021 12:57 PM   Modules accepted: Orders

## 2021-10-03 NOTE — Telephone Encounter (Signed)
Prescription refill request for Xarelto received.  Indication:Afib  Last office visit:05/13/21 Dan Humphreys)  Weight:144.1kg Age: 65 Scr: 1.09 (09/24/21)  CrCl: 120.22ml/min  Appropriate dose and refill sent to requested pharmacy.

## 2021-10-04 ENCOUNTER — Ambulatory Visit: Payer: Medicare Other | Admitting: Internal Medicine

## 2021-10-04 ENCOUNTER — Other Ambulatory Visit: Payer: Self-pay | Admitting: Surgery

## 2021-10-04 ENCOUNTER — Other Ambulatory Visit: Payer: Self-pay

## 2021-10-04 ENCOUNTER — Encounter: Payer: Self-pay | Admitting: Internal Medicine

## 2021-10-04 VITALS — BP 134/70 | HR 86 | Ht 62.0 in | Wt 321.2 lb

## 2021-10-04 DIAGNOSIS — I4811 Longstanding persistent atrial fibrillation: Secondary | ICD-10-CM

## 2021-10-04 DIAGNOSIS — I10 Essential (primary) hypertension: Secondary | ICD-10-CM | POA: Diagnosis not present

## 2021-10-04 DIAGNOSIS — R0602 Shortness of breath: Secondary | ICD-10-CM | POA: Diagnosis not present

## 2021-10-04 DIAGNOSIS — R0683 Snoring: Secondary | ICD-10-CM | POA: Diagnosis not present

## 2021-10-04 NOTE — Progress Notes (Signed)
PCP: Laurann Montana, MD   Primary EP: Dr Johney Frame  [name] is a 65 y.o. female who presents today for routine electrophysiology followup.  Since last being seen in our clinic, the patient reports doing very well.  Today, she denies symptoms of palpitations, chest pain, lower extremity edema, dizziness, presyncope, or syncope.  Her SOB is stable and chronic.  The patient is otherwise without complaint today.   Past Medical History:  Diagnosis Date   Arthritis    Atrial fibrillation (HCC)    Chronic lower back pain    Depression    DVT (deep venous thrombosis) (HCC) 1990s   LLE   Dyspnea    [place]    Family history of adverse reaction to anesthesia    Mother had rash and itching after anesthesia   Headache    Hypertension    Hyperthyroidism ~ 2000   "fine now" (04/23/2016)   Joint pain    Lactose intolerance    Leg edema    Obesity    Pneumonia 1980s X 1   Snoring    has not had sleep study but suspects sleep apnea   Past Surgical History:  Procedure Laterality Date   BIOPSY  08/09/2021   Procedure: BIOPSY;  Surgeon: Quentin Ore, MD;  Location: [facility] ENDOSCOPY;  Service: General;;   CARDIOVERSION N/A 06/18/2016   Procedure: CARDIOVERSION;  Surgeon: Laurey Morale, MD;  Location: [facility] ENDOSCOPY;  Service: Cardiovascular;  Laterality: N/A;   CARDIOVERSION N/A 07/11/2016   Procedure: CARDIOVERSION;  Surgeon: Lewayne Bunting, MD;  Location[facility] ENDOSCOPY;  Service: Cardiovascular;  Laterality: N/A;   CARDIOVERSION N/A 07/02/2017   Procedure: CARDIOVERSION;  Surgeon: Thurmon Fair, MD;  Location: [facility] ENDOSCOPY;  Service: Cardiovascular;  Laterality: N/A;   CARDIOVERSION N/A 09/29/2017   Procedure: CARDIOVERSION;  Surgeon: Laurey Morale, MD;  Location: [facility] ENDOSCOPY;  Service: Cardiovascular;  Laterality: N/A;   ENDOMETRIAL ABLATION  ~2006   ESOPHAGOGASTRODUODENOSCOPY N/A 08/09/2021   Procedure: ESOPHAGOGASTRODUODENOSCOPY (EGD);  Surgeon: Quentin Ore,  MD;  Location: [facility] ENDOSCOPY;  Service: General;  Laterality: N/A;   TONSILLECTOMY  1960s   TUBAL LIGATION  1979    ROS- all systems are reviewed and negatives except as per HPI above  Current Outpatient Medications  Medication Sig Dispense Refill   acetaminophen (TYLENOL) 325 MG tablet Take 650 mg by mouth every 4 (four) hours as needed for moderate pain.     albuterol (VENTOLIN HFA) 108 (90 Base) MCG/ACT inhaler SMARTSIG:2 Puff(s) By Mouth Every 4 Hours PRN     amphetamine-dextroamphetamine (ADDERALL XR) 15 MG 24 hr capsule Take 15 mg by mouth 2 (two) times daily.     Ascorbic Acid (VITAMIN C GUMMIE PO) Take 2 tablets by mouth daily.     buPROPion (WELLBUTRIN XL) 150 MG 24 hr tablet Take 150 mg by mouth daily.     DULoxetine (CYMBALTA) 30 MG capsule Take 60 mg by mouth daily.     gabapentin (NEURONTIN) 400 MG capsule Take 400 mg by mouth 3 (three) times daily.     metoprolol tartrate (LOPRESSOR) 25 MG tablet TAKE 1/2 TABLET(12.5 MG TOTAL) BY MOUTH TWICE DAILY 90 tablet 3   triamcinolone ointment (KENALOG) 0.1 % Apply 1 application topically daily as needed (rash).     triamterene-hydrochlorothiazide (MAXZIDE-25) 37.5-25 MG tablet Take 1 tablet by mouth every morning.      XARELTO 20 MG TABS tablet TAKE 1 TABLET(20 MG) BY MOUTH DAILY WITH SUPPER 90 tablet 1  amoxicillin-clavulanate (AUGMENTIN) 875-125 MG tablet Take 1 tablet by mouth 2 (two) times daily. (Patient not taking: Reported on 10/04/2021)     buPROPion (WELLBUTRIN SR) 200 MG 12 hr tablet Take 1 tablet (200 mg total) by mouth daily. (Patient not taking: Reported on 09/17/2021) 30 tablet 0   No current facility-administered medications for this visit.    Physical Exam: Vitals:   10/04/21 1357  BP: 134/70  Pulse: 86  SpO2: 98%  Weight: (!) 321 lb 3.2 oz (145.7 kg)  Height: 5\' 2"  (1.575 m)    GEN- The patient is well appearing, alert and oriented x 3 today.   Head- normocephalic, atraumatic Eyes-  Sclera clear, conjunctiva  pink Ears- hearing intact Oropharynx- clear Lungs- Clear to ausculation bilaterally, normal work of breathing Heart- Regular rate and rhythm, no murmurs, rubs or gallops, PMI not laterally displaced GI- soft, NT, ND, + BS Extremities- no clubbing, cyanosis, or edema  Wt Readings from Last 3 Encounters:  10/04/21 (!) 321 lb 3.2 oz (145.7 kg)  09/24/21 (!) 317 lb 9.6 oz (144.1 kg)  09/23/21 (!) 321 lb 4.8 oz (145.7 kg)    EKG tracing ordered today is personally reviewed and shows afib, V rates 80s, PVCs  Assessment and Plan:  Longstanding persistent afib Minimally symptomatic She has done well with rate control Continue xarelto  2. Obesity Body mass index is 58.75 kg/m. Lifestyle modification advised  3. HTN Stable No change required today  4. Preop She is scheduled for gastric bypass next week Ok to proceed without further CV testing Ok to hold xarelto 1-2 days prior to surgery and resume when able post operatively.  Given prior DVT off of xarelto, should resume as soon as possible post procedure.  5. OSA Uses BiPAP  Risks, benefits and potential toxicities for medications prescribed and/or refilled reviewed with patient today.   Return to see EP APP in a year  Thompson Grayer MD, [name] 10/04/2021 2:16 PM

## 2021-10-04 NOTE — Patient Instructions (Addendum)
Medication Instructions:  Your physician recommends that you continue on your current medications as directed. Please refer to the Current Medication list given to you today. *If you need a refill on your cardiac medications before your next appointment, please call your pharmacy*  Lab Work: None. If you have labs (blood work) drawn today and your tests are completely normal, you will receive your results only by: MyChart Message (if you have MyChart) OR A paper copy in the mail If you have any lab test that is abnormal or we need to change your treatment, we will call you to review the results.  Testing/Procedures: None.  Follow-Up: At [facility], you and your health needs are our priority.  As part of our continuing mission to provide you with exceptional heart care, we have created designated Provider Care Teams.  These Care Teams include your primary Cardiologist (physician) and Advanced Practice Providers (APPs -  Physician Assistants and Nurse Practitioners) who all work together to provide you with the care you need, when you need it.  Your physician wants you to follow-up in: 12 months with    one of the following Advanced Practice Providers on your designated Care Team:    Renee Ursuy, PA-C    You will receive a reminder letter in the mail two months in advance. If you don't receive a letter, please call our office to schedule the follow-up appointment.  We recommend signing up for the patient portal called "MyChart".  Sign up information is provided on this After Visit Summary.  MyChart is used to connect with patients for Virtual Visits (Telemedicine).  Patients are able to view lab/test results, encounter notes, upcoming appointments, etc.  Non-urgent messages can be sent to your provider as well.   To learn more about what you can do with MyChart, go to [email or web address].    Any Other Special Instructions Will Be Listed Below (If Applicable).         

## 2021-10-07 LAB — SARS CORONAVIRUS 2 (TAT 6-24 HRS): SARS Coronavirus 2: NEGATIVE

## 2021-10-07 NOTE — Progress Notes (Signed)
Anesthesia Chart Review   Case: [id number] Date/Time: 10/08/21 1215   Procedures:      LAPAROSCOPIC GASTRIC SLEEVE RESECTION     UPPER GI ENDOSCOPY     HERNIA REPAIR HIATAL   Anesthesia type: General   Pre-op diagnosis: MORBID OBESITY   Location: [facility] ROOM 01 / [facility]   Surgeons: Berna Bue, MD       DISCUSSION:65 y.o. former smoker with h/o HTN, DVT, atrial fibrillation (on Xarelto), morbid obesity scheduled for above procedure 10/08/2021 with Dr. Phylliss Blakes.   Pt seen by cardiology 10/04/2021. Per OV note, "She is scheduled for gastric bypass next week Ok to proceed without further CV testing Ok to hold xarelto 1-2 days prior to surgery and resume when able post operatively.  Given prior DVT off of xarelto, should resume as soon as possible post procedure."  Anticipate pt can proceed with planned procedure barring acute status change.   VS: BP (!) 149/91   Pulse 82   Temp 36.8 C (Oral)   Resp 20   Ht 5\' 2"  (1.575 m)   Wt (!) 144.1 kg   SpO2 98%   BMI 58.09 kg/m   PROVIDERS: , MD is PCP   Laurann Montana, MD is Cardiologist  LABS: Labs reviewed: Acceptable for surgery. (all labs ordered are listed, but only abnormal results are displayed)  Labs Reviewed  CBC WITH DIFFERENTIAL/PLATELET - Abnormal; Notable for the following components:      Result Value   RBC 5.41 (*)    MCH 25.7 (*)    RDW 15.9 (*)    All other components within normal limits  COMPREHENSIVE METABOLIC PANEL - Abnormal; Notable for the following components:   BUN 30 (*)    Creatinine, Ser 1.09 (*)    GFR, Estimated 57 (*)    All other components within normal limits  HEMOGLOBIN A1C - Abnormal; Notable for the following components:   Hgb A1c MFr Bld 6.0 (*)    All other components within normal limits  GLUCOSE, CAPILLARY  TYPE AND SCREEN     IMAGES:   EKG: 05/13/2021 Rate 84 bpm  Atrial fibrillation with premature ventricular or aberrantly conducted  complexes   CV: Stress Test 02/23/2020 The left ventricular ejection fraction is normal (55-65%). Nuclear stress EF: 49%. There was no ST segment deviation noted during stress. The study is normal. This is a low risk study.   Normal pharmacologic nuclear stress test with no evidence for prior infarct or ischemia. LVEF 55%. Frequent PVCs during stress and in recovery.  Echo 02/23/2020 1. Left ventricular ejection fraction, by estimation, is 55 to 60%. The  left ventricle has normal function. The left ventricle has no regional  wall motion abnormalities. There is moderate left ventricular hypertrophy.  Left ventricular diastolic function   could not be evaluated.   2. Right ventricular systolic function is normal. The right ventricular  size is normal. There is normal pulmonary artery systolic pressure.   3. Left atrial size was severely dilated.   4. The mitral valve is abnormal. Mild to moderate mitral valve  regurgitation.   5. The aortic valve is tricuspid. Aortic valve regurgitation is not  visualized.   6. Aortic dilatation noted. There is mild dilatation of the ascending  aorta measuring 42 mm.   7. The inferior vena cava is normal in size with greater than 50%  respiratory variability, suggesting right atrial pressure of 3 mmHg.  Past Medical History:  Diagnosis Date  Arthritis    Atrial fibrillation (HCC)    Chronic lower back pain    Depression    DVT (deep venous thrombosis) (HCC) 1990s   LLE   Dyspnea    Fallen arches    Family history of adverse reaction to anesthesia    Mother had rash and itching after anesthesia   Headache    Hypertension    Hyperthyroidism ~ 2000   "fine now" (04/23/2016)   Joint pain    Lactose intolerance    Leg edema    Obesity    Pneumonia 1980s X 1   Snoring    has not had sleep study but suspects sleep apnea    Past Surgical History:  Procedure Laterality Date   BIOPSY  08/09/2021   Procedure: BIOPSY;  Surgeon:  Felicie Morn, MD;  Location: [facility];  Service: General;;   CARDIOVERSION N/A 06/18/2016   Procedure: CARDIOVERSION;  Surgeon: Larey Dresser, MD;  Location: [facility];  Service: Cardiovascular;  Laterality: N/A;   CARDIOVERSION N/A 07/11/2016   Procedure: CARDIOVERSION;  Surgeon: Lelon Perla, MD;  Location: [facility];  Service: Cardiovascular;  Laterality: N/A;   CARDIOVERSION N/A 07/02/2017   Procedure: CARDIOVERSION;  Surgeon: Sanda Klein, MD;  Location: [facility];  Service: Cardiovascular;  Laterality: N/A;   CARDIOVERSION N/A 09/29/2017   Procedure: CARDIOVERSION;  Surgeon: Larey Dresser, MD;  Location: [facility] ENDOSCOPY;  Service: Cardiovascular;  Laterality: N/A;   ENDOMETRIAL ABLATION  ~2006   ESOPHAGOGASTRODUODENOSCOPY N/A 08/09/2021   Procedure: ESOPHAGOGASTRODUODENOSCOPY (EGD);  Surgeon: Felicie Morn, MD;  Location: [facility];  Service: General;  Laterality: N/A;   TONSILLECTOMY  1960s   TUBAL LIGATION  1979    MEDICATIONS:  acetaminophen (TYLENOL) 325 MG tablet   albuterol (VENTOLIN HFA) 108 (90 Base) MCG/ACT inhaler   amoxicillin-clavulanate (AUGMENTIN) 875-125 MG tablet   amphetamine-dextroamphetamine (ADDERALL XR) 15 MG 24 hr capsule   Ascorbic Acid (VITAMIN C GUMMIE PO)   buPROPion (WELLBUTRIN SR) 200 MG 12 hr tablet   buPROPion (WELLBUTRIN XL) 150 MG 24 hr tablet   DULoxetine (CYMBALTA) 30 MG capsule   gabapentin (NEURONTIN) 400 MG capsule   metoprolol tartrate (LOPRESSOR) 25 MG tablet   triamcinolone ointment (KENALOG) 0.1 %   triamterene-hydrochlorothiazide (MAXZIDE-25) 37.5-25 MG tablet   XARELTO 20 MG TABS tablet   No current facility-administered medications for this encounter.   Konrad Felix Ward, PA-C [facility] [phone]

## 2021-10-07 NOTE — Anesthesia Preprocedure Evaluation (Addendum)
Anesthesia Evaluation  Patient identified by MRN, date of birth, ID band  Reviewed: Allergy & Precautions, NPO status , Patient's Chart, lab work & pertinent test results  Airway Mallampati: III  TM Distance: >3 FB Neck ROM: Full    Dental no notable dental hx.    Pulmonary shortness of breath, former smoker,    Pulmonary exam normal breath sounds clear to auscultation       Cardiovascular Exercise Tolerance: Poor hypertension, Pt. on medications Normal cardiovascular exam+ dysrhythmias Atrial Fibrillation  Rhythm:Regular Rate:Normal  EKG: 05/13/2021 Rate 84 bpm  Atrial fibrillation with premature ventricular or aberrantly conducted complexes   CV: Stress Test 02/23/2020 ? The left ventricular ejection fraction is normal (55-65%). ? Nuclear stress EF: 49%. ? There was no ST segment deviation noted during stress. ? The study is normal. ? This is a low risk study.  Normal pharmacologic nuclear stress test with no evidence for prior infarct or ischemia. LVEF 55%. Frequent PVCs during stress and in recovery.  Echo 02/23/2020 1. Left ventricular ejection fraction, by estimation, is 55 to 60%. The  left ventricle has normal function. The left ventricle has no regional  wall motion abnormalities. There is moderate left ventricular hypertrophy.  Left ventricular diastolic function  could not be evaluated.  2. Right ventricular systolic function is normal. The right ventricular  size is normal. There is normal pulmonary artery systolic pressure.  3. Left atrial size was severely dilated.  4. The mitral valve is abnormal. Mild to moderate mitral valve  regurgitation.  5. The aortic valve is tricuspid. Aortic valve regurgitation is not  visualized.  6. Aortic dilatation noted. There is mild dilatation of the ascending  aorta measuring 42 mm.  7. The inferior vena cava is normal in size with greater than 50%  respiratory  variability, suggesting right atrial pressure of 3 mmHg.    Neuro/Psych  Headaches, PSYCHIATRIC DISORDERS Depression    GI/Hepatic negative GI ROS, Neg liver ROS,   Endo/Other  Morbid obesity  Renal/GU negative Renal ROS  negative genitourinary   Musculoskeletal  (+) Arthritis , Osteoarthritis,    Abdominal (+) + obese,   Peds negative pediatric ROS (+)  Hematology negative hematology ROS (+)   Anesthesia Other Findings   Reproductive/Obstetrics                           Anesthesia Physical Anesthesia Plan  ASA: 4  Anesthesia Plan: General   Post-op Pain Management: Tylenol PO (pre-op) and Gabapentin PO (pre-op)   Induction: Intravenous  PONV Risk Score and Plan: 3 and Treatment may vary due to age or medical condition  Airway Management Planned: Oral ETT  Additional Equipment: None  Intra-op Plan:   Post-operative Plan: Extubation in OR  Informed Consent: I have reviewed the patients History and Physical, chart, labs and discussed the procedure including the risks, benefits and alternatives for the proposed anesthesia with the patient or authorized representative who has indicated his/her understanding and acceptance.     Dental advisory given  Plan Discussed with: CRNA and Anesthesiologist  Anesthesia Plan Comments:       Anesthesia Quick Evaluation

## 2021-10-08 ENCOUNTER — Encounter (HOSPITAL_COMMUNITY): Payer: Self-pay | Admitting: Surgery

## 2021-10-08 ENCOUNTER — Other Ambulatory Visit: Payer: Self-pay

## 2021-10-08 ENCOUNTER — Inpatient Hospital Stay (HOSPITAL_COMMUNITY): Payer: Medicare Other | Admitting: Certified Registered Nurse Anesthetist

## 2021-10-08 ENCOUNTER — Encounter (HOSPITAL_COMMUNITY)
Admission: RE | Disposition: A | Discharge status: Home / Self Care | Payer: Self-pay | Source: Home / Self Care | Attending: Surgery

## 2021-10-08 ENCOUNTER — Inpatient Hospital Stay (HOSPITAL_COMMUNITY)
Admission: RE | Admit: 2021-10-08 | Discharge: 2021-10-10 | DRG: 620 | Disposition: A | Discharge status: Home / Self Care | Payer: Medicare Other | Attending: Surgery | Admitting: Surgery

## 2021-10-08 ENCOUNTER — Inpatient Hospital Stay (HOSPITAL_COMMUNITY): Payer: Medicare Other | Admitting: Physician Assistant

## 2021-10-08 DIAGNOSIS — Z885 Allergy status to narcotic agent status: Secondary | ICD-10-CM

## 2021-10-08 DIAGNOSIS — K2100 Gastro-esophageal reflux disease with esophagitis, without bleeding: Secondary | ICD-10-CM | POA: Diagnosis not present

## 2021-10-08 DIAGNOSIS — Z86718 Personal history of other venous thrombosis and embolism: Secondary | ICD-10-CM | POA: Diagnosis not present

## 2021-10-08 DIAGNOSIS — Z87891 Personal history of nicotine dependence: Secondary | ICD-10-CM | POA: Diagnosis not present

## 2021-10-08 DIAGNOSIS — M1711 Unilateral primary osteoarthritis, right knee: Secondary | ICD-10-CM | POA: Diagnosis present

## 2021-10-08 DIAGNOSIS — K449 Diaphragmatic hernia without obstruction or gangrene: Secondary | ICD-10-CM | POA: Diagnosis present

## 2021-10-08 DIAGNOSIS — Z6841 Body Mass Index (BMI) 40.0 and over, adult: Secondary | ICD-10-CM

## 2021-10-08 DIAGNOSIS — I119 Hypertensive heart disease without heart failure: Secondary | ICD-10-CM | POA: Diagnosis not present

## 2021-10-08 DIAGNOSIS — Z888 Allergy status to other drugs, medicaments and biological substances status: Secondary | ICD-10-CM

## 2021-10-08 DIAGNOSIS — Z79899 Other long term (current) drug therapy: Secondary | ICD-10-CM | POA: Diagnosis not present

## 2021-10-08 DIAGNOSIS — I482 Chronic atrial fibrillation, unspecified: Secondary | ICD-10-CM | POA: Diagnosis not present

## 2021-10-08 DIAGNOSIS — I10 Essential (primary) hypertension: Secondary | ICD-10-CM | POA: Diagnosis not present

## 2021-10-08 DIAGNOSIS — E0590 Thyrotoxicosis, unspecified without thyrotoxic crisis or storm: Secondary | ICD-10-CM | POA: Diagnosis present

## 2021-10-08 DIAGNOSIS — Z8614 Personal history of Methicillin resistant Staphylococcus aureus infection: Secondary | ICD-10-CM | POA: Diagnosis not present

## 2021-10-08 DIAGNOSIS — G4733 Obstructive sleep apnea (adult) (pediatric): Secondary | ICD-10-CM | POA: Diagnosis not present

## 2021-10-08 DIAGNOSIS — Z8249 Family history of ischemic heart disease and other diseases of the circulatory system: Secondary | ICD-10-CM | POA: Diagnosis not present

## 2021-10-08 DIAGNOSIS — M545 Low back pain, unspecified: Secondary | ICD-10-CM | POA: Diagnosis present

## 2021-10-08 DIAGNOSIS — Z7901 Long term (current) use of anticoagulants: Secondary | ICD-10-CM

## 2021-10-08 DIAGNOSIS — K219 Gastro-esophageal reflux disease without esophagitis: Secondary | ICD-10-CM | POA: Diagnosis not present

## 2021-10-08 DIAGNOSIS — Z981 Arthrodesis status: Secondary | ICD-10-CM | POA: Diagnosis not present

## 2021-10-08 DIAGNOSIS — Z91040 Latex allergy status: Secondary | ICD-10-CM

## 2021-10-08 DIAGNOSIS — G8929 Other chronic pain: Secondary | ICD-10-CM | POA: Diagnosis present

## 2021-10-08 DIAGNOSIS — I480 Paroxysmal atrial fibrillation: Secondary | ICD-10-CM | POA: Diagnosis not present

## 2021-10-08 DIAGNOSIS — F32A Depression, unspecified: Secondary | ICD-10-CM | POA: Diagnosis not present

## 2021-10-08 DIAGNOSIS — Z9851 Tubal ligation status: Secondary | ICD-10-CM | POA: Diagnosis not present

## 2021-10-08 HISTORY — PX: UPPER GI ENDOSCOPY: SHX6162

## 2021-10-08 HISTORY — PX: LAPAROSCOPIC GASTRIC SLEEVE RESECTION: SHX5895

## 2021-10-08 HISTORY — PX: HIATAL HERNIA REPAIR: SHX195

## 2021-10-08 LAB — ABO/RH: ABO/RH(D): A POS

## 2021-10-08 SURGERY — GASTRECTOMY, SLEEVE, LAPAROSCOPIC
Anesthesia: General | Site: Abdomen

## 2021-10-08 MED ORDER — HYDROMORPHONE HCL 1 MG/ML IJ SOLN
0.2500 mg | INTRAMUSCULAR | Status: DC | PRN
  Administered 2021-10-08: 0.25 mg via INTRAVENOUS
  Administered 2021-10-08: 0.5 mg via INTRAVENOUS

## 2021-10-08 MED ORDER — ACETAMINOPHEN 160 MG/5ML PO SOLN: 1000.0000 mg | Freq: Three times a day (TID) | ORAL | Status: DC

## 2021-10-08 MED ORDER — BUPIVACAINE LIPOSOME 1.3 % IJ SUSP
INTRAMUSCULAR | Status: AC
  Filled 2021-10-08: qty 20

## 2021-10-08 MED ORDER — METOPROLOL TARTRATE 5 MG/5ML IV SOLN: 5.0000 mg | Freq: Four times a day (QID) | INTRAVENOUS | Status: DC | PRN

## 2021-10-08 MED ORDER — FENTANYL CITRATE (PF) 250 MCG/5ML IJ SOLN
INTRAMUSCULAR | Status: AC
  Filled 2021-10-08: qty 5

## 2021-10-08 MED ORDER — METHOCARBAMOL 500 MG IVPB - SIMPLE MED
500.0000 mg | Freq: Four times a day (QID) | INTRAVENOUS | Status: DC | PRN
  Administered 2021-10-08: 500 mg via INTRAVENOUS
  Filled 2021-10-08: qty 50

## 2021-10-08 MED ORDER — LACTATED RINGERS IR SOLN
Status: DC | PRN
  Administered 2021-10-08: 3000 mL

## 2021-10-08 MED ORDER — METOCLOPRAMIDE HCL 5 MG/ML IJ SOLN
10.0000 mg | Freq: Four times a day (QID) | INTRAMUSCULAR | Status: DC
  Administered 2021-10-08 – 2021-10-10 (×6): 10 mg via INTRAVENOUS
  Filled 2021-10-08 (×6): qty 2

## 2021-10-08 MED ORDER — HEPARIN SODIUM (PORCINE) 5000 UNIT/ML IJ SOLN
5000.0000 [IU] | INTRAMUSCULAR | Status: AC
  Administered 2021-10-08: 5000 [IU] via SUBCUTANEOUS
  Filled 2021-10-08: qty 1

## 2021-10-08 MED ORDER — HYDRALAZINE HCL 20 MG/ML IJ SOLN: 10.0000 mg | INTRAMUSCULAR | Status: DC | PRN

## 2021-10-08 MED ORDER — GABAPENTIN 300 MG PO CAPS
300.0000 mg | ORAL_CAPSULE | ORAL | Status: DC
  Filled 2021-10-08: qty 1

## 2021-10-08 MED ORDER — METHOCARBAMOL 500 MG IVPB - SIMPLE MED
INTRAVENOUS | Status: AC
  Filled 2021-10-08: qty 50

## 2021-10-08 MED ORDER — ALBUTEROL SULFATE (2.5 MG/3ML) 0.083% IN NEBU: 2.5000 mg | INHALATION_SOLUTION | RESPIRATORY_TRACT | Status: DC | PRN

## 2021-10-08 MED ORDER — AMISULPRIDE (ANTIEMETIC) 5 MG/2ML IV SOLN: 10.0000 mg | Freq: Once | INTRAVENOUS | Status: DC | PRN

## 2021-10-08 MED ORDER — LIDOCAINE HCL 2 % IJ SOLN
INTRAMUSCULAR | Status: AC
  Filled 2021-10-08: qty 20

## 2021-10-08 MED ORDER — ONDANSETRON HCL 4 MG/2ML IJ SOLN: 4.0000 mg | INTRAMUSCULAR | Status: DC | PRN

## 2021-10-08 MED ORDER — FENTANYL CITRATE (PF) 250 MCG/5ML IJ SOLN
INTRAMUSCULAR | Status: DC | PRN
  Administered 2021-10-08: 50 ug via INTRAVENOUS
  Administered 2021-10-08: 100 ug via INTRAVENOUS

## 2021-10-08 MED ORDER — SCOPOLAMINE 1 MG/3DAYS TD PT72
1.0000 | MEDICATED_PATCH | TRANSDERMAL | Status: DC
  Administered 2021-10-08: 1.5 mg via TRANSDERMAL
  Filled 2021-10-08: qty 1

## 2021-10-08 MED ORDER — ORAL CARE MOUTH RINSE: 15.0000 mL | Freq: Once | OROMUCOSAL | Status: AC

## 2021-10-08 MED ORDER — BUPROPION HCL ER (XL) 150 MG PO TB24
150.0000 mg | ORAL_TABLET | Freq: Every day | ORAL | Status: DC
  Administered 2021-10-09 – 2021-10-10 (×2): 150 mg via ORAL
  Filled 2021-10-08 (×2): qty 1

## 2021-10-08 MED ORDER — ACETAMINOPHEN 500 MG PO TABS
1000.0000 mg | ORAL_TABLET | ORAL | Status: DC
  Filled 2021-10-08: qty 2

## 2021-10-08 MED ORDER — ENSURE MAX PROTEIN PO LIQD
2.0000 [oz_av] | ORAL | Status: DC
  Administered 2021-10-09 – 2021-10-10 (×10): 2 [oz_av] via ORAL

## 2021-10-08 MED ORDER — BUPIVACAINE-EPINEPHRINE (PF) 0.25% -1:200000 IJ SOLN
INTRAMUSCULAR | Status: AC
  Filled 2021-10-08: qty 30

## 2021-10-08 MED ORDER — DOCUSATE SODIUM 100 MG PO CAPS
100.0000 mg | ORAL_CAPSULE | Freq: Two times a day (BID) | ORAL | Status: DC
  Administered 2021-10-08 – 2021-10-10 (×4): 100 mg via ORAL
  Filled 2021-10-08 (×4): qty 1

## 2021-10-08 MED ORDER — ACETAMINOPHEN 500 MG PO TABS
1000.0000 mg | ORAL_TABLET | Freq: Three times a day (TID) | ORAL | Status: DC
  Administered 2021-10-08 – 2021-10-10 (×6): 1000 mg via ORAL
  Filled 2021-10-08 (×6): qty 2

## 2021-10-08 MED ORDER — OXYCODONE HCL 5 MG/5ML PO SOLN: 5.0000 mg | Freq: Four times a day (QID) | ORAL | Status: DC | PRN

## 2021-10-08 MED ORDER — ONDANSETRON HCL 4 MG/2ML IJ SOLN
INTRAMUSCULAR | Status: AC
  Filled 2021-10-08: qty 2

## 2021-10-08 MED ORDER — ROCURONIUM BROMIDE 10 MG/ML (PF) SYRINGE
PREFILLED_SYRINGE | INTRAVENOUS | Status: DC | PRN
  Administered 2021-10-08: 80 mg via INTRAVENOUS
  Administered 2021-10-08: 20 mg via INTRAVENOUS

## 2021-10-08 MED ORDER — KETAMINE HCL 10 MG/ML IJ SOLN
INTRAMUSCULAR | Status: DC | PRN
  Administered 2021-10-08 (×2): 20 mg via INTRAVENOUS

## 2021-10-08 MED ORDER — CHLORHEXIDINE GLUCONATE 0.12 % MT SOLN
15.0000 mL | Freq: Once | OROMUCOSAL | Status: AC
  Administered 2021-10-08: 15 mL via OROMUCOSAL

## 2021-10-08 MED ORDER — SUGAMMADEX SODIUM 500 MG/5ML IV SOLN
INTRAVENOUS | Status: AC
  Filled 2021-10-08: qty 5

## 2021-10-08 MED ORDER — SODIUM CHLORIDE 0.9 % IV SOLN: INTRAVENOUS | Status: DC

## 2021-10-08 MED ORDER — PROPOFOL 10 MG/ML IV BOLUS
INTRAVENOUS | Status: DC | PRN
  Administered 2021-10-08: 200 mg via INTRAVENOUS

## 2021-10-08 MED ORDER — KETAMINE HCL-SODIUM CHLORIDE 100-0.9 MG/10ML-% IV SOSY
PREFILLED_SYRINGE | INTRAVENOUS | Status: AC
  Filled 2021-10-08: qty 10

## 2021-10-08 MED ORDER — PANTOPRAZOLE SODIUM 40 MG IV SOLR
40.0000 mg | Freq: Every day | INTRAVENOUS | Status: DC
  Administered 2021-10-08 – 2021-10-09 (×2): 40 mg via INTRAVENOUS
  Filled 2021-10-08 (×2): qty 40

## 2021-10-08 MED ORDER — FENTANYL CITRATE PF 50 MCG/ML IJ SOSY
25.0000 ug | PREFILLED_SYRINGE | INTRAMUSCULAR | Status: DC | PRN
  Administered 2021-10-08: 50 ug via INTRAVENOUS
  Administered 2021-10-08: 25 ug via INTRAVENOUS
  Administered 2021-10-08: 50 ug via INTRAVENOUS

## 2021-10-08 MED ORDER — AMPHETAMINE-DEXTROAMPHET ER 5 MG PO CP24
15.0000 mg | ORAL_CAPSULE | Freq: Two times a day (BID) | ORAL | Status: DC
  Administered 2021-10-09 – 2021-10-10 (×2): 15 mg via ORAL
  Filled 2021-10-08 (×2): qty 3

## 2021-10-08 MED ORDER — LIDOCAINE 2% (20 MG/ML) 5 ML SYRINGE
INTRAMUSCULAR | Status: DC | PRN
  Administered 2021-10-08: 1.5 mg/kg/h via INTRAVENOUS

## 2021-10-08 MED ORDER — LIDOCAINE HCL (PF) 2 % IJ SOLN
INTRAMUSCULAR | Status: AC
  Filled 2021-10-08: qty 5

## 2021-10-08 MED ORDER — ROCURONIUM BROMIDE 10 MG/ML (PF) SYRINGE
PREFILLED_SYRINGE | INTRAVENOUS | Status: AC
  Filled 2021-10-08: qty 10

## 2021-10-08 MED ORDER — DEXAMETHASONE SODIUM PHOSPHATE 10 MG/ML IJ SOLN
INTRAMUSCULAR | Status: AC
  Filled 2021-10-08: qty 1

## 2021-10-08 MED ORDER — DEXAMETHASONE SODIUM PHOSPHATE 10 MG/ML IJ SOLN
INTRAMUSCULAR | Status: DC | PRN
  Administered 2021-10-08: 6 mg via INTRAVENOUS

## 2021-10-08 MED ORDER — HYDROMORPHONE HCL 1 MG/ML IJ SOLN
INTRAMUSCULAR | Status: AC
  Administered 2021-10-08: 0.25 mg via INTRAVENOUS
  Filled 2021-10-08: qty 2

## 2021-10-08 MED ORDER — CHLORHEXIDINE GLUCONATE CLOTH 2 % EX PADS: 6.0000 | MEDICATED_PAD | Freq: Once | CUTANEOUS | Status: DC

## 2021-10-08 MED ORDER — BUPIVACAINE LIPOSOME 1.3 % IJ SUSP
INTRAMUSCULAR | Status: DC | PRN
  Administered 2021-10-08: 20 mL

## 2021-10-08 MED ORDER — PROPOFOL 10 MG/ML IV BOLUS
INTRAVENOUS | Status: AC
  Filled 2021-10-08: qty 20

## 2021-10-08 MED ORDER — ONDANSETRON HCL 4 MG/2ML IJ SOLN
INTRAMUSCULAR | Status: DC | PRN
  Administered 2021-10-08: 4 mg via INTRAVENOUS

## 2021-10-08 MED ORDER — MIDAZOLAM HCL 5 MG/5ML IJ SOLN
INTRAMUSCULAR | Status: DC | PRN
  Administered 2021-10-08: 2 mg via INTRAVENOUS

## 2021-10-08 MED ORDER — HYDROMORPHONE HCL 1 MG/ML IJ SOLN: 0.5000 mg | INTRAMUSCULAR | Status: DC | PRN

## 2021-10-08 MED ORDER — FENTANYL CITRATE PF 50 MCG/ML IJ SOSY
PREFILLED_SYRINGE | INTRAMUSCULAR | Status: AC
  Filled 2021-10-08: qty 1

## 2021-10-08 MED ORDER — LACTATED RINGERS IV SOLN: INTRAVENOUS | Status: DC

## 2021-10-08 MED ORDER — SIMETHICONE 80 MG PO CHEW
80.0000 mg | CHEWABLE_TABLET | Freq: Four times a day (QID) | ORAL | Status: DC | PRN
  Administered 2021-10-08 – 2021-10-09 (×2): 80 mg via ORAL
  Filled 2021-10-08 (×3): qty 1

## 2021-10-08 MED ORDER — GABAPENTIN 400 MG PO CAPS
400.0000 mg | ORAL_CAPSULE | Freq: Three times a day (TID) | ORAL | Status: DC
  Administered 2021-10-08 – 2021-10-10 (×5): 400 mg via ORAL
  Filled 2021-10-08 (×5): qty 1

## 2021-10-08 MED ORDER — BUPIVACAINE-EPINEPHRINE 0.25% -1:200000 IJ SOLN
INTRAMUSCULAR | Status: DC | PRN
  Administered 2021-10-08: 30 mL

## 2021-10-08 MED ORDER — MIDAZOLAM HCL 2 MG/2ML IJ SOLN
INTRAMUSCULAR | Status: AC
  Filled 2021-10-08: qty 2

## 2021-10-08 MED ORDER — PROMETHAZINE HCL 25 MG/ML IJ SOLN: 6.2500 mg | INTRAMUSCULAR | Status: DC | PRN

## 2021-10-08 MED ORDER — SUGAMMADEX SODIUM 500 MG/5ML IV SOLN
INTRAVENOUS | Status: DC | PRN
  Administered 2021-10-08: 400 mg via INTRAVENOUS

## 2021-10-08 MED ORDER — APREPITANT 40 MG PO CAPS
40.0000 mg | ORAL_CAPSULE | ORAL | Status: AC
  Administered 2021-10-08: 40 mg via ORAL
  Filled 2021-10-08: qty 1

## 2021-10-08 MED ORDER — LIDOCAINE 2% (20 MG/ML) 5 ML SYRINGE
INTRAMUSCULAR | Status: DC | PRN
Start: 2021-10-08 — End: 2021-10-08
  Administered 2021-10-08: 100 mg via INTRAVENOUS

## 2021-10-08 MED ORDER — FENTANYL CITRATE PF 50 MCG/ML IJ SOSY
PREFILLED_SYRINGE | INTRAMUSCULAR | Status: AC
  Administered 2021-10-08: 25 ug via INTRAVENOUS
  Filled 2021-10-08: qty 2

## 2021-10-08 MED ORDER — METOPROLOL TARTRATE 12.5 MG HALF TABLET
12.5000 mg | ORAL_TABLET | Freq: Two times a day (BID) | ORAL | Status: DC
  Administered 2021-10-08 – 2021-10-10 (×4): 12.5 mg via ORAL
  Filled 2021-10-08 (×4): qty 1

## 2021-10-08 MED ORDER — BUPIVACAINE LIPOSOME 1.3 % IJ SUSP: 20.0000 mL | Freq: Once | INTRAMUSCULAR | Status: DC

## 2021-10-08 MED ORDER — ENOXAPARIN SODIUM 30 MG/0.3ML IJ SOSY
30.0000 mg | PREFILLED_SYRINGE | Freq: Two times a day (BID) | INTRAMUSCULAR | Status: DC
  Administered 2021-10-09: 08:00:00 30 mg via SUBCUTANEOUS
  Filled 2021-10-08: qty 0.3

## 2021-10-08 MED ORDER — SODIUM CHLORIDE 0.9 % IV SOLN
2.0000 g | INTRAVENOUS | Status: AC
  Administered 2021-10-08: 2 g via INTRAVENOUS
  Filled 2021-10-08: qty 2

## 2021-10-08 MED ORDER — DULOXETINE HCL 60 MG PO CPEP
60.0000 mg | ORAL_CAPSULE | Freq: Every day | ORAL | Status: DC
  Administered 2021-10-09 – 2021-10-10 (×2): 60 mg via ORAL
  Filled 2021-10-08 (×2): qty 1

## 2021-10-08 MED ORDER — TRAMADOL HCL 50 MG PO TABS
50.0000 mg | ORAL_TABLET | Freq: Four times a day (QID) | ORAL | Status: DC | PRN
  Administered 2021-10-08: 50 mg via ORAL
  Filled 2021-10-08: qty 1

## 2021-10-08 SURGICAL SUPPLY — 78 items
APL PRP STRL LF DISP 70% ISPRP (MISCELLANEOUS) ×4
APL SKNCLS STERI-STRIP NONHPOA (GAUZE/BANDAGES/DRESSINGS) ×2
APPLIER CLIP ROT 10 11.4 M/L (STAPLE)
APPLIER CLIP ROT 13.4 12 LRG (CLIP) ×3
APR CLP LRG 13.4X12 ROT 20 MLT (CLIP) ×2
APR CLP MED LRG 11.4X10 (STAPLE)
BAG COUNTER SPONGE SURGICOUNT (BAG) IMPLANT
BAG LAPAROSCOPIC 12 15 PORT 16 (BASKET) IMPLANT
BAG RETRIEVAL 12/15 (BASKET)
BAG SPNG CNTER NS LX DISP (BAG)
BENZOIN TINCTURE PRP APPL 2/3 (GAUZE/BANDAGES/DRESSINGS) ×3 IMPLANT
BLADE SURG SZ11 CARB STEEL (BLADE) ×3 IMPLANT
BNDG ADH 1X3 SHEER STRL LF (GAUZE/BANDAGES/DRESSINGS) ×18 IMPLANT
BNDG ADH THN 3X1 STRL LF (GAUZE/BANDAGES/DRESSINGS) ×12
CABLE HIGH FREQUENCY MONO STRZ (ELECTRODE) ×3 IMPLANT
CHLORAPREP W/TINT 26 (MISCELLANEOUS) ×6 IMPLANT
CLIP APPLIE ROT 10 11.4 M/L (STAPLE) IMPLANT
CLIP APPLIE ROT 13.4 12 LRG (CLIP) IMPLANT
CLSR STERI-STRIP ANTIMIC 1/2X4 (GAUZE/BANDAGES/DRESSINGS) ×1 IMPLANT
COVER SURGICAL LIGHT HANDLE (MISCELLANEOUS) ×3 IMPLANT
DECANTER SPIKE VIAL GLASS SM (MISCELLANEOUS) ×2 IMPLANT
DEVICE SUT QUICK LOAD TK 5 (STAPLE) ×1 IMPLANT
DEVICE SUT TI-KNOT TK 5X26 (MISCELLANEOUS) ×1 IMPLANT
DRAPE UTILITY XL STRL (DRAPES) ×6 IMPLANT
ELECT REM PT RETURN 15FT ADLT (MISCELLANEOUS) ×3 IMPLANT
GAUZE SPONGE 4X4 12PLY STRL (GAUZE/BANDAGES/DRESSINGS) IMPLANT
GLOVE SURG ENC MOIS LTX SZ6 (GLOVE) ×3 IMPLANT
GLOVE SURG MICRO LTX SZ6 (GLOVE) ×3 IMPLANT
GLOVE SURG UNDER LTX SZ6.5 (GLOVE) ×3 IMPLANT
GOWN STRL REUS W/TWL LRG LVL3 (GOWN DISPOSABLE) ×3 IMPLANT
GOWN STRL REUS W/TWL XL LVL3 (GOWN DISPOSABLE) ×6 IMPLANT
GRASPER SUT TROCAR 14GX15 (MISCELLANEOUS) ×3 IMPLANT
IRRIG SUCT STRYKERFLOW 2 WTIP (MISCELLANEOUS) ×3
IRRIGATION SUCT STRKRFLW 2 WTP (MISCELLANEOUS) ×2 IMPLANT
KIT BASIN OR (CUSTOM PROCEDURE TRAY) ×3 IMPLANT
KIT TURNOVER KIT A (KITS) IMPLANT
MARKER SKIN DUAL TIP RULER LAB (MISCELLANEOUS) ×3 IMPLANT
MAT PREVALON FULL STRYKER (MISCELLANEOUS) ×3 IMPLANT
NDL SPNL 22GX3.5 QUINCKE BK (NEEDLE) ×2 IMPLANT
NEEDLE SPNL 22GX3.5 QUINCKE BK (NEEDLE) ×3 IMPLANT
NS IRRIG 1000ML POUR BTL (IV SOLUTION) ×1 IMPLANT
PACK UNIVERSAL I (CUSTOM PROCEDURE TRAY) ×3 IMPLANT
RELOAD ENDO STITCH (ENDOMECHANICALS) ×3 IMPLANT
RELOAD STAPLE 60 3.6 BLU REG (STAPLE) ×2 IMPLANT
RELOAD STAPLE 60 3.8 GOLD REG (STAPLE) ×2 IMPLANT
RELOAD STAPLE 60 4.1 GRN THCK (STAPLE) IMPLANT
RELOAD STAPLER BLUE 60MM (STAPLE) ×10 IMPLANT
RELOAD STAPLER GOLD 60MM (STAPLE) ×2 IMPLANT
RELOAD STAPLER GREEN 60MM (STAPLE) IMPLANT
RELOAD SUT TRIPLE-STITCH 2-0 (ENDOMECHANICALS) IMPLANT
SCISSORS LAP 5X45 EPIX DISP (ENDOMECHANICALS) ×3 IMPLANT
SET TUBE SMOKE EVAC HIGH FLOW (TUBING) ×3 IMPLANT
SHEARS HARMONIC ACE PLUS 45CM (MISCELLANEOUS) ×3 IMPLANT
SLEEVE ADV FIXATION 5X100MM (TROCAR) ×6 IMPLANT
SLEEVE GASTRECTOMY 40FR VISIGI (MISCELLANEOUS) ×3 IMPLANT
SOL ANTI FOG 6CC (MISCELLANEOUS) ×2 IMPLANT
SOLUTION ANTI FOG 6CC (MISCELLANEOUS) ×1
SPONGE T-LAP 18X18 ~~LOC~~+RFID (SPONGE) ×3 IMPLANT
STAPLE LINE REINFORCEMENT LAP (STAPLE) ×5 IMPLANT
STAPLER ECHELON LONG 60 440 (INSTRUMENTS) ×3 IMPLANT
STAPLER RELOAD BLUE 60MM (STAPLE) ×15
STAPLER RELOAD GOLD 60MM (STAPLE) ×3
STAPLER RELOAD GREEN 60MM (STAPLE)
STRIP CLOSURE SKIN 1/2X4 (GAUZE/BANDAGES/DRESSINGS) ×3 IMPLANT
SUT MNCRL AB 4-0 PS2 18 (SUTURE) ×3 IMPLANT
SUT SURGIDAC NAB ES-9 0 48 120 (SUTURE) ×1 IMPLANT
SUT VICRYL 0 TIES 12 18 (SUTURE) ×3 IMPLANT
SYR 10ML ECCENTRIC (SYRINGE) ×3 IMPLANT
SYR 20ML LL LF (SYRINGE) ×3 IMPLANT
SYR 50ML LL SCALE MARK (SYRINGE) ×3 IMPLANT
TOWEL OR 17X26 10 PK STRL BLUE (TOWEL DISPOSABLE) ×3 IMPLANT
TOWEL OR NON WOVEN STRL DISP B (DISPOSABLE) ×3 IMPLANT
TROCAR ADV FIXATION 5X100MM (TROCAR) ×3 IMPLANT
TROCAR BLADELESS 15MM (ENDOMECHANICALS) ×3 IMPLANT
TROCAR BLADELESS OPT 5 100 (ENDOMECHANICALS) ×3 IMPLANT
TUBING CONNECTING 10 (TUBING) ×3 IMPLANT
TUBING ENDO SMARTCAP (MISCELLANEOUS) ×3 IMPLANT
WATER STERILE IRR 1000ML POUR (IV SOLUTION) ×1 IMPLANT

## 2021-10-08 NOTE — Anesthesia Postprocedure Evaluation (Signed)
Anesthesia Post Note  Patient: [name]  Procedure(s) Performed: LAPAROSCOPIC GASTRIC SLEEVE RESECTION (Abdomen) UPPER GI ENDOSCOPY HERNIA REPAIR HIATAL     Patient location during evaluation: PACU Anesthesia Type: General Level of consciousness: sedated and awake Pain management: pain level controlled Vital Signs Assessment: post-procedure vital signs reviewed and stable Respiratory status: spontaneous breathing and respiratory function stable Cardiovascular status: stable Postop Assessment: no apparent nausea or vomiting Anesthetic complications: no   No notable events documented.  Last Vitals:  Vitals:   10/08/21 1311 10/08/21 1318  BP:    Pulse: 64 70  Resp: 13   Temp:    SpO2: 97% 94%    Last Pain:  Vitals:   10/08/21 1318  TempSrc:   PainSc: 8                  Candra R Dudley Cooley

## 2021-10-08 NOTE — Interval H&P Note (Signed)
History and Physical Interval Note:  10/08/2021 10:41 AM  [place]  has presented today for surgery, with the diagnosis of MORBID OBESITY.  The various methods of treatment have been discussed with the patient and family. After consideration of risks, benefits and other options for treatment, the patient has consented to  Procedure(s): LAPAROSCOPIC GASTRIC SLEEVE RESECTION (N/A) UPPER GI ENDOSCOPY (N/A) HERNIA REPAIR HIATAL (N/A) as a surgical intervention.  The patient's history has been reviewed, patient examined, no change in status, stable for surgery.  I have reviewed the patient's chart and labs.  Questions were answered to the patient's satisfaction.     Ahnaf Caponi Lollie Sails

## 2021-10-08 NOTE — Progress Notes (Signed)
Discussed QI "Goals for Discharge" document with patient including ambulation in halls (Discussed with pt importance of ambulance due to hx of DVT), Incentive Spirometry use every hour, and oral care.  Also discussed pain and nausea control.  Enabled or verified head of bed 30 degree alarm activated.  BSTOP education provided including BSTOP information guide, "Guide for Pain Management after your Bariatric Procedure".  Diet progression education provided including "Bariatric Surgery Post-Op Food Plan Phase 1: Liquids".  Questions answered.  Will continue to partner with bedside RN and follow up with patient per protocol.

## 2021-10-08 NOTE — Op Note (Signed)
Preoperative diagnosis: laparoscopic sleeve gastrectomy  Postoperative diagnosis: Same   Procedure: Upper endoscopy   Surgeon: Oluwatobi Visser, M.D.  Anesthesia: Gen.   Indications for procedure: This patient was undergoing a laparoscopic sleeve gastrectomy.   Description of procedure: The endoscopy was placed in the mouth and into the oropharynx and under endoscopic vision it was advanced to the esophagogastric junction.  The stomach was insufflated and no bleeding or bubbles were seen.  The GEJ was identified at 39cm from the teeth. No bleeding or leaks were detected. The scope was withdrawn without difficulty.    Ron Beske, M.D. General, Bariatric, & Minimally Invasive [facility]   

## 2021-10-08 NOTE — Transfer of Care (Signed)
Immediate Anesthesia Transfer of Care Note  Patient: [name]  Procedure(s) Performed: LAPAROSCOPIC GASTRIC SLEEVE RESECTION (Abdomen) UPPER GI ENDOSCOPY HERNIA REPAIR HIATAL  Patient Location: PACU  Anesthesia Type:General  Level of Consciousness: awake, alert  and oriented  Airway & Oxygen Therapy: Patient Spontanous Breathing and Patient connected to face mask oxygen  Post-op Assessment: Report given to RN and Post -op Vital signs reviewed and stable  Post vital signs: Reviewed and stable  Last Vitals:  Vitals Value Taken Time  BP 149/94 10/08/21 1254  Temp    Pulse 84 10/08/21 1256  Resp 22 10/08/21 1256  SpO2 99 % 10/08/21 1256  Vitals shown include unvalidated device data.  Last Pain:  Vitals:   10/08/21 1015  TempSrc:   PainSc: 0-No pain         Complications: No notable events documented.

## 2021-10-08 NOTE — Op Note (Signed)
Operative Note  [name]  [id number]:[id number]  [id number]  10/08/2021   Surgeon: Romana Juniper MD FACS   Assistant: Gurney Maxin MD FACS   Procedure performed: laparoscopic sleeve gastrectomy, hiatal hernia repair, upper endoscopy   Preop diagnosis: Morbid obesity Body mass index is 59.04 kg/m. Post-op diagnosis/intraop findings: same   Specimens: fundus Retained items: none  EBL: minimal  Complications: none   Description of procedure: After obtaining informed consent and administration of chemical DVT prophylaxis in holding, the patient was taken to the operating room and placed supine on operating room table where general endotracheal anesthesia was initiated, preoperative antibiotics were administered, SCDs applied, and a formal timeout was performed. The abdomen was prepped and draped in usual sterile fashion. Peritoneal access was gained using a Visiport technique in the left upper quadrant and insufflation to 15 mmHg ensued without issue. Gross inspection revealed no evidence of injury. Under direct visualization three more 5 mm trochars were placed in the right and left hemiabdomen and the 33mm trocar in the right paramedian upper abdomen. Bilateral laparoscopic assisted TAPS blocks were performed with Exparel diluted with 0.25 percent Marcaine with epinephrine. The patient was placed in steep Trendelenburg and the liver retractor was introduced through an incision in the upper midline and secured to the post externally to maintain the left lobe retracted anteriorly.  A small hiatal hernia was visible confirming the upper GI findings. The pars lucida was entered with Harmonic scalpel and the posterior aspect of the right and left crus were dissected out using Harmonic and blunt dissection. The hiatus was narrowed with a single figure of eight suture of 0 Ethibond secured with the ty-knot device.  Using the Harmonic scalpel, the greater curvature of the stomach was dissected away  from the greater omentum and short gastric vessels were divided. This began 6 cm from the pylorus, and dissection proceeded until the left crus was clearly exposed. There were some filmy adhesions of the posterior stomach to the pancreas which were divided with the Harmonic. Esophageal fat pad was mobilized off the anterior stomach slightly. The 2 [place] VisiGi was then introduced and directed down towards the pylorus. This was placed to suction against the lesser curve. Serial fires of the linear cutting stapler with reinforcements were then employed to create our sleeve. The first fire used a gold load and ensured adequate room at the angularis incisura. Several (5) blue loads were then employed to create a narrow tubular stomach up to the angle of His. The excised stomach was then removed through our 15 mm trocar site within an Endo Catch bag.  The visigi was taken off of suction and a few puffs of air were introduced, inflating the sleeve. No bubbles were observed in the irrigation fluid around the stomach and the shape was noted to be evenly tubular without any narrowing at the angularis. The visigi was then removed. Upper endoscopy was performed by the assistant surgeon and the sleeve was noted to be airtight, the staple line was hemostatic. Please see his separate note. The endoscope was removed. A small amount of oozing on the staple line (the most distal and the most proximal) was addressed with clips. The 15 mm trocar site fascia in the right upper abdomen was closed with a 0 Vicryl using the laparoscopic suture passer under direct visualization. The liver retractor was removed under direct visualization. The abdomen was then desufflated and all remaining trochars removed. The skin incisions were closed with subcuticular Monocryl; benzoin, Steri-Strips and  Band-Aids were applied The patient was then awakened, extubated and taken to PACU in stable condition.     All counts were correct at the completion  of the case.

## 2021-10-08 NOTE — Progress Notes (Addendum)
PHARMACY CONSULT FOR:  Risk Assessment for Post-Discharge VTE Following Bariatric Surgery  Post-Discharge VTE Risk Assessment: This patient's probability of 30-day post-discharge VTE is increased due to the factors marked:   Female   X Age >/=60 years  X  BMI >/=50 kg/m2    CHF    Dyspnea at Rest    Paraplegia   X Non-gastric-band surgery    Operation Time >/=3 hr    Return to OR     Length of Stay >/= 3 d  X Hx of VTE   Hypercoagulable condition   Significant venous stasis       Predicted probability of 30-day post-discharge VTE: Patient is at high risk since she has a hx DVT and was on xarelto PTA (see recom below)   Recommendation for Discharge: Resume full-dose anticoagulation as soon as medically safe per surgeon assessment.    Given limited published data on reliability of DOAC absorption in this setting, will await MD decision on whether to use enoxaparin vs resume DOAC.     [name] is a 65 y.o. female who underwent  laparoscopic sleeve gastrectomy and hiatal hernia repair on 10/08/21.   Case start: 1145 Case end: 1244   Allergies  Allergen Reactions   Amlodipine Besylate Swelling    Other reaction(s): edema at 10 mg dose   Codeine Hives   Triamcinolone Nausea Only   Latex Itching and Rash    Patient Measurements: Height: 5\' 2"  (157.5 cm) Weight: (!) 146.4 kg (322 lb 12.8 oz) IBW/kg (Calculated) : 50.1 Body mass index is 59.04 kg/m.  No results for input(s): WBC, HGB, HCT, PLT, APTT, CREATININE, LABCREA, CREATININE, CREAT24HRUR, MG, PHOS, ALBUMIN, PROT, ALBUMIN, AST, ALT, ALKPHOS, BILITOT, BILIDIR, IBILI in the last 72 hours. Estimated Creatinine Clearance: 73.9 mL/min (A) (by C-G formula based on SCr of 1.09 mg/dL (H)).    Past Medical History:  Diagnosis Date   Arthritis    Atrial fibrillation (HCC)    Chronic lower back pain    Depression    DVT (deep venous thrombosis) (HCC) 1990s   LLE   Dyspnea    Fallen arches    Family  history of adverse reaction to anesthesia    Mother had rash and itching after anesthesia   Headache    Hypertension    Hyperthyroidism ~ 2000   "fine now" (04/23/2016)   Joint pain    Lactose intolerance    Leg edema    Obesity    Pneumonia 1980s X 1   Snoring    has not had sleep study but suspects sleep apnea     Medications Prior to Admission  Medication Sig Dispense Refill Last Dose   acetaminophen (TYLENOL) 325 MG tablet Take 650 mg by mouth every 4 (four) hours as needed for moderate pain.   10/08/2021 at 0700   amphetamine-dextroamphetamine (ADDERALL XR) 15 MG 24 hr capsule Take 15 mg by mouth 2 (two) times daily.   10/07/2021   Ascorbic Acid (VITAMIN C GUMMIE PO) Take 2 tablets by mouth daily.   Past Month   buPROPion (WELLBUTRIN XL) 150 MG 24 hr tablet Take 150 mg by mouth daily.   10/08/2021   DULoxetine (CYMBALTA) 30 MG capsule Take 60 mg by mouth daily.   10/08/2021   gabapentin (NEURONTIN) 400 MG capsule Take 400 mg by mouth 3 (three) times daily.   10/08/2021   metoprolol tartrate (LOPRESSOR) 25 MG tablet TAKE 1/2 TABLET(12.5 MG TOTAL) BY MOUTH TWICE DAILY 90 tablet  3 10/08/2021   triamcinolone ointment (KENALOG) 0.1 % Apply 1 application topically daily as needed (rash).   Past Week   triamterene-hydrochlorothiazide (MAXZIDE-25) 37.5-25 MG tablet Take 1 tablet by mouth every morning.    10/07/2021   albuterol (VENTOLIN HFA) 108 (90 Base) MCG/ACT inhaler SMARTSIG:2 Puff(s) By Mouth Every 4 Hours PRN   More than a month   buPROPion (WELLBUTRIN SR) 200 MG 12 hr tablet Take 1 tablet (200 mg total) by mouth daily. 30 tablet 0    XARELTO 20 MG TABS tablet TAKE 1 TABLET(20 MG) BY MOUTH DAILY WITH SUPPER 90 tablet 1 10/05/2021 at [place], [name] P 10/08/2021,4:40 PM

## 2021-10-08 NOTE — Anesthesia Procedure Notes (Signed)
Procedure Name: Intubation Date/Time: 10/08/2021 11:30 AM Performed by: Ezekiel Ina, CRNA Pre-anesthesia Checklist: Patient identified, Emergency Drugs available, Suction available and Patient being monitored Patient Re-evaluated:Patient Re-evaluated prior to induction Oxygen Delivery Method: Circle system utilized Preoxygenation: Pre-oxygenation with 100% oxygen Induction Type: IV induction Ventilation: Mask ventilation without difficulty Laryngoscope Size: Miller and 2 Grade View: Grade I Tube type: Oral Tube size: 7.5 mm Number of attempts: 1 Airway Equipment and Method: Stylet and Oral airway Placement Confirmation: ETT inserted through vocal cords under direct vision, positive ETCO2 and breath sounds checked- equal and bilateral Secured at: 21 cm Tube secured with: Tape Dental Injury: Teeth and Oropharynx as per pre-operative assessment

## 2021-10-09 ENCOUNTER — Other Ambulatory Visit (HOSPITAL_COMMUNITY): Payer: Self-pay

## 2021-10-09 ENCOUNTER — Encounter (HOSPITAL_COMMUNITY): Payer: Self-pay | Admitting: Surgery

## 2021-10-09 LAB — CBC WITH DIFFERENTIAL/PLATELET
Abs Immature Granulocytes: 0.05 10*3/uL (ref 0.00–0.07)
Basophils Absolute: 0 10*3/uL (ref 0.0–0.1)
Basophils Relative: 0 %
Eosinophils Absolute: 0 10*3/uL (ref 0.0–0.5)
Eosinophils Relative: 0 %
HCT: 37.9 % (ref 36.0–46.0)
Hemoglobin: 11.9 g/dL — ABNORMAL LOW (ref 12.0–15.0)
Immature Granulocytes: 1 %
Lymphocytes Relative: 9 %
Lymphs Abs: 0.7 10*3/uL (ref 0.7–4.0)
MCH: 26 pg (ref 26.0–34.0)
MCHC: 31.4 g/dL (ref 30.0–36.0)
MCV: 82.8 fL (ref 80.0–100.0)
Monocytes Absolute: 0.3 10*3/uL (ref 0.1–1.0)
Monocytes Relative: 4 %
Neutro Abs: 6.4 10*3/uL (ref 1.7–7.7)
Neutrophils Relative %: 86 %
Platelets: 233 10*3/uL (ref 150–400)
RBC: 4.58 MIL/uL (ref 3.87–5.11)
RDW: 15.9 % — ABNORMAL HIGH (ref 11.5–15.5)
WBC: 7.4 10*3/uL (ref 4.0–10.5)
nRBC: 0 % (ref 0.0–0.2)

## 2021-10-09 LAB — SURGICAL PATHOLOGY

## 2021-10-09 LAB — COMPREHENSIVE METABOLIC PANEL
ALT: 15 U/L (ref 0–44)
AST: 19 U/L (ref 15–41)
Albumin: 3.3 g/dL — ABNORMAL LOW (ref 3.5–5.0)
Alkaline Phosphatase: 40 U/L (ref 38–126)
Anion gap: 8 (ref 5–15)
BUN: 19 mg/dL (ref 8–23)
CO2: 25 mmol/L (ref 22–32)
Calcium: 8.5 mg/dL — ABNORMAL LOW (ref 8.9–10.3)
Chloride: 103 mmol/L (ref 98–111)
Creatinine, Ser: 0.93 mg/dL (ref 0.44–1.00)
GFR, Estimated: >60 mL/min (ref >60)
Glucose, Bld: 104 mg/dL — ABNORMAL HIGH (ref 70–99)
Potassium: 4.4 mmol/L (ref 3.5–5.1)
Sodium: 136 mmol/L (ref 135–145)
Total Bilirubin: 0.7 mg/dL (ref 0.3–1.2)
Total Protein: 6.2 g/dL — ABNORMAL LOW (ref 6.5–8.1)

## 2021-10-09 LAB — MAGNESIUM: Magnesium: 1.8 mg/dL (ref 1.7–2.4)

## 2021-10-09 MED ORDER — MAGNESIUM SULFATE 2 GM/50ML IV SOLN
2.0000 g | Freq: Once | INTRAVENOUS | Status: AC
  Administered 2021-10-09: 07:00:00 2 g via INTRAVENOUS
  Filled 2021-10-09: qty 50

## 2021-10-09 MED ORDER — ENOXAPARIN SODIUM 150 MG/ML IJ SOSY
150.0000 mg | PREFILLED_SYRINGE | Freq: Two times a day (BID) | INTRAMUSCULAR | Status: DC
  Administered 2021-10-09 – 2021-10-10 (×2): 150 mg via SUBCUTANEOUS
  Filled 2021-10-09 (×2): qty 1

## 2021-10-09 MED ORDER — ENOXAPARIN (LOVENOX) PATIENT EDUCATION KIT
PACK | Freq: Once | Status: AC
  Filled 2021-10-09: qty 1

## 2021-10-09 NOTE — Discharge Instructions (Signed)

## 2021-10-09 NOTE — Progress Notes (Signed)
S: Feeling well this morning. Slept well. Pain and nausea controlled. Has been walking. Tolerating PO liquids, no nausea or dysphagia. Burning with magnesium IV.  O: Vitals, labs, intake/output, and orders reviewed at this time. Afebrile, no tachycardia. Normotensive. 94% RA. PO 420, UOP 4x. CMP unremarkable. WBC 7.4, hgb 11.9 (13.9 preop)   Gen: A&Ox3, no distress  H&N: EOMI, atraumatic, neck supple Chest: unlabored respirations, RRR Abd: soft, nontender, nondistended, incision(s) c/d/i No cellulitis or hematoma Ext: warm, no edema Neuro: grossly normal  Lines/tubes/drains: PIV  A/P: POD 1 sleeve gastrectomy -Continue liquids -Continue ambulating, pulm toilet, SCDs.  -Will transition to therapeutic lovenox this evening. Plan DC tomorrow if tolerating well.    Phylliss Blakes, MD [facility], [place]

## 2021-10-09 NOTE — Progress Notes (Signed)
Patient alert and oriented, pain is controlled. Patient is tolerating fluids, advanced to protein shake today, patient is tolerating well.  Reviewed Gastric sleeve discharge instructions with patient and patient is able to articulate understanding.  Provided information on BELT program, Support Group and [facility] outpatient pharmacy. All questions answered, will continue to monitor.  Reviewed Lovenox teaching kit and pt reviewed Lovenox "Patient-Self Injection Video".   

## 2021-10-09 NOTE — Progress Notes (Signed)
ANTICOAGULATION CONSULT NOTE - Initial Consult  Pharmacy Consult for Lovenox while Xarelto on hold Indication: Hx DVT  Allergies  Allergen Reactions   Amlodipine Besylate Swelling    Other reaction(s): edema at 10 mg dose   Codeine Hives   Triamcinolone Nausea Only   Latex Itching and Rash    Patient Measurements: Height: 5\' 2"  (157.5 cm) Weight: (!) 146.4 kg (322 lb 12.8 oz) IBW/kg (Calculated) : 50.1  Vital Signs: Temp: 97.6 F (36.4 C) (12/14 0947) Temp Source: Oral (12/14 0947) BP: 113/67 (12/14 0947) Pulse Rate: 109 (12/14 0947)  Labs: Recent Labs    10/09/21 0419  HGB 11.9*  HCT 37.9  PLT 233  CREATININE 0.93    Estimated Creatinine Clearance: 86.6 mL/min (by C-G formula based on SCr of 0.93 mg/dL).   Medical History: Past Medical History:  Diagnosis Date   Arthritis    Atrial fibrillation (HCC)    Chronic lower back pain    Depression    DVT (deep venous thrombosis) (HCC) 1990s   LLE   Dyspnea    Fallen arches    Family history of adverse reaction to anesthesia    Mother had rash and itching after anesthesia   Headache    Hypertension    Hyperthyroidism ~ 2000   "fine now" (04/23/2016)   Joint pain    Lactose intolerance    Leg edema    Obesity    Pneumonia 1980s X 1   Snoring    has not had sleep study but suspects sleep apnea    Medications:  Scheduled:   acetaminophen  1,000 mg Oral Q8H   Or   acetaminophen (TYLENOL) oral liquid 160 mg/5 mL  1,000 mg Oral Q8H   amphetamine-dextroamphetamine  15 mg Oral BID WC   buPROPion  150 mg Oral Daily   docusate sodium  100 mg Oral BID   DULoxetine  60 mg Oral Daily   enoxaparin (LOVENOX) injection  150 mg Subcutaneous Q12H   gabapentin  400 mg Oral TID   metoCLOPramide (REGLAN) injection  10 mg Intravenous Q6H   metoprolol tartrate  12.5 mg Oral BID   pantoprazole (PROTONIX) IV  40 mg Intravenous QHS   Ensure Max Protein  2 oz Oral Q2H   Infusions:   sodium chloride 125 mL/hr at 10/08/21  2053   methocarbamol (ROBAXIN) IV 500 mg (10/08/21 1606)   PRN: albuterol, hydrALAZINE, HYDROmorphone (DILAUDID) injection, methocarbamol (ROBAXIN) IV, metoprolol tartrate, ondansetron (ZOFRAN) IV, oxyCODONE, simethicone, traMADol  Assessment: 65 yo female s/p sleeve gastrectomy 10/08/2021.  Patient takes chronic Xarelto for hx DVT.  Pharmacy consulted to transition to full-dose lovenox POD1 PM with plans for 4-weeks post-op before changing back to Xarelto therapy.  Hgb 11.9 (13.9 pre-op), Plts wnl SCr 0.93, CrCl ~86 ml/min Weight 146kg No bleeding reported  Goal of Therapy:  Anti-Xa level 0.6-1 units/ml 4hrs after LMWH dose given Monitor platelets by anticoagulation protocol: Yes   Plan:  Increase Lovenox to 150mg  SQ q12h x 4 weeks, then plan to resume Xarelto per surgeon. Monitor closely for signs/symptoms of bleeding  10/10/2021, PharmD, BCPS Pharmacy: [phone] 10/09/2021,10:12 AM

## 2021-10-10 ENCOUNTER — Other Ambulatory Visit (HOSPITAL_COMMUNITY): Payer: Self-pay

## 2021-10-10 LAB — CBC WITH DIFFERENTIAL/PLATELET
Abs Immature Granulocytes: 0.05 10*3/uL (ref 0.00–0.07)
Basophils Absolute: 0 10*3/uL (ref 0.0–0.1)
Basophils Relative: 0 %
Eosinophils Absolute: 0.1 10*3/uL (ref 0.0–0.5)
Eosinophils Relative: 1 %
HCT: 35.4 % — ABNORMAL LOW (ref 36.0–46.0)
Hemoglobin: 11.2 g/dL — ABNORMAL LOW (ref 12.0–15.0)
Immature Granulocytes: 1 %
Lymphocytes Relative: 19 %
Lymphs Abs: 1.8 10*3/uL (ref 0.7–4.0)
MCH: 26.5 pg (ref 26.0–34.0)
MCHC: 31.6 g/dL (ref 30.0–36.0)
MCV: 83.9 fL (ref 80.0–100.0)
Monocytes Absolute: 0.9 10*3/uL (ref 0.1–1.0)
Monocytes Relative: 9 %
Neutro Abs: 6.8 10*3/uL (ref 1.7–7.7)
Neutrophils Relative %: 70 %
Platelets: 219 10*3/uL (ref 150–400)
RBC: 4.22 MIL/uL (ref 3.87–5.11)
RDW: 16.7 % — ABNORMAL HIGH (ref 11.5–15.5)
WBC: 9.7 10*3/uL (ref 4.0–10.5)
nRBC: 0 % (ref 0.0–0.2)

## 2021-10-10 MED ORDER — TRAMADOL HCL 50 MG PO TABS
50.0000 mg | ORAL_TABLET | Freq: Four times a day (QID) | ORAL | 0 refills | Status: DC | PRN
  Filled 2021-10-10: qty 10, 3d supply, fill #0

## 2021-10-10 MED ORDER — ENOXAPARIN SODIUM 150 MG/ML IJ SOSY
150.0000 mg | PREFILLED_SYRINGE | Freq: Two times a day (BID) | INTRAMUSCULAR | 0 refills | Status: DC
Start: 2021-10-10 — End: 2021-10-23
  Filled 2021-10-10: qty 47, 23d supply, fill #0
  Filled 2021-10-10: qty 9, 5d supply, fill #0

## 2021-10-10 MED ORDER — ONDANSETRON 4 MG PO TBDP
4.0000 mg | ORAL_TABLET | Freq: Four times a day (QID) | ORAL | 0 refills | Status: DC | PRN
  Filled 2021-10-10: qty 20, 5d supply, fill #0

## 2021-10-10 MED ORDER — ACETAMINOPHEN 500 MG PO TABS: 1000.0000 mg | ORAL_TABLET | Freq: Three times a day (TID) | ORAL | 0 refills | Status: AC

## 2021-10-10 MED ORDER — PANTOPRAZOLE SODIUM 40 MG PO TBEC
40.0000 mg | DELAYED_RELEASE_TABLET | Freq: Every day | ORAL | 0 refills | Status: AC
  Filled 2021-10-10: qty 90, 90d supply, fill #0

## 2021-10-10 NOTE — Discharge Summary (Signed)
Physician Discharge Summary  [name] NWG:[id number] DOB: [birth year] DOA: 10/08/2021  PCP: Harlan Stains, MD  Admit date: 10/08/2021 Discharge date: 10/10/2021   Recommendations for Outpatient Follow-up:    Follow-up Information     Clovis Riley, MD. Go on 11/06/2021.   Specialty: General Surgery Why: at 9:20am. Please arrive 15 minutes piror to your appointment time.  Thank you. Contact information: [address] [place] [address] [phone]         [facility] [place] [facility]. Go on 11/28/2021.   Specialty: General Surgery Why: at 9:20am with Dr. Kae Heller.  Please arrive 15 minutes prior to your appointment time.  Thank you. Contact information: [place] [address] [phone]                Discharge Diagnoses:  Principal Problem:   Morbid obesity ([place])   Surgical Procedure: Laparoscopic Sleeve Gastrectomy, upper endoscopy  Discharge Condition: Good Disposition: Home  Diet recommendation: Postoperative sleeve gastrectomy diet (liquids only)  Filed Weights   10/08/21 1000  Weight: (!) 146.4 kg     Hospital Course:  The patient was admitted for a planned laparoscopic sleeve gastrectomy. Please see operative note. Preoperatively the patient was given 5000 units of subcutaneous heparin for DVT prophylaxis. ERAS protocol was used. On the evening of postoperative day 0, the patient was started on water and ice chips. On postoperative day 1 the patient had no fever or tachycardia and was tolerating water in their diet was gradually advanced throughout the day. The patient was ambulating without difficulty. Their vital signs are stable without fever or tachycardia. Therapeutic lovenox began the evening of postop day 1. She tolerated this well and  hemoglobin had remained stable after this. The patient had received discharge instructions and counseling. They were deemed stable for discharge and had met  discharge criteria.  Vitals:   10/10/21 0145 10/10/21 0458  BP: 114/71 (!) 101/56  Pulse: 73 67  Resp: 16 16  Temp: 98.2 F (36.8 C) 98 F (36.7 C)  SpO2: 95% 94%    Alert and well appearing Unlabored respirations Abdomen soft, nontender. Incisions c/d/I no cellulitis or hematoma. R hand swelling, mild,  noted from IV malfunction.   Discharge Instructions   Allergies as of 10/10/2021       Reactions   Amlodipine Besylate Swelling   Other reaction(s): edema at 10 mg dose   Codeine Hives   Triamcinolone Nausea Only   Latex Itching, Rash        Medication List     STOP taking these medications    triamterene-hydrochlorothiazide 37.5-25 MG tablet Commonly known as: MAXZIDE-25       TAKE these medications    acetaminophen 325 MG tablet Commonly known as: TYLENOL Take 650 mg by mouth every 4 (four) hours as needed for moderate pain. What changed: Another medication with the same name was added. Make sure you understand how and when to take each.   acetaminophen 500 MG tablet Commonly known as: TYLENOL Take 2 tablets (1,000 mg total) by mouth every 8 (eight) hours for 5 days. What changed: You were already taking a medication with the same name, and this prescription was added. Make sure you understand how and when to take each.   albuterol 108 (90 Base) MCG/ACT inhaler Commonly known as: VENTOLIN HFA SMARTSIG:2 Puff(s) By Mouth Every 4 Hours PRN   amphetamine-dextroamphetamine 15 MG 24 hr capsule Commonly known as: ADDERALL XR Take 15  mg by mouth 2 (two) times daily.   buPROPion 150 MG 24 hr tablet Commonly known as: WELLBUTRIN XL Take 150 mg by mouth daily.   buPROPion 200 MG 12 hr tablet Commonly known as: WELLBUTRIN SR Take 1 tablet (200 mg total) by mouth daily.   DULoxetine 30 MG capsule Commonly known as: CYMBALTA Take 60 mg by mouth daily.   enoxaparin 150 MG/ML injection Commonly known as: LOVENOX Inject 1 mL (1 syringe) into the skin  every 12 (twelve) hours for 28 days.   gabapentin 400 MG capsule Commonly known as: NEURONTIN Take 400 mg by mouth 3 (three) times daily.   metoprolol tartrate 25 MG tablet Commonly known as: LOPRESSOR TAKE 1/2 TABLET(12.5 MG TOTAL) BY MOUTH TWICE DAILY Notes to patient: Monitor Blood Pressure Daily and keep a log for primary care physician.  You may need to make changes to your medications with rapid weight loss.     ondansetron 4 MG disintegrating tablet Commonly known as: ZOFRAN-ODT Take 1 tablet (4 mg total) by mouth every 6 (six) hours as needed for nausea or vomiting.   pantoprazole 40 MG tablet Commonly known as: PROTONIX Take 1 tablet (40 mg total) by mouth daily.   traMADol 50 MG tablet Commonly known as: ULTRAM Take 1 tablet (50 mg total) by mouth every 6 (six) hours as needed (pain).   triamcinolone ointment 0.1 % Commonly known as: KENALOG Apply 1 application topically daily as needed (rash).   VITAMIN C GUMMIE PO Take 2 tablets by mouth daily.   Xarelto 20 MG Tabs tablet Generic drug: rivaroxaban TAKE 1 TABLET(20 MG) BY MOUTH DAILY WITH SUPPER Notes to patient: Resume 11/07/21 after completing course of Lovenox. DO NOT TAKE THIS MEDICATION WHILE TAKING LOVENOX.        Follow-up Information     Clovis Riley, MD. Go on 11/06/2021.   Specialty: General Surgery Why: at 9:20am. Please arrive 15 minutes piror to your appointment time.  Thank you. Contact information: [address] [place] [address] [phone]         Surgery, [place] [facility]. Go on 11/28/2021.   Specialty: General Surgery Why: at 9:20am with Dr. Kae Heller.  Please arrive 15 minutes prior to your appointment time.  Thank you. Contact information: [place] [address] [place] [address] [phone]                  The results of significant diagnostics from this hospitalization (including imaging, microbiology, ancillary and laboratory) are  listed below for reference.    Significant Diagnostic Studies: DG Chest 2 View  Result Date: 09/27/2021 CLINICAL DATA:  65 year old female with history of shortness of breath, cough and wheezing for the past 4 weeks. EXAM: CHEST - 2 VIEW COMPARISON:  Chest x-ray 04/30/2021. FINDINGS: Lung volumes are normal. No consolidative airspace disease. No pleural effusions. No pneumothorax. No pulmonary nodule or mass noted. Pulmonary vasculature and the cardiomediastinal silhouette are within normal limits. Atherosclerosis in the thoracic aorta. IMPRESSION: 1.  No radiographic evidence of acute cardiopulmonary disease. 2. Aortic atherosclerosis. Electronically Signed   By: Vinnie Langton M.D.   On: 09/27/2021 15:09    Labs: Basic Metabolic Panel: Recent Labs  Lab 10/09/21 0419  NA 136  K 4.4  CL 103  CO2 25  GLUCOSE 104*  BUN 19  CREATININE 0.93  CALCIUM 8.5*  MG 1.8   Liver Function Tests: Recent Labs  Lab 10/09/21 0419  AST 19  ALT  15  ALKPHOS 40  BILITOT 0.7  PROT 6.2*  ALBUMIN 3.3*    CBC: Recent Labs  Lab 10/09/21 0419 10/10/21 0415  WBC 7.4 9.7  NEUTROABS 6.4 6.8  HGB 11.9* 11.2*  HCT 37.9 35.4*  MCV 82.8 83.9  PLT 233 219    CBG: No results for input(s): GLUCAP in the last 168 hours.  Principal Problem:   Morbid obesity ([place])   Signed:  [facility], [place] [phone] 10/10/2021, 8:32 AM

## 2021-10-10 NOTE — Progress Notes (Signed)
Patient successfully administered Lovenox injection to herself this morning.

## 2021-10-10 NOTE — Progress Notes (Signed)
Swelling noted to right hand. IVF stopped, IV removed, heat applied, and hand elevated.

## 2021-10-11 ENCOUNTER — Other Ambulatory Visit (HOSPITAL_COMMUNITY): Payer: Self-pay

## 2021-10-12 ENCOUNTER — Other Ambulatory Visit (HOSPITAL_COMMUNITY): Payer: Self-pay

## 2021-10-14 ENCOUNTER — Telehealth (HOSPITAL_COMMUNITY): Payer: Self-pay | Admitting: *Deleted

## 2021-10-14 ENCOUNTER — Telehealth: Payer: Self-pay | Admitting: Skilled Nursing Facility1

## 2021-10-14 NOTE — Telephone Encounter (Signed)
Pt was leaving [facility] during out post op phone call.  Pt has concerns with swelling in feet and hands, bruising around one incision, and diarrhea. Will f/u with pt 10/15/21.  1.  Tell me about your pain and pain management? Pt denies any current pain.  2.  Let's talk about fluid intake.  How much total fluid are you taking in? Pt states that she is not able to meet goal of 64 oz of fluid today.  Pt has been able to consume approx. 20-25oz of fluid per day since surgery. Pt states that she is lactose intolerant so cannot do the protein shakes.  Discussed with pt and daughter options for non flavored plant based protein powder to add to different liquids and yogurt options.  We also discussed drinking alkaline waters and protein waters for hydration also.  Encouraged pt to revisit discharge instructions for complete list of options during Phase 2 diet.  Pt instructed to assess status and suggestions daily utilizing Hydration Action Plan on discharge folder and to call [facility] if in the "red zone".   3.  How much protein have you taken in the last 2 days? Pt is not meeting protein goals.  See #2.  4.  Have you had nausea?  Tell me about when have experienced nausea and what you did to help? Pt  c/o nausea, but has not taken nausea medication.  Encouraged pt to take medication to alleviate symptoms.   5.  Has the frequency or color changed with your urine? Pt states the she is using the bathroom but it's mostly to have BMs.   6.  Tell me what your incisions look like? "Incisions look fine" with the exception of 1 has some bruising around it.  Per pt, states the PA called it a hematoma.   Per pt, PA is consulting with Dr. Fredricka Bonine about the need to continue with Lovenox injections.  Discussed with pt techniques for Lovenox injections to make sure that she is not giving them in the same areas all the time, to rotate site. Per pt, the other incisions look "fine".   7.  Have you been passing gas? BM? Pt states  that she is having BMs. Pt states that they are liquid stools. Last BM 10/14/21.     8.  If a problem or question were to arise who would you call?  Do you know contact numbers for [facility], [facility], and [facility]? Pt c/o swelling in hands and feet, diarrhea and some lightheadedness at times.  Pt was seen in office today by PA.  Pt stated they discussed IVF but pt declined due to swelling in hands and feet.  Pt can describe s/sx of dehydration.  Pt knows to call [facility] for surgical, [facility] for nutrition, and [facility] for non-urgent questions or concerns.    9.  How has the walking going? Pt states she is walking around and able to be active without much difficulty.   10. Are you still using your incentive spirometer?  If so, how often? Pt encouraged to use incentive spirometer, at least 10x every hour while awake until she sees the surgeon.  11.  How are your vitamins and calcium going?  How are you taking them? Pt is not able to tolerate MVI and calcium.  Discussed options for pt to purchase.     Reminded patient that the first 30 days post-operatively are important for successful recovery.  Practice good hand hygiene, wearing a mask when appropriate (since optional in  most places), and minimizing exposure to people who live outside of the home, especially if they are exhibiting any respiratory, GI, or illness-like symptoms.

## 2021-10-14 NOTE — Telephone Encounter (Signed)
Pt states she had diarrhea really bad yesterday.  Pt states the protein shakes make her sick.   Pt states fluid in general is going fine.    Advice: Try unflavored protein powder in broth Try plant based protein shakes Push fluids every 10 minutes

## 2021-10-15 ENCOUNTER — Telehealth (HOSPITAL_COMMUNITY): Payer: Self-pay | Admitting: *Deleted

## 2021-10-15 NOTE — Telephone Encounter (Signed)
Called pt to f/u with pt after concerns expressed during post op f/u call yesterday.    Pt was able to purchase other protein and fluid options.  Pt is currently drinking a protein water and tolerating well.  Pt denies any vomiting or diarrhea today.  Pt states that she and her daughter have a plan to try and get in more fluids and protein today.  We set a goal of at least 72fl oz and 45g of protein.  Pt shared that she is feeling "depressed" or stuck since having complications and issues after being discharged. I listened to her concerns and reassured her that this early in recovery that her feelings are normal and to stay encouraged.  To focus on making goals each day and giving herself time to heal.    Will f/u with pt tomorrow.  Pt instructed to call myself or Dr. Derrill Memo office for any further questions or concerns.

## 2021-10-16 ENCOUNTER — Telehealth (HOSPITAL_COMMUNITY): Payer: Self-pay | Admitting: *Deleted

## 2021-10-17 ENCOUNTER — Inpatient Hospital Stay (HOSPITAL_COMMUNITY)
Admission: EM | Admit: 2021-10-17 | Discharge: 2021-10-23 | DRG: 920 | Disposition: A | Discharge status: Home / Self Care | Payer: Medicare Other | Attending: Surgery | Admitting: Surgery

## 2021-10-17 ENCOUNTER — Other Ambulatory Visit: Payer: Self-pay

## 2021-10-17 ENCOUNTER — Emergency Department (HOSPITAL_COMMUNITY): Payer: Medicare Other

## 2021-10-17 ENCOUNTER — Encounter (HOSPITAL_COMMUNITY): Payer: Self-pay

## 2021-10-17 ENCOUNTER — Observation Stay (HOSPITAL_COMMUNITY): Payer: Medicare Other

## 2021-10-17 DIAGNOSIS — G4733 Obstructive sleep apnea (adult) (pediatric): Secondary | ICD-10-CM | POA: Diagnosis present

## 2021-10-17 DIAGNOSIS — I10 Essential (primary) hypertension: Secondary | ICD-10-CM | POA: Diagnosis present

## 2021-10-17 DIAGNOSIS — M7989 Other specified soft tissue disorders: Secondary | ICD-10-CM | POA: Diagnosis not present

## 2021-10-17 DIAGNOSIS — Z86718 Personal history of other venous thrombosis and embolism: Secondary | ICD-10-CM | POA: Diagnosis not present

## 2021-10-17 DIAGNOSIS — Y848 Other medical procedures as the cause of abnormal reaction of the patient, or of later complication, without mention of misadventure at the time of the procedure: Secondary | ICD-10-CM | POA: Diagnosis present

## 2021-10-17 DIAGNOSIS — K91870 Postprocedural hematoma of a digestive system organ or structure following a digestive system procedure: Principal | ICD-10-CM | POA: Diagnosis present

## 2021-10-17 DIAGNOSIS — Z7901 Long term (current) use of anticoagulants: Secondary | ICD-10-CM

## 2021-10-17 DIAGNOSIS — M545 Low back pain, unspecified: Secondary | ICD-10-CM | POA: Diagnosis not present

## 2021-10-17 DIAGNOSIS — I4821 Permanent atrial fibrillation: Secondary | ICD-10-CM | POA: Diagnosis not present

## 2021-10-17 DIAGNOSIS — R42 Dizziness and giddiness: Secondary | ICD-10-CM | POA: Diagnosis not present

## 2021-10-17 DIAGNOSIS — I4891 Unspecified atrial fibrillation: Secondary | ICD-10-CM | POA: Diagnosis not present

## 2021-10-17 DIAGNOSIS — Z8249 Family history of ischemic heart disease and other diseases of the circulatory system: Secondary | ICD-10-CM

## 2021-10-17 DIAGNOSIS — F32A Depression, unspecified: Secondary | ICD-10-CM | POA: Diagnosis not present

## 2021-10-17 DIAGNOSIS — Z91040 Latex allergy status: Secondary | ICD-10-CM | POA: Diagnosis not present

## 2021-10-17 DIAGNOSIS — G8929 Other chronic pain: Secondary | ICD-10-CM | POA: Diagnosis not present

## 2021-10-17 DIAGNOSIS — I4819 Other persistent atrial fibrillation: Secondary | ICD-10-CM | POA: Diagnosis not present

## 2021-10-17 DIAGNOSIS — L7632 Postprocedural hematoma of skin and subcutaneous tissue following other procedure: Secondary | ICD-10-CM | POA: Diagnosis not present

## 2021-10-17 DIAGNOSIS — I700 Atherosclerosis of aorta: Secondary | ICD-10-CM | POA: Diagnosis present

## 2021-10-17 DIAGNOSIS — Z87891 Personal history of nicotine dependence: Secondary | ICD-10-CM | POA: Diagnosis not present

## 2021-10-17 DIAGNOSIS — K760 Fatty (change of) liver, not elsewhere classified: Secondary | ICD-10-CM | POA: Diagnosis not present

## 2021-10-17 DIAGNOSIS — D649 Anemia, unspecified: Secondary | ICD-10-CM

## 2021-10-17 DIAGNOSIS — R109 Unspecified abdominal pain: Secondary | ICD-10-CM | POA: Diagnosis not present

## 2021-10-17 DIAGNOSIS — Z6841 Body Mass Index (BMI) 40.0 and over, adult: Secondary | ICD-10-CM | POA: Diagnosis not present

## 2021-10-17 DIAGNOSIS — D62 Acute posthemorrhagic anemia: Secondary | ICD-10-CM | POA: Diagnosis present

## 2021-10-17 DIAGNOSIS — Z888 Allergy status to other drugs, medicaments and biological substances status: Secondary | ICD-10-CM | POA: Diagnosis not present

## 2021-10-17 DIAGNOSIS — T148XXA Other injury of unspecified body region, initial encounter: Secondary | ICD-10-CM | POA: Diagnosis present

## 2021-10-17 DIAGNOSIS — Z20822 Contact with and (suspected) exposure to covid-19: Secondary | ICD-10-CM | POA: Diagnosis present

## 2021-10-17 DIAGNOSIS — Z9884 Bariatric surgery status: Secondary | ICD-10-CM | POA: Diagnosis not present

## 2021-10-17 DIAGNOSIS — Z885 Allergy status to narcotic agent status: Secondary | ICD-10-CM

## 2021-10-17 DIAGNOSIS — Z79899 Other long term (current) drug therapy: Secondary | ICD-10-CM

## 2021-10-17 DIAGNOSIS — M7981 Nontraumatic hematoma of soft tissue: Secondary | ICD-10-CM | POA: Diagnosis not present

## 2021-10-17 LAB — COMPREHENSIVE METABOLIC PANEL
ALT: 23 U/L (ref 0–44)
AST: 16 U/L (ref 15–41)
Albumin: 3.4 g/dL — ABNORMAL LOW (ref 3.5–5.0)
Alkaline Phosphatase: 33 U/L — ABNORMAL LOW (ref 38–126)
Anion gap: 11 (ref 5–15)
BUN: 16 mg/dL (ref 8–23)
CO2: 18 mmol/L — ABNORMAL LOW (ref 22–32)
Calcium: 8.6 mg/dL — ABNORMAL LOW (ref 8.9–10.3)
Chloride: 103 mmol/L (ref 98–111)
Creatinine, Ser: 1.03 mg/dL — ABNORMAL HIGH (ref 0.44–1.00)
GFR, Estimated: >60 mL/min (ref >60)
Glucose, Bld: 102 mg/dL — ABNORMAL HIGH (ref 70–99)
Potassium: 3.8 mmol/L (ref 3.5–5.1)
Sodium: 132 mmol/L — ABNORMAL LOW (ref 135–145)
Total Bilirubin: 1.4 mg/dL — ABNORMAL HIGH (ref 0.3–1.2)
Total Protein: 6.6 g/dL (ref 6.5–8.1)

## 2021-10-17 LAB — CBC
HCT: 27.6 % — ABNORMAL LOW (ref 36.0–46.0)
HCT: 23.2 % — ABNORMAL LOW (ref 36.0–46.0)
HCT: 23.3 % — ABNORMAL LOW (ref 36.0–46.0)
HCT: 28.1 % — ABNORMAL LOW (ref 36.0–46.0)
Hemoglobin: 8.8 g/dL — ABNORMAL LOW (ref 12.0–15.0)
Hemoglobin: 7.6 g/dL — ABNORMAL LOW (ref 12.0–15.0)
Hemoglobin: 7.5 g/dL — ABNORMAL LOW (ref 12.0–15.0)
Hemoglobin: 9.3 g/dL — ABNORMAL LOW (ref 12.0–15.0)
MCH: 26.4 pg (ref 26.0–34.0)
MCH: 27.1 pg (ref 26.0–34.0)
MCH: 26.5 pg (ref 26.0–34.0)
MCH: 27.6 pg (ref 26.0–34.0)
MCHC: 31.9 g/dL (ref 30.0–36.0)
MCHC: 32.8 g/dL (ref 30.0–36.0)
MCHC: 32.2 g/dL (ref 30.0–36.0)
MCHC: 33.1 g/dL (ref 30.0–36.0)
MCV: 82.9 fL (ref 80.0–100.0)
MCV: 82.9 fL (ref 80.0–100.0)
MCV: 82.3 fL (ref 80.0–100.0)
MCV: 83.4 fL (ref 80.0–100.0)
Platelets: 230 10*3/uL (ref 150–400)
Platelets: 225 10*3/uL (ref 150–400)
Platelets: 257 10*3/uL (ref 150–400)
Platelets: 223 10*3/uL (ref 150–400)
RBC: 3.33 MIL/uL — ABNORMAL LOW (ref 3.87–5.11)
RBC: 2.8 MIL/uL — ABNORMAL LOW (ref 3.87–5.11)
RBC: 2.83 MIL/uL — ABNORMAL LOW (ref 3.87–5.11)
RBC: 3.37 MIL/uL — ABNORMAL LOW (ref 3.87–5.11)
RDW: 16.9 % — ABNORMAL HIGH (ref 11.5–15.5)
RDW: 17.1 % — ABNORMAL HIGH (ref 11.5–15.5)
RDW: 17.2 % — ABNORMAL HIGH (ref 11.5–15.5)
RDW: 16.7 % — ABNORMAL HIGH (ref 11.5–15.5)
WBC: 15.4 10*3/uL — ABNORMAL HIGH (ref 4.0–10.5)
WBC: 13.8 10*3/uL — ABNORMAL HIGH (ref 4.0–10.5)
WBC: 16.5 10*3/uL — ABNORMAL HIGH (ref 4.0–10.5)
WBC: 16 10*3/uL — ABNORMAL HIGH (ref 4.0–10.5)
nRBC: 0 % (ref 0.0–0.2)
nRBC: 0 % (ref 0.0–0.2)
nRBC: 0 % (ref 0.0–0.2)
nRBC: 0.1 % (ref 0.0–0.2)

## 2021-10-17 LAB — URINALYSIS, ROUTINE W REFLEX MICROSCOPIC
Glucose, UA: NEGATIVE mg/dL
Hgb urine dipstick: NEGATIVE
Ketones, ur: 80 mg/dL — AB
Leukocytes,Ua: NEGATIVE
Nitrite: NEGATIVE
Protein, ur: NEGATIVE mg/dL
Specific Gravity, Urine: 1.02 (ref 1.005–1.030)
pH: 6 (ref 5.0–8.0)

## 2021-10-17 LAB — LIPASE, BLOOD: Lipase: 28 U/L (ref 11–51)

## 2021-10-17 LAB — RESP PANEL BY RT-PCR (FLU A&B, COVID) ARPGX2
Influenza A by PCR: NEGATIVE
Influenza B by PCR: NEGATIVE
SARS Coronavirus 2 by RT PCR: NEGATIVE

## 2021-10-17 LAB — PREPARE RBC (CROSSMATCH)

## 2021-10-17 LAB — MRSA NEXT GEN BY PCR, NASAL: MRSA by PCR Next Gen: NOT DETECTED

## 2021-10-17 MED ORDER — CHLORHEXIDINE GLUCONATE CLOTH 2 % EX PADS
6.0000 | MEDICATED_PAD | Freq: Every day | CUTANEOUS | Status: DC
  Administered 2021-10-17 – 2021-10-23 (×7): 6 via TOPICAL

## 2021-10-17 MED ORDER — IOHEXOL 350 MG/ML SOLN
100.0000 mL | Freq: Once | INTRAVENOUS | Status: AC | PRN
  Administered 2021-10-17: 04:00:00 100 mL via INTRAVENOUS

## 2021-10-17 MED ORDER — OXYCODONE HCL 5 MG PO TABS: 5.0000 mg | ORAL_TABLET | ORAL | Status: DC | PRN

## 2021-10-17 MED ORDER — SODIUM CHLORIDE (PF) 0.9 % IJ SOLN
INTRAMUSCULAR | Status: AC
  Filled 2021-10-17: qty 50

## 2021-10-17 MED ORDER — OXYCODONE HCL 5 MG PO TABS
5.0000 mg | ORAL_TABLET | ORAL | Status: DC | PRN
  Administered 2021-10-19: 14:00:00 5 mg via ORAL
  Filled 2021-10-17: qty 1

## 2021-10-17 MED ORDER — ONDANSETRON 4 MG PO TBDP: 4.0000 mg | ORAL_TABLET | Freq: Four times a day (QID) | ORAL | Status: DC | PRN

## 2021-10-17 MED ORDER — METOPROLOL TARTRATE 5 MG/5ML IV SOLN
5.0000 mg | Freq: Four times a day (QID) | INTRAVENOUS | Status: DC | PRN
  Administered 2021-10-17: 22:00:00 5 mg via INTRAVENOUS
  Filled 2021-10-17: qty 5

## 2021-10-17 MED ORDER — METOPROLOL TARTRATE 5 MG/5ML IV SOLN
5.0000 mg | Freq: Once | INTRAVENOUS | Status: AC
  Administered 2021-10-17: 16:00:00 5 mg via INTRAVENOUS
  Filled 2021-10-17: qty 5

## 2021-10-17 MED ORDER — ONDANSETRON HCL 4 MG/2ML IJ SOLN
4.0000 mg | Freq: Four times a day (QID) | INTRAMUSCULAR | Status: DC | PRN
  Administered 2021-10-17 – 2021-10-22 (×9): 4 mg via INTRAVENOUS
  Filled 2021-10-17 (×9): qty 2

## 2021-10-17 MED ORDER — ACETAMINOPHEN 650 MG RE SUPP: 650.0000 mg | Freq: Four times a day (QID) | RECTAL | Status: DC | PRN

## 2021-10-17 MED ORDER — MORPHINE SULFATE (PF) 2 MG/ML IV SOLN: 2.0000 mg | INTRAVENOUS | Status: DC | PRN

## 2021-10-17 MED ORDER — LACTATED RINGERS IV SOLN: INTRAVENOUS | Status: DC

## 2021-10-17 MED ORDER — MORPHINE SULFATE (PF) 4 MG/ML IV SOLN
4.0000 mg | Freq: Once | INTRAVENOUS | Status: AC
  Administered 2021-10-17: 09:00:00 4 mg via INTRAVENOUS
  Filled 2021-10-17: qty 1

## 2021-10-17 MED ORDER — METHOCARBAMOL 500 MG PO TABS
500.0000 mg | ORAL_TABLET | Freq: Four times a day (QID) | ORAL | Status: DC | PRN
  Administered 2021-10-20 – 2021-10-21 (×2): 500 mg via ORAL
  Filled 2021-10-17 (×2): qty 1

## 2021-10-17 MED ORDER — HYDROMORPHONE HCL 1 MG/ML IJ SOLN
0.5000 mg | INTRAMUSCULAR | Status: DC | PRN
Start: 2021-10-17 — End: 2021-10-22
  Administered 2021-10-17: 20:00:00 1 mg via INTRAVENOUS
  Administered 2021-10-17: 17:00:00 0.5 mg via INTRAVENOUS
  Administered 2021-10-17 – 2021-10-22 (×21): 1 mg via INTRAVENOUS
  Filled 2021-10-17 (×24): qty 1

## 2021-10-17 MED ORDER — ACETAMINOPHEN 325 MG PO TABS
650.0000 mg | ORAL_TABLET | Freq: Four times a day (QID) | ORAL | Status: DC | PRN
  Administered 2021-10-20 – 2021-10-21 (×2): 650 mg via ORAL
  Filled 2021-10-17 (×2): qty 2

## 2021-10-17 MED ORDER — METOCLOPRAMIDE HCL 5 MG/ML IJ SOLN
10.0000 mg | Freq: Four times a day (QID) | INTRAMUSCULAR | Status: DC
  Administered 2021-10-17 – 2021-10-23 (×22): 10 mg via INTRAVENOUS
  Filled 2021-10-17 (×22): qty 2

## 2021-10-17 MED ORDER — LACTATED RINGERS IV BOLUS
1000.0000 mL | Freq: Once | INTRAVENOUS | Status: AC
  Administered 2021-10-17: 04:00:00 1000 mL via INTRAVENOUS

## 2021-10-17 MED ORDER — SODIUM CHLORIDE 0.9% IV SOLUTION: Freq: Once | INTRAVENOUS | Status: AC

## 2021-10-17 MED ORDER — TRAMADOL HCL 50 MG PO TABS: 50.0000 mg | ORAL_TABLET | Freq: Four times a day (QID) | ORAL | Status: DC | PRN

## 2021-10-17 NOTE — Progress Notes (Signed)
Pt HR 150-160s. Pt denies any chest pains or palpitations. EKG obtained and showed Afib w/ RVR. [facility] paged. NP Meuth to unit evaluate. Verbal order placed for IV metoprolol. Will continue to monitor.

## 2021-10-17 NOTE — H&P (Addendum)
Admission Note  VIA FANSLER 26-Nov-1955  [id number].    Requesting MD: Malvin Johns, MD Chief Complaint/Reason for Consult: Post-op abdominal pain and hematoma HPI:  Patient is a 65 year old female who recently underwent laparoscopic sleeve gastrectomy and hiatal hernia repair on 10/08/21 by Dr. Kae Heller. She was discharged from the hospital 12/15 in stable condition. Patient was on Xarelto for atrial fibrillation prior to surgery and was transitioned to lovenox injections on POD1, hemoglobin was stable after starting this and prior to discharge. Patient reports that Sunday she started noting some swelling and bruising at her right subcostal incision, she was evaluated by a PA in the office Monday and told to monitor for increased swelling or bruising. Yesterday patient and her sister noted significantly more swelling than earlier in the week with increased bruising of abdominal wall. Patient with increased abdominal pain, nausea, lightheadedness, fatigue and SOB. She denies fever, chills, urinary symptoms. She has not been taking in much because she just has not felt well, but does report drinking some protein supplement the last 2 days. She denies trauma to abdomen such a fall. General surgery asked to evaluate.   ROS: Review of Systems  Constitutional:  Positive for malaise/fatigue. Negative for chills and fever.  Respiratory:  Positive for shortness of breath.   Cardiovascular:  Negative for chest pain and leg swelling.  Gastrointestinal:  Positive for abdominal pain and nausea. Negative for vomiting.  Genitourinary:  Negative for dysuria, frequency and urgency.  Neurological:  Positive for dizziness and weakness.  All other systems reviewed and are negative.  Family History  Problem Relation Age of Onset   Diabetes Mellitus I Father    CVA Father    Arthritis Mother    Obesity Mother    Thyroid disease Mother    Liver disease Mother    CAD Maternal Grandfather      Past Medical History:  Diagnosis Date   Arthritis    Atrial fibrillation (HCC)    Chronic lower back pain    Depression    DVT (deep venous thrombosis) (HCC) 1990s   LLE   Dyspnea    Fallen arches    Family history of adverse reaction to anesthesia    Mother had rash and itching after anesthesia   Headache    Hypertension    Hyperthyroidism ~ 2000   "fine now" (04/23/2016)   Joint pain    Lactose intolerance    Leg edema    Obesity    Pneumonia 1980s X 1   Snoring    has not had sleep study but suspects sleep apnea    Past Surgical History:  Procedure Laterality Date   BIOPSY  08/09/2021   Procedure: BIOPSY;  Surgeon: Felicie Morn, MD;  Location: [facility];  Service: General;;   CARDIOVERSION N/A 06/18/2016   Procedure: CARDIOVERSION;  Surgeon: Larey Dresser, MD;  Location: [facility];  Service: Cardiovascular;  Laterality: N/A;   CARDIOVERSION N/A 07/11/2016   Procedure: CARDIOVERSION;  Surgeon: Lelon Perla, MD;  Location: [facility]COPY;  Service: Cardiovascular;  Laterality: N/A;   CARDIOVERSION N/A 07/02/2017   Procedure: CARDIOVERSION;  Surgeon: Sanda Klein, MD;  Location: [facility];  Service: Cardiovascular;  Laterality: N/A;   CARDIOVERSION N/A 09/29/2017   Procedure: CARDIOVERSION;  Surgeon: Larey Dresser, MD;  Location: [facility] ENDOSCOPY;  Service: Cardiovascular;  Laterality: N/A;   ENDOMETRIAL ABLATION  ~2006   ESOPHAGOGASTRODUODENOSCOPY N/A 08/09/2021   Procedure: ESOPHAGOGASTRODUODENOSCOPY (EGD);  Surgeon:  Stechschulte, Nickola Major, MD;  Location: [facility];  Service: General;  Laterality: N/A;   HIATAL HERNIA REPAIR N/A 10/08/2021   Procedure: HERNIA REPAIR HIATAL;  Surgeon: Clovis Riley, MD;  Location: [facility];  Service: General;  Laterality: N/A;   LAPAROSCOPIC GASTRIC SLEEVE RESECTION N/A 10/08/2021   Procedure: LAPAROSCOPIC GASTRIC SLEEVE RESECTION;  Surgeon: Clovis Riley, MD;  Location: [facility];  Service: General;  Laterality:  N/A;   TONSILLECTOMY  1960s   TUBAL LIGATION  1979   UPPER GI ENDOSCOPY N/A 10/08/2021   Procedure: UPPER GI ENDOSCOPY;  Surgeon: Clovis Riley, MD;  Location: [facility];  Service: General;  Laterality: N/A;    Social History:  reports that she quit smoking about 33 years ago. Her smoking use included cigarettes. She has a 10.00 pack-year smoking history. She has never used smokeless tobacco. She reports that she does not currently use alcohol after a past usage of about 4.0 standard drinks per week. She reports that she does not currently use drugs after having used the following drugs: Marijuana.  Allergies:  Allergies  Allergen Reactions   Amlodipine Besylate Swelling    Other reaction(s): edema at 10 mg dose   Codeine Hives   Triamcinolone Nausea Only   Latex Itching and Rash    (Not in a hospital admission)   Blood pressure (!) 124/95, pulse (!) 120, temperature 97.7 F (36.5 C), temperature source Oral, resp. rate (!) 21, height 5\' 2"  (1.575 m), weight (!) 146.1 kg, SpO2 100 %. Physical Exam:  General: pleasant, WD, obese female who is laying in bed and appears in pain HEENT: head is normocephalic, atraumatic.  Sclera are anicteric.  Pupils equal and round. Ears and nose without any masses or lesions.  Mouth is pink and moist Heart: sinus tachycardia in the 120s.  Normal s1,s2. No obvious murmurs, gallops, or rubs noted.  Palpable radial and pedal pulses bilaterally Lungs: CTAB, no wheezes, rhonchi, or rales noted.  Respiratory effort nonlabored Abd: soft, ttp over areas of ecchymosis, abdomen as pictured below, ND, incisions c/d/I  MS: all 4 extremities are symmetrical with no cyanosis, clubbing, or edema. Skin: warm and dry with no masses, lesions, or rashes Neuro: Cranial nerves 2-12 grossly intact, sensation is normal throughout Psych: A&Ox3 with an appropriate affect.   Results for orders placed or performed during the hospital encounter of 10/17/21 (from the past 48  hour(s))  Lipase, blood     Status: None   Collection Time: 10/17/21  1:30 AM  Result Value Ref Range   Lipase 28 11 - 51 U/L    Comment: Performed at [facility], [place] [address], [place], [place] [address]  Comprehensive metabolic panel     Status: Abnormal   Collection Time: 10/17/21  1:30 AM  Result Value Ref Range   Sodium 132 (L) 135 - 145 mmol/L   Potassium 3.8 3.5 - 5.1 mmol/L   Chloride 103 98 - 111 mmol/L   CO2 18 (L) 22 - 32 mmol/L   Glucose, Bld 102 (H) 70 - 99 mg/dL    Comment: Glucose reference range applies only to samples taken after fasting for at least 8 hours.   BUN 16 8 - 23 mg/dL   Creatinine, Ser 1.03 (H) 0.44 - 1.00 mg/dL   Calcium 8.6 (L) 8.9 - 10.3 mg/dL   Total Protein 6.6 6.5 - 8.1 g/dL   Albumin 3.4 (L) 3.5 - 5.0 g/dL   AST 16 15 - 41  U/L   ALT 23 0 - 44 U/L   Alkaline Phosphatase 33 (L) 38 - 126 U/L   Total Bilirubin 1.4 (H) 0.3 - 1.2 mg/dL   GFR, Estimated >60 >60 mL/min    Comment: (NOTE) Calculated using the CKD-EPI Creatinine Equation (2021)    Anion gap 11 5 - 15    Comment: Performed at [facility], [place] [address], [place], [place] [address]  CBC     Status: Abnormal   Collection Time: 10/17/21  1:30 AM  Result Value Ref Range   WBC 15.4 (H) 4.0 - 10.5 K/uL   RBC 3.33 (L) 3.87 - 5.11 MIL/uL   Hemoglobin 8.8 (L) 12.0 - 15.0 g/dL   HCT 27.6 (L) 36.0 - 46.0 %   MCV 82.9 80.0 - 100.0 fL   MCH 26.4 26.0 - 34.0 pg   MCHC 31.9 30.0 - 36.0 g/dL   RDW 16.9 (H) 11.5 - 15.5 %   Platelets 230 150 - 400 K/uL   nRBC 0.0 0.0 - 0.2 %    Comment: Performed at [facility], [place] [address], [place], [place] [address]  Type and screen [place]     Status: None   Collection Time: 10/17/21  3:31 AM  Result Value Ref Range   ABO/RH(D) A POS    Antibody Screen NEG    Sample Expiration      10/20/2021,2359 Performed at [facility] [address],  [place], [place] [address]   Resp Panel by RT-PCR (Flu A&B, Covid) Nasopharyngeal Swab     Status: None   Collection Time: 10/17/21  4:05 AM   Specimen: Nasopharyngeal Swab; Nasopharyngeal(NP) swabs in vial transport medium  Result Value Ref Range   SARS Coronavirus 2 by RT PCR NEGATIVE NEGATIVE    Comment: (NOTE) SARS-CoV-2 target nucleic acids are NOT DETECTED.  The SARS-CoV-2 RNA is generally detectable in upper respiratory specimens during the acute phase of infection. The lowest concentration of SARS-CoV-2 viral copies this assay can detect is 138 copies/mL. A negative result does not preclude SARS-Cov-2 infection and should not be used as the sole basis for treatment or other patient management decisions. A negative result may occur with  improper specimen collection/handling, submission of specimen other than nasopharyngeal swab, presence of viral mutation(s) within the areas targeted by this assay, and inadequate number of viral copies(<138 copies/mL). A negative result must be combined with clinical observations, patient history, and epidemiological information. The expected result is Negative.  Fact Sheet for Patients:  EntrepreneurPulse.com.au  Fact Sheet for Healthcare Providers:  IncredibleEmployment.be  This test is no t yet approved or cleared by the [place] FDA and  has been authorized for detection and/or diagnosis of SARS-CoV-2 by FDA under an Emergency Use Authorization (EUA). This EUA will remain  in effect (meaning this test can be used) for the duration of the COVID-19 declaration under Section 564(b)(1) of the Act, 21 U.S.C.section 360bbb-3(b)(1), unless the authorization is terminated  or revoked sooner.       Influenza A by PCR NEGATIVE NEGATIVE   Influenza B by PCR NEGATIVE NEGATIVE    Comment: (NOTE) The Xpert Xpress SARS-CoV-2/FLU/RSV plus assay is intended as an aid in the diagnosis of influenza from  Nasopharyngeal swab specimens and should not be used as a sole basis for treatment. Nasal washings and aspirates are unacceptable for Xpert Xpress SARS-CoV-2/FLU/RSV testing.  Fact Sheet for Patients: EntrepreneurPulse.com.au  Fact Sheet for Healthcare Providers: IncredibleEmployment.be  This test is not yet approved or cleared by the [place] and has been authorized for detection and/or diagnosis of SARS-CoV-2 by FDA under an Emergency Use Authorization (EUA). This EUA will remain in effect (meaning this test can be used) for the duration of the COVID-19 declaration under Section 564(b)(1) of the Act, 21 U.S.C. section 360bbb-3(b)(1), unless the authorization is terminated or revoked.  Performed at [facility], [address] [place], [place] [address]   Urinalysis, Routine w reflex microscopic Urine, In & Out Cath     Status: Abnormal   Collection Time: 10/17/21  6:08 AM  Result Value Ref Range   Color, Urine YELLOW YELLOW   APPearance CLEAR CLEAR   Specific Gravity, Urine 1.020 1.005 - 1.030   pH 6.0 5.0 - 8.0   Glucose, UA NEGATIVE NEGATIVE mg/dL   Hgb urine dipstick NEGATIVE NEGATIVE   Bilirubin Urine SMALL (A) NEGATIVE   Ketones, ur 80 (A) NEGATIVE mg/dL   Protein, ur NEGATIVE NEGATIVE mg/dL   Nitrite NEGATIVE NEGATIVE   Leukocytes,Ua NEGATIVE NEGATIVE   RBC / HPF 0-5 0 - 5 RBC/hpf   WBC, UA 0-5 0 - 5 WBC/hpf   Bacteria, UA RARE (A) NONE SEEN   Squamous Epithelial / LPF 0-5 0 - 5   Mucus PRESENT    Hyaline Casts, UA PRESENT     Comment: Performed at [facility], [address], [place], [place] [address]   CT Abdomen Pelvis W Contrast  Result Date: 10/17/2021 CLINICAL DATA:  Postop gastric sleeve surgery with abdominal pain. EXAM: CT ABDOMEN AND PELVIS WITH CONTRAST TECHNIQUE: Multidetector CT imaging of the abdomen and pelvis was performed using the standard protocol following  bolus administration of intravenous contrast. CONTRAST:  OMNIPAQUE IOHEXOL 350 MG/ML SOLN COMPARISON:  10/16/2018. FINDINGS: Lower chest: No acute abnormality. There is prominence of the left atrium, unchanged from the prior exam. Hepatobiliary: No focal liver abnormality is seen. Hepatic steatosis. No gallstones, gallbladder wall thickening, or biliary dilatation. Pancreas: Unremarkable. No pancreatic ductal dilatation or surrounding inflammatory changes. Spleen: Normal in size without focal abnormality. Adrenals/Urinary Tract: No adrenal nodule or mass. There is a hypodensity in the left kidney measuring 1.3 cm, likely cyst. Parapelvic cysts are noted at the left renal hilum. No renal or ureteral calculus or obstructive uropathy bilaterally. The bladder is unremarkable. Stomach/Bowel: Gastric surgical changes are noted. No bowel obstruction, free air or pneumatosis. A normal appendix is present in the right lower quadrant. No focal bowel wall thickening. Vascular/Lymphatic: No significant vascular findings are present. No enlarged abdominal or pelvic lymph nodes. Reproductive: Uterus and bilateral adnexa are unremarkable. Other: Small amount of free fluid in the pelvis. There is a small fat containing umbilical hernia. Fat stranding and skin thickening are noted in the mid to low anterior abdominal wall. There is a fluid collection in the subcutaneous fat in the anterior abdominal wall extending from the right upper quadrant inferiorly to the midline measuring 22.3 x 8.4 x 7.2 cm. A smaller collection is noted in the anterior abdominal wall in the right upper quadrant measuring 7.9 x 4.4 x. 3.4 cm. Hyperdense focus is noted in the subcutaneous fat in the right upper quadrant measuring 2.4 x 2.2 cm. Musculoskeletal: Mild degenerative changes in the thoracolumbar spine. No acute osseous abnormality. IMPRESSION: 1. Status post gastric sleeve surgery. Subcutaneous fat stranding, skin thickening, and multiple  fluid collections are identified in the anterior abdominal wall. Differential diagnosis includes seroma, hematoma, or  abscess. 2. Left renal cysts. 3. Mild hepatic steatosis. Electronically Signed   By: Brett Fairy M.D.   On: 10/17/2021 04:06      Assessment/Plan S/p laparoscopic sleeve gastrectomy, hiatal hernia repair, upper endoscopy 10/08/21 Dr. Kae Heller Post-operative abdominal wall hematoma  ABL anemia  - hgb 8.8 from 11.2, hct 27.6 from 35.4 - repeat cbc now then q6h x 4 - anticipate that patient will likely need blood later today - type and screen done in ED - hold lovenox - if able apply abdominal binder and ice packs - keep NPO for now - admit to SDU  FEN: NPO, LR @ 75 cc/h VTE: SCDs, hold chemical agents in setting of ABL anemia  ID: WBC 15, afebrile, I think she is likely hemeconcentrated will follow on repeat CBCs, monitor for now  Atrial fibrillation on lovenox - tele and holding anticoagulation for now Hx of DVT in the 1990s HTN Hx of Hyperthyroidism Chronic low back pain Arthritis  Depression Morbid Obesity - BMI 58.89  Norm Parcel, [facility] 10/17/2021, 8:25 AM Please see Amion for pager number during day hours 7:00am-4:30pm

## 2021-10-17 NOTE — Consult Note (Signed)
Chief Complaint: Patient was seen in consultation today for  Chief Complaint  Patient presents with   Post-op Problem    Referring Physician(s):   Supervising Physician: Marliss Coots  Patient Status: [facility] - ED  History of Present Illness: [name] is a 65 y.o. female with a medical history significant for atrial fibrillation (Xarelto), DVT, HTN and obesity. She is 9 days post-op for sleeve gastrectomy and was discharged home on therapeutic lovenox with plans to transition back to Xarelto in a few weeks. She developed worsening abdominal pain yesterday along with abdominal swelling and dizziness. She presented to the [facility] ED today and work up was significant for low hemoglobin levels (8.8) and a subcutaneous hematoma (identified at an office visit two days ago but now significantly increased in size).  CT Abdomen/Pelvis with contrast 10/17/21 Other: Small amount of free fluid in the pelvis. There is a small fat containing umbilical hernia. Fat stranding and skin thickening are noted in the mid to low anterior abdominal wall. There is a fluid collection in the subcutaneous fat in the anterior abdominal wall extending from the right upper quadrant inferiorly to the midline measuring 22.3 x 8.4 x 7.2 cm. A smaller collection is noted in the anterior abdominal wall in the right upper quadrant measuring 7.9 x 4.4 x. 3.4 cm. Hyperdense focus is noted in the subcutaneous fat in the right upper quadrant measuring 2.4 x 2.2 cm. IMPRESSION: 1. Status post gastric sleeve surgery. Subcutaneous fat stranding, skin thickening, and multiple fluid collections are identified in the anterior abdominal wall. Differential diagnosis includes seroma, hematoma, or abscess. 2. Left renal cysts. 3. Mild hepatic steatosis.  Interventional Radiology has been asked by the Surgical team to evaluate this patient for an image-guided hematoma aspiration with possible drain placement. Imaging reviewed  and procedure approved by Dr. Elby Showers.   Past Medical History:  Diagnosis Date   Arthritis    Atrial fibrillation (HCC)    Chronic lower back pain    Depression    DVT (deep venous thrombosis) (HCC) 1990s   LLE   Dyspnea    Fallen arches    Family history of adverse reaction to anesthesia    Mother had rash and itching after anesthesia   Headache    Hypertension    Hyperthyroidism ~ 2000   "fine now" (04/23/2016)   Joint pain    Lactose intolerance    Leg edema    Obesity    Pneumonia 1980s X 1   Snoring    has not had sleep study but suspects sleep apnea    Past Surgical History:  Procedure Laterality Date   BIOPSY  08/09/2021   Procedure: BIOPSY;  Surgeon: Quentin Ore, MD;  Location: [facility] ENDOSCOPY;  Service: General;;   CARDIOVERSION N/A 06/18/2016   Procedure: CARDIOVERSION;  Surgeon: Laurey Morale, MD;  Location: [facility] ENDOSCOPY;  Service: Cardiovascular;  Laterality: N/A;   CARDIOVERSION N/A 07/11/2016   Procedure: CARDIOVERSION;  Surgeon: Lewayne Bunting, MD;  Location: [facility] ENDOSCOPY;  Service: Cardiovascular;  Laterality: N/A;   CARDIOVERSION N/A 07/02/2017   Procedure: CARDIOVERSION;  Surgeon: Thurmon Fair, MD;  Location: [facility] ENDOSCOPY;  Service: Cardiovascular;  Laterality: N/A;   CARDIOVERSION N/A 09/29/2017   Procedure: CARDIOVERSION;  Surgeon: Laurey Morale, MD;  Location: [facility] ENDOSCOPY;  Service: Cardiovascular;  Laterality: N/A;   ENDOMETRIAL ABLATION  ~2006   ESOPHAGOGASTRODUODENOSCOPY N/A 08/09/2021   Procedure: ESOPHAGOGASTRODUODENOSCOPY (EGD);  Surgeon: Quentin Ore, MD;  Location: [facility] ENDOSCOPY;  Service: General;  Laterality: N/A;   HIATAL HERNIA REPAIR N/A 10/08/2021   Procedure: HERNIA REPAIR HIATAL;  Surgeon: Clovis Riley, MD;  Location: [facility] ORS;  Service: General;  Laterality: N/A;   LAPAROSCOPIC GASTRIC SLEEVE RESECTION N/A 10/08/2021   Procedure: LAPAROSCOPIC GASTRIC SLEEVE RESECTION;  Surgeon: Clovis Riley, MD;  Location: [facility]  ORS;  Service: General;  Laterality: N/A;   TONSILLECTOMY  1960s   TUBAL LIGATION  1979   UPPER GI ENDOSCOPY N/A 10/08/2021   Procedure: UPPER GI ENDOSCOPY;  Surgeon: Clovis Riley, MD;  Location: [facility] ORS;  Service: General;  Laterality: N/A;    Allergies: Amlodipine besylate, Codeine, Triamcinolone, and Latex  Medications: Prior to Admission medications   Medication Sig Start Date End Date Taking? Authorizing Provider  acetaminophen (TYLENOL) 325 MG tablet Take 650 mg by mouth every 4 (four) hours as needed for moderate pain.   Yes [provider]  albuterol (VENTOLIN HFA) 108 (90 Base) MCG/ACT inhaler Inhale 1-2 puffs into the lungs every 6 (six) hours as needed for shortness of breath or wheezing. 09/26/21  Yes [provider]  amphetamine-dextroamphetamine (ADDERALL XR) 15 MG 24 hr capsule Take 15 mg by mouth 2 (two) times daily. 07/05/21  Yes [provider]  Ascorbic Acid (VITAMIN C GUMMIE PO) Take 2 tablets by mouth daily.   Yes [provider]  buPROPion (WELLBUTRIN XL) 150 MG 24 hr tablet Take 150 mg by mouth daily. for appetite suppression   Yes [provider]  DULoxetine (CYMBALTA) 30 MG capsule Take 60 mg by mouth daily.   Yes [provider]  enoxaparin (LOVENOX) 150 MG/ML injection Inject 1 mL (1 syringe) into the skin every 12 (twelve) hours for 28 days. 10/10/21 11/07/21 Yes Clovis Riley, MD  gabapentin (NEURONTIN) 400 MG capsule Take 400 mg by mouth 3 (three) times daily. 01/01/20  Yes [provider]  metoprolol tartrate (LOPRESSOR) 25 MG tablet TAKE 1/2 TABLET(12.5 MG TOTAL) BY MOUTH TWICE DAILY Patient taking differently: Take 12.5 mg by mouth 2 (two) times daily. 03/29/21  Yes Baldwin Jamaica, PA-C  ondansetron (ZOFRAN-ODT) 4 MG disintegrating tablet Dissolve 1 tablet (4 mg total) by mouth every 6 (six) hours as needed for nausea or vomiting. 10/10/21  Yes Clovis Riley, MD  pantoprazole (PROTONIX) 40  MG tablet Take 1 tablet (40 mg total) by mouth daily. 10/10/21  Yes Clovis Riley, MD  traMADol (ULTRAM) 50 MG tablet Take 1 tablet (50 mg total) by mouth every 6 (six) hours as needed (pain). 10/10/21  Yes Connor, Chelsea A, MD  XARELTO 20 MG TABS tablet TAKE 1 TABLET(20 MG) BY MOUTH DAILY WITH SUPPER Patient not taking: Reported on 10/17/2021 10/03/21   Werner Lean, MD     Family History  Problem Relation Age of Onset   Diabetes Mellitus I Father    CVA Father    Arthritis Mother    Obesity Mother    Thyroid disease Mother    Liver disease Mother    CAD Maternal Grandfather     Social History   Socioeconomic History   Marital status: Married    Spouse name: TERRY   Number of children: 2   Years of education: Not on file   Highest education level: Not on file  Occupational History   Occupation: Disabled  Tobacco Use   Smoking status: Former    Packs/day: 1.00    Years: 10.00    Pack years: 10.00  Types: Cigarettes    Quit date: 10/28/1987    Years since quitting: 33.9   Smokeless tobacco: Never  Vaping Use   Vaping Use: Never used  Substance and Sexual Activity   Alcohol use: Not Currently    Alcohol/week: 4.0 standard drinks    Types: 4 Glasses of wine per week    Comment: recently quit   Drug use: Not Currently    Types: Marijuana    Comment: 04/23/2016 "recreational marijuana in my late teens/early 20's"   Sexual activity: Not Currently    Birth control/protection: Post-menopausal  Other Topics Concern   Not on file  Social History Narrative   Lives in [place] with husband and 3 grandchildren.   Self employed office cleaner.   Social Determinants of Health   Financial Resource Strain: Not on file  Food Insecurity: Not on file  Transportation Needs: Not on file  Physical Activity: Not on file  Stress: Not on file  Social Connections: Not on file    Review of Systems: A 12 point ROS discussed and pertinent positives are indicated in  the HPI above.  All other systems are negative.  Review of Systems  Unable to perform ROS: Acuity of condition   Vital Signs: BP 134/79    Pulse (!) 139    Temp 99.3 F (37.4 C) (Oral)    Resp (!) 25    Ht 5\' 2"  (1.575 m)    Wt (!) 322 lb (146.1 kg)    SpO2 99%    BMI 58.89 kg/m   Physical Exam Constitutional:      General: She is in acute distress.     Appearance: She is obese. She is ill-appearing.  HENT:     Mouth/Throat:     Mouth: Mucous membranes are moist.     Pharynx: Oropharynx is clear.  Cardiovascular:     Rate and Rhythm: Rhythm irregular.     Comments: Afib with RVR Pulmonary:     Effort: Pulmonary effort is normal.     Breath sounds: Normal breath sounds.  Abdominal:     General: Bowel sounds are normal.     Tenderness: There is abdominal tenderness.     Comments: Several small surgical incisions. Sites are clean and dry. Abdomen is very tender and firm. Discoloration/hematoma   Musculoskeletal:     Right lower leg: Edema present.     Left lower leg: Edema present.  Skin:    General: Skin is warm and dry.  Neurological:     Mental Status: She is alert and oriented to person, place, and time.    Imaging: DG Chest 2 View  Result Date: 09/27/2021 CLINICAL DATA:  65 year old female with history of shortness of breath, cough and wheezing for the past 4 weeks. EXAM: CHEST - 2 VIEW COMPARISON:  Chest x-ray 04/30/2021. FINDINGS: Lung volumes are normal. No consolidative airspace disease. No pleural effusions. No pneumothorax. No pulmonary nodule or mass noted. Pulmonary vasculature and the cardiomediastinal silhouette are within normal limits. Atherosclerosis in the thoracic aorta. IMPRESSION: 1.  No radiographic evidence of acute cardiopulmonary disease. 2. Aortic atherosclerosis. Electronically Signed   By: Vinnie Langton M.D.   On: 09/27/2021 15:09   CT Abdomen Pelvis W Contrast  Result Date: 10/17/2021 CLINICAL DATA:  Postop gastric sleeve surgery with  abdominal pain. EXAM: CT ABDOMEN AND PELVIS WITH CONTRAST TECHNIQUE: Multidetector CT imaging of the abdomen and pelvis was performed using the standard protocol following bolus administration of intravenous contrast. CONTRAST:  175mL  OMNIPAQUE IOHEXOL 350 MG/ML SOLN COMPARISON:  10/16/2018. FINDINGS: Lower chest: No acute abnormality. There is prominence of the left atrium, unchanged from the prior exam. Hepatobiliary: No focal liver abnormality is seen. Hepatic steatosis. No gallstones, gallbladder wall thickening, or biliary dilatation. Pancreas: Unremarkable. No pancreatic ductal dilatation or surrounding inflammatory changes. Spleen: Normal in size without focal abnormality. Adrenals/Urinary Tract: No adrenal nodule or mass. There is a hypodensity in the left kidney measuring 1.3 cm, likely cyst. Parapelvic cysts are noted at the left renal hilum. No renal or ureteral calculus or obstructive uropathy bilaterally. The bladder is unremarkable. Stomach/Bowel: Gastric surgical changes are noted. No bowel obstruction, free air or pneumatosis. A normal appendix is present in the right lower quadrant. No focal bowel wall thickening. Vascular/Lymphatic: No significant vascular findings are present. No enlarged abdominal or pelvic lymph nodes. Reproductive: Uterus and bilateral adnexa are unremarkable. Other: Small amount of free fluid in the pelvis. There is a small fat containing umbilical hernia. Fat stranding and skin thickening are noted in the mid to low anterior abdominal wall. There is a fluid collection in the subcutaneous fat in the anterior abdominal wall extending from the right upper quadrant inferiorly to the midline measuring 22.3 x 8.4 x 7.2 cm. A smaller collection is noted in the anterior abdominal wall in the right upper quadrant measuring 7.9 x 4.4 x. 3.4 cm. Hyperdense focus is noted in the subcutaneous fat in the right upper quadrant measuring 2.4 x 2.2 cm. Musculoskeletal: Mild degenerative  changes in the thoracolumbar spine. No acute osseous abnormality. IMPRESSION: 1. Status post gastric sleeve surgery. Subcutaneous fat stranding, skin thickening, and multiple fluid collections are identified in the anterior abdominal wall. Differential diagnosis includes seroma, hematoma, or abscess. 2. Left renal cysts. 3. Mild hepatic steatosis. Electronically Signed   By: Brett Fairy M.D.   On: 10/17/2021 04:06    Labs:  CBC: Recent Labs    10/09/21 0419 10/10/21 0415 10/17/21 0130 10/17/21 0904  WBC 7.4 9.7 15.4* 13.8*  HGB 11.9* 11.2* 8.8* 7.6*  HCT 37.9 35.4* 27.6* 23.2*  PLT 233 219 230 225    COAGS: No results for input(s): INR, APTT in the last 8760 hours.  BMP: Recent Labs    02/24/21 0221 09/24/21 1111 10/09/21 0419 10/17/21 0130  NA 138 136 136 132*  K 4.2 4.6 4.4 3.8  CL 103 103 103 103  CO2 27 27 25  18*  GLUCOSE 84 93 104* 102*  BUN 18 30* 19 16  CALCIUM 8.8* 9.1 8.5* 8.6*  CREATININE 1.03* 1.09* 0.93 1.03*  GFRNONAA >60 57* >60 >60    LIVER FUNCTION TESTS: Recent Labs    02/24/21 0311 09/24/21 1111 10/09/21 0419 10/17/21 0130  BILITOT 0.7 0.8 0.7 1.4*  AST 16 20 19 16   ALT 15 18 15 23   ALKPHOS 42 55 40 33*  PROT 6.0* 7.4 6.2* 6.6  ALBUMIN 3.3* 4.0 3.3* 3.4*    TUMOR MARKERS: No results for input(s): AFPTM, CEA, CA199, CHROMGRNA in the last 8760 hours.  Assessment and Plan:  Sleeve gastrectomy with post-op hematoma secondary to anticoagulation: [name], 65 year old female, is tentatively scheduled 10/18/21 for an image-guided hematoma aspiration with possible drain placement. The patient was scheduled for this afternoon but she went into Afib with RVR.   Risks and benefits discussed with the patient including bleeding, infection and damage to adjacent structures,   All of the patient's questions were answered, patient is agreeable to proceed. She will be NPO at  midnight.   Consent signed and in chart.  Thank you for this  interesting consult.  I greatly enjoyed meeting Dale [place] and look forward to participating in their care.  A copy of this report was sent to the requesting provider on this date.  Electronically Signed: Alwyn Ren, AGACNP-BC [phone] 10/17/2021, 2:11 PM   I spent a total of 20 Minutes    in face to face in clinical consultation, greater than 50% of which was counseling/coordinating care for hematoma aspiration with possible drain placement.

## 2021-10-17 NOTE — ED Notes (Signed)
CT called to get pt stat

## 2021-10-17 NOTE — ED Notes (Signed)
Patient transported to CT 

## 2021-10-17 NOTE — ED Provider Notes (Addendum)
[facility] DEPT Provider Note   CSN: [id number] Arrival date & time: 10/17/21  0041     History Chief Complaint  Patient presents with   Post-op Problem    [name] is a 65 y.o. female.  Patient is a 65 year old female who is status post gastric sleeve on December 13 by Dr. Windle Guard.  She had been doing well until 2 days ago when she developed an area of ecchymosis and swelling to her upper abdomen.  She had a postop visit and was seen in the office at that time and that swollen area was about the size of an orange.  It was marked with a marker.  Over the last 2 days its progressively gotten more swollen and ecchymotic [place] its encompassing her entire right abdomen.  She started becoming dizzy and lightheaded.  She has some shortness of breath on exertion.  She was previously taking Xarelto for atrial fibrillation but she has known history of.  Since the surgery, she has been taking Lovenox instead.  No known fevers.      Past Medical History:  Diagnosis Date   Arthritis    Atrial fibrillation (HCC)    Chronic lower back pain    Depression    DVT (deep venous thrombosis) (HCC) 1990s   LLE   Dyspnea    Fallen arches    Family history of adverse reaction to anesthesia    Mother had rash and itching after anesthesia   Headache    Hypertension    Hyperthyroidism ~ 2000   "fine now" (04/23/2016)   Joint pain    Lactose intolerance    Leg edema    Obesity    Pneumonia 1980s X 1   Snoring    has not had sleep study but suspects sleep apnea    Patient Active Problem List   Diagnosis Date Noted   Insulin resistance 05/11/2020   DOE (dyspnea on exertion) 02/09/2020   Depression 09/26/2019   Vitamin D deficiency 09/01/2019   Class 3 severe obesity with serious comorbidity and body mass index (BMI) of 50.0 to 59.9 in adult [facility]) 09/01/2019   Prediabetes 08/15/2019   Atrial fibrillation ([place])    Chest pain 04/22/2016   Hypokalemia  04/22/2016   Morbid obesity ([place]) 04/22/2016   Hypertension 04/22/2016   Paroxysmal A-fib ([place]) 04/22/2016   Hypomagnesemia 04/22/2016    Past Surgical History:  Procedure Laterality Date   BIOPSY  08/09/2021   Procedure: BIOPSY;  Surgeon: Felicie Morn, MD;  Location: [facility];  Service: General;;   CARDIOVERSION N/A 06/18/2016   Procedure: CARDIOVERSION;  Surgeon: Larey Dresser, MD;  Location: [place];  Service: Cardiovascular;  Laterality: N/A;   CARDIOVERSION N/A 07/11/2016   Procedure: CARDIOVERSION;  Surgeon: Lelon Perla, MD;  Location: [facility] ENDOSCOPY;  Service: Cardiovascular;  Laterality: N/A;   CARDIOVERSION N/A 07/02/2017   Procedure: CARDIOVERSION;  Surgeon: Sanda Klein, MD;  Location: [facility] ENDOSCOPY;  Service: Cardiovascular;  Laterality: N/A;   CARDIOVERSION N/A 09/29/2017   Procedure: CARDIOVERSION;  Surgeon: Larey Dresser, MD;  Location: [facility] ENDOSCOPY;  Service: Cardiovascular;  Laterality: N/A;   ENDOMETRIAL ABLATION  ~2006   ESOPHAGOGASTRODUODENOSCOPY N/A 08/09/2021   Procedure: ESOPHAGOGASTRODUODENOSCOPY (EGD);  Surgeon: Felicie Morn, MD;  Location: [facility];  Service: General;  Laterality: N/A;   HIATAL HERNIA REPAIR N/A 10/08/2021   Procedure: HERNIA REPAIR HIATAL;  Surgeon: Clovis Riley, MD;  Location: [facility]S;  Service: General;  Laterality: N/A;  LAPAROSCOPIC GASTRIC SLEEVE RESECTION N/A 10/08/2021   Procedure: LAPAROSCOPIC GASTRIC SLEEVE RESECTION;  Surgeon: Berna Bue, MD;  Location: [facility];  Service: General;  Laterality: N/A;   TONSILLECTOMY  1960s   TUBAL LIGATION  1979   UPPER GI ENDOSCOPY N/A 10/08/2021   Procedure: UPPER GI ENDOSCOPY;  Surgeon: Berna Bue, MD;  Location: [facility];  Service: General;  Laterality: N/A;     OB History     Gravida  2   Para      Term      Preterm      AB      Living  2      SAB      IAB      Ectopic      Multiple      Live Births               Family History  Problem Relation Age of Onset   Diabetes Mellitus I Father    CVA Father    Arthritis Mother    Obesity Mother    Thyroid disease Mother    Liver disease Mother    CAD Maternal Grandfather     Social History   Tobacco Use   Smoking status: Former    Packs/day: 1.00    Years: 10.00    Pack years: 10.00    Types: Cigarettes    Quit date: 10/28/1987    Years since quitting: 33.9   Smokeless tobacco: Never  Vaping Use   Vaping Use: Never used  Substance Use Topics   Alcohol use: Not Currently    Alcohol/week: 4.0 standard drinks    Types: 4 Glasses of wine per week    Comment: recently quit   Drug use: Not Currently    Types: Marijuana    Comment: 04/23/2016 "recreational marijuana in my late teens/early 20's"    Home Medications Prior to Admission medications   Medication Sig Start Date End Date Taking? Authorizing Provider  acetaminophen (TYLENOL) 325 MG tablet Take 650 mg by mouth every 4 (four) hours as needed for moderate pain.   Yes [provider]  albuterol (VENTOLIN HFA) 108 (90 Base) MCG/ACT inhaler Inhale 1-2 puffs into the lungs every 6 (six) hours as needed for shortness of breath or wheezing. 09/26/21  Yes [provider]  amphetamine-dextroamphetamine (ADDERALL XR) 15 MG 24 hr capsule Take 15 mg by mouth 2 (two) times daily. 07/05/21  Yes [provider]  Ascorbic Acid (VITAMIN C GUMMIE PO) Take 2 tablets by mouth daily.   Yes [provider]  buPROPion (WELLBUTRIN XL) 150 MG 24 hr tablet Take 150 mg by mouth daily. for appetite suppression   Yes [provider]  DULoxetine (CYMBALTA) 30 MG capsule Take 60 mg by mouth daily.   Yes [provider]  enoxaparin (LOVENOX) 150 MG/ML injection Inject 1 mL (1 syringe) into the skin every 12 (twelve) hours for 28 days. 10/10/21 11/07/21 Yes Berna Bue, MD  gabapentin (NEURONTIN) 400 MG capsule Take 400 mg by mouth 3 (three) times daily. 01/01/20   Yes [provider]  metoprolol tartrate (LOPRESSOR) 25 MG tablet TAKE 1/2 TABLET(12.5 MG TOTAL) BY MOUTH TWICE DAILY Patient taking differently: Take 12.5 mg by mouth 2 (two) times daily. 03/29/21  Yes Sheilah Pigeon, PA-C  ondansetron (ZOFRAN-ODT) 4 MG disintegrating tablet Dissolve 1 tablet (4 mg total) by mouth every 6 (six) hours as needed for nausea or vomiting. 10/10/21  Yes Fredricka Bonine,  Chelsea A, MD  pantoprazole (PROTONIX) 40 MG tablet Take 1 tablet (40 mg total) by mouth daily. 10/10/21  Yes Clovis Riley, MD  traMADol (ULTRAM) 50 MG tablet Take 1 tablet (50 mg total) by mouth every 6 (six) hours as needed (pain). 10/10/21  Yes Connor, Chelsea A, MD  XARELTO 20 MG TABS tablet TAKE 1 TABLET(20 MG) BY MOUTH DAILY WITH SUPPER Patient not taking: Reported on 10/17/2021 10/03/21   Werner Lean, MD    Allergies    Amlodipine besylate, Codeine, Triamcinolone, and Latex  Review of Systems   Review of Systems  Constitutional:  Positive for fatigue. Negative for chills, diaphoresis and fever.  HENT:  Negative for congestion, rhinorrhea and sneezing.   Eyes: Negative.   Respiratory:  Negative for cough, chest tightness and shortness of breath.   Cardiovascular:  Negative for chest pain and leg swelling.  Gastrointestinal:  Positive for abdominal distention and abdominal pain. Negative for blood in stool, diarrhea, nausea and vomiting.  Genitourinary:  Negative for difficulty urinating, flank pain, frequency and hematuria.  Musculoskeletal:  Negative for arthralgias and back pain.  Skin:  Negative for rash.  Neurological:  Positive for light-headedness. Negative for dizziness, speech difficulty, weakness, numbness and headaches.   Physical Exam Updated Vital Signs BP 110/72    Pulse (!) 119    Temp 97.7 F (36.5 C) (Oral)    Resp 18    Ht 5\' 2"  (1.575 m)    Wt (!) 146.1 kg    SpO2 100%    BMI 58.89 kg/m   Physical Exam Constitutional:      Appearance: She is  well-developed.  HENT:     Head: Normocephalic and atraumatic.  Eyes:     Pupils: Pupils are equal, round, and reactive to light.  Cardiovascular:     Rate and Rhythm: Normal rate and regular rhythm.     Heart sounds: Normal heart sounds.  Pulmonary:     Effort: Pulmonary effort is normal. No respiratory distress.     Breath sounds: Normal breath sounds. No wheezing or rales.  Chest:     Chest wall: No tenderness.  Abdominal:     General: Bowel sounds are normal.     Palpations: Abdomen is soft.     Tenderness: There is abdominal tenderness. There is no guarding or rebound.     Comments: Patient has a large amount of extensive ecchymosis to her abdomen.  It is firm to the touch.  Musculoskeletal:        General: Normal range of motion.     Cervical back: Normal range of motion and neck supple.  Lymphadenopathy:     Cervical: No cervical adenopathy.  Skin:    General: Skin is warm and dry.     Findings: No rash.  Neurological:     Mental Status: She is alert and oriented to person, place, and time.     ED Results / Procedures / Treatments   Labs (all labs ordered are listed, but only abnormal results are displayed) Labs Reviewed  COMPREHENSIVE METABOLIC PANEL - Abnormal; Notable for the following components:      Result Value   Sodium 132 (*)    CO2 18 (*)    Glucose, Bld 102 (*)    Creatinine, Ser 1.03 (*)    Calcium 8.6 (*)    Albumin 3.4 (*)    Alkaline Phosphatase 33 (*)    Total Bilirubin 1.4 (*)    All other components within  normal limits  CBC - Abnormal; Notable for the following components:   WBC 15.4 (*)    RBC 3.33 (*)    Hemoglobin 8.8 (*)    HCT 27.6 (*)    RDW 16.9 (*)    All other components within normal limits  RESP PANEL BY RT-PCR (FLU A&B, COVID) ARPGX2  LIPASE, BLOOD  URINALYSIS, ROUTINE W REFLEX MICROSCOPIC  TYPE AND SCREEN    EKG EKG Interpretation  Date/Time:  Thursday October 17 2021 02:03:15 EST Ventricular Rate:  140 PR  Interval:    QRS Duration: 97 QT Interval:  316 QTC Calculation: 454 R Axis:   20 Text Interpretation: Atrial fibrillation Abnormal R-wave progression, early transition Confirmed by Malvin Johns [id number]) on 10/17/2021 3:10:51 AM  Radiology CT Abdomen Pelvis W Contrast  Result Date: 10/17/2021 CLINICAL DATA:  Postop gastric sleeve surgery with abdominal pain. EXAM: CT ABDOMEN AND PELVIS WITH CONTRAST TECHNIQUE: Multidetector CT imaging of the abdomen and pelvis was performed using the standard protocol following bolus administration of intravenous contrast. CONTRAST:  139mL OMNIPAQUE IOHEXOL 350 MG/ML SOLN COMPARISON:  10/16/2018. FINDINGS: Lower chest: No acute abnormality. There is prominence of the left atrium, unchanged from the prior exam. Hepatobiliary: No focal liver abnormality is seen. Hepatic steatosis. No gallstones, gallbladder wall thickening, or biliary dilatation. Pancreas: Unremarkable. No pancreatic ductal dilatation or surrounding inflammatory changes. Spleen: Normal in size without focal abnormality. Adrenals/Urinary Tract: No adrenal nodule or mass. There is a hypodensity in the left kidney measuring 1.3 cm, likely cyst. Parapelvic cysts are noted at the left renal hilum. No renal or ureteral calculus or obstructive uropathy bilaterally. The bladder is unremarkable. Stomach/Bowel: Gastric surgical changes are noted. No bowel obstruction, free air or pneumatosis. A normal appendix is present in the right lower quadrant. No focal bowel wall thickening. Vascular/Lymphatic: No significant vascular findings are present. No enlarged abdominal or pelvic lymph nodes. Reproductive: Uterus and bilateral adnexa are unremarkable. Other: Small amount of free fluid in the pelvis. There is a small fat containing umbilical hernia. Fat stranding and skin thickening are noted in the mid to low anterior abdominal wall. There is a fluid collection in the subcutaneous fat in the anterior abdominal wall  extending from the right upper quadrant inferiorly to the midline measuring 22.3 x 8.4 x 7.2 cm. A smaller collection is noted in the anterior abdominal wall in the right upper quadrant measuring 7.9 x 4.4 x. 3.4 cm. Hyperdense focus is noted in the subcutaneous fat in the right upper quadrant measuring 2.4 x 2.2 cm. Musculoskeletal: Mild degenerative changes in the thoracolumbar spine. No acute osseous abnormality. IMPRESSION: 1. Status post gastric sleeve surgery. Subcutaneous fat stranding, skin thickening, and multiple fluid collections are identified in the anterior abdominal wall. Differential diagnosis includes seroma, hematoma, or abscess. 2. Left renal cysts. 3. Mild hepatic steatosis. Electronically Signed   By: Brett Fairy M.D.   On: 10/17/2021 04:06    Procedures Procedures   Medications Ordered in ED Medications  sodium chloride (PF) 0.9 % injection (has no administration in time range)  lactated ringers bolus 1,000 mL (1,000 mLs Intravenous New Bag/Given 10/17/21 0404)  iohexol (OMNIPAQUE) 350 MG/ML injection 100 mL (100 mLs Intravenous Contrast Given 10/17/21 0340)    ED Course  I have reviewed the triage vital signs and the nursing notes.  Pertinent labs & imaging results that were available during my care of the patient were reviewed by me and considered in my medical decision making (see chart for details).  MDM Rules/Calculators/A&P                         Patient is a 65 year old female who is status post gastric sleeve surgery on December 13.  She comes in with progressive ecchymosis and tenderness to her abdomen.  She now feels dizzy and lightheaded with some exertional shortness of breath.  Her hemoglobin has dropped from 11 to 8 over the last 7 days.  She is tachycardic into the 120s.  She has atrial fibrillation which is chronic for her.  She is using Lovenox shots.  CT scan shows large fluid pockets in the subcutaneous tissues of her abdomen consistent with probable  hematoma given her clinical exam of ecchymosis.  The largest measures 22 cm across.  She was typed and screened for possible blood.  I spoke with Dr. Barry Dienes who will see the patient and admit for further treatment.    Final Clinical Impression(s) / ED Diagnoses Final diagnoses:  Hematoma  Anemia, unspecified type    Rx / DC Orders ED Discharge Orders     None        Malvin Johns, MD 10/17/21 [id number]    Malvin Johns, MD 10/17/21 [phone]

## 2021-10-17 NOTE — ED Triage Notes (Addendum)
Pt reports she had a gastric sleeve on 12/13 and presents with very large hematoma to entire abdomen/belly. She also reports lightheadedness and exertional SOB. Pt reports she is no longer taking Xarleto, takes Lovenox now.

## 2021-10-18 ENCOUNTER — Inpatient Hospital Stay (HOSPITAL_COMMUNITY): Payer: Medicare Other

## 2021-10-18 ENCOUNTER — Telehealth (HOSPITAL_COMMUNITY): Payer: Self-pay | Admitting: *Deleted

## 2021-10-18 DIAGNOSIS — Y848 Other medical procedures as the cause of abnormal reaction of the patient, or of later complication, without mention of misadventure at the time of the procedure: Secondary | ICD-10-CM | POA: Diagnosis present

## 2021-10-18 DIAGNOSIS — G8929 Other chronic pain: Secondary | ICD-10-CM | POA: Diagnosis present

## 2021-10-18 DIAGNOSIS — Z6841 Body Mass Index (BMI) 40.0 and over, adult: Secondary | ICD-10-CM | POA: Diagnosis not present

## 2021-10-18 DIAGNOSIS — Z8249 Family history of ischemic heart disease and other diseases of the circulatory system: Secondary | ICD-10-CM | POA: Diagnosis not present

## 2021-10-18 DIAGNOSIS — Z79899 Other long term (current) drug therapy: Secondary | ICD-10-CM | POA: Diagnosis not present

## 2021-10-18 DIAGNOSIS — Z91040 Latex allergy status: Secondary | ICD-10-CM | POA: Diagnosis not present

## 2021-10-18 DIAGNOSIS — I4819 Other persistent atrial fibrillation: Secondary | ICD-10-CM | POA: Diagnosis not present

## 2021-10-18 DIAGNOSIS — Z86718 Personal history of other venous thrombosis and embolism: Secondary | ICD-10-CM | POA: Diagnosis not present

## 2021-10-18 DIAGNOSIS — I4821 Permanent atrial fibrillation: Secondary | ICD-10-CM | POA: Diagnosis present

## 2021-10-18 DIAGNOSIS — Z87891 Personal history of nicotine dependence: Secondary | ICD-10-CM | POA: Diagnosis not present

## 2021-10-18 DIAGNOSIS — Z9884 Bariatric surgery status: Secondary | ICD-10-CM | POA: Diagnosis not present

## 2021-10-18 DIAGNOSIS — R109 Unspecified abdominal pain: Secondary | ICD-10-CM | POA: Diagnosis not present

## 2021-10-18 DIAGNOSIS — G4733 Obstructive sleep apnea (adult) (pediatric): Secondary | ICD-10-CM | POA: Diagnosis present

## 2021-10-18 DIAGNOSIS — Z888 Allergy status to other drugs, medicaments and biological substances status: Secondary | ICD-10-CM | POA: Diagnosis not present

## 2021-10-18 DIAGNOSIS — I700 Atherosclerosis of aorta: Secondary | ICD-10-CM | POA: Diagnosis present

## 2021-10-18 DIAGNOSIS — Z20822 Contact with and (suspected) exposure to covid-19: Secondary | ICD-10-CM | POA: Diagnosis present

## 2021-10-18 DIAGNOSIS — M7989 Other specified soft tissue disorders: Secondary | ICD-10-CM | POA: Diagnosis not present

## 2021-10-18 DIAGNOSIS — T148XXA Other injury of unspecified body region, initial encounter: Secondary | ICD-10-CM | POA: Diagnosis present

## 2021-10-18 DIAGNOSIS — M545 Low back pain, unspecified: Secondary | ICD-10-CM | POA: Diagnosis present

## 2021-10-18 DIAGNOSIS — Z885 Allergy status to narcotic agent status: Secondary | ICD-10-CM | POA: Diagnosis not present

## 2021-10-18 DIAGNOSIS — L7632 Postprocedural hematoma of skin and subcutaneous tissue following other procedure: Secondary | ICD-10-CM

## 2021-10-18 DIAGNOSIS — I10 Essential (primary) hypertension: Secondary | ICD-10-CM | POA: Diagnosis present

## 2021-10-18 DIAGNOSIS — I4891 Unspecified atrial fibrillation: Secondary | ICD-10-CM

## 2021-10-18 DIAGNOSIS — M7981 Nontraumatic hematoma of soft tissue: Secondary | ICD-10-CM | POA: Diagnosis not present

## 2021-10-18 DIAGNOSIS — Z7901 Long term (current) use of anticoagulants: Secondary | ICD-10-CM | POA: Diagnosis not present

## 2021-10-18 DIAGNOSIS — F32A Depression, unspecified: Secondary | ICD-10-CM | POA: Diagnosis present

## 2021-10-18 DIAGNOSIS — K91870 Postprocedural hematoma of a digestive system organ or structure following a digestive system procedure: Secondary | ICD-10-CM | POA: Diagnosis present

## 2021-10-18 DIAGNOSIS — D62 Acute posthemorrhagic anemia: Secondary | ICD-10-CM | POA: Diagnosis present

## 2021-10-18 HISTORY — PX: IR GUIDED DRAIN W CATHETER PLACEMENT: IMG719

## 2021-10-18 LAB — CBC
HCT: 30.1 % — ABNORMAL LOW (ref 36.0–46.0)
Hemoglobin: 9.7 g/dL — ABNORMAL LOW (ref 12.0–15.0)
MCH: 27.9 pg (ref 26.0–34.0)
MCHC: 32.2 g/dL (ref 30.0–36.0)
MCV: 86.5 fL (ref 80.0–100.0)
Platelets: 211 10*3/uL (ref 150–400)
RBC: 3.48 MIL/uL — ABNORMAL LOW (ref 3.87–5.11)
RDW: 17.1 % — ABNORMAL HIGH (ref 11.5–15.5)
WBC: 15.4 10*3/uL — ABNORMAL HIGH (ref 4.0–10.5)
nRBC: 0 % (ref 0.0–0.2)

## 2021-10-18 LAB — BASIC METABOLIC PANEL
Anion gap: 11 (ref 5–15)
BUN: 16 mg/dL (ref 8–23)
CO2: 19 mmol/L — ABNORMAL LOW (ref 22–32)
Calcium: 8.6 mg/dL — ABNORMAL LOW (ref 8.9–10.3)
Chloride: 106 mmol/L (ref 98–111)
Creatinine, Ser: 0.87 mg/dL (ref 0.44–1.00)
GFR, Estimated: >60 mL/min (ref >60)
Glucose, Bld: 96 mg/dL (ref 70–99)
Potassium: 4.2 mmol/L (ref 3.5–5.1)
Sodium: 136 mmol/L (ref 135–145)

## 2021-10-18 LAB — PROTIME-INR
INR: 1.2 (ref 0.8–1.2)
Prothrombin Time: 15.1 seconds (ref 11.4–15.2)

## 2021-10-18 LAB — TSH: TSH: 3.083 u[IU]/mL (ref 0.350–4.500)

## 2021-10-18 MED ORDER — LIDOCAINE HCL 1 % IJ SOLN
INTRAMUSCULAR | Status: AC
  Filled 2021-10-18: qty 20

## 2021-10-18 MED ORDER — FLUMAZENIL 0.5 MG/5ML IV SOLN
INTRAVENOUS | Status: AC
  Filled 2021-10-18: qty 5

## 2021-10-18 MED ORDER — MIDAZOLAM HCL 2 MG/2ML IJ SOLN
INTRAMUSCULAR | Status: AC | PRN
  Administered 2021-10-18: 1 mg via INTRAVENOUS

## 2021-10-18 MED ORDER — CHLORHEXIDINE GLUCONATE 0.12 % MT SOLN
15.0000 mL | Freq: Two times a day (BID) | OROMUCOSAL | Status: DC
  Administered 2021-10-18 – 2021-10-22 (×7): 15 mL via OROMUCOSAL
  Filled 2021-10-18 (×9): qty 15

## 2021-10-18 MED ORDER — FENTANYL CITRATE (PF) 100 MCG/2ML IJ SOLN
INTRAMUSCULAR | Status: AC | PRN
  Administered 2021-10-18: 50 ug via INTRAVENOUS

## 2021-10-18 MED ORDER — LIDOCAINE HCL (PF) 1 % IJ SOLN
INTRAMUSCULAR | Status: AC | PRN
  Administered 2021-10-18: 10 mL via INTRADERMAL

## 2021-10-18 MED ORDER — NALOXONE HCL 0.4 MG/ML IJ SOLN
INTRAMUSCULAR | Status: AC
  Filled 2021-10-18: qty 1

## 2021-10-18 MED ORDER — MIDAZOLAM HCL 2 MG/2ML IJ SOLN
INTRAMUSCULAR | Status: AC
  Filled 2021-10-18: qty 4

## 2021-10-18 MED ORDER — IOHEXOL 300 MG/ML  SOLN: 50.0000 mL | Freq: Once | INTRAMUSCULAR | Status: DC | PRN

## 2021-10-18 MED ORDER — FENTANYL CITRATE (PF) 100 MCG/2ML IJ SOLN
INTRAMUSCULAR | Status: AC | PRN
Start: 2021-10-18 — End: 2021-10-18
  Administered 2021-10-18: 50 ug via INTRAVENOUS

## 2021-10-18 MED ORDER — METOPROLOL TARTRATE 12.5 MG HALF TABLET
12.5000 mg | ORAL_TABLET | Freq: Two times a day (BID) | ORAL | Status: DC
  Administered 2021-10-18 – 2021-10-19 (×4): 12.5 mg via ORAL
  Filled 2021-10-18 (×4): qty 1

## 2021-10-18 MED ORDER — ORAL CARE MOUTH RINSE
15.0000 mL | Freq: Two times a day (BID) | OROMUCOSAL | Status: DC
  Administered 2021-10-19 – 2021-10-21 (×4): 15 mL via OROMUCOSAL

## 2021-10-18 MED ORDER — FENTANYL CITRATE (PF) 100 MCG/2ML IJ SOLN
INTRAMUSCULAR | Status: AC
  Filled 2021-10-18: qty 4

## 2021-10-18 NOTE — Consult Note (Addendum)
Cardiology Consultation:   Patient ID: AYZEL STIGALL MRN: [id number]; DOB: April 19, 1956  Admit date: 10/17/2021 Date of Consult: 10/18/2021  PCP:  Harlan Stains, MD   [facility] Providers Cardiologist:  Werner Lean, MD  Electrophysiologist:  Thompson Grayer, MD     Patient Profile:   [name] is a 65 y.o. female with a hx of longstanding persistent a fib, HTN, h/o DVT, hyperthyroidism, OSA, obesity who is being seen 10/18/2021 for the evaluation of afib at the request of Dr. Kae Heller with surgery.  History of Present Illness:   Ms. Bona underwent a laparoscopic sleeve gastrectomy on 10/08/2021. She has a history of longstanding persistent afib on xarelto, which was held for 2 days prior to surgery. Patient tolerated the procedure well and was discharged 2 days later. At that point, her hemoglobin was stable at 11.2 and their vital signs were stable without fever or tachycardia. Discharged on therapeutic lovonox with plans to transition back to xarelto in 4 weeks.   Patient presented to the ED on 12/22 complaining of swelling and ecchymosis entire abdomen, lightheadedness, and exertional dyspnea. Bruising of the abdomen began two days ago on 12/20. Originally, the bruising was the size of an orange and was marked with a marker. The area continued to expand over the next two days and now extends over the majority of the abdomen. In the ED, hemoglobin was 8.8>>7.6, down from 11.2 a week before. RBC 3.33, WBC 5.4. Na 132, K 3.8, BUN 16, creatinine 1.03. EKG showed a fib with a rate of 140 BPM. Repeat EKG in the ICU showed afib with a rate of 163. Ct abdomen pelvis showed multiple fluid collections are identified in the anterior abdominal wall. Differential diagnosis includes seroma, hematoma, or abscess. Patient was seen by interventional radiology and plans to undergo image-guided hematoma aspiration with possible drain placement once heart rate is stable. Patient  given 2 units PRBC and hemoglobin rose to 9.7.  Patient is followed by cardiology and was last seen on 122/9/22. At that time, patient was doing well and did not have palpitations, dizziness, syncope. BP and HR were well controlled at home on Lopressor. Patient was cleared to undergo Gastric bypass surgery on 12/13 and to hold Xarelto for 2 days prior to surgery. It was recommended she resume xarelto ASAP after procedure due to history of DVT.   Past Medical History:  Diagnosis Date   Arthritis    Atrial fibrillation (HCC)    Chronic lower back pain    Depression    DVT (deep venous thrombosis) (HCC) 1990s   LLE   Dyspnea    Fallen arches    Family history of adverse reaction to anesthesia    Mother had rash and itching after anesthesia   Headache    Hypertension    Hyperthyroidism ~ 2000   "fine now" (04/23/2016)   Joint pain    Lactose intolerance    Leg edema    Obesity    Pneumonia 1980s X 1   Snoring    has not had sleep study but suspects sleep apnea    Past Surgical History:  Procedure Laterality Date   BIOPSY  08/09/2021   Procedure: BIOPSY;  Surgeon: Felicie Morn, MD;  Location: [facility];  Service: General;;   CARDIOVERSION N/A 06/18/2016   Procedure: CARDIOVERSION;  Surgeon: Larey Dresser, MD;  Location: [facility];  Service: Cardiovascular;  Laterality: N/A;   CARDIOVERSION N/A 07/11/2016   Procedure: CARDIOVERSION;  Surgeon:  Lelon Perla, MD;  Location: [facility];  Service: Cardiovascular;  Laterality: N/A;   CARDIOVERSION N/A 07/02/2017   Procedure: CARDIOVERSION;  Surgeon: Sanda Klein, MD;  Location: [facility];  Service: Cardiovascular;  Laterality: N/A;   CARDIOVERSION N/A 09/29/2017   Procedure: CARDIOVERSION;  Surgeon: Larey Dresser, MD;  Location: [facility];  Service: Cardiovascular;  Laterality: N/A;   ENDOMETRIAL ABLATION  ~2006   ESOPHAGOGASTRODUODENOSCOPY N/A 08/09/2021   Procedure: ESOPHAGOGASTRODUODENOSCOPY (EGD);   Surgeon: Felicie Morn, MD;  Location: [facility];  Service: General;  Laterality: N/A;   HIATAL HERNIA REPAIR N/A 10/08/2021   Procedure: HERNIA REPAIR HIATAL;  Surgeon: Clovis Riley, MD;  Location: [facility];  Service: General;  Laterality: N/A;   LAPAROSCOPIC GASTRIC SLEEVE RESECTION N/A 10/08/2021   Procedure: LAPAROSCOPIC GASTRIC SLEEVE RESECTION;  Surgeon: Clovis Riley, MD;  Location: [facility];  Service: General;  Laterality: N/A;   TONSILLECTOMY  1960s   TUBAL LIGATION  1979   UPPER GI ENDOSCOPY N/A 10/08/2021   Procedure: UPPER GI ENDOSCOPY;  Surgeon: Clovis Riley, MD;  Location: [facility];  Service: General;  Laterality: N/A;     Home Medications:  Prior to Admission medications   Medication Sig Start Date End Date Taking? Authorizing Provider  acetaminophen (TYLENOL) 325 MG tablet Take 650 mg by mouth every 4 (four) hours as needed for moderate pain.   Yes [provider]  albuterol (VENTOLIN HFA) 108 (90 Base) MCG/ACT inhaler Inhale 1-2 puffs into the lungs every 6 (six) hours as needed for shortness of breath or wheezing. 09/26/21  Yes [provider]  amphetamine-dextroamphetamine (ADDERALL XR) 15 MG 24 hr capsule Take 15 mg by mouth 2 (two) times daily. 07/05/21  Yes [provider]  Ascorbic Acid (VITAMIN C GUMMIE PO) Take 2 tablets by mouth daily.   Yes [provider]  buPROPion (WELLBUTRIN XL) 150 MG 24 hr tablet Take 150 mg by mouth daily. for appetite suppression   Yes [provider]  DULoxetine (CYMBALTA) 30 MG capsule Take 60 mg by mouth daily.   Yes [provider]  enoxaparin (LOVENOX) 150 MG/ML injection Inject 1 mL (1 syringe) into the skin every 12 (twelve) hours for 28 days. 10/10/21 11/07/21 Yes Clovis Riley, MD  gabapentin (NEURONTIN) 400 MG capsule Take 400 mg by mouth 3 (three) times daily. 01/01/20  Yes [provider]  metoprolol tartrate (LOPRESSOR) 25 MG tablet TAKE 1/2 TABLET(12.5  MG TOTAL) BY MOUTH TWICE DAILY Patient taking differently: Take 12.5 mg by mouth 2 (two) times daily. 03/29/21  Yes Baldwin Jamaica, PA-C  ondansetron (ZOFRAN-ODT) 4 MG disintegrating tablet Dissolve 1 tablet (4 mg total) by mouth every 6 (six) hours as needed for nausea or vomiting. 10/10/21  Yes Clovis Riley, MD  pantoprazole (PROTONIX) 40 MG tablet Take 1 tablet (40 mg total) by mouth daily. 10/10/21  Yes Clovis Riley, MD  traMADol (ULTRAM) 50 MG tablet Take 1 tablet (50 mg total) by mouth every 6 (six) hours as needed (pain). 10/10/21  Yes Connor, Chelsea A, MD  XARELTO 20 MG TABS tablet TAKE 1 TABLET(20 MG) BY MOUTH DAILY WITH SUPPER Patient not taking: Reported on 10/17/2021 10/03/21   Werner Lean, MD    Inpatient Medications: Scheduled Meds:  Chlorhexidine Gluconate Cloth  6 each Topical Daily   metoCLOPramide (REGLAN) injection  10 mg Intravenous Q6H   Continuous Infusions:  lactated ringers 75 mL/hr at 10/18/21 0600   PRN Meds: acetaminophen **  OR** acetaminophen, HYDROmorphone (DILAUDID) injection, methocarbamol, metoprolol tartrate, ondansetron **OR** ondansetron (ZOFRAN) IV, oxyCODONE  Allergies:    Allergies  Allergen Reactions   Amlodipine Besylate Swelling    Other reaction(s): edema at 10 mg dose   Codeine Hives   Triamcinolone Nausea Only   Latex Itching and Rash    Social History:   Social History   Socioeconomic History   Marital status: Married    Spouse name: TERRY   Number of children: 2   Years of education: Not on file   Highest education level: Not on file  Occupational History   Occupation: Disabled  Tobacco Use   Smoking status: Former    Packs/day: 1.00    Years: 10.00    Pack years: 10.00    Types: Cigarettes    Quit date: 10/28/1987    Years since quitting: 33.9   Smokeless tobacco: Never  Vaping Use   Vaping Use: Never used  Substance and Sexual Activity   Alcohol use: Not Currently    Alcohol/week: 4.0 standard  drinks    Types: 4 Glasses of wine per week    Comment: recently quit   Drug use: Not Currently    Types: Marijuana    Comment: 04/23/2016 "recreational marijuana in my late teens/early 20's"   Sexual activity: Not Currently    Birth control/protection: Post-menopausal  Other Topics Concern   Not on file  Social History Narrative   Lives in [place] with husband and 3 grandchildren.   Self employed office cleaner.   Social Determinants of Health   Financial Resource Strain: Not on file  Food Insecurity: Not on file  Transportation Needs: Not on file  Physical Activity: Not on file  Stress: Not on file  Social Connections: Not on file  Intimate Partner Violence: Not on file    Family History:    Family History  Problem Relation Age of Onset   Diabetes Mellitus I Father    CVA Father    Arthritis Mother    Obesity Mother    Thyroid disease Mother    Liver disease Mother    CAD Maternal Grandfather      ROS:  Please see the history of present illness.   All other ROS reviewed and negative.     Physical Exam/Data:   Vitals:   10/18/21 0400 10/18/21 0423 10/18/21 0500 10/18/21 0600  BP: (!) 146/79  124/65 123/83  Pulse:  (!) 127  (!) 102  Resp: 18 13 13 12   Temp: 98.9 F (37.2 C)     TempSrc: Oral     SpO2:  92%  94%  Weight:      Height:        Intake/Output Summary (Last 24 hours) at 10/18/2021 0734 Last data filed at 10/18/2021 0600 Gross per 24 hour  Intake 2103.08 ml  Output 0 ml  Net 2103.08 ml   Last 3 Weights 10/18/2021 10/17/2021 10/17/2021  Weight (lbs) (No Data) (No Data) 321 lb 3.4 oz  Weight (kg) (No Data) (No Data) 145.7 kg     Body mass index is 58.75 kg/m.  General:  Well nourished, well developed, appears tired  HEENT: normal Neck: no JVD Vascular: No carotid bruits; Radial pulses 2+ bilaterally Cardiac:  normal S1, S2; no murmur, irregularly irregular, tachycardic   Lungs:  clear to auscultation bilaterally, no wheezing, rhonchi  or rales  Abd: tender to palpation, large area of ecchymosis that covers majority of the abdomen, most prominent in the RLQ  Ext: no edema Musculoskeletal:  No deformities Skin: warm and dry  Neuro:  CNs 2-12 intact, no focal abnormalities noted Psych:  Normal affect   EKG:  The EKG was personally reviewed and demonstrates:  Irregularly irregular rhythm, rate 163, narrow QRS complexes consistent with A fib with RVR Telemetry:  Telemetry was personally reviewed and demonstrates:  Afib with occasional, PVCs, heart rate is usually around 105-120 with rare elevations to the 150s   Relevant CV Studies:  Echo 02/22/2021 1. Left ventricular ejection fraction, by estimation, is 55 to 60%. The  left ventricle has normal function. The left ventricle has no regional  wall motion abnormalities. There is moderate left ventricular hypertrophy.  Left ventricular diastolic function   could not be evaluated.   2. Right ventricular systolic function is normal. The right ventricular  size is normal. There is normal pulmonary artery systolic pressure.   3. Left atrial size was severely dilated.   4. The mitral valve is abnormal. Mild to moderate mitral valve  regurgitation.   5. The aortic valve is tricuspid. Aortic valve regurgitation is not  visualized.   6. Aortic dilatation noted. There is mild dilatation of the ascending  aorta measuring 42 mm.   7. The inferior vena cava is normal in size with greater than 50%  respiratory variability, suggesting right atrial pressure of 3 mmHg.   Nuclear Stress Test 02/23/2020 The left ventricular ejection fraction is normal (55-65%). Nuclear stress EF: 49%. There was no ST segment deviation noted during stress. The study is normal. This is a low risk study.   Normal pharmacologic nuclear stress test with no evidence for prior infarct or ischemia. LVEF 55%. Frequent PVCs during stress and in recovery.  Laboratory Data:  High Sensitivity Troponin:  No  results for input(s): TROPONINIHS in the last 720 hours.   Chemistry Recent Labs  Lab 10/17/21 0130 10/18/21 0307  NA 132* 136  K 3.8 4.2  CL 103 106  CO2 18* 19*  GLUCOSE 102* 96  BUN 16 16  CREATININE 1.03* 0.87  CALCIUM 8.6* 8.6*  GFRNONAA >60 >60  ANIONGAP 11 11    Recent Labs  Lab 10/17/21 0130  PROT 6.6  ALBUMIN 3.4*  AST 16  ALT 23  ALKPHOS 33*  BILITOT 1.4*   Lipids No results for input(s): CHOL, TRIG, HDL, LABVLDL, LDLCALC, CHOLHDL in the last 168 hours.  Hematology Recent Labs  Lab 10/17/21 1525 10/17/21 2028 10/18/21 0307  WBC 16.5* 16.0* 15.4*  RBC 2.83* 3.37* 3.48*  HGB 7.5* 9.3* 9.7*  HCT 23.3* 28.1* 30.1*  MCV 82.3 83.4 86.5  MCH 26.5 27.6 27.9  MCHC 32.2 33.1 32.2  RDW 17.2* 16.7* 17.1*  PLT 257 223 211   Thyroid No results for input(s): TSH, FREET4 in the last 168 hours.  BNPNo results for input(s): BNP, PROBNP in the last 168 hours.  DDimer No results for input(s): DDIMER in the last 168 hours.   Radiology/Studies:  CT Abdomen Pelvis W Contrast  Result Date: 10/17/2021 CLINICAL DATA:  Postop gastric sleeve surgery with abdominal pain. EXAM: CT ABDOMEN AND PELVIS WITH CONTRAST TECHNIQUE: Multidetector CT imaging of the abdomen and pelvis was performed using the standard protocol following bolus administration of intravenous contrast. CONTRAST:  OMNIPAQUE IOHEXOL 350 MG/ML SOLN COMPARISON:  10/16/2018. FINDINGS: Lower chest: No acute abnormality. There is prominence of the left atrium, unchanged from the prior exam. Hepatobiliary: No focal liver abnormality is seen. Hepatic steatosis. No gallstones, gallbladder wall thickening, or  biliary dilatation. Pancreas: Unremarkable. No pancreatic ductal dilatation or surrounding inflammatory changes. Spleen: Normal in size without focal abnormality. Adrenals/Urinary Tract: No adrenal nodule or mass. There is a hypodensity in the left kidney measuring 1.3 cm, likely cyst. Parapelvic cysts are noted at  the left renal hilum. No renal or ureteral calculus or obstructive uropathy bilaterally. The bladder is unremarkable. Stomach/Bowel: Gastric surgical changes are noted. No bowel obstruction, free air or pneumatosis. A normal appendix is present in the right lower quadrant. No focal bowel wall thickening. Vascular/Lymphatic: No significant vascular findings are present. No enlarged abdominal or pelvic lymph nodes. Reproductive: Uterus and bilateral adnexa are unremarkable. Other: Small amount of free fluid in the pelvis. There is a small fat containing umbilical hernia. Fat stranding and skin thickening are noted in the mid to low anterior abdominal wall. There is a fluid collection in the subcutaneous fat in the anterior abdominal wall extending from the right upper quadrant inferiorly to the midline measuring 22.3 x 8.4 x 7.2 cm. A smaller collection is noted in the anterior abdominal wall in the right upper quadrant measuring 7.9 x 4.4 x. 3.4 cm. Hyperdense focus is noted in the subcutaneous fat in the right upper quadrant measuring 2.4 x 2.2 cm. Musculoskeletal: Mild degenerative changes in the thoracolumbar spine. No acute osseous abnormality. IMPRESSION: 1. Status post gastric sleeve surgery. Subcutaneous fat stranding, skin thickening, and multiple fluid collections are identified in the anterior abdominal wall. Differential diagnosis includes seroma, hematoma, or abscess. 2. Left renal cysts. 3. Mild hepatic steatosis. Electronically Signed   By: Brett Fairy M.D.   On: 10/17/2021 04:06     Assessment and Plan:   [name] is a 65 y.o. female with a hx of longstanding persistent a fib, HTN, hyperthyroidism, h/o DVT, OSA, obesity who is being seen 10/18/2021 for the evaluation of afib at the request of Dr. Kae Heller with surgery.  A Fib with RVR: Patient has longstanding persistent A fib, usually well rate controlled with Lopressor 12.10mb BID. Anticoagulated with xarelto 20 mg daily, but patient  has been using lovenox 150 mg/ml injections since her gastric sleeve surgery on 12/13. Hemoglobin was 7.5 and is up to 9.7 following 2 units PRBC. Patient is currently on metoprolol 5mg  IV PRN and has only been given two doses.  - Tachycardia likely related to patient's anemia and discomfort  - Anticoagulation held in the setting of hematoma with planned aspiration and drain placement - Resume patient's home dose of Lopressor 12.5mg  PO BID.  HTN: well managed on home regiment - Resume home dose of Lopressor 12.5mg  BID   Hyperthyroidism: TSH last checked on 08/15/20 and was normal  - Recommend rechecking TSH  History of DVT  - Patient off anticoagulation due to hematoma and possible hematoma drainage  - DVT prophylaxis with bilateral SCDs   Risk Assessment/Risk Scores:   CHA2DS2-VASc Score = 3  This indicates a 3.2% annual risk of stroke. The patient's score is based upon: CHF History: 0 HTN History: 1 Diabetes History: 0 Stroke History: 0 Vascular Disease History: 1 Age Score: 0 Gender Score: 1       For questions or updates, please contact [facility] Please consult [email or web address] for contact info under    Signed, Margie Billet, PA-C  10/18/2021 7:34 AM  History and all data above reviewed.  Patient examined.  I agree with the findings as above.   She is now post drainage with percutaneous drain in place for abdominal hematoma.  She had unfortunately no complications related to anticoagulation after surgery.   She was on bridging Lovenox because of her history for the previous history of DVTs.   Currently her rate is well controlled.  She says her abdominal pain is controlled.  The patient exam reveals [id number]   ,  Lungs: Clear  ,  Abd: Large abdominal hematoma with tenderness, Ext no edema.  All available labs, radiology testing, previous records reviewed. Agree with documented assessment and plan. Atrial fib: Rate seems to be well controlled with reinitiation  of beta-blocker and pain management.  She obviously cannot be on anticoagulation until cleared from a surgical standpoint.  She and I had a discussion about this.   Jeneen Rinks Zahira Brummond  1:38 PM  10/18/2021

## 2021-10-18 NOTE — Progress Notes (Signed)
Subjective/Chief Complaint: sore abdomen Pt has sore abdomen  IR to day for hematoma drainage    Objective: Vital signs in last 24 hours: Temp:  [98 F (36.7 C)-99.3 F (37.4 C)] 98.9 F (37.2 C) (12/23 0400) Pulse Rate:  [46-139] 102 (12/23 0600) Resp:  [10-25] 12 (12/23 0600) BP: (89-185)/(61-129) 123/83 (12/23 0600) SpO2:  [88 %-100 %] 94 % (12/23 0600) Weight:  [145.7 kg] 145.7 kg (12/22 1623) Last BM Date: 10/13/21  Intake/Output from previous day: 12/22 0701 - 12/23 0700 In: 2103.1 [I.V.:1358.1; Blood:745] Out: 0  Intake/Output this shift: No intake/output data recorded.  General appearance: alert and cooperative Resp: clear to auscultation bilaterally Cardio: a fib RVR   Neurologic: Grossly normal Incision/Wound: large subcutaneous hematoma noted appears stable port sites intact   Lab Results:  Recent Labs    10/17/21 2028 10/18/21 0307  WBC 16.0* 15.4*  HGB 9.3* 9.7*  HCT 28.1* 30.1*  PLT 223 211   BMET Recent Labs    10/17/21 0130 10/18/21 0307  NA 132* 136  K 3.8 4.2  CL 103 106  CO2 18* 19*  GLUCOSE 102* 96  BUN 16 16  CREATININE 1.03* 0.87  CALCIUM 8.6* 8.6*   PT/INR Recent Labs    10/18/21 0307  LABPROT 15.1  INR 1.2   ABG No results for input(s): PHART, HCO3 in the last 72 hours.  Invalid input(s): PCO2, PO2  Studies/Results: CT Abdomen Pelvis W Contrast  Result Date: 10/17/2021 CLINICAL DATA:  Postop gastric sleeve surgery with abdominal pain. EXAM: CT ABDOMEN AND PELVIS WITH CONTRAST TECHNIQUE: Multidetector CT imaging of the abdomen and pelvis was performed using the standard protocol following bolus administration of intravenous contrast. CONTRAST:  OMNIPAQUE IOHEXOL 350 MG/ML SOLN COMPARISON:  10/16/2018. FINDINGS: Lower chest: No acute abnormality. There is prominence of the left atrium, unchanged from the prior exam. Hepatobiliary: No focal liver abnormality is seen. Hepatic steatosis. No gallstones, gallbladder  wall thickening, or biliary dilatation. Pancreas: Unremarkable. No pancreatic ductal dilatation or surrounding inflammatory changes. Spleen: Normal in size without focal abnormality. Adrenals/Urinary Tract: No adrenal nodule or mass. There is a hypodensity in the left kidney measuring 1.3 cm, likely cyst. Parapelvic cysts are noted at the left renal hilum. No renal or ureteral calculus or obstructive uropathy bilaterally. The bladder is unremarkable. Stomach/Bowel: Gastric surgical changes are noted. No bowel obstruction, free air or pneumatosis. A normal appendix is present in the right lower quadrant. No focal bowel wall thickening. Vascular/Lymphatic: No significant vascular findings are present. No enlarged abdominal or pelvic lymph nodes. Reproductive: Uterus and bilateral adnexa are unremarkable. Other: Small amount of free fluid in the pelvis. There is a small fat containing umbilical hernia. Fat stranding and skin thickening are noted in the mid to low anterior abdominal wall. There is a fluid collection in the subcutaneous fat in the anterior abdominal wall extending from the right upper quadrant inferiorly to the midline measuring 22.3 x 8.4 x 7.2 cm. A smaller collection is noted in the anterior abdominal wall in the right upper quadrant measuring 7.9 x 4.4 x. 3.4 cm. Hyperdense focus is noted in the subcutaneous fat in the right upper quadrant measuring 2.4 x 2.2 cm. Musculoskeletal: Mild degenerative changes in the thoracolumbar spine. No acute osseous abnormality. IMPRESSION: 1. Status post gastric sleeve surgery. Subcutaneous fat stranding, skin thickening, and multiple fluid collections are identified in the anterior abdominal wall. Differential diagnosis includes seroma, hematoma, or abscess. 2. Left renal cysts. 3. Mild hepatic steatosis.  Electronically Signed   By: Thornell Sartorius M.D.   On: 10/17/2021 04:06    Anti-infectives: Anti-infectives (From admission, onward)    None        Assessment/Plan: S/p laparoscopic sleeve gastrectomy, hiatal hernia repair, upper endoscopy 10/08/21 Dr. Fredricka Bonine Post-operative abdominal wall hematoma  ABL anemia  - hgb hgb 9.7 today after PRBC - a- hold lovenox - if able apply abdominal binder and ice packs - keep NPO for now- after procedure may resume bariatric liquid diet  - admit to SDU   FEN: NPO, LR @ 75 cc/h VTE: SCDs, hold chemical agents in setting of ABL anemia  ID: WBC 15, afebrile, I think she is likely hemeconcentrated will follow on repeat CBCs, monitor for now   Atrial fibrillation RVR  cards following  Hx of DVT in the 1990s HTN Hx of Hyperthyroidism Chronic low back pain Arthritis  Depression Morbid Obesity - BMI 58.89   LOS: 0 days    [name] 10/18/2021

## 2021-10-18 NOTE — Procedures (Signed)
Vascular and Interventional Radiology Procedure Note  Patient: [name] DOB: 22-May-1956 Medical Record Number: [id number] Note Date/Time: 10/18/21 2:14 PM   Performing Physician: Roanna Banning, MD Assistant(s): None  Diagnosis: Abdominal wall hematoma  Procedure:  DRAINAGE CATHETER PLACEMENT INTO ABDOMINAL WALL HEMATOMA  Anesthesia: Conscious Sedation Complications: None Estimated Blood Loss: Minimal Specimens: Sent for Gram Stain, Aerobe Culture, and Anerobe Culture  Findings:  Successful Ultrasound and Fluoroscopy-guided 12 F biliary drain catheter into R abdominal wall hematoma.  Plan:  - Flush drain with 10 mL Normal Saline every 12 hours. - Follow up drain evaluation / sinogram in 2 week(s).  See detailed procedure note with images in PACS. The patient tolerated the procedure well without incident or complication and was returned to Floor Bed in stable condition.    Roanna Banning, MD [facility] Rad[facility]   Pager. [phone] Clinic. [phone]

## 2021-10-19 LAB — CBC
HCT: 23.3 % — ABNORMAL LOW (ref 36.0–46.0)
Hemoglobin: 7.8 g/dL — ABNORMAL LOW (ref 12.0–15.0)
MCH: 28.1 pg (ref 26.0–34.0)
MCHC: 33.5 g/dL (ref 30.0–36.0)
MCV: 83.8 fL (ref 80.0–100.0)
Platelets: 214 10*3/uL (ref 150–400)
RBC: 2.78 MIL/uL — ABNORMAL LOW (ref 3.87–5.11)
RDW: 17.5 % — ABNORMAL HIGH (ref 11.5–15.5)
WBC: 12.2 10*3/uL — ABNORMAL HIGH (ref 4.0–10.5)
nRBC: 0.4 % — ABNORMAL HIGH (ref 0.0–0.2)

## 2021-10-19 MED ORDER — SODIUM CHLORIDE 0.9% FLUSH
10.0000 mL | Freq: Two times a day (BID) | INTRAVENOUS | Status: DC
  Administered 2021-10-19 – 2021-10-22 (×6): 10 mL

## 2021-10-19 NOTE — Progress Notes (Signed)
Subjective/Chief Complaint: sore abdomen IR drain in place, pain improved   Objective: Vital signs in last 24 hours: Temp:  [98.1 F (36.7 C)-98.7 F (37.1 C)] 98.1 F (36.7 C) (12/24 0400) Pulse Rate:  [31-123] 101 (12/24 0600) Resp:  [10-24] 13 (12/24 0600) BP: (111-171)/(48-86) 141/66 (12/24 0600) SpO2:  [92 %-100 %] 98 % (12/24 0600) Last BM Date: 10/13/21  Intake/Output from previous day: 12/23 0701 - 12/24 0700 In: 1767.8 [I.V.:1767.8] Out: 1200 [Urine:1100; Drains:100] Intake/Output this shift: No intake/output data recorded.  General appearance: alert and cooperative Resp: clear to auscultation bilaterally Cardio: a fib RVR   Neurologic: Grossly normal Incision/Wound: large subcutaneous hematoma noted appears stable port sites intact   Lab Results:  Recent Labs    10/17/21 2028 10/18/21 0307  WBC 16.0* 15.4*  HGB 9.3* 9.7*  HCT 28.1* 30.1*  PLT 223 211    BMET Recent Labs    10/17/21 0130 10/18/21 0307  NA 132* 136  K 3.8 4.2  CL 103 106  CO2 18* 19*  GLUCOSE 102* 96  BUN 16 16  CREATININE 1.03* 0.87  CALCIUM 8.6* 8.6*    PT/INR Recent Labs    10/18/21 0307  LABPROT 15.1  INR 1.2    ABG No results for input(s): PHART, HCO3 in the last 72 hours.  Invalid input(s): PCO2, PO2  Studies/Results: IR Guided Drain W Catheter Placement  Result Date: 10/18/2021 INDICATION: Abdominal wall hematoma. EXAM: ULTRASOUND AND FLUOROSCOPIC GUIDED PLACEMENT OF DRAINAGE CATHETER INTO A RIGHT ABDOMINAL WALL HEMATOMA COMPARISON:  CT AP, 10/17/2021. MEDICATIONS: None. CONTRAST:  None. ANESTHESIA/SEDATION: Moderate (conscious) sedation was employed during this procedure. A total of Versed 2 mg and Fentanyl 150 mcg was administered intravenously. Moderate Sedation Time: 20 minutes. The patient's level of consciousness and vital signs were monitored continuously by radiology nursing throughout the procedure under my direct supervision. FLUOROSCOPY TIME:  1  minutes 6 seconds (21 mGy) COMPLICATIONS: None immediate. TECHNIQUE: Informed written consent was obtained from the the patient and/or patient's representative after a discussion of the risks, benefits and alternatives to treatment. Questions regarding the procedure were encouraged and answered. A timeout was performed prior to the initiation of the procedure. Ultrasound scanning of the RIGHT abdominal wall demonstrates a large subcutaneous collection with septation, consistent with known hematoma. The RIGHT abdomen was prepped and draped usual sterile fashion a sterile drape was applied, covering the operative table. Maximum barrier sterile technique with sterile gowns and gloves were used for the procedure. A timeout was performed prior to the initiation of the procedure. Local anesthesia was provided with 1% lidocaine with epinephrine. Under direct ultrasound guidance, an 18 gauge trocar needle was advanced into the collection with the RIGHT abdominal wall. Appropriate positioning was confirmed with the efflux of old blood. An Amplatz super stiff wire was advanced across the collection to the inferior most dependent portion under intermittent fluoroscopic guidance. A 12 Fr "biliary" drainage catheter was selected and additional sideholes were cut proximally, then inserted over wire to the collection. A sample was collected and submitted for microbiological analysis. Several postprocedural spot abdominal radiographs were obtained. The catheter was secured with a 0-Silk suture and connected to gravity drainage bag. Dressings were placed. The patient tolerated procedure well without immediate postprocedural complication. FINDINGS: After successful ultrasound and fluoroscopic guided placement of drainage catheter into RIGHT abdominal wall hematoma. Following drainage catheter placement, approximately 20 mL of hematoma was aspirated and submitted for analysis. IMPRESSION: Successful placement of a 12 Fr drainage  catheter into a RIGHT abdominal wall hematoma, as above. Michaelle Birks, MD [facility] Radiology Electronically Signed   By: Michaelle Birks M.D.   On: 10/18/2021 17:30    Anti-infectives: Anti-infectives (From admission, onward)    None       Assessment/Plan: S/p laparoscopic sleeve gastrectomy, hiatal hernia repair, upper endoscopy 10/08/21 Dr. Kae Heller Post-operative abdominal wall hematoma  ABL anemia  - recheck hgb today, if stable with restart Xarelto - advance to bariatric fulls today - transfer to floor   FEN: fulls, LR @ 75 cc/h VTE: SCDs, restart DOAC if hgb stable ID: no acute issues   Atrial fibrillation RVR  cards following  Hx of DVT in the 1990s HTN Hx of Hyperthyroidism Chronic low back pain Arthritis  Depression Morbid Obesity - BMI 58.89   LOS: 1 day    Rosario Adie [id number]

## 2021-10-19 NOTE — Progress Notes (Signed)
Progress Note  Patient Name: [name] Date of Encounter: 10/19/2021  Primary Cardiologist: Werner Lean, MD  Subjective   Does not report any sense of palpitations or chest pain.  Inpatient Medications    Scheduled Meds:  chlorhexidine  15 mL Mouth Rinse BID   Chlorhexidine Gluconate Cloth  6 each Topical Daily   mouth rinse  15 mL Mouth Rinse q12n4p   metoCLOPramide (REGLAN) injection  10 mg Intravenous Q6H   metoprolol tartrate  12.5 mg Oral BID   Continuous Infusions:  lactated ringers 75 mL/hr at 10/18/21 2252   PRN Meds: acetaminophen **OR** acetaminophen, HYDROmorphone (DILAUDID) injection, iohexol, methocarbamol, metoprolol tartrate, ondansetron **OR** ondansetron (ZOFRAN) IV, oxyCODONE   Vital Signs    Vitals:   10/19/21 0200 10/19/21 0400 10/19/21 0500 10/19/21 0600  BP: 133/71 139/63 (!) 146/74 (!) 141/66  Pulse: 91   (!) 101  Resp: 10 13 15 13   Temp:  98.1 F (36.7 C)    TempSrc:  Oral    SpO2: 97% 96%  98%  Weight:      Height:        Intake/Output Summary (Last 24 hours) at 10/19/2021 0826 Last data filed at 10/19/2021 [id number] Gross per 24 hour  Intake 1692.8 ml  Output 1200 ml  Net 492.8 ml   Filed Weights   10/17/21 0048 10/17/21 1623  Weight: (!) 146.1 kg (!) 145.7 kg    Telemetry    Atrial fibrillation with overall improved heart rate, still episodes of mild RVR.  Personally reviewed.  ECG    An ECG dated 10/17/2021 was personally reviewed today and demonstrated:  Coarse atrial fibrillation with RVR.  Physical Exam   GEN: No acute distress.   Neck: No JVD. Cardiac: Irregularly irregular, no gallop or significant murmur.  Respiratory: Nonlabored. Clear to auscultation bilaterally. GI: Mildly tender, ecchymosis on skin of abdomen. MS: No edema. Neuro:  Nonfocal. Psych: Alert and oriented x 3. Normal affect.  Labs    Chemistry Recent Labs  Lab 10/17/21 0130 10/18/21 0307  NA 132* 136  K 3.8 4.2  CL 103  106  CO2 18* 19*  GLUCOSE 102* 96  BUN 16 16  CREATININE 1.03* 0.87  CALCIUM 8.6* 8.6*  PROT 6.6  --   ALBUMIN 3.4*  --   AST 16  --   ALT 23  --   ALKPHOS 33*  --   BILITOT 1.4*  --   GFRNONAA >60 >60  ANIONGAP 11 11     Hematology Recent Labs  Lab 10/17/21 2028 10/18/21 0307 10/19/21 0741  WBC 16.0* 15.4* 12.2*  RBC 3.37* 3.48* 2.78*  HGB 9.3* 9.7* 7.8*  HCT 28.1* 30.1* 23.3*  MCV 83.4 86.5 83.8  MCH 27.6 27.9 28.1  MCHC 33.1 32.2 33.5  RDW 16.7* 17.1* 17.5*  PLT 223 211 214    Radiology    IR Guided Drain W Catheter Placement  Result Date: 10/18/2021 INDICATION: Abdominal wall hematoma. EXAM: ULTRASOUND AND FLUOROSCOPIC GUIDED PLACEMENT OF DRAINAGE CATHETER INTO A RIGHT ABDOMINAL WALL HEMATOMA COMPARISON:  CT AP, 10/17/2021. MEDICATIONS: None. CONTRAST:  None. ANESTHESIA/SEDATION: Moderate (conscious) sedation was employed during this procedure. A total of Versed 2 mg and Fentanyl 150 mcg was administered intravenously. Moderate Sedation Time: 20 minutes. The patient's level of consciousness and vital signs were monitored continuously by radiology nursing throughout the procedure under my direct supervision. FLUOROSCOPY TIME:  1 minutes 6 seconds (21 mGy) COMPLICATIONS: None immediate. TECHNIQUE: Informed written consent was  obtained from the the patient and/or patient's representative after a discussion of the risks, benefits and alternatives to treatment. Questions regarding the procedure were encouraged and answered. A timeout was performed prior to the initiation of the procedure. Ultrasound scanning of the RIGHT abdominal wall demonstrates a large subcutaneous collection with septation, consistent with known hematoma. The RIGHT abdomen was prepped and draped usual sterile fashion a sterile drape was applied, covering the operative table. Maximum barrier sterile technique with sterile gowns and gloves were used for the procedure. A timeout was performed prior to the  initiation of the procedure. Local anesthesia was provided with 1% lidocaine with epinephrine. Under direct ultrasound guidance, an 18 gauge trocar needle was advanced into the collection with the RIGHT abdominal wall. Appropriate positioning was confirmed with the efflux of old blood. An Amplatz super stiff wire was advanced across the collection to the inferior most dependent portion under intermittent fluoroscopic guidance. A 12 Fr "biliary" drainage catheter was selected and additional sideholes were cut proximally, then inserted over wire to the collection. A sample was collected and submitted for microbiological analysis. Several postprocedural spot abdominal radiographs were obtained. The catheter was secured with a 0-Silk suture and connected to gravity drainage bag. Dressings were placed. The patient tolerated procedure well without immediate postprocedural complication. FINDINGS: After successful ultrasound and fluoroscopic guided placement of drainage catheter into RIGHT abdominal wall hematoma. Following drainage catheter placement, approximately 20 mL of hematoma was aspirated and submitted for analysis. IMPRESSION: Successful placement of a 12 Fr drainage catheter into a RIGHT abdominal wall hematoma, as above. Roanna Banning, MD [facility] Radiology Electronically Signed   By: Roanna Banning M.D.   On: 10/18/2021 17:30    Assessment & Plan    1.  Permanent atrial fibrillation with CHA2DS2-VASc score of 3.  She has had recent RVR in the setting of acute illness, also not on standing beta-blocker.  Lopressor 12.5 mg twice daily was initiated yesterday.  Anticoagulation has been on hold secondary to abdominal wall hematoma status post laparoscopic sleeve gastrectomy and hiatal hernia repair.  2.  Postoperative/blood loss anemia.  Hemoglobin is down to 7.8 from 9.7 yesterday.  Would anticipate continued hold on resuming Xarelto.  Continue Lopressor 12.5  mg twice daily, this could potentially be uptitrated if needed, but for now would hold the course with overall improvement in heart rate.  Anticipate further hold on Xarelto given decrease in hemoglobin in last 24 hours.  Surgical team following.  Signed, Nona Dell, MD  10/19/2021, 8:26 AM

## 2021-10-19 NOTE — Plan of Care (Signed)

## 2021-10-20 LAB — CBC
HCT: 20.1 % — ABNORMAL LOW (ref 36.0–46.0)
HCT: 26.2 % — ABNORMAL LOW (ref 36.0–46.0)
Hemoglobin: 6.5 g/dL — CL (ref 12.0–15.0)
Hemoglobin: 8.5 g/dL — ABNORMAL LOW (ref 12.0–15.0)
MCH: 27.7 pg (ref 26.0–34.0)
MCH: 27.6 pg (ref 26.0–34.0)
MCHC: 32.3 g/dL (ref 30.0–36.0)
MCHC: 32.4 g/dL (ref 30.0–36.0)
MCV: 85.5 fL (ref 80.0–100.0)
MCV: 85.1 fL (ref 80.0–100.0)
Platelets: 266 10*3/uL (ref 150–400)
Platelets: 236 10*3/uL (ref 150–400)
RBC: 2.35 MIL/uL — ABNORMAL LOW (ref 3.87–5.11)
RBC: 3.08 MIL/uL — ABNORMAL LOW (ref 3.87–5.11)
RDW: 18 % — ABNORMAL HIGH (ref 11.5–15.5)
RDW: 17.4 % — ABNORMAL HIGH (ref 11.5–15.5)
WBC: 9.8 10*3/uL (ref 4.0–10.5)
WBC: 12.1 10*3/uL — ABNORMAL HIGH (ref 4.0–10.5)
nRBC: 0.4 % — ABNORMAL HIGH (ref 0.0–0.2)
nRBC: 0.5 % — ABNORMAL HIGH (ref 0.0–0.2)

## 2021-10-20 LAB — PREPARE RBC (CROSSMATCH)

## 2021-10-20 MED ORDER — METOPROLOL TARTRATE 25 MG PO TABS
25.0000 mg | ORAL_TABLET | Freq: Two times a day (BID) | ORAL | Status: DC
  Administered 2021-10-20 – 2021-10-23 (×7): 25 mg via ORAL
  Filled 2021-10-20 (×7): qty 1

## 2021-10-20 MED ORDER — SODIUM CHLORIDE 0.9% IV SOLUTION: Freq: Once | INTRAVENOUS | Status: AC

## 2021-10-20 NOTE — Progress Notes (Signed)
Informed of critical lab value for HGB of 6.5. Dr. Maisie Fus was notified.

## 2021-10-20 NOTE — Progress Notes (Signed)
Progress Note  Patient Name: [name] Date of Encounter: 10/20/2021  Primary Cardiologist: [name], MD  Subjective   Feels weak, no palpitations or chest pain.  Inpatient Medications    Scheduled Meds:  sodium chloride   Intravenous Once   chlorhexidine  15 mL Mouth Rinse BID   Chlorhexidine Gluconate Cloth  6 each Topical Daily   mouth rinse  15 mL Mouth Rinse q12n4p   metoCLOPramide (REGLAN) injection  10 mg Intravenous Q6H   metoprolol tartrate  12.5 mg Oral BID   sodium chloride flush  10 mL Intracatheter Q12H   Continuous Infusions:  lactated ringers 75 mL/hr at 10/20/21 0031   PRN Meds: acetaminophen **OR** acetaminophen, HYDROmorphone (DILAUDID) injection, iohexol, methocarbamol, metoprolol tartrate, ondansetron **OR** ondansetron (ZOFRAN) IV, oxyCODONE   Vital Signs    Vitals:   10/19/21 1012 10/19/21 1427 10/19/21 2139 10/20/21 0600  BP: 139/80 130/75 (!) 143/76 131/69  Pulse: (!) 109 83 75 93  Resp: 16 18 18 20   Temp: 98.7 F (37.1 C) 98.7 F (37.1 C) 99 F (37.2 C) 98.6 F (37 C)  TempSrc: Oral Oral Oral Oral  SpO2: 99% 100% 93%   Weight:      Height:        Intake/Output Summary (Last 24 hours) at 10/20/2021 0939 Last data filed at 10/20/2021 0913 Gross per 24 hour  Intake 2237.54 ml  Output 1601 ml  Net 636.54 ml   Filed Weights   10/17/21 0048 10/17/21 1623  Weight: (!) 146.1 kg (!) 145.7 kg    Telemetry    Bedside cardiac monitor shows atrial fibrillation heart rate 110.  Personally reviewed.  ECG    An ECG dated 10/17/2021 was personally reviewed today and demonstrated:  Coarse atrial fibrillation with RVR.  Physical Exam   GEN: No acute distress.   Neck: No JVD. Cardiac: Irregularly irregular without gallop.  Respiratory: Nonlabored. Clear to auscultation bilaterally. GI: Mildly tender, ecchymosis on skin of abdomen. MS: No edema.  Labs    Chemistry Recent Labs  Lab 10/17/21 0130  10/18/21 0307  NA 132* 136  K 3.8 4.2  CL 103 106  CO2 18* 19*  GLUCOSE 102* 96  BUN 16 16  CREATININE 1.03* 0.87  CALCIUM 8.6* 8.6*  PROT 6.6  --   ALBUMIN 3.4*  --   AST 16  --   ALT 23  --   ALKPHOS 33*  --   BILITOT 1.4*  --   GFRNONAA >60 >60  ANIONGAP 11 11     Hematology Recent Labs  Lab 10/18/21 0307 10/19/21 0741 10/20/21 0441  WBC 15.4* 12.2* 9.8  RBC 3.48* 2.78* 2.35*  HGB 9.7* 7.8* 6.5*  HCT 30.1* 23.3* 20.1*  MCV 86.5 83.8 85.5  MCH 27.9 28.1 27.7  MCHC 32.2 33.5 32.3  RDW 17.1* 17.5* 18.0*  PLT 211 214 266    Radiology    IR Guided Drain W Catheter Placement  Result Date: 10/18/2021 INDICATION: Abdominal wall hematoma. EXAM: ULTRASOUND AND FLUOROSCOPIC GUIDED PLACEMENT OF DRAINAGE CATHETER INTO A RIGHT ABDOMINAL WALL HEMATOMA COMPARISON:  CT AP, 10/17/2021. MEDICATIONS: None. CONTRAST:  None. ANESTHESIA/SEDATION: Moderate (conscious) sedation was employed during this procedure. A total of Versed 2 mg and Fentanyl 150 mcg was administered intravenously. Moderate Sedation Time: 20 minutes. The patient's level of consciousness and vital signs were monitored continuously by radiology nursing throughout the procedure under my direct supervision. FLUOROSCOPY TIME:  1 minutes 6 seconds (21 mGy) COMPLICATIONS: None  immediate. TECHNIQUE: Informed written consent was obtained from the the patient and/or patient's representative after a discussion of the risks, benefits and alternatives to treatment. Questions regarding the procedure were encouraged and answered. A timeout was performed prior to the initiation of the procedure. Ultrasound scanning of the RIGHT abdominal wall demonstrates a large subcutaneous collection with septation, consistent with known hematoma. The RIGHT abdomen was prepped and draped usual sterile fashion a sterile drape was applied, covering the operative table. Maximum barrier sterile technique with sterile gowns and gloves were used for the  procedure. A timeout was performed prior to the initiation of the procedure. Local anesthesia was provided with 1% lidocaine with epinephrine. Under direct ultrasound guidance, an 18 gauge trocar needle was advanced into the collection with the RIGHT abdominal wall. Appropriate positioning was confirmed with the efflux of old blood. An Amplatz super stiff wire was advanced across the collection to the inferior most dependent portion under intermittent fluoroscopic guidance. A 12 Fr "biliary" drainage catheter was selected and additional sideholes were cut proximally, then inserted over wire to the collection. A sample was collected and submitted for microbiological analysis. Several postprocedural spot abdominal radiographs were obtained. The catheter was secured with a 0-Silk suture and connected to gravity drainage bag. Dressings were placed. The patient tolerated procedure well without immediate postprocedural complication. FINDINGS: After successful ultrasound and fluoroscopic guided placement of drainage catheter into RIGHT abdominal wall hematoma. Following drainage catheter placement, approximately 20 mL of hematoma was aspirated and submitted for analysis. IMPRESSION: Successful placement of a 12 Fr drainage catheter into a RIGHT abdominal wall hematoma, as above. Roanna Banning, MD [facility] Radiology Electronically Signed   By: Roanna Banning M.D.   On: 10/18/2021 17:30    Assessment & Plan    1.  Permanent atrial fibrillation with CHA2DS2-VASc score of 3.  She has had recent RVR in the setting of acute illness/anemia.  Currently on Lopressor 12.5 mg twice daily.  Anticoagulation has been on hold secondary to abdominal wall hematoma status post laparoscopic sleeve gastrectomy and hiatal hernia repair.  2.  Postoperative/blood loss anemia.  Hemoglobin is down to 6.5 from 7.8 and being followed by the surgical team.  Xarelto remains on hold.  Increase  Lopressor to 25 mg twice daily.  Continue to hold Xarelto given decrease in hemoglobin in last 24 hours.  Surgical team following.  Signed, Nona Dell, MD  10/20/2021, 9:39 AM

## 2021-10-20 NOTE — Progress Notes (Signed)
Subjective/Chief Complaint: Patient feels fatigued.  Hemoglobin 6.5 this morning  Drain put out 60 of old bloody fluid.   Objective: Vital signs in last 24 hours: Temp:  [98.6 F (37 C)-99 F (37.2 C)] 98.6 F (37 C) (12/25 0600) Pulse Rate:  [41-109] 93 (12/25 0600) Resp:  [16-23] 20 (12/25 0600) BP: (130-153)/(65-80) 131/69 (12/25 0600) SpO2:  [93 %-100 %] 93 % (12/24 2139) Last BM Date: 10/13/21  Intake/Output from previous day: 12/24 0701 - 12/25 0700 In: 2312.5 [P.O.:600; I.V.:1712.5] Out: 1201 [Urine:551; Drains:650] Intake/Output this shift: No intake/output data recorded.  Resp: clear to auscultation bilaterally Cardio: a fib rate controlled  Incision/Wound:large ecchymosis bloody drain output noted hematoma appears stable  port sites stable   Lab Results:  Recent Labs    10/19/21 0741 10/20/21 0441  WBC 12.2* 9.8  HGB 7.8* 6.5*  HCT 23.3* 20.1*  PLT 214 266   BMET Recent Labs    10/18/21 0307  NA 136  K 4.2  CL 106  CO2 19*  GLUCOSE 96  BUN 16  CREATININE 0.87  CALCIUM 8.6*   PT/INR Recent Labs    10/18/21 0307  LABPROT 15.1  INR 1.2   ABG No results for input(s): PHART, HCO3 in the last 72 hours.  Invalid input(s): PCO2, PO2  Studies/Results: IR Guided Drain W Catheter Placement  Result Date: 10/18/2021 INDICATION: Abdominal wall hematoma. EXAM: ULTRASOUND AND FLUOROSCOPIC GUIDED PLACEMENT OF DRAINAGE CATHETER INTO A RIGHT ABDOMINAL WALL HEMATOMA COMPARISON:  CT AP, 10/17/2021. MEDICATIONS: None. CONTRAST:  None. ANESTHESIA/SEDATION: Moderate (conscious) sedation was employed during this procedure. A total of Versed 2 mg and Fentanyl 150 mcg was administered intravenously. Moderate Sedation Time: 20 minutes. The patient's level of consciousness and vital signs were monitored continuously by radiology nursing throughout the procedure under my direct supervision. FLUOROSCOPY TIME:  1 minutes 6 seconds (21 mGy) COMPLICATIONS: None  immediate. TECHNIQUE: Informed written consent was obtained from the the patient and/or patient's representative after a discussion of the risks, benefits and alternatives to treatment. Questions regarding the procedure were encouraged and answered. A timeout was performed prior to the initiation of the procedure. Ultrasound scanning of the RIGHT abdominal wall demonstrates a large subcutaneous collection with septation, consistent with known hematoma. The RIGHT abdomen was prepped and draped usual sterile fashion a sterile drape was applied, covering the operative table. Maximum barrier sterile technique with sterile gowns and gloves were used for the procedure. A timeout was performed prior to the initiation of the procedure. Local anesthesia was provided with 1% lidocaine with epinephrine. Under direct ultrasound guidance, an 18 gauge trocar needle was advanced into the collection with the RIGHT abdominal wall. Appropriate positioning was confirmed with the efflux of old blood. An Amplatz super stiff wire was advanced across the collection to the inferior most dependent portion under intermittent fluoroscopic guidance. A 12 Fr "biliary" drainage catheter was selected and additional sideholes were cut proximally, then inserted over wire to the collection. A sample was collected and submitted for microbiological analysis. Several postprocedural spot abdominal radiographs were obtained. The catheter was secured with a 0-Silk suture and connected to gravity drainage bag. Dressings were placed. The patient tolerated procedure well without immediate postprocedural complication. FINDINGS: After successful ultrasound and fluoroscopic guided placement of drainage catheter into RIGHT abdominal wall hematoma. Following drainage catheter placement, approximately 20 mL of hematoma was aspirated and submitted for analysis. IMPRESSION: Successful placement of a 12 Fr drainage catheter into a RIGHT abdominal wall hematoma, as  above. Roanna Banning, MD [facility] Radiology Electronically Signed   By: Roanna Banning M.D.   On: 10/18/2021 17:30    Anti-infectives: Anti-infectives (From admission, onward)    None       Assessment/Plan: S/p laparoscopic sleeve gastrectomy, hiatal hernia repair, upper endoscopy 10/08/21 Dr. Fredricka Bonine Post-operative abdominal wall hematoma  ABL anemia  - hgb 6.5-  transfuse 1 U PRBC  HOLD ANTICOAGULATION    FEN: fulls, LR @ 75 cc/h VTE: SCDs, restart DOAC if hgb stable ID: no acute issues   Atrial fibrillation RVR  cards following  Hx of DVT in the 1990s HTN Hx of Hyperthyroidism Chronic low back pain Arthritis  Depression Morbid Obesity - BMI 58.89   LOS: 2 days    Dortha Schwalbe MD  10/20/2021

## 2021-10-21 ENCOUNTER — Inpatient Hospital Stay (HOSPITAL_COMMUNITY): Payer: Medicare Other

## 2021-10-21 LAB — BPAM RBC
Blood Product Expiration Date: 202301172359
Blood Product Expiration Date: 202301172359
Blood Product Expiration Date: 202301172359
ISSUE DATE / TIME: 202212221552
ISSUE DATE / TIME: 202212221716
ISSUE DATE / TIME: 202212251006
Unit Type and Rh: 6200
Unit Type and Rh: 6200
Unit Type and Rh: 6200

## 2021-10-21 LAB — TYPE AND SCREEN
ABO/RH(D): A POS
Antibody Screen: NEGATIVE
Unit division: 0
Unit division: 0
Unit division: 0

## 2021-10-21 LAB — CBC
HCT: 23.3 % — ABNORMAL LOW (ref 36.0–46.0)
Hemoglobin: 7.7 g/dL — ABNORMAL LOW (ref 12.0–15.0)
MCH: 28.2 pg (ref 26.0–34.0)
MCHC: 33 g/dL (ref 30.0–36.0)
MCV: 85.3 fL (ref 80.0–100.0)
Platelets: 310 10*3/uL (ref 150–400)
RBC: 2.73 MIL/uL — ABNORMAL LOW (ref 3.87–5.11)
RDW: 17.5 % — ABNORMAL HIGH (ref 11.5–15.5)
WBC: 9.9 10*3/uL (ref 4.0–10.5)
nRBC: 0.5 % — ABNORMAL HIGH (ref 0.0–0.2)

## 2021-10-21 MED ORDER — IOHEXOL 9 MG/ML PO SOLN
ORAL | Status: AC
  Filled 2021-10-21: qty 1000

## 2021-10-21 MED ORDER — IOHEXOL 9 MG/ML PO SOLN: 500.0000 mL | ORAL | Status: AC

## 2021-10-21 MED ORDER — SODIUM CHLORIDE (PF) 0.9 % IJ SOLN
INTRAMUSCULAR | Status: AC
  Filled 2021-10-21: qty 50

## 2021-10-21 MED ORDER — IOHEXOL 350 MG/ML SOLN
80.0000 mL | Freq: Once | INTRAVENOUS | Status: AC | PRN
  Administered 2021-10-21: 12:00:00 100 mL via INTRAVENOUS

## 2021-10-21 MED ORDER — IOHEXOL 9 MG/ML PO SOLN
500.0000 mL | ORAL | Status: AC
  Administered 2021-10-21: 09:00:00 500 mL via ORAL

## 2021-10-21 MED ORDER — IOHEXOL 300 MG/ML  SOLN: 100.0000 mL | Freq: Once | INTRAMUSCULAR | Status: DC | PRN

## 2021-10-21 NOTE — Progress Notes (Addendum)
Referring Physician(s): Phylliss Blakes  Supervising Physician: Richarda Overlie  Patient Status:  [facility] - In-pt  Chief Complaint:  S/p gastric Koch on 12/13, complicated by abdominal wall hematoma/  S/p right abdominal wall drain placement with IR on 12/23   Subjective:  Pt laying in bed, getting VAS [place] on legs.  States that she feels sick in general today, weakness and nausea.  Patient states the right abdomen drain was removed today.   Allergies: Amlodipine besylate, Codeine, Triamcinolone, and Latex  Medications: Prior to Admission medications   Medication Sig Start Date End Date Taking? Authorizing Provider  acetaminophen (TYLENOL) 325 MG tablet Take 650 mg by mouth every 4 (four) hours as needed for moderate pain.   Yes [provider]  albuterol (VENTOLIN HFA) 108 (90 Base) MCG/ACT inhaler Inhale 1-2 puffs into the lungs every 6 (six) hours as needed for shortness of breath or wheezing. 09/26/21  Yes [provider]  amphetamine-dextroamphetamine (ADDERALL XR) 15 MG 24 hr capsule Take 15 mg by mouth 2 (two) times daily. 07/05/21  Yes [provider]  Ascorbic Acid (VITAMIN C GUMMIE PO) Take 2 tablets by mouth daily.   Yes [provider]  buPROPion (WELLBUTRIN XL) 150 MG 24 hr tablet Take 150 mg by mouth daily. for appetite suppression   Yes [provider]  DULoxetine (CYMBALTA) 30 MG capsule Take 60 mg by mouth daily.   Yes [provider]  enoxaparin (LOVENOX) 150 MG/ML injection Inject 1 mL (1 syringe) into the skin every 12 (twelve) hours for 28 days. 10/10/21 11/07/21 Yes Berna Bue, MD  gabapentin (NEURONTIN) 400 MG capsule Take 400 mg by mouth 3 (three) times daily. 01/01/20  Yes [provider]  metoprolol tartrate (LOPRESSOR) 25 MG tablet TAKE 1/2 TABLET(12.5 MG TOTAL) BY MOUTH TWICE DAILY Patient taking differently: Take 12.5 mg by mouth 2 (two) times daily. 03/29/21  Yes Sheilah Pigeon, PA-C   ondansetron (ZOFRAN-ODT) 4 MG disintegrating tablet Dissolve 1 tablet (4 mg total) by mouth every 6 (six) hours as needed for nausea or vomiting. 10/10/21  Yes Berna Bue, MD  pantoprazole (PROTONIX) 40 MG tablet Take 1 tablet (40 mg total) by mouth daily. 10/10/21  Yes Berna Bue, MD  traMADol (ULTRAM) 50 MG tablet Take 1 tablet (50 mg total) by mouth every 6 (six) hours as needed (pain). 10/10/21  Yes Connor, Chelsea A, MD  XARELTO 20 MG TABS tablet TAKE 1 TABLET(20 MG) BY MOUTH DAILY WITH SUPPER Patient not taking: Reported on 10/17/2021 10/03/21   [name], MD     Vital Signs: BP 119/64 (BP Location: Right Arm)    Pulse 83    Temp 98.2 F (36.8 C) (Oral)    Resp 18    Ht 5\' 2"  (1.575 m)    Wt (!) 321 lb 3.4 oz (145.7 kg)    SpO2 96%    BMI 58.75 kg/m   Physical Exam Vitals reviewed.  Constitutional:      General: She is not in acute distress.    Appearance: Normal appearance. She is not ill-appearing.  HENT:     Head: Normocephalic and atraumatic.  Pulmonary:     Effort: Pulmonary effort is normal.  Abdominal:     Palpations: Abdomen is soft.  Skin:    General: Skin is warm and dry.     Coloration: Skin is not jaundiced or pale.     Comments: Dressing on right lateal abdomen. Blood noted on  the dressing as well as dripping on her skin.   Neurological:     Mental Status: She is alert and oriented to person, place, and time.  Psychiatric:        Mood and Affect: Mood normal.        Behavior: Behavior normal.        Judgment: Judgment normal.    Imaging: CT ABDOMEN PELVIS W CONTRAST  Result Date: 10/21/2021 CLINICAL DATA:  Abdominal pain EXAM: CT ABDOMEN AND PELVIS WITH CONTRAST TECHNIQUE: Multidetector CT imaging of the abdomen and pelvis was performed using the standard protocol following bolus administration of intravenous contrast. CONTRAST:  187mL OMNIPAQUE IOHEXOL 350 MG/ML SOLN COMPARISON:  10/17/2021 FINDINGS: Lower chest: No acute  abnormality Hepatobiliary: No focal hepatic abnormality. Gallbladder unremarkable. Pancreas: No focal abnormality or ductal dilatation. Spleen: No focal abnormality.  Normal size. Adrenals/Urinary Tract: Left renal parapelvic cysts. No hydronephrosis. Adrenal glands unremarkable. Urinary bladder decompressed, grossly unremarkable. Stomach/Bowel: Normal appendix. Changes of gastric Koch. Stomach, large and small bowel grossly unremarkable. Vascular/Lymphatic: No evidence of aneurysm or adenopathy. Reproductive: Uterus and adnexa unremarkable.  No mass. Other: No free fluid or free air. Large anterior abdominal wall fluid collection again noted. Pigtail drainage catheter has been placed into the largest fluid collection. This measures 10.4 x 6.9 current leak, compared with 10.1 x 8.3 cm previously. Overall this is not significantly changed. Layering high density material with in the fluid collections suggest layering hematocrit level related to hematoma. Smaller adjacent fluid collections superiorly also unchanged. Musculoskeletal: No acute bony abnormality. IMPRESSION: Anterior abdominal wall fluid collections with layering hematocrit levels most compatible with postoperative hematomas. Pigtail drainage catheter has been placed in the largest fluid collection but is not significantly changed since prior study. No acute intra-abdominal process. Changes of gastric Koch. Electronically Signed   By: Rolm Baptise M.D.   On: 10/21/2021 12:39   IR Guided Niel Hummer W Catheter Placement  Result Date: 10/18/2021 INDICATION: Abdominal wall hematoma. EXAM: ULTRASOUND AND FLUOROSCOPIC GUIDED PLACEMENT OF DRAINAGE CATHETER INTO A RIGHT ABDOMINAL WALL HEMATOMA COMPARISON:  CT AP, 10/17/2021. MEDICATIONS: None. CONTRAST:  None. ANESTHESIA/SEDATION: Moderate (conscious) sedation was employed during this procedure. A total of Versed 2 mg and Fentanyl 150 mcg was administered intravenously. Moderate Sedation Time: 20 minutes. The  patient's level of consciousness and vital signs were monitored continuously by radiology nursing throughout the procedure under my direct supervision. FLUOROSCOPY TIME:  1 minutes 6 seconds (21 mGy) COMPLICATIONS: None immediate. TECHNIQUE: Informed written consent was obtained from the the patient and/or patient's representative after a discussion of the risks, benefits and alternatives to treatment. Questions regarding the procedure were encouraged and answered. A timeout was performed prior to the initiation of the procedure. Ultrasound scanning of the RIGHT abdominal wall demonstrates a large subcutaneous collection with septation, consistent with known hematoma. The RIGHT abdomen was prepped and draped usual sterile fashion a sterile drape was applied, covering the operative table. Maximum barrier sterile technique with sterile gowns and gloves were used for the procedure. A timeout was performed prior to the initiation of the procedure. Local anesthesia was provided with 1% lidocaine with epinephrine. Under direct ultrasound guidance, an 18 gauge trocar needle was advanced into the collection with the RIGHT abdominal wall. Appropriate positioning was confirmed with the efflux of old blood. An Amplatz super stiff wire was advanced across the collection to the inferior most dependent portion under intermittent fluoroscopic guidance. A 12 Fr "biliary" drainage catheter was selected and additional sideholes were cut  proximally, then inserted over wire to the collection. A sample was collected and submitted for microbiological analysis. Several postprocedural spot abdominal radiographs were obtained. The catheter was secured with a 0-Silk suture and connected to gravity drainage bag. Dressings were placed. The patient tolerated procedure well without immediate postprocedural complication. FINDINGS: After successful ultrasound and fluoroscopic guided placement of drainage catheter into RIGHT abdominal wall  hematoma. Following drainage catheter placement, approximately 20 mL of hematoma was aspirated and submitted for analysis. IMPRESSION: Successful placement of a 12 Fr drainage catheter into a RIGHT abdominal wall hematoma, as above. Michaelle Birks, MD [facility] Radiology Electronically Signed   By: Michaelle Birks M.D.   On: 10/18/2021 17:30    Labs:  CBC: Recent Labs    10/19/21 0741 10/20/21 0441 10/20/21 1642 10/21/21 0416  WBC 12.2* 9.8 12.1* 9.9  HGB 7.8* 6.5* 8.5* 7.7*  HCT 23.3* 20.1* 26.2* 23.3*  PLT 214 266 236 310    COAGS: Recent Labs    10/18/21 0307  INR 1.2    BMP: Recent Labs    11/Abigail/22 1111 10/09/21 0419 10/17/21 0130 10/18/21 0307  NA 136 136 132* 136  K 4.6 4.4 3.8 4.2  CL 103 103 103 106  CO2 27 25 18* 19*  GLUCOSE 93 104* 102* 96  BUN 30* 19 16 16   CALCIUM 9.1 8.5* 8.6* 8.6*  CREATININE 1.09* 0.93 1.03* 0.87  GFRNONAA 57* >60 >60 >60    LIVER FUNCTION TESTS: Recent Labs    02/24/21 0311 11/Abigail/22 1111 10/09/21 0419 10/17/21 0130  BILITOT 0.7 0.8 0.7 1.4*  AST 16 20 19 16   ALT 15 18 15 23   ALKPHOS 42 55 40 33*  PROT 6.0* 7.4 6.2* 6.6  ALBUMIN 3.3* 4.0 3.3* 3.4*    Assessment and Plan:  65 y.o. female s/p gastric Koch on [id number] complicated by abdominal wall hematoma, s/p  12 fr "biliary" drain placement on right abdominal wall.    Drain Location: right abdominal wall  Size: Fr size: 12 Fr - "biliary" drain  Date of placement: 12/23  Currently to: unknown, drain was removed by the time of assessment.  24 hour output:  Output by Drain (mL) 10/20/21 0701 - 10/20/21 1900 10/20/21 1901 - 10/21/21 0700 10/21/21 0701 - 10/21/21 1900 10/21/21 1901 - 10/22/21 0700 10/22/21 0701 - 10/22/21 1358  Closed System Drain 1 Anterior;Right Abdomen 12 Fr. 50 35 35 20     Interval imaging/drain manipulation:  12/26: CT AP w: Pigtail drainage catheter has been placed in the largest fluid collection but  is not significantly changed since prior study. 12/27: drain removed by CCS   The drain was removed by CCS today, dressing on right lateral abdomen has moderate amount of blood and also blood dripping on patient's skin.- CCS notified via secure chat.  IR will sign off.  Please call for questions and concerns.    Electronically Signed: Tera Mater, PA-C 10/21/2021, 6:04 PM   I spent a total of 25 Minutes at the the patient's bedside AND on the patient's hospital floor or unit, greater than 50% of which was counseling/coordinating care for right abd wall hematoma drain   This chart was dictated using voice recognition software.  Despite best efforts to proofread,  errors can occur which can change the documentation meaning.

## 2021-10-21 NOTE — Progress Notes (Signed)
Mobility Specialist - Progress Note   10/21/21 1355  Mobility  Activity Ambulated in hall  Level of Assistance Minimal assist, patient does 75% or more  Assistive Device Front wheel walker  Distance Ambulated (ft) 50 ft  Mobility Ambulated with assistance in hallway  Mobility Response Tolerated well  Mobility performed by Mobility specialist  $Mobility charge 1 Mobility   Upon entry pt was reluctant to mobilize, but with some encouragement from family and mobility specialist, pt agreed. Pt required Min A to stand from mobility specialist and family, but once up pt could ambulate on her own. She experienced 1 LOB episode, but regained stability shortly after. Pt ambulated 50 ft with RW and c/o of abdominal pain. Pt returned to room after session and was left sitting up in recliner with call bell at side, family in room, and food tray in front. RN informed of session and pain medication request from pt.   Arliss Journey Mobility Specialist Acute Rehabilitation Services Phone: [phone] 10/21/21, 1:58 PM

## 2021-10-21 NOTE — Progress Notes (Signed)
Progress Note  Patient Name: [name] Date of Encounter: 10/21/2021  Primary Cardiologist: Werner Lean, MD  Subjective   Patient notes that recovery from surgery is harder than she has expected. Despite being listed as her cardiologist I've never had the pleasure of meeting [name].  She is amenable to me being her cardiologist going forward.  Asymptomatic from AF unless rates go high (has had AF for years).  Inpatient Medications    Scheduled Meds:  chlorhexidine  15 mL Mouth Rinse BID   Chlorhexidine Gluconate Cloth  6 each Topical Daily   mouth rinse  15 mL Mouth Rinse q12n4p   metoCLOPramide (REGLAN) injection  10 mg Intravenous Q6H   metoprolol tartrate  25 mg Oral BID   sodium chloride flush  10 mL Intracatheter Q12H   Continuous Infusions:  lactated ringers 75 mL/hr at 10/20/21 1450   PRN Meds: acetaminophen **OR** acetaminophen, HYDROmorphone (DILAUDID) injection, iohexol, methocarbamol, metoprolol tartrate, ondansetron **OR** ondansetron (ZOFRAN) IV, oxyCODONE   Vital Signs    Vitals:   10/20/21 1033 10/20/21 1321 10/20/21 2130 10/21/21 0554  BP: 121/80 107/61 138/76 128/81  Pulse: (!) 108 94 (!) 104 95  Resp: 17 16 17 17   Temp: 98.3 F (36.8 C) 98.9 F (37.2 C) 98.9 F (37.2 C) 98.8 F (37.1 C)  TempSrc: Oral Oral Oral Oral  SpO2: 93% 95% 98% 97%  Weight:      Height:        Intake/Output Summary (Last 24 hours) at 10/21/2021 0744 Last data filed at 10/21/2021 0600 Gross per 24 hour  Intake 2341.7 ml  Output 985 ml  Net 1356.7 ml   Filed Weights   10/17/21 0048 10/17/21 1623  Weight: (!) 146.1 kg (!) 145.7 kg    Telemetry    N/A   Physical Exam   Gen: no distress, morbid obesity Neck: No JVD,  Cardiac: No Rubs or Gallops, IRIR tachycardia +2 radial pulses Respiratory: Clear to auscultation bilaterally, normal effort, normal  respiratory rate GI: Soft, nontender, non-distended  MS: No  edema;  moves all  extremities Integument: Skin feels warm Neuro:  At time of evaluation, alert and oriented to person/place/time/situation  Psych: Normal affect, patient feels weak   Labs    Chemistry Recent Labs  Lab 10/17/21 0130 10/18/21 0307  NA 132* 136  K 3.8 4.2  CL 103 106  CO2 18* 19*  GLUCOSE 102* 96  BUN 16 16  CREATININE 1.03* 0.87  CALCIUM 8.6* 8.6*  PROT 6.6  --   ALBUMIN 3.4*  --   AST 16  --   ALT 23  --   ALKPHOS 33*  --   BILITOT 1.4*  --   GFRNONAA >60 >60  ANIONGAP 11 11     Hematology Recent Labs  Lab 10/20/21 0441 10/20/21 1642 10/21/21 0416  WBC 9.8 12.1* 9.9  RBC 2.35* 3.08* 2.73*  HGB 6.5* 8.5* 7.7*  HCT 20.1* 26.2* 23.3*  MCV 85.5 85.1 85.3  MCH 27.7 27.6 28.2  MCHC 32.3 32.4 33.0  RDW 18.0* 17.4* 17.5*  PLT 266 236 310    Radiology    No results found.  Assessment & Plan    Permanent atrial fibrillation CHADSVASC 2 (HTN, F) Complicated by post operative anemia Morbid Obesity s/p surgery OSA on CPAP Aortic atherosclerosis and HTN - Hgb drop form 8.5-> 7.7  - rate control improved metoprolol 25 mg PO BID - continue to hold AC untl hemoglobin is stable (we  review her yearly stroke risk in the setting of her acute anemia) - BP controlled well presently - outpatient risk factor modification  Signed, [name], MD  10/21/2021, 7:44 AM

## 2021-10-21 NOTE — Progress Notes (Signed)
° °  Subjective/Chief Complaint: Patient states that her left side feels like it might be swelling No abdominal binder in place Received one unit PRBC yesterday - hgb from 6.5 to 8.5, now down to 7.7. No anticoagulation at present   Objective: Vital signs in last 24 hours: Temp:  [98.3 F (36.8 C)-98.9 F (37.2 C)] 98.8 F (37.1 C) (12/26 0554) Pulse Rate:  [94-108] 95 (12/26 0554) Resp:  [16-18] 17 (12/26 0554) BP: (107-138)/(61-81) 128/81 (12/26 0554) SpO2:  [93 %-98 %] 97 % (12/26 0554) Last BM Date: 10/13/21  Intake/Output from previous day: 12/25 0701 - 12/26 0700 In: 2341.7 [P.O.:600; I.V.:1431.7; Blood:310] Out: 985 [Urine:900; Drains:85] Intake/Output this shift: No intake/output data recorded.  Obese, NAD Abd - large ecchymosis and firmness across entire right side extending across LLQ.   Incisions c/d/i  Lab Results:  Recent Labs    10/20/21 1642 10/21/21 0416  WBC 12.1* 9.9  HGB 8.5* 7.7*  HCT 26.2* 23.3*  PLT 236 310   BMET No results for input(s): NA, K, CL, CO2, GLUCOSE, BUN, CREATININE, CALCIUM in the last 72 hours. PT/INR No results for input(s): LABPROT, INR in the last 72 hours. ABG No results for input(s): PHART, HCO3 in the last 72 hours.  Invalid input(s): PCO2, PO2  Studies/Results: No results found.  Anti-infectives: Anti-infectives (From admission, onward)    None       Assessment/Plan: S/p laparoscopic sleeve gastrectomy, hiatal hernia repair, upper endoscopy 10/08/21 Dr. Fredricka Bonine Post-operative abdominal wall hematoma  ABL anemia  - Hgb continues to decrease - ABDOMINAL BINDER FOR COMPRESSION - was ordered on admission.  I spoke with nursing today again about this. -HOLD ANTICOAGULATION  - Repeat CT scan today with IV contrast - if signs of ongoing bleeding, may need surgical intervention   FEN: fulls, LR @ 75 cc/h; NPO p MN in case she needs surgery tomorrow VTE: SCDs, restart DOAC if hgb stable ID: no acute issues    Atrial fibrillation RVR  cards following  Hx of DVT in the 1990s HTN Hx of Hyperthyroidism Chronic low back pain Arthritis  Depression Morbid Obesity - BMI 58.89  LOS: 3 days    Wynona Luna 10/21/2021

## 2021-10-21 NOTE — Progress Notes (Signed)
°  Transition of Care [facility]) Screening Note   Patient Details  Name: [name] Date of Birth: 12-12-1955   Transition of Care [facility]) CM/SW Contact:    Amada Jupiter, LCSW Phone Number: 10/21/2021, 2:01 PM    Transition of Care Department [facility]) has reviewed patient and no TOC needs have been identified at this time. We will continue to monitor patient advancement through interdisciplinary progression rounds. If new patient transition needs arise, please place a TOC consult.   Evalynne Locurto, LCSW

## 2021-10-22 ENCOUNTER — Ambulatory Visit: Payer: Medicare Other | Admitting: Skilled Nursing Facility1

## 2021-10-22 ENCOUNTER — Telehealth: Payer: Self-pay | Admitting: Skilled Nursing Facility1

## 2021-10-22 ENCOUNTER — Inpatient Hospital Stay (HOSPITAL_COMMUNITY): Payer: Medicare Other

## 2021-10-22 DIAGNOSIS — M7989 Other specified soft tissue disorders: Secondary | ICD-10-CM

## 2021-10-22 LAB — CBC
HCT: 23 % — ABNORMAL LOW (ref 36.0–46.0)
Hemoglobin: 7.4 g/dL — ABNORMAL LOW (ref 12.0–15.0)
MCH: 27.4 pg (ref 26.0–34.0)
MCHC: 32.2 g/dL (ref 30.0–36.0)
MCV: 85.2 fL (ref 80.0–100.0)
Platelets: 365 10*3/uL (ref 150–400)
RBC: 2.7 MIL/uL — ABNORMAL LOW (ref 3.87–5.11)
RDW: 17.4 % — ABNORMAL HIGH (ref 11.5–15.5)
WBC: 9.7 10*3/uL (ref 4.0–10.5)
nRBC: 0.5 % — ABNORMAL HIGH (ref 0.0–0.2)

## 2021-10-22 LAB — URINALYSIS, ROUTINE W REFLEX MICROSCOPIC
Bacteria, UA: NONE SEEN
Glucose, UA: NEGATIVE mg/dL
Hgb urine dipstick: NEGATIVE
Ketones, ur: 80 mg/dL — AB
Leukocytes,Ua: NEGATIVE
Nitrite: NEGATIVE
Protein, ur: 30 mg/dL — AB
Specific Gravity, Urine: 1.019 (ref 1.005–1.030)
pH: 5 (ref 5.0–8.0)

## 2021-10-22 MED ORDER — OXYCODONE HCL 5 MG PO TABS
5.0000 mg | ORAL_TABLET | ORAL | Status: DC | PRN
  Filled 2021-10-22: qty 2

## 2021-10-22 MED ORDER — ADVANCED MULTI EA PO CHEW
2.0000 | CHEWABLE_TABLET | Freq: Every day | ORAL | Status: DC
  Administered 2021-10-23: 08:00:00 2 via ORAL
  Filled 2021-10-22 (×2): qty 2

## 2021-10-22 MED ORDER — HYDROMORPHONE HCL 1 MG/ML IJ SOLN
0.5000 mg | INTRAMUSCULAR | Status: DC | PRN
  Administered 2021-10-22 – 2021-10-23 (×3): 0.5 mg via INTRAVENOUS
  Filled 2021-10-22 (×3): qty 0.5

## 2021-10-22 MED ORDER — PROCHLORPERAZINE EDISYLATE 10 MG/2ML IJ SOLN
10.0000 mg | Freq: Four times a day (QID) | INTRAMUSCULAR | Status: DC | PRN
  Administered 2021-10-22: 19:00:00 10 mg via INTRAVENOUS
  Filled 2021-10-22 (×2): qty 2

## 2021-10-22 MED ORDER — METHOCARBAMOL 500 MG PO TABS
500.0000 mg | ORAL_TABLET | Freq: Four times a day (QID) | ORAL | Status: DC
  Administered 2021-10-22: 22:00:00 500 mg via ORAL
  Filled 2021-10-22 (×3): qty 1

## 2021-10-22 MED ORDER — OXYCODONE HCL 5 MG PO TABS
5.0000 mg | ORAL_TABLET | ORAL | Status: DC | PRN
Start: 2021-10-22 — End: 2021-10-22

## 2021-10-22 MED ORDER — ACETAMINOPHEN 500 MG PO TABS
1000.0000 mg | ORAL_TABLET | Freq: Four times a day (QID) | ORAL | Status: DC
  Filled 2021-10-22 (×3): qty 2

## 2021-10-22 MED ORDER — RIVAROXABAN 10 MG PO TABS
20.0000 mg | ORAL_TABLET | Freq: Every day | ORAL | Status: DC
  Administered 2021-10-22: 19:00:00 20 mg via ORAL
  Filled 2021-10-22: qty 2

## 2021-10-22 MED ORDER — PROSOURCE PLUS PO LIQD
30.0000 mL | Freq: Three times a day (TID) | ORAL | Status: DC
  Filled 2021-10-22: qty 30

## 2021-10-22 NOTE — Progress Notes (Signed)
Alerted by RN and cardiologist that patient started complaining of RLE pain and swelling after getting up from her chair this morning when I saw her.  She states she has had a superficial VTE in the past and is concerned that this is acutely present.   PE: RLE edema focally just inferior to her posterior knee as well as some brawny skin changes.  Not overly erythematous, but different than surrounding skin colors.  A/P: Will obtain a venous duplex of her RLE to further evaluate for clot or not.  Still awaiting UA to determine if we can restart Xarelto, especially if she does have a DVT.  [name] 1:37 PM 10/22/2021

## 2021-10-22 NOTE — Progress Notes (Addendum)
Initial Nutrition Assessment  DOCUMENTATION CODES:   Obesity unspecified  INTERVENTION:   -Prosource Plus PO TID, each provides 100 kcals and 15g protein  -Provided "Phase 3" handout, reviewed soft protein options with patient. Reviewed what we have at the hospital. Reviewed chewing and separation of liquids and solids.  -Recommend resuming bariatric specific MVI and calcium TID **Bariatric Advantage being added by Pharmacy -ok'd by Surgery  -Needs updated weight  NUTRITION DIAGNOSIS:   Inadequate oral intake related to nausea as evidenced by per patient/family report.  GOAL:   Patient will meet greater than or equal to 90% of their needs  MONITOR:   PO intake, Supplement acceptance, Weight trends, I & O's, Labs, Skin  REASON FOR ASSESSMENT:   Consult Assessment of nutrition requirement/status, Diet education  ASSESSMENT:   65 year old female who recently underwent laparoscopic sleeve gastrectomy and hiatal hernia repair on 10/08/21 by Dr. Fredricka Bonine. She was discharged from the hospital 12/15 in stable condition.Patient reports that Sunday she started noting some swelling and bruising at her right subcostal incision, she was evaluated by a PA in the office Monday and told to monitor for increased swelling or bruising. Yesterday patient and her sister noted significantly more swelling than earlier in the week with increased bruising of abdominal wall. Patient with increased abdominal pain, nausea, lightheadedness, fatigue and SOB. Admitted with post-operative abdominal wall hematoma   12/23: s/p DRAINAGE CATHETER PLACEMENT INTO ABDOMINAL WALL HEMATOMA  Patient in room, sitting in chair. Pt reports her intakes have been "so-so". She has not been able to take in PO consistently d/t diet changes to NPO. She is now on bariatric advanced diet but was never able to attend her diet advancement class so she doesn't know what to focus on as far as diet.   At home, pt was struggling to  get her fluids in. Was having nausea and a feeling of fluids just sitting in her stomach. Does not tolerate milk based supplements. States at most she was taking in a bowl of broth (8 oz) and a 28 oz bottle of Gatorade Zero (for a total of 36 oz daily). No protein supplements. Was taking her MVI and only 1 Calcium daily PTA. Has [employer] MVI at home and does not want to continue this as she doesn't like the taste. Wants to switch to Bariatric Advantage EA, checking with Pharmacy to see if we can provide inpatient, if not will provide regular MVI x 2.   Weight prior to surgery 12/9: 321 lbs Recorded weight from 12/22: the exact same as weight from 12/9. Suspect this is copied as pt has had very little PO x 2 weeks. Fluid?  I/Os: +5.8L since admit Drain: 55 ml UOP: 1750 ml x 24 hrs  Medications: Reglan, IV Zofran  Labs reviewed.  NUTRITION - FOCUSED PHYSICAL EXAM:  No depletions noted.  Diet Order:   Diet Order             Diet bariatric advanced Room service appropriate? Yes; Fluid consistency: Thin  Diet effective now                   EDUCATION NEEDS:   Education needs have been addressed  Skin:  Skin Assessment: Skin Integrity Issues: Skin Integrity Issues:: Other (Comment), Incisions Incisions: 12/13 abdomen Other: MASD of sacrum  Last BM:  12/18  Height:   Ht Readings from Last 1 Encounters:  10/17/21 5\' 2"  (1.575 m)    Weight:   Wt Readings  from Last 1 Encounters:  10/17/21 (!) 145.7 kg    BMI:  Body mass index is 58.75 kg/m.  Estimated Nutritional Needs:   Kcal:  1600-1800 (Post-op goal is more ~1000-1200)  Protein:  75-85g  Fluid:  2L/day  Tilda Franco, MS, RD, LDN Inpatient Clinical Dietitian Contact information available via Amion

## 2021-10-22 NOTE — Progress Notes (Signed)
Right lower extremity venous duplex has been completed. Preliminary results can be found in CV Proc through chart review.   10/22/21 2:04 PM Olen Cordial RVT

## 2021-10-22 NOTE — Progress Notes (Signed)
Subjective/Chief Complaint: C/o hematuria since I/O cath in ED on admission.  Wondering if this is why her hgb may be trending down some as well.  Hungry.  Not wearing her binder as she states it makes her abdomen hurt worse.   Objective: Vital signs in last 24 hours: Temp:  [98.2 F (36.8 C)-98.7 F (37.1 C)] 98.7 F (37.1 C) (12/27 0557) Pulse Rate:  [83-109] 109 (12/27 0557) Resp:  [18] 18 (12/27 0557) BP: (119-141)/(64-74) 141/74 (12/27 0557) SpO2:  [94 %-97 %] 94 % (12/27 0557) Last BM Date: 10/13/21  Intake/Output from previous day: 12/26 0701 - 12/27 0700 In: 2381.4 [P.O.:600; I.V.:1781.4] Out: 1605 [Urine:1550; Drains:55] Intake/Output this shift: Total I/O In: -  Out: 200 [Urine:200]  Obese, NAD Abd - large ecchymosis and firmness across entire right side extending across LLQ.  Drain removed with with no issues as it is mostly clot and not draining much of anything.  Incisions c/d/i  Lab Results:  Recent Labs    10/21/21 0416 10/22/21 0412  WBC 9.9 9.7  HGB 7.7* 7.4*  HCT 23.3* 23.0*  PLT 310 365   BMET No results for input(s): NA, K, CL, CO2, GLUCOSE, BUN, CREATININE, CALCIUM in the last 72 hours. PT/INR No results for input(s): LABPROT, INR in the last 72 hours. ABG No results for input(s): PHART, HCO3 in the last 72 hours.  Invalid input(s): PCO2, PO2  Studies/Results: CT ABDOMEN PELVIS W CONTRAST  Result Date: 10/21/2021 CLINICAL DATA:  Abdominal pain EXAM: CT ABDOMEN AND PELVIS WITH CONTRAST TECHNIQUE: Multidetector CT imaging of the abdomen and pelvis was performed using the standard protocol following bolus administration of intravenous contrast. CONTRAST:  OMNIPAQUE IOHEXOL 350 MG/ML SOLN COMPARISON:  10/17/2021 FINDINGS: Lower chest: No acute abnormality Hepatobiliary: No focal hepatic abnormality. Gallbladder unremarkable. Pancreas: No focal abnormality or ductal dilatation. Spleen: No focal abnormality.  Normal size. Adrenals/Urinary  Tract: Left renal parapelvic cysts. No hydronephrosis. Adrenal glands unremarkable. Urinary bladder decompressed, grossly unremarkable. Stomach/Bowel: Normal appendix. Changes of gastric sleeve. Stomach, large and small bowel grossly unremarkable. Vascular/Lymphatic: No evidence of aneurysm or adenopathy. Reproductive: Uterus and adnexa unremarkable.  No mass. Other: No free fluid or free air. Large anterior abdominal wall fluid collection again noted. Pigtail drainage catheter has been placed into the largest fluid collection. This measures 10.4 x 6.9 current leak, compared with 10.1 x 8.3 cm previously. Overall this is not significantly changed. Layering high density material with in the fluid collections suggest layering hematocrit level related to hematoma. Smaller adjacent fluid collections superiorly also unchanged. Musculoskeletal: No acute bony abnormality. IMPRESSION: Anterior abdominal wall fluid collections with layering hematocrit levels most compatible with postoperative hematomas. Pigtail drainage catheter has been placed in the largest fluid collection but is not significantly changed since prior study. No acute intra-abdominal process. Changes of gastric sleeve. Electronically Signed   By: Charlett Nose M.D.   On: 10/21/2021 12:39    Anti-infectives: Anti-infectives (From admission, onward)    None       Assessment/Plan: S/p laparoscopic sleeve gastrectomy, hiatal hernia repair, upper endoscopy 10/08/21 Dr. Fredricka Bonine Post-operative abdominal wall hematoma  ABL anemia  - Hgb continues to decrease, slightly.  7.7 to 7.4.  CT scan shows stable subq hematoma. - ABDOMINAL BINDER FOR COMPRESSION - was ordered on admission.  Patient not wearing as she states it makes her pain worse. -hold anticoagulation, but if UA ok, may restart today. -will check UA to see if she is having a lot of bloody  output in her urine.  If she is, will continue to hold xarelto, if not may resume and check CBC in am    FEN: bari advanced VTE: SCDs, restart DOAC when hgb stable ID: no acute issues   Atrial fibrillation RVR  cards following  Hx of DVT in the 1990s HTN Hx of Hyperthyroidism Chronic low back pain Arthritis  Depression Morbid Obesity - BMI 58.89  LOS: 4 days    Henreitta Cea 10/22/2021

## 2021-10-22 NOTE — Telephone Encounter (Signed)
Returned pt's call. LVM.

## 2021-10-22 NOTE — Progress Notes (Addendum)
Progress Note  Patient Name: [name] Date of Encounter: 10/22/2021  [facility] Cardiologist: Werner Lean, MD  Primary EP:  Dr. Rayann Heman   Subjective   No chest pain, no SOB, sitting up in chair.  Inpatient Medications    Scheduled Meds:  chlorhexidine  15 mL Mouth Rinse BID   Chlorhexidine Gluconate Cloth  6 each Topical Daily   mouth rinse  15 mL Mouth Rinse q12n4p   metoCLOPramide (REGLAN) injection  10 mg Intravenous Q6H   metoprolol tartrate  25 mg Oral BID   sodium chloride flush  10 mL Intracatheter Q12H   Continuous Infusions:  lactated ringers 75 mL/hr at 10/22/21 0601   PRN Meds: acetaminophen **OR** acetaminophen, HYDROmorphone (DILAUDID) injection, iohexol, methocarbamol, metoprolol tartrate, ondansetron **OR** ondansetron (ZOFRAN) IV, oxyCODONE   Vital Signs    Vitals:   10/21/21 0554 10/21/21 1425 10/21/21 2128 10/22/21 0557  BP: 128/81 119/64 127/74 (!) 141/74  Pulse: 95 83 86 (!) 109  Resp: 17 18 18 18   Temp: 98.8 F (37.1 C) 98.2 F (36.8 C) 98.5 F (36.9 C) 98.7 F (37.1 C)  TempSrc: Oral Oral Oral Oral  SpO2: 97% 96% 97% 94%  Weight:      Height:        Intake/Output Summary (Last 24 hours) at 10/22/2021 [id number] Last data filed at 10/22/2021 0601 Gross per 24 hour  Intake 2381.37 ml  Output 1605 ml  Net 776.37 ml   Last 3 Weights 10/18/2021 10/17/2021 10/17/2021  Weight (lbs) (No Data) (No Data) 321 lb 3.4 oz  Weight (kg) (No Data) (No Data) 145.7 kg      Telemetry    Atrial fib rate controlled - Personally Reviewed  ECG    No new - Personally Reviewed  Physical Exam   GEN: No acute distress.   Neck: No JVD Cardiac: irreg irreg, no murmurs, rubs, or gallops.  Respiratory: Clear to auscultation bilaterally. GI: Soft, nontender, non-distended +ecchymosis and dressing RUQ with some bleeding on dressing. MS: No edema; No deformity. Neuro:  Nonfocal  Psych: Normal affect   Labs    High Sensitivity  Troponin:  No results for input(s): TROPONINIHS in the last 720 hours.   Chemistry Recent Labs  Lab 10/17/21 0130 10/18/21 0307  NA 132* 136  K 3.8 4.2  CL 103 106  CO2 18* 19*  GLUCOSE 102* 96  BUN 16 16  CREATININE 1.03* 0.87  CALCIUM 8.6* 8.6*  PROT 6.6  --   ALBUMIN 3.4*  --   AST 16  --   ALT 23  --   ALKPHOS 33*  --   BILITOT 1.4*  --   GFRNONAA >60 >60  ANIONGAP 11 11    Lipids No results for input(s): CHOL, TRIG, HDL, LABVLDL, LDLCALC, CHOLHDL in the last 168 hours.  Hematology Recent Labs  Lab 10/20/21 1642 10/21/21 0416 10/22/21 0412  WBC 12.1* 9.9 9.7  RBC 3.08* 2.73* 2.70*  HGB 8.5* 7.7* 7.4*  HCT 26.2* 23.3* 23.0*  MCV 85.1 85.3 85.2  MCH 27.6 28.2 27.4  MCHC 32.4 33.0 32.2  RDW 17.4* 17.5* 17.4*  PLT 236 310 365   Thyroid  Recent Labs  Lab 10/17/21 0041  TSH 3.083    BNPNo results for input(s): BNP, PROBNP in the last 168 hours.  DDimer No results for input(s): DDIMER in the last 168 hours.   Radiology    CT ABDOMEN PELVIS W CONTRAST  Result Date: 10/21/2021 CLINICAL DATA:  Abdominal  pain EXAM: CT ABDOMEN AND PELVIS WITH CONTRAST TECHNIQUE: Multidetector CT imaging of the abdomen and pelvis was performed using the standard protocol following bolus administration of intravenous contrast. CONTRAST:  OMNIPAQUE IOHEXOL 350 MG/ML SOLN COMPARISON:  10/17/2021 FINDINGS: Lower chest: No acute abnormality Hepatobiliary: No focal hepatic abnormality. Gallbladder unremarkable. Pancreas: No focal abnormality or ductal dilatation. Spleen: No focal abnormality.  Normal size. Adrenals/Urinary Tract: Left renal parapelvic cysts. No hydronephrosis. Adrenal glands unremarkable. Urinary bladder decompressed, grossly unremarkable. Stomach/Bowel: Normal appendix. Changes of gastric sleeve. Stomach, large and small bowel grossly unremarkable. Vascular/Lymphatic: No evidence of aneurysm or adenopathy. Reproductive: Uterus and adnexa unremarkable.  No mass. Other: No  free fluid or free air. Large anterior abdominal wall fluid collection again noted. Pigtail drainage catheter has been placed into the largest fluid collection. This measures 10.4 x 6.9 current leak, compared with 10.1 x 8.3 cm previously. Overall this is not significantly changed. Layering high density material with in the fluid collections suggest layering hematocrit level related to hematoma. Smaller adjacent fluid collections superiorly also unchanged. Musculoskeletal: No acute bony abnormality. IMPRESSION: Anterior abdominal wall fluid collections with layering hematocrit levels most compatible with postoperative hematomas. Pigtail drainage catheter has been placed in the largest fluid collection but is not significantly changed since prior study. No acute intra-abdominal process. Changes of gastric sleeve. Electronically Signed   By: Charlett Nose M.D.   On: 10/21/2021 12:39    Cardiac Studies   Last echo 01/2020 with EF 55-60% mild to mod MR, aortic dilatation of 42 mm.   Last Nuc 01/2020 study normal, low risk study.  Patient Profile     65 y.o. female with a hx of longstanding persistent a fib, HTN, h/o DVT, hyperthyroidism, OSA, obesity being followed by Cardiology post laparoscopic sleeve gastrectomy on 10/08/2021 followed by acute bleed in surgical area (abdominal wall hematoma) and atrial fib with RVR.   Assessment & Plan   1.  Permanent atrial fibrillation with CHA2DS2-VASc score of 3.  She has had recent RVR in the setting of acute illness/anemia.  Was on Lopressor 12.5 mg twice daily and increased to 25 BID.  Anticoagulation has been on hold secondary to abdominal wall hematoma status post laparoscopic sleeve gastrectomy and hiatal hernia repair.  HR is stable 80s overall.    2.  Postoperative/blood loss anemia.  Hemoglobin is down to 6.5 from 7.8 though post transfusion X 3 units total Hgb today is 7.4 and being followed by the surgical team.  Xarelto remains on hold.        For  questions or updates, please contact [facility] Please consult [email or web address] for contact info under        Signed, Nada Boozer, NP  10/22/2021, 8:08 AM    Attending Note:   The patient was seen and examined.  Agree with assessment and plan as noted above.  Changes made to the above note as needed.  Patient seen and independently examined with Nada Boozer, NP .   We discussed all aspects of the encounter. I agree with the assessment and plan as stated above.     Atrial fib:   HR is well controlled.   Waiting on surgery to clear her to restart her Xarelto.     2.  Leg tenderness:   she is concerned that she has developed superficial thrombophlebitis.   Further management per primary team .    I have spent a total of 40 minutes with patient reviewing hospital  notes , telemetry,  EKGs, labs and examining patient as well as establishing an assessment and plan that was discussed with the patient.  > 50% of time was spent in direct patient care.     Thayer Headings, Brooke Bonito., MD, [facility] 10/22/2021, 11:45 AM [address]  [place] Pager [phone]

## 2021-10-23 DIAGNOSIS — I4819 Other persistent atrial fibrillation: Secondary | ICD-10-CM

## 2021-10-23 LAB — CBC
HCT: 25.6 % — ABNORMAL LOW (ref 36.0–46.0)
Hemoglobin: 8.3 g/dL — ABNORMAL LOW (ref 12.0–15.0)
MCH: 27.9 pg (ref 26.0–34.0)
MCHC: 32.4 g/dL (ref 30.0–36.0)
MCV: 85.9 fL (ref 80.0–100.0)
Platelets: 481 10*3/uL — ABNORMAL HIGH (ref 150–400)
RBC: 2.98 MIL/uL — ABNORMAL LOW (ref 3.87–5.11)
RDW: 17.8 % — ABNORMAL HIGH (ref 11.5–15.5)
WBC: 10.6 10*3/uL — ABNORMAL HIGH (ref 4.0–10.5)
nRBC: 0.6 % — ABNORMAL HIGH (ref 0.0–0.2)

## 2021-10-23 LAB — AEROBIC/ANAEROBIC CULTURE W GRAM STAIN (SURGICAL/DEEP WOUND): Culture: NO GROWTH

## 2021-10-23 MED ORDER — ALBUTEROL SULFATE (2.5 MG/3ML) 0.083% IN NEBU: 2.5000 mg | INHALATION_SOLUTION | Freq: Four times a day (QID) | RESPIRATORY_TRACT | Status: DC | PRN

## 2021-10-23 MED ORDER — ADVANCED MULTI EA PO CHEW: 2.0000 | CHEWABLE_TABLET | Freq: Every day | ORAL | 0 refills | Status: AC

## 2021-10-23 MED ORDER — GABAPENTIN 400 MG PO CAPS
400.0000 mg | ORAL_CAPSULE | Freq: Three times a day (TID) | ORAL | Status: DC
  Administered 2021-10-23: 09:00:00 400 mg via ORAL
  Filled 2021-10-23: qty 1

## 2021-10-23 MED ORDER — OXYCODONE HCL 5 MG PO TABS: 5.0000 mg | ORAL_TABLET | ORAL | Status: DC | PRN

## 2021-10-23 MED ORDER — TRAMADOL HCL 50 MG PO TABS: 50.0000 mg | ORAL_TABLET | Freq: Four times a day (QID) | ORAL | 0 refills | Status: DC | PRN

## 2021-10-23 MED ORDER — LIP MEDEX EX OINT
TOPICAL_OINTMENT | CUTANEOUS | Status: AC
  Filled 2021-10-23: qty 7

## 2021-10-23 MED ORDER — HYDROMORPHONE HCL 1 MG/ML IJ SOLN: 0.5000 mg | Freq: Four times a day (QID) | INTRAMUSCULAR | Status: DC | PRN

## 2021-10-23 MED ORDER — ONDANSETRON 4 MG PO TBDP
4.0000 mg | ORAL_TABLET | Freq: Four times a day (QID) | ORAL | Status: DC
  Administered 2021-10-23: 10:00:00 4 mg via ORAL
  Filled 2021-10-23: qty 1

## 2021-10-23 MED ORDER — BUPROPION HCL ER (XL) 150 MG PO TB24: 150.0000 mg | ORAL_TABLET | Freq: Every day | ORAL | Status: DC

## 2021-10-23 MED ORDER — ONDANSETRON 4 MG PO TBDP: 4.0000 mg | ORAL_TABLET | Freq: Four times a day (QID) | ORAL | Status: DC | PRN

## 2021-10-23 MED ORDER — ACETAMINOPHEN 500 MG PO TABS: 1000.0000 mg | ORAL_TABLET | Freq: Four times a day (QID) | ORAL | Status: AC | PRN

## 2021-10-23 MED ORDER — DULOXETINE HCL 60 MG PO CPEP
60.0000 mg | ORAL_CAPSULE | Freq: Every day | ORAL | Status: DC
  Administered 2021-10-23: 09:00:00 60 mg via ORAL
  Filled 2021-10-23: qty 1

## 2021-10-23 MED ORDER — AMPHETAMINE-DEXTROAMPHET ER 15 MG PO CP24: 15.0000 mg | ORAL_CAPSULE | Freq: Two times a day (BID) | ORAL | Status: DC

## 2021-10-23 MED ORDER — ONDANSETRON 4 MG PO TBDP: 4.0000 mg | ORAL_TABLET | Freq: Four times a day (QID) | ORAL | 0 refills | Status: DC | PRN

## 2021-10-23 MED ORDER — LOPERAMIDE HCL 2 MG PO CAPS
2.0000 mg | ORAL_CAPSULE | ORAL | Status: DC | PRN
  Administered 2021-10-23: 14:00:00 2 mg via ORAL
  Filled 2021-10-23: qty 1

## 2021-10-23 MED ORDER — TRAMADOL HCL 50 MG PO TABS
50.0000 mg | ORAL_TABLET | Freq: Four times a day (QID) | ORAL | Status: DC | PRN
Start: 2021-10-23 — End: 2021-10-23
  Administered 2021-10-23: 14:00:00 50 mg via ORAL
  Filled 2021-10-23: qty 2

## 2021-10-23 MED ORDER — PANTOPRAZOLE SODIUM 40 MG PO TBEC
40.0000 mg | DELAYED_RELEASE_TABLET | Freq: Every day | ORAL | Status: DC
  Administered 2021-10-23: 09:00:00 40 mg via ORAL
  Filled 2021-10-23: qty 1

## 2021-10-23 NOTE — Progress Notes (Addendum)
Progress Note  Patient Name: [name] Date of Encounter: 10/23/2021  [facility] Cardiologist: Werner Lean, MD   Subjective   No chest pain or SOB.  Planning on going home today   Inpatient Medications    Scheduled Meds:  (feeding supplement) PROSource Plus  30 mL Oral TID BM   acetaminophen  1,000 mg Oral Q6H   bariatric multiple vitamin  2 tablet Oral Daily   chlorhexidine  15 mL Mouth Rinse BID   Chlorhexidine Gluconate Cloth  6 each Topical Daily   DULoxetine  60 mg Oral Daily   gabapentin  400 mg Oral TID   mouth rinse  15 mL Mouth Rinse q12n4p   methocarbamol  500 mg Oral QID   metoprolol tartrate  25 mg Oral BID   ondansetron  4 mg Oral Q6H   pantoprazole  40 mg Oral Daily   rivaroxaban  20 mg Oral Q supper   sodium chloride flush  10 mL Intracatheter Q12H   Continuous Infusions:  PRN Meds: albuterol, HYDROmorphone (DILAUDID) injection, iohexol, metoprolol tartrate, prochlorperazine, traMADol   Vital Signs    Vitals:   10/22/21 0557 10/22/21 1425 10/22/21 2114 10/23/21 0449  BP: (!) 141/74 140/84 135/86 134/79  Pulse: (!) 109 97 100 91  Resp: _0 Temp: 98.7 F (37.1 C) 98.8 F (37.1 C) 99.5 F (37.5 C) 99 F (37.2 C)  TempSrc: Oral Oral Oral Oral  SpO2: 94% 99% 100% 96%  Weight:      Height:        Intake/Output Summary (Last 24 hours) at 10/23/2021 1023 Last data filed at 10/23/2021 1000 Gross per 24 hour  Intake 240 ml  Output 1175 ml  Net -935 ml   Last 3 Weights 10/18/2021 10/17/2021 10/17/2021  Weight (lbs) (No Data) (No Data) 321 lb 3.4 oz  Weight (kg) (No Data) (No Data) 145.7 kg      Telemetry    No tele - Personally Reviewed  ECG    No new - Personally Reviewed  Physical Exam   GEN: No acute distress.   Neck: No JVD sitting up in chair Cardiac: irreg irreg, no murmurs, rubs, or gallops.  Respiratory: Clear to auscultation bilaterally. Does have rare wheeze GI: Soft, nontender,  non-distended + bruising and dressing in place MS: 2+ lower ext edema; No deformity. Neuro:  Nonfocal  Psych: Normal affect   Labs    High Sensitivity Troponin:  No results for input(s): TROPONINIHS in the last 720 hours.   Chemistry Recent Labs  Lab 10/17/21 0130 10/18/21 0307  NA 132* 136  K 3.8 4.2  CL 103 106  CO2 18* 19*  GLUCOSE 102* 96  BUN 16 16  CREATININE 1.03* 0.87  CALCIUM 8.6* 8.6*  PROT 6.6  --   ALBUMIN 3.4*  --   AST 16  --   ALT 23  --   ALKPHOS 33*  --   BILITOT 1.4*  --   GFRNONAA >60 >60  ANIONGAP 11 11    Lipids No results for input(s): CHOL, TRIG, HDL, LABVLDL, LDLCALC, CHOLHDL in the last 168 hours.  Hematology Recent Labs  Lab 10/21/21 0416 10/22/21 0412 10/23/21 0415  WBC 9.9 9.7 10.6*  RBC 2.73* 2.70* 2.98*  HGB 7.7* 7.4* 8.3*  HCT 23.3* 23.0* 25.6*  MCV 85.3 85.2 85.9  MCH 28.2 27.4 27.9  MCHC 33.0 32.2 32.4  RDW 17.5* 17.4* 17.8*  PLT 310 365 481*   Thyroid  Recent Labs  Lab 10/17/21 0041  TSH 3.083    BNPNo results for input(s): BNP, PROBNP in the last 168 hours.  DDimer No results for input(s): DDIMER in the last 168 hours.   Radiology    CT ABDOMEN PELVIS W CONTRAST  Result Date: 10/21/2021 CLINICAL DATA:  Abdominal pain EXAM: CT ABDOMEN AND PELVIS WITH CONTRAST TECHNIQUE: Multidetector CT imaging of the abdomen and pelvis was performed using the standard protocol following bolus administration of intravenous contrast. CONTRAST:  170m OMNIPAQUE IOHEXOL 350 MG/ML SOLN COMPARISON:  10/17/2021 FINDINGS: Lower chest: No acute abnormality Hepatobiliary: No focal hepatic abnormality. Gallbladder unremarkable. Pancreas: No focal abnormality or ductal dilatation. Spleen: No focal abnormality.  Normal size. Adrenals/Urinary Tract: Left renal parapelvic cysts. No hydronephrosis. Adrenal glands unremarkable. Urinary bladder decompressed, grossly unremarkable. Stomach/Bowel: Normal appendix. Changes of gastric sleeve. Stomach, large and  small bowel grossly unremarkable. Vascular/Lymphatic: No evidence of aneurysm or adenopathy. Reproductive: Uterus and adnexa unremarkable.  No mass. Other: No free fluid or free air. Large anterior abdominal wall fluid collection again noted. Pigtail drainage catheter has been placed into the largest fluid collection. This measures 10.4 x 6.9 current leak, compared with 10.1 x 8.3 cm previously. Overall this is not significantly changed. Layering high density material with in the fluid collections suggest layering hematocrit level related to hematoma. Smaller adjacent fluid collections superiorly also unchanged. Musculoskeletal: No acute bony abnormality. IMPRESSION: Anterior abdominal wall fluid collections with layering hematocrit levels most compatible with postoperative hematomas. Pigtail drainage catheter has been placed in the largest fluid collection but is not significantly changed since prior study. No acute intra-abdominal process. Changes of gastric sleeve. Electronically Signed   By: KRolm BaptiseM.D.   On: 10/21/2021 12:39   VAS [place]OWER EXTREMITY VENOUS (DVT)  Result Date: 10/22/2021  Lower Venous DVT Study Patient Name:  [name] Date of Exam:   10/22/2021 Medical Rec #: [id number]         Accession #:    [id number] of Birth: 511-14-1957         Patient Gender: F Patient Age:   89years [facility]:      VAS [place]OWER EXTREMITY VENOUS (DVT) Referring Phys: KClaiborne BillingsOSBORNE --------------------------------------------------------------------------------  Indications: Swelling.  Risk Factors: Surgery. Limitations: Body habitus, poor ultrasound/tissue interface and patient pain tolerance. Comparison Study: No prior studies. Performing Technologist: GOliver HumRVT  Examination Guidelines: A complete evaluation includes B-mode imaging, spectral Doppler, color Doppler, and power Doppler as needed of all accessible portions of each vessel. Bilateral  testing is considered an integral part of a complete examination. Limited examinations for reoccurring indications may be performed as noted. The reflux portion of the exam is performed with the patient in reverse Trendelenburg.  +---------+---------------+---------+-----------+----------+-------------------+  RIGHT     Compressibility Phasicity Spontaneity Properties Thrombus Aging       +---------+---------------+---------+-----------+----------+-------------------+  CFV       Full            Yes       Yes                                         +---------+---------------+---------+-----------+----------+-------------------+  SFJ       Full                                                                  +---------+---------------+---------+-----------+----------+-------------------+  FV Prox   Full                                                                  +---------+---------------+---------+-----------+----------+-------------------+  FV Mid    Full                                                                  +---------+---------------+---------+-----------+----------+-------------------+  FV Distal                 Yes       Yes                                         +---------+---------------+---------+-----------+----------+-------------------+  PFV       Full                                                                  +---------+---------------+---------+-----------+----------+-------------------+  POP       Full            Yes       Yes                                         +---------+---------------+---------+-----------+----------+-------------------+  PTV       Full                                                                  +---------+---------------+---------+-----------+----------+-------------------+  PERO                                                       Not well visualized  +---------+---------------+---------+-----------+----------+-------------------+    +----+---------------+---------+-----------+----------+--------------+  LEFT Compressibility Phasicity Spontaneity Properties Thrombus Aging  +----+---------------+---------+-----------+----------+--------------+  CFV  Full            Yes       Yes                                    +----+---------------+---------+-----------+----------+--------------+    Summary: RIGHT: - There is no evidence of deep vein thrombosis in the lower extremity. However, portions of this examination were limited- see technologist comments above.  - No cystic structure found  in the popliteal fossa.  LEFT: - No evidence of common femoral vein obstruction.  *See table(s) above for measurements and observations. Electronically signed by Harold Barban MD on 10/22/2021 at 6:43:55 PM.    Final     Cardiac Studies   Last echo 01/2020 with EF 55-60% mild to mod MR, aortic dilatation of 42 mm.    Last Nuc 01/2020 study normal, low risk study.  Neg DVT yesterday though could not see veins completely   Patient Profile     65 y.o. female  with a hx of longstanding persistent a fib, HTN, h/o DVT, hyperthyroidism, OSA, obesity being followed by Cardiology post laparoscopic sleeve gastrectomy on 10/08/2021 followed by acute bleed in surgical area (abdominal wall hematoma) and atrial fib with RVR  Assessment & Plan    1.  Permanent atrial fibrillation with CHA2DS2-VASc score of 3.  She has had recent RVR in the setting of acute illness/anemia.  Was on Lopressor 12.5 mg twice daily and increased to 25 BID.  Anticoagulation has been on hold secondary to abdominal wall hematoma status post laparoscopic sleeve gastrectomy and hiatal hernia repair. resumed last pm.   HR is stable 90s to 100 overall. BP stable    2.  Postoperative/blood loss anemia.  Hemoglobin is down to 6.5 from 7.8 though post transfusion X 3 units total Hgb today is 8.3 and being followed by the surgical team.  Xarelto resumed last pm.    3. Lower ext edema was on HCTZ  prior to surgery, may need to resume if possible  4. OSA BiPAP therapy   For questions or updates, please contact [place] Please consult [email or web address] for contact info under        Signed, Cecilie Kicks, NP  10/23/2021, 10:23 AM    Attending Note:   The patient was seen and examined.  Agree with assessment and plan as noted above.  Changes made to the above note as needed.  Patient seen and independently examined with Cecilie Kicks, NP .   We discussed all aspects of the encounter. I agree with the assessment and plan as stated above.     Atrial fib:   HR is well controlled.   Xarelto was restarted last night.   Plans are to go home today . Follow up with Dr. Suzanne Boron .   She was on Maxzide prior to her surgery  The surgical team did not want for her to become volume depleted. Ive instructed her to measure and record her BP daily Elevated her legs higher than her heart, Let dr. Gasper Sells or his nurse know if she needs to be seen sooner that her appt.     I have spent a total of 40 minutes with patient reviewing hospital  notes , telemetry, EKGs, labs and examining patient as well as establishing an assessment and plan that was discussed with the patient.  > 50% of time was spent in direct patient care.    Thayer Headings, Brooke Bonito., MD, [facility] 10/23/2021, 12:49 PM [address],  [place] Pager [phone]

## 2021-10-23 NOTE — Progress Notes (Signed)
Progress Note     Subjective: Patient reports she has some nausea this AM, was having diarrhea yesterday. She reports she has been taking IV dilaudid because oxycodone did not help her pain at all, the tramadol was working better at home. She really would like to go home later today if feeling better.   Objective: Vital signs in last 24 hours: Temp:  [98.8 F (37.1 C)-99.5 F (37.5 C)] 99 F (37.2 C) (12/28 0449) Pulse Rate:  [91-100] 91 (12/28 0449) Resp:  [14-18] 17 (12/28 0449) BP: (134-140)/(79-86) 134/79 (12/28 0449) SpO2:  [96 %-100 %] 96 % (12/28 0449) Last BM Date: 10/13/21  Intake/Output from previous day: 12/27 0701 - 12/28 0700 In: 240 [P.O.:240] Out: 1375 [Urine:1375] Intake/Output this shift: No intake/output data recorded.  PE: General: Obese, NAD Abd: large ecchymosis and firmness across entire right side extending across LLQ.  Drain site with some bloody drainage on dressing.  Incisions c/d/i   Lab Results:  Recent Labs    10/22/21 0412 10/23/21 0415  WBC 9.7 10.6*  HGB 7.4* 8.3*  HCT 23.0* 25.6*  PLT 365 481*   BMET No results for input(s): NA, K, CL, CO2, GLUCOSE, BUN, CREATININE, CALCIUM in the last 72 hours. PT/INR No results for input(s): LABPROT, INR in the last 72 hours. CMP     Component Value Date/Time   NA 136 10/18/2021 0307   NA 140 08/15/2020 0000   K 4.2 10/18/2021 0307   CL 106 10/18/2021 0307   CO2 19 (L) 10/18/2021 0307   GLUCOSE 96 10/18/2021 0307   BUN 16 10/18/2021 0307   BUN 27 (A) 08/15/2020 0000   CREATININE 0.87 10/18/2021 0307   CREATININE 0.66 06/13/2016 1432   CALCIUM 8.6 (L) 10/18/2021 0307   PROT 6.6 10/17/2021 0130   PROT 6.9 04/25/2020 1328   ALBUMIN 3.4 (L) 10/17/2021 0130   ALBUMIN 4.4 04/25/2020 1328   AST 16 10/17/2021 0130   ALT 23 10/17/2021 0130   ALKPHOS 33 (L) 10/17/2021 0130   BILITOT 1.4 (H) 10/17/2021 0130   BILITOT 0.3 04/25/2020 1328   GFRNONAA >60 10/18/2021 0307   GFRAA 66 08/15/2020  0000   Lipase     Component Value Date/Time   LIPASE 28 10/17/2021 0130       Studies/Results: CT ABDOMEN PELVIS W CONTRAST  Result Date: 10/21/2021 CLINICAL DATA:  Abdominal pain EXAM: CT ABDOMEN AND PELVIS WITH CONTRAST TECHNIQUE: Multidetector CT imaging of the abdomen and pelvis was performed using the standard protocol following bolus administration of intravenous contrast. CONTRAST:  123mL OMNIPAQUE IOHEXOL 350 MG/ML SOLN COMPARISON:  10/17/2021 FINDINGS: Lower chest: No acute abnormality Hepatobiliary: No focal hepatic abnormality. Gallbladder unremarkable. Pancreas: No focal abnormality or ductal dilatation. Spleen: No focal abnormality.  Normal size. Adrenals/Urinary Tract: Left renal parapelvic cysts. No hydronephrosis. Adrenal glands unremarkable. Urinary bladder decompressed, grossly unremarkable. Stomach/Bowel: Normal appendix. Changes of gastric sleeve. Stomach, large and small bowel grossly unremarkable. Vascular/Lymphatic: No evidence of aneurysm or adenopathy. Reproductive: Uterus and adnexa unremarkable.  No mass. Other: No free fluid or free air. Large anterior abdominal wall fluid collection again noted. Pigtail drainage catheter has been placed into the largest fluid collection. This measures 10.4 x 6.9 current leak, compared with 10.1 x 8.3 cm previously. Overall this is not significantly changed. Layering high density material with in the fluid collections suggest layering hematocrit level related to hematoma. Smaller adjacent fluid collections superiorly also unchanged. Musculoskeletal: No acute bony abnormality. IMPRESSION: Anterior abdominal wall fluid collections  with layering hematocrit levels most compatible with postoperative hematomas. Pigtail drainage catheter has been placed in the largest fluid collection but is not significantly changed since prior study. No acute intra-abdominal process. Changes of gastric sleeve. Electronically Signed   By: Rolm Baptise M.D.   On:  10/21/2021 12:39   VAS [place] LOWER EXTREMITY VENOUS (DVT)  Result Date: 10/22/2021  Lower Venous DVT Study Patient Name:  KAYANNA RIDINGER  Date of Exam:   10/22/2021 Medical Rec #: [id number]          Accession #:    [id number] Date of Birth: [birth year]          Patient Gender: F Patient Age:   65 years Exam Location:  [facility] Procedure:      VAS [place] LOWER EXTREMITY VENOUS (DVT) Referring Phys: Claiborne Billings OSBORNE --------------------------------------------------------------------------------  Indications: Swelling.  Risk Factors: Surgery. Limitations: Body habitus, poor ultrasound/tissue interface and patient pain tolerance. Comparison Study: No prior studies. Performing Technologist: Oliver Hum RVT  Examination Guidelines: A complete evaluation includes B-mode imaging, spectral Doppler, color Doppler, and power Doppler as needed of all accessible portions of each vessel. Bilateral testing is considered an integral part of a complete examination. Limited examinations for reoccurring indications may be performed as noted. The reflux portion of the exam is performed with the patient in reverse Trendelenburg.  +---------+---------------+---------+-----------+----------+-------------------+  RIGHT     Compressibility Phasicity Spontaneity Properties Thrombus Aging       +---------+---------------+---------+-----------+----------+-------------------+  CFV       Full            Yes       Yes                                         +---------+---------------+---------+-----------+----------+-------------------+  SFJ       Full                                                                  +---------+---------------+---------+-----------+----------+-------------------+  FV Prox   Full                                                                  +---------+---------------+---------+-----------+----------+-------------------+  FV Mid    Full                                                                   +---------+---------------+---------+-----------+----------+-------------------+  FV Distal                 Yes       Yes                                         +---------+---------------+---------+-----------+----------+-------------------+  PFV       Full                                                                  +---------+---------------+---------+-----------+----------+-------------------+  POP       Full            Yes       Yes                                         +---------+---------------+---------+-----------+----------+-------------------+  PTV       Full                                                                  +---------+---------------+---------+-----------+----------+-------------------+  PERO                                                       Not well visualized  +---------+---------------+---------+-----------+----------+-------------------+   +----+---------------+---------+-----------+----------+--------------+  LEFT Compressibility Phasicity Spontaneity Properties Thrombus Aging  +----+---------------+---------+-----------+----------+--------------+  CFV  Full            Yes       Yes                                    +----+---------------+---------+-----------+----------+--------------+    Summary: RIGHT: - There is no evidence of deep vein thrombosis in the lower extremity. However, portions of this examination were limited- see technologist comments above.  - No cystic structure found in the popliteal fossa.  LEFT: - No evidence of common femoral vein obstruction.  *See table(s) above for measurements and observations. Electronically signed by Harold Barban MD on 10/22/2021 at 6:43:55 PM.    Final     Anti-infectives: Anti-infectives (From admission, onward)    None        Assessment/Plan S/p laparoscopic sleeve gastrectomy, hiatal hernia repair, upper endoscopy 10/08/21 Dr. Kae Heller Post-operative abdominal wall hematoma  ABL anemia  - Hgb 8.3 from 7.4 after  resumption of xarelto, stable  - ABDOMINAL BINDER FOR COMPRESSION - was ordered on admission.  Patient not wearing as she states it makes her pain worse. - scheduled PO zofran instead of reglan IV, transition to tramadol for pain control - discussed with patient that she may have some liquefying hematoma drain from prior drain site, change dressing prn   - will check for possible discharge this afternoon    FEN: bari advanced VTE: SCDs, xarelto restarted 12/27 ID: no acute issues   Atrial fibrillation RVR  cards following  Hx of DVT in the 1990s/ RLE edema 12/27 - [place] negative for DVT HTN Hx of Hyperthyroidism Chronic low back pain Arthritis  Depression Morbid Obesity - BMI 58.89  LOS: 5 days  Juliet Rude, [facility] Surgery 10/23/2021, 9:21 AM Please see Amion for pager number during day hours 7:00am-4:30pm

## 2021-10-23 NOTE — Progress Notes (Signed)
Discharge instructions given to patient and all questions were answered.  

## 2021-10-23 NOTE — Discharge Summary (Signed)
[facility] Discharge Summary   Patient ID: [name] MRN: [id number] DOB/AGE: 63-Oct-1957 63 y.o.  Admit date: 10/17/2021 Discharge date: 10/23/2021  Admitting Diagnosis: Post-operative hematoma   Discharge Diagnosis Patient Active Problem List   Diagnosis Date Noted   Postoperative hematoma of subcutaneous tissue following non-dermatologic procedure 10/17/2021   Insulin resistance 05/11/2020   DOE (dyspnea on exertion) 02/09/2020   Depression 09/26/2019   Vitamin D deficiency 09/01/2019   Class 3 severe obesity with serious comorbidity and body mass index (BMI) of 50.0 to 59.9 in adult [facility]) 09/01/2019   Prediabetes 08/15/2019   Atrial fibrillation (HCC)    Chest pain 04/22/2016   Hypokalemia 04/22/2016   Morbid obesity ([place]) 04/22/2016   Hypertension 04/22/2016   Paroxysmal A-fib ([place]) 04/22/2016   Hypomagnesemia 04/22/2016    Consultants Cardiology  Interventional radiology   Imaging: VAS [place] LOWER EXTREMITY VENOUS (DVT)  Result Date: 10/22/2021  Lower Venous DVT Study Patient Name:  [name]  Date of Exam:   10/22/2021 Medical Rec #: [id number]          Accession #:    [id number] Date of Birth: 1956/04/22          Patient Gender: F Patient Age:   38 years Exam Location:  [facility] Procedure:      VAS [place] LOWER EXTREMITY VENOUS (DVT) Referring Phys: Claiborne Billings OSBORNE --------------------------------------------------------------------------------  Indications: Swelling.  Risk Factors: Surgery. Limitations: Body habitus, poor ultrasound/tissue interface and patient pain tolerance. Comparison Study: No prior studies. Performing Technologist: Oliver Hum RVT  Examination Guidelines: A complete evaluation includes B-mode imaging, spectral Doppler, color Doppler, and power Doppler as needed of all accessible portions of each vessel. Bilateral testing is considered an integral part of a complete examination. Limited examinations for reoccurring  indications may be performed as noted. The reflux portion of the exam is performed with the patient in reverse Trendelenburg.  +---------+---------------+---------+-----------+----------+-------------------+  RIGHT     Compressibility Phasicity Spontaneity Properties Thrombus Aging       +---------+---------------+---------+-----------+----------+-------------------+  CFV       Full            Yes       Yes                                         +---------+---------------+---------+-----------+----------+-------------------+  SFJ       Full                                                                  +---------+---------------+---------+-----------+----------+-------------------+  FV Prox   Full                                                                  +---------+---------------+---------+-----------+----------+-------------------+  FV Mid    Full                                                                  +---------+---------------+---------+-----------+----------+-------------------+  FV Distal                 Yes       Yes                                         +---------+---------------+---------+-----------+----------+-------------------+  PFV       Full                                                                  +---------+---------------+---------+-----------+----------+-------------------+  POP       Full            Yes       Yes                                         +---------+---------------+---------+-----------+----------+-------------------+  PTV       Full                                                                  +---------+---------------+---------+-----------+----------+-------------------+  PERO                                                       Not well visualized  +---------+---------------+---------+-----------+----------+-------------------+   +----+---------------+---------+-----------+----------+--------------+   LEFT Compressibility Phasicity Spontaneity Properties Thrombus Aging  +----+---------------+---------+-----------+----------+--------------+  CFV  Full            Yes       Yes                                    +----+---------------+---------+-----------+----------+--------------+    Summary: RIGHT: - There is no evidence of deep vein thrombosis in the lower extremity. However, portions of this examination were limited- see technologist comments above.  - No cystic structure found in the popliteal fossa.  LEFT: - No evidence of common femoral vein obstruction.  *See table(s) above for measurements and observations. Electronically signed by Coral Else MD on 10/22/2021 at 6:43:55 PM.    Final     Procedures Dr. Cletis Athens Mugweru (10/18/21) - Drain placement into abdominal wall hematoma  Hospital Course:  Patient is a 65 year old female who presented to [facility] s/p sleeve gastrectomy 12/13 with abdominal pain and bruising.  Workup showed post-operative abdominal wall hematoma.  Patient was admitted for serial hemoglobin checks and monitoring. Patient was transfused 2 units PRBC and had appropriate rise in hemoglobin level. Patient developed A.Fib with RVR and cardiology was consulted. IR was consulted for drainage and patient underwent procedure listed above. Tolerated procedure well and was transferred to the floor.  Diet was advanced as tolerated. Xarelto was resumed  12/27.   On 10/23/21, the patient was voiding well, tolerating diet, ambulating well, pain well controlled, vital signs stable, incisions c/d/I, and hemoglobin stable and felt stable for discharge home.  Patient will follow up in our office next week and knows to call with questions or concerns.   I or a member of my team have reviewed this patient in the Controlled Substance Database.   Allergies as of 10/23/2021       Reactions   Amlodipine Besylate Swelling   Other reaction(s): edema at 10 mg dose   Codeine Hives   Triamcinolone Nausea  Only   Latex Itching, Rash        Medication List     STOP taking these medications    buPROPion 150 MG 24 hr tablet Commonly known as: WELLBUTRIN XL   enoxaparin 150 MG/ML injection Commonly known as: LOVENOX       TAKE these medications    acetaminophen 500 MG tablet Commonly known as: TYLENOL Take 2 tablets (1,000 mg total) by mouth every 6 (six) hours as needed for mild pain, fever or headache. What changed:  medication strength how much to take when to take this reasons to take this   albuterol 108 (90 Base) MCG/ACT inhaler Commonly known as: VENTOLIN HFA Inhale 1-2 puffs into the lungs every 6 (six) hours as needed for shortness of breath or wheezing.   amphetamine-dextroamphetamine 15 MG 24 hr capsule Commonly known as: ADDERALL XR Take 15 mg by mouth 2 (two) times daily.   bariatric multiple vitamin Chew chewable tablet Chew 2 tablets by mouth daily. Start taking on: October 24, 2021   DULoxetine 30 MG capsule Commonly known as: CYMBALTA Take 60 mg by mouth daily.   gabapentin 400 MG capsule Commonly known as: NEURONTIN Take 400 mg by mouth 3 (three) times daily.   metoprolol tartrate 25 MG tablet Commonly known as: LOPRESSOR TAKE 1/2 TABLET(12.5 MG TOTAL) BY MOUTH TWICE DAILY What changed:  how much to take how to take this when to take this additional instructions   ondansetron 4 MG disintegrating tablet Commonly known as: ZOFRAN-ODT Dissolve 1 tablet (4 mg total) by mouth every 6 (six) hours as needed for nausea or vomiting.   pantoprazole 40 MG tablet Commonly known as: PROTONIX Take 1 tablet (40 mg total) by mouth daily.   traMADol 50 MG tablet Commonly known as: ULTRAM Take 1 tablet (50 mg total) by mouth every 6 (six) hours as needed (pain).   VITAMIN C GUMMIE PO Take 2 tablets by mouth daily.   Xarelto 20 MG Tabs tablet Generic drug: rivaroxaban TAKE 1 TABLET(20 MG) BY MOUTH DAILY WITH SUPPER          Follow-up  Information     Surgery, [place]. Go on 10/31/2021.   Specialty: General Surgery Why: 10:00 AM, For wound re-check. Please arrive 15 min prior to appointment time. Contact information: [address] [place] [address] [phone]         Werner Lean, MD Follow up on 11/27/2021.   Specialty: Cardiology Why: at 10:45 Am with his PA [facility] information: [place] [id number] [phone]                 Signed: Norm Parcel , [facility] Surgery 10/23/2021, 1:46 PM Please see Amion for pager number during day hours 7:00am-4:30pm

## 2021-10-23 NOTE — Care Management Important Message (Signed)
Important Message  Patient Details IM Letter given to the Patient. Name: [name] MRN: [id number] Date of Birth: 03/07/1956   Medicare Important Message Given:  Yes     Caren Macadam 10/23/2021, 12:39 PM

## 2021-10-24 ENCOUNTER — Telehealth: Payer: Self-pay | Admitting: Skilled Nursing Facility1

## 2021-10-24 NOTE — Telephone Encounter (Signed)
Dietitian called pt in an attempt to connect from call earlier in the week.   LVM

## 2021-11-06 ENCOUNTER — Encounter: Payer: Medicare Other | Attending: Family Medicine | Admitting: Skilled Nursing Facility1

## 2021-11-06 DIAGNOSIS — Z713 Dietary counseling and surveillance: Secondary | ICD-10-CM | POA: Insufficient documentation

## 2021-11-06 NOTE — Progress Notes (Signed)
Bariatric Nutrition Follow-Up Visit Medical Nutrition Therapy    NUTRITION ASSESSMENT    Surgery date: 10/08/2021 Surgery type: sleeve Start weight at [facility]: 327.9 Weight today: virtual appt; pt identified by name and DOB, pt agreeable to this visit type  Clinical  Medical hx: DVT, HTN, depression, sleep apnea Medications: see list  Labs: albumin 3.3, creatinine 1.03, calcium 8.8, hemoglobin 11.2, MCH 24.8, RDW 16.2 Notable signs/symptoms: pain, limited mobility, diarrhea not often Any previous deficiencies? No   Lifestyle & Dietary Hx  Pt states over the last 3 days she has been feeling weak and has a swollen right leg which she will be discussing with her surgeon today.  Pt states she wakes with nausea so needs a zofran Pt states she has tried a wide variety of yogurt but cannot tolerate it.  Pt states she tolerates cold beverages better.  Pt states she does not have any nausea or vomiting throughout the day and reports no bowel issues.  Pt states protein powders and shakes make her sick.   Dietitian congratulated pt on her eating every few hours.   Pt states when she first wakes her urine has a sulfur smell.   Estimated daily fluid intake: 30-40 oz Estimated daily protein intake: 40 g Supplements: multi Current average weekly physical activity: ADLs due to feeling weak   24-Hr Dietary Recall First Meal: take a zofran, drinks water, takes meds Snack: 1 boiled egg (3/4 of it) then  Second Meal 2 hours from boiled egg: deli meat with swiss cheese (3/4 of it) Snack 2pm: navy beans ( unsure how much) Third Meal: backed chicken and beans (unsure how much) Snack: 1 sugar free popcicle Beverages: water, water + flavoring   Post-Op Goals/ Signs/ Symptoms Using straws: no Drinking while eating: no Chewing/swallowing difficulties: no Changes in vision: no Changes to mood/headaches: no Hair loss/changes to skin/nails: no Difficulty focusing/concentrating: no Sweating:  no Limb weakness: no Dizziness/lightheadedness: no Palpitations: no  Carbonated/caffeinated beverages: no N/V/D/C/Gas: yes Abdominal pain: no Dumping syndrome: no    NUTRITION DIAGNOSIS  Overweight/obesity ([place]-3.3) related to past poor dietary habits and physical inactivity as evidenced by completed bariatric surgery and following dietary guidelines for continued weight loss and healthy nutrition status.     NUTRITION INTERVENTION Nutrition counseling (C-1) and education (E-2) to facilitate bariatric surgery goals, including: The importance of consuming adequate calories as well as certain nutrients daily due to the body's need for essential vitamins, minerals, and fats The importance of intuitive eating specifically learning hunger-satiety cues and understanding the importance of learning a new body: The importance of mindful eating to avoid grazing behaviors  Purpose of hydration: Water makes up over 50% of your total body water, and is part of many organs throughout the body. Water is essential to transport digested nutrients, regulate body temperature, rid the body of waste products, and protects joints and the spinal cord. When not properly hydrated you will begin to experience headaches, cramps and dizziness. Further dehydration can result in rapid heart rate, shock, oliguria, and may cause seizures.  https://www.merckmanuals.com/home/hormonal-and-metabolic-disordehttps://www.usgs.gov/special-topic/water-science-school/science/water-you-water-and-human-body?qt-science_center_objects=0#qt-science_center_objectsrs/water-balance/about-body-water HistoricalGrowth.gl https://www.stevens.org/ PimpTShirt.fi https://www.health.InvestmentBrowse.at  Goals: -1/3 of water bottle per hour aiming for a minimum  3 bottles per day with 4 being the ultimate goal  -Add In 30 grams of unflavored protein powder and either make a popcicle or a slushie with low sugar juice or other flavoring options  -if you slow down on solid foods add in another 30 grams of protein supplement  Handouts Provided Include  N/A  Learning Style & Readiness for Change Teaching method utilized: Visual & Auditory  Demonstrated degree of understanding via: Teach Back  Readiness Level: Action Barriers to learning/adherence to lifestyle change: general weakness  RD's Notes for Next Visit Assess adherence to pt chosen goals    MONITORING & EVALUATION Dietary intake, weekly physical activity, body weight  Next Steps Patient is to follow-up: dietitian will call pt tomorrow to check in on progress

## 2021-11-07 ENCOUNTER — Telehealth: Payer: Self-pay | Admitting: Skilled Nursing Facility1

## 2021-11-07 NOTE — Telephone Encounter (Signed)
Called to check in on pts progress.   Pt states she got sick (nausea no vomiting) last night sating she thinks it was cheese but feels much better.  Pt states getting the protein and fluid in is going pretty good so far.

## 2021-11-13 ENCOUNTER — Ambulatory Visit (HOSPITAL_COMMUNITY)
Admission: RE | Admit: 2021-11-13 | Discharge: 2021-11-13 | Disposition: A | Discharge status: Home / Self Care | Payer: Medicare Other | Source: Ambulatory Visit | Attending: Physician Assistant | Admitting: Physician Assistant

## 2021-11-13 ENCOUNTER — Other Ambulatory Visit: Payer: Self-pay

## 2021-11-13 ENCOUNTER — Encounter (HOSPITAL_COMMUNITY): Payer: Self-pay | Admitting: Physician Assistant

## 2021-11-13 VITALS — BP 130/88 | HR 110 | Ht 62.0 in | Wt 315.6 lb

## 2021-11-13 DIAGNOSIS — Z7901 Long term (current) use of anticoagulants: Secondary | ICD-10-CM | POA: Insufficient documentation

## 2021-11-13 DIAGNOSIS — I4821 Permanent atrial fibrillation: Secondary | ICD-10-CM | POA: Insufficient documentation

## 2021-11-13 DIAGNOSIS — Z79899 Other long term (current) drug therapy: Secondary | ICD-10-CM | POA: Insufficient documentation

## 2021-11-13 DIAGNOSIS — I10 Essential (primary) hypertension: Secondary | ICD-10-CM | POA: Insufficient documentation

## 2021-11-13 DIAGNOSIS — E669 Obesity, unspecified: Secondary | ICD-10-CM | POA: Insufficient documentation

## 2021-11-13 DIAGNOSIS — Z634 Disappearance and death of family member: Secondary | ICD-10-CM | POA: Diagnosis not present

## 2021-11-13 DIAGNOSIS — G4733 Obstructive sleep apnea (adult) (pediatric): Secondary | ICD-10-CM | POA: Insufficient documentation

## 2021-11-13 DIAGNOSIS — Z7182 Exercise counseling: Secondary | ICD-10-CM | POA: Insufficient documentation

## 2021-11-13 DIAGNOSIS — Z9884 Bariatric surgery status: Secondary | ICD-10-CM | POA: Insufficient documentation

## 2021-11-13 DIAGNOSIS — M2142 Flat foot [pes planus] (acquired), left foot: Secondary | ICD-10-CM | POA: Diagnosis not present

## 2021-11-13 DIAGNOSIS — Z6841 Body Mass Index (BMI) 40.0 and over, adult: Secondary | ICD-10-CM | POA: Diagnosis not present

## 2021-11-13 DIAGNOSIS — Z9989 Dependence on other enabling machines and devices: Secondary | ICD-10-CM | POA: Insufficient documentation

## 2021-11-13 MED ORDER — METOPROLOL TARTRATE 25 MG PO TABS: 25.0000 mg | ORAL_TABLET | Freq: Two times a day (BID) | ORAL | 1 refills | Status: DC

## 2021-11-13 NOTE — Progress Notes (Deleted)
Cardiology Office Note    Date:  11/13/2021   ID:  [name], DOB [birth year], MRN [id number]   PCP:  Harlan Stains, MD   [place]  Cardiologist:  Werner Lean, MD *** Advanced Practice Provider:  No care team member to display Electrophysiologist:  Thompson Grayer, MD   [phone]   No chief complaint on file.   History of Present Illness:  [name] is a 66 y.o. female  with a hx of hypertension, aortic atherosclerosis, DVT, permanent atrial fibrillation on chronic anticoagulation, PVC, morbid obesity, osteomyelitis, OSA   Patient had gastric sleeve  Was seen in Afib clinic 11/13/21 and HR 100-110's so Lopressor increased 25 mg bid    Past Medical History:  Diagnosis Date   Arthritis    Atrial fibrillation (HCC)    Chronic lower back pain    Depression    DVT (deep venous thrombosis) (HCC) 1990s   LLE   Dyspnea    Fallen arches    Family history of adverse reaction to anesthesia    Mother had rash and itching after anesthesia   Headache    Hypertension    Hyperthyroidism ~ 2000   "fine now" (04/23/2016)   Joint pain    Lactose intolerance    Leg edema    Obesity    Pneumonia 1980s X 1   Snoring    has not had sleep study but suspects sleep apnea    Past Surgical History:  Procedure Laterality Date   BIOPSY  08/09/2021   Procedure: BIOPSY;  Surgeon: Felicie Morn, MD;  Location: [facility];  Service: General;;   CARDIOVERSION N/A 06/18/2016   Procedure: CARDIOVERSION;  Surgeon: Larey Dresser, MD;  Location: [facility];  Service: Cardiovascular;  Laterality: N/A;   CARDIOVERSION N/A 07/11/2016   Procedure: CARDIOVERSION;  Surgeon: Lelon Perla, MD;  Location: [facility];  Service: Cardiovascular;  Laterality: N/A;   CARDIOVERSION N/A 07/02/2017   Procedure: CARDIOVERSION;  Surgeon: Sanda Klein, MD;  Location: [facility];  Service: Cardiovascular;  Laterality: N/A;   CARDIOVERSION N/A  09/29/2017   Procedure: CARDIOVERSION;  Surgeon: Larey Dresser, MD;  Location: [facility];  Service: Cardiovascular;  Laterality: N/A;   ENDOMETRIAL ABLATION  ~2006   ESOPHAGOGASTRODUODENOSCOPY N/A 08/09/2021   Procedure: ESOPHAGOGASTRODUODENOSCOPY (EGD);  Surgeon: Felicie Morn, MD;  Location: [facility];  Service: General;  Laterality: N/A;   HIATAL HERNIA REPAIR N/A 10/08/2021   Procedure: HERNIA REPAIR HIATAL;  Surgeon: Clovis Riley, MD;  Location: [facility];  Service: General;  Laterality: N/A;   IR GUIDED DRAIN W CATHETER PLACEMENT  10/18/2021   LAPAROSCOPIC GASTRIC SLEEVE RESECTION N/A 10/08/2021   Procedure: LAPAROSCOPIC GASTRIC SLEEVE RESECTION;  Surgeon: Clovis Riley, MD;  Location: [facility];  Service: General;  Laterality: N/A;   TONSILLECTOMY  1960s   TUBAL LIGATION  1979   UPPER GI ENDOSCOPY N/A 10/08/2021   Procedure: UPPER GI ENDOSCOPY;  Surgeon: Clovis Riley, MD;  Location: [facility];  Service: General;  Laterality: N/A;    Current Medications: No outpatient medications have been marked as taking for the 11/27/21 encounter (Appointment) with Imogene Burn, PA-C.     Allergies:   Amlodipine besylate, Codeine, Triamcinolone, and Latex   Social History   Socioeconomic History   Marital status: Married    Spouse name: TERRY   Number of children: 2   Years of education: Not on file   Highest education level:  Not on file  Occupational History   Occupation: Disabled  Tobacco Use   Smoking status: Former    Packs/day: 1.00    Years: 10.00    Pack years: 10.00    Types: Cigarettes    Quit date: 10/28/1987    Years since quitting: 34.0   Smokeless tobacco: Never   Tobacco comments:    Former smoker 11/13/2021  Vaping Use   Vaping Use: Never used  Substance and Sexual Activity   Alcohol use: Not Currently    Alcohol/week: 4.0 standard drinks    Types: 4 Glasses of wine per week    Comment: recently quit   Drug use: Not Currently    Types:  Marijuana    Comment: 04/23/2016 "recreational marijuana in my late teens/early 20's"   Sexual activity: Not Currently    Birth control/protection: Post-menopausal  Other Topics Concern   Not on file  Social History Narrative   Lives in [place] with husband and 3 grandchildren.   Self employed office cleaner.   Social Determinants of Health   Financial Resource Strain: Not on file  Food Insecurity: Not on file  Transportation Needs: Not on file  Physical Activity: Not on file  Stress: Not on file  Social Connections: Not on file     Family History:  The patient's ***family history includes Arthritis in her mother; CAD in her maternal grandfather; CVA in her father; Diabetes Mellitus I in her father; Liver disease in her mother; Obesity in her mother; Thyroid disease in her mother.   ROS:   Please see the history of present illness.    ROS All other systems reviewed and are negative.   PHYSICAL EXAM:   VS:  There were no vitals taken for this visit.  Physical Exam  GEN: Well nourished, well developed, in no acute distress  HEENT: normal  Neck: no JVD, carotid bruits, or masses Cardiac:RRR; no murmurs, rubs, or gallops  Respiratory:  clear to auscultation bilaterally, normal work of breathing GI: soft, nontender, nondistended, + BS Ext: without cyanosis, clubbing, or edema, Good distal pulses bilaterally MS: no deformity or atrophy  Skin: warm and dry, no rash Neuro:  Alert and Oriented x 3, Strength and sensation are intact Psych: euthymic mood, full affect  Wt Readings from Last 3 Encounters:  11/13/21 (!) 315 lb 9.6 oz (143.2 kg)  10/17/21 (!) 321 lb 3.4 oz (145.7 kg)  10/08/21 (!) 322 lb 12.8 oz (146.4 kg)      Studies/Labs Reviewed:   EKG:  EKG is*** ordered today.  The ekg ordered today demonstrates ***  Recent Labs: 10/09/2021: Magnesium 1.8 10/17/2021: ALT 23; TSH 3.083 10/18/2021: BUN 16; Creatinine, Ser 0.87; Potassium 4.2; Sodium 136 10/23/2021:  Hemoglobin 8.3; Platelets 481   Lipid Panel    Component Value Date/Time   CHOL 163 02/24/2021 0221   CHOL 157 04/25/2020 1328   TRIG 47 02/24/2021 0221   HDL 72 02/24/2021 0221   HDL 67 04/25/2020 1328   CHOLHDL 2.3 02/24/2021 0221   VLDL 9 02/24/2021 0221   LDLCALC 82 02/24/2021 0221   LDLCALC 74 04/25/2020 1328    Additional studies/ records that were reviewed today include:  Echo 02/23/20  1. Left ventricular ejection fraction, by estimation, is 55 to 60%. The  left ventricle has normal function. The left ventricle has no regional  wall motion abnormalities. There is moderate left ventricular hypertrophy.  Left ventricular diastolic function   could not be evaluated.   2. Right ventricular  systolic function is normal. The right ventricular  size is normal. There is normal pulmonary artery systolic pressure.   3. Left atrial size was severely dilated.   4. The mitral valve is abnormal. Mild to moderate mitral valve  regurgitation.   5. The aortic valve is tricuspid. Aortic valve regurgitation is not  visualized.   6. Aortic dilatation noted. There is mild dilatation of the ascending  aorta measuring 42 mm.   7. The inferior vena cava is normal in size with greater than 50%  respiratory variability, suggesting right atrial pressure of 3 mmHg.    Myoview 02/23/20   The left ventricular ejection fraction is normal (55-65%). Nuclear stress EF: 49%. There was no ST segment deviation noted during stress. The study is normal. This is a low risk study.   Normal pharmacologic nuclear stress test with no evidence for prior infarct or ischemia. LVEF 55%. Frequent PVCs during stress and in recovery.   CT Angio Chest Aorta 01/2021   IMPRESSION: No evidence of aortic dissection.   Mild aneurysmal dilatation to 4.2 cm in the ascending aorta. Recommend annual imaging followup by CTA or MRA. This recommendation follows 2010 ACCF/AHA/AATS/ACR/ASA/SCA/SCAI/SIR/STS/SVM Guidelines for  the Diagnosis and Management of Patients with Thoracic Aortic Disease. Circulation. 2010; [id number]. Aortic aneurysm NOS (ICD10-I71.9)   Mild dependent atelectatic changes in the lungs bilaterally.   Aortic Atherosclerosis (ICD10-I70.0).   Risk Assessment/Calculations:   {Does this patient have ATRIAL FIBRILLATION?:[id number]}     ASSESSMENT:    No diagnosis found.   PLAN:  In order of problems listed above:  Aortic atherosclerosis - Noted by CT angio 01/2021. Recommend addition of statin, detailed below. No aspirin due to chronic anticoagulation.    Permanent atrial fibrillation / Chronic anticoagulation - Rate controlled today by EKG. Conitnue current dose Lopressor 12.5mg  BID. Continue Xarelto 20mg  QD for anticoagulation. Denies bleeding complications.    HLD - Given aortic atherosclerosis recommend initiation of statin. 04/11/21 LDL 83, HDL 78, total cholesterol 178. She is hesitant to change medication prior to bariatric surgery. Encouraged to consider addition of statin and will discuss again at follow up.    OSA - Recent diagnosis with plans to start BIPAP. Discussed the importance of BIPAP therapy in heart health.   Obesity - S/P gastric sleeve    Shared Decision Making/Informed Consent   {Are you ordering a CV Procedure (e.g. stress test, cath, DCCV, TEE, etc)?   Press F2        :[id number]    Medication Adjustments/Labs and Tests Ordered: Current medicines are reviewed at length with the patient today.  Concerns regarding medicines are outlined above.  Medication changes, Labs and Tests ordered today are listed in the Patient Instructions below. There are no Patient Instructions on file for this visit.   Sumner Boast, PA-C  11/13/2021 3:25 PM    [facility] [place], [place], [place]  [address] Phone: [phone]; Fax: [phone]

## 2021-11-13 NOTE — Patient Instructions (Signed)
Increase metoprolol to 25mg twice a day

## 2021-11-13 NOTE — Progress Notes (Signed)
Primary Care Physician: Harlan Stains, MD Referring Physician: Dr. Otelia Limes is a 66 y.o. female with a h/o PAF, obesity, HTN, ambulatory issues due to severe fallen arch of left foot, in afib clinic today for f/u. Pt had held in SR for a long time in [place] but then had breakthrough afib.She was cardioverted with ERAF. Multaq was washed out and flecainde was started. She was suppose to return in October for cardioversion after starting flecainde, but her mother got sick and died, and then her granddaughter whom she raises had to have colon surgery,  F/u in afib clinic 12/17, after successful cardioversion 12/4. She is not sure when she went back into afib, but has not felt as well for the last few days. She has now failed multaq and flecainide. Discussed options to restore SR.  She had f/u with Dr. Rayann Heman in January for options. He did not think she was overly symptomatic in afib and thought it would be best to purse weight loss/bariatric surgery. In the clinic today, she has tried very hard, working with her PCP to losse weight. She has cut her calories as low as 1400 cals a day and is very frustrated/upset that she has not been able loose any weight. She is very limited in mobility due to her left foot issues.   Follow up in the AF clinic 11/13/21. Patient underwent laparoscopic sleeve gastrectomy on 10/08/2021 and presented to the ED 10/17/21 complaining of swelling and ecchymosis entire abdomen, lightheadedness, and exertional dyspnea. She was diagnosed with an abdominal wall hematoma. Her Xarelto was briefly held. She was also noted to have an elevated heart rate on admission but was rate controlled prior to discharge. She reports that she has noticed her heart racing occasionally. She feels she is slowly recovering from her surgery.   Today, she denies symptoms of chest pain, shortness of breath, orthopnea, PND, lower extremity edema, dizziness, presyncope, syncope, or  neurologic sequela.The patient is tolerating medications without difficulties and is otherwise without complaint today.   Past Medical History:  Diagnosis Date   Arthritis    Atrial fibrillation (HCC)    Chronic lower back pain    Depression    DVT (deep venous thrombosis) (HCC) 1990s   LLE   Dyspnea    Fallen arches    Family history of adverse reaction to anesthesia    Mother had rash and itching after anesthesia   Headache    Hypertension    Hyperthyroidism ~ 2000   "fine now" (04/23/2016)   Joint pain    Lactose intolerance    Leg edema    Obesity    Pneumonia 1980s X 1   Snoring    has not had sleep study but suspects sleep apnea   Past Surgical History:  Procedure Laterality Date   BIOPSY  08/09/2021   Procedure: BIOPSY;  Surgeon: Felicie Morn, MD;  Location: [facility];  Service: General;;   CARDIOVERSION N/A 06/18/2016   Procedure: CARDIOVERSION;  Surgeon: Larey Dresser, MD;  Location: [facility];  Service: Cardiovascular;  Laterality: N/A;   CARDIOVERSION N/A 07/11/2016   Procedure: CARDIOVERSION;  Surgeon: Lelon Perla, MD;  Location: [facility];  Service: Cardiovascular;  Laterality: N/A;   CARDIOVERSION N/A 07/02/2017   Procedure: CARDIOVERSION;  Surgeon: Sanda Klein, MD;  Location: [facility];  Service: Cardiovascular;  Laterality: N/A;   CARDIOVERSION N/A 09/29/2017   Procedure: CARDIOVERSION;  Surgeon: Larey Dresser, MD;  Location:  [facility];  Service: Cardiovascular;  Laterality: N/A;   ENDOMETRIAL ABLATION  ~2006   ESOPHAGOGASTRODUODENOSCOPY N/A 08/09/2021   Procedure: ESOPHAGOGASTRODUODENOSCOPY (EGD);  Surgeon: Quentin Ore, MD;  Location: [facility];  Service: General;  Laterality: N/A;   HIATAL HERNIA REPAIR N/A 10/08/2021   Procedure: HERNIA REPAIR HIATAL;  Surgeon: Berna Bue, MD;  Location: [facility];  Service: General;  Laterality: N/A;   IR GUIDED DRAIN W CATHETER PLACEMENT  10/18/2021   LAPAROSCOPIC GASTRIC  SLEEVE RESECTION N/A 10/08/2021   Procedure: LAPAROSCOPIC GASTRIC SLEEVE RESECTION;  Surgeon: Berna Bue, MD;  Location: [facility];  Service: General;  Laterality: N/A;   TONSILLECTOMY  1960s   TUBAL LIGATION  1979   UPPER GI ENDOSCOPY N/A 10/08/2021   Procedure: UPPER GI ENDOSCOPY;  Surgeon: Berna Bue, MD;  Location: [facility];  Service: General;  Laterality: N/A;    Current Outpatient Medications  Medication Sig Dispense Refill   acetaminophen (TYLENOL) 500 MG tablet Take 2 tablets (1,000 mg total) by mouth every 6 (six) hours as needed for mild pain, fever or headache.     albuterol (VENTOLIN HFA) 108 (90 Base) MCG/ACT inhaler Inhale 1-2 puffs into the lungs every 6 (six) hours as needed for shortness of breath or wheezing.     amphetamine-dextroamphetamine (ADDERALL XR) 15 MG 24 hr capsule Take 15 mg by mouth 2 (two) times daily.     Ascorbic Acid (VITAMIN C GUMMIE PO) Take 2 tablets by mouth daily.     bariatric multiple vitamin (ADVANCED MULTI EA) CHEW chewable tablet Chew 2 tablets by mouth daily. 60 tablet 0   DULoxetine (CYMBALTA) 30 MG capsule Take 60 mg by mouth daily.     gabapentin (NEURONTIN) 400 MG capsule Take 400 mg by mouth 3 (three) times daily.     metoprolol tartrate (LOPRESSOR) 25 MG tablet TAKE 1/2 TABLET(12.5 MG TOTAL) BY MOUTH TWICE DAILY (Patient taking differently: Take 12.5 mg by mouth 2 (two) times daily.) 90 tablet 3   ondansetron (ZOFRAN-ODT) 4 MG disintegrating tablet Dissolve 1 tablet (4 mg total) by mouth every 6 (six) hours as needed for nausea or vomiting. 20 tablet 0   pantoprazole (PROTONIX) 40 MG tablet Take 1 tablet (40 mg total) by mouth daily. 90 tablet 0   traMADol (ULTRAM) 50 MG tablet Take 1 tablet (50 mg total) by mouth every 6 (six) hours as needed (pain). 15 tablet 0   XARELTO 20 MG TABS tablet TAKE 1 TABLET(20 MG) BY MOUTH DAILY WITH SUPPER (Patient not taking: Reported on 10/17/2021) 90 tablet 1   No current facility-administered  medications for this encounter.    Allergies  Allergen Reactions   Amlodipine Besylate Swelling    Other reaction(s): edema at 10 mg dose   Codeine Hives   Triamcinolone Nausea Only   Latex Itching and Rash    Social History   Socioeconomic History   Marital status: Married    Spouse name: TERRY   Number of children: 2   Years of education: Not on file   Highest education level: Not on file  Occupational History   Occupation: Disabled  Tobacco Use   Smoking status: Former    Packs/day: 1.00    Years: 10.00    Pack years: 10.00    Types: Cigarettes    Quit date: 10/28/1987    Years since quitting: 34.0   Smokeless tobacco: Never  Vaping Use   Vaping Use: Never used  Substance and Sexual Activity  Alcohol use: Not Currently    Alcohol/week: 4.0 standard drinks    Types: 4 Glasses of wine per week    Comment: recently quit   Drug use: Not Currently    Types: Marijuana    Comment: 04/23/2016 "recreational marijuana in my late teens/early 20's"   Sexual activity: Not Currently    Birth control/protection: Post-menopausal  Other Topics Concern   Not on file  Social History Narrative   Lives in [place] with husband and 3 grandchildren.   Self employed office cleaner.   Social Determinants of Health   Financial Resource Strain: Not on file  Food Insecurity: Not on file  Transportation Needs: Not on file  Physical Activity: Not on file  Stress: Not on file  Social Connections: Not on file  Intimate Partner Violence: Not on file    Family History  Problem Relation Age of Onset   Diabetes Mellitus I Father    CVA Father    Arthritis Mother    Obesity Mother    Thyroid disease Mother    Liver disease Mother    CAD Maternal Grandfather     ROS- All systems are reviewed and negative except as per the HPI above  Physical Exam: Vitals:   11/13/21 1110  Height: 5\' 2"  (1.575 m)   GEN- The patient is a well appearing obese female, alert and oriented x 3  today.   HEENT-head normocephalic, atraumatic, sclera clear, conjunctiva pink, hearing intact, trachea midline. Lungs- Clear to ausculation bilaterally, normal work of breathing Heart- irregular rate and rhythm, no murmurs, rubs or gallops  GI- soft, NT, ND, + BS Extremities- no clubbing, cyanosis. 2+ lower extremity edema MS- no significant deformity or atrophy Skin- no rash or lesion Psych- euthymic mood, full affect Neuro- strength and sensation are intact   EKG- afib Vent. rate 110 BPM PR interval * ms QRS duration 88 ms QT/QTcB 324/438 ms   Epic records reviewed   Echo 02/23/20 1. Left ventricular ejection fraction, by estimation, is 55 to 60%. The  left ventricle has normal function. The left ventricle has no regional  wall motion abnormalities. There is moderate left ventricular hypertrophy. Left ventricular diastolic function   could not be evaluated.   2. Right ventricular systolic function is normal. The right ventricular  size is normal. There is normal pulmonary artery systolic pressure.   3. Left atrial size was severely dilated.   4. The mitral valve is abnormal. Mild to moderate mitral valve  regurgitation.   5. The aortic valve is tricuspid. Aortic valve regurgitation is not  visualized.   6. Aortic dilatation noted. There is mild dilatation of the ascending  aorta measuring 42 mm.   7. The inferior vena cava is normal in size with greater than 50%  respiratory variability, suggesting right atrial pressure of 3 mmHg.     Assessment and Plan: 1. Permanent atrial fibrillation  Her heart rate has been running 100-110s recently.  Increase Lopressor to 25 mg BID Continue Xarelto 20 mg daily  2. Obesity Body mass index is 58.75 kg/m. Lifestyle modification was discussed and encouraged including regular physical activity and weight reduction. S/p gastric sleeve.  3. OSA Patient reports compliance with BiPap therapy.  4. HTN Stable, med changes as above.     Follow up with Ermalinda Barrios as scheduled.    [facility] [address] [place], [place] [address] [phone]

## 2021-11-27 ENCOUNTER — Ambulatory Visit: Payer: Medicare Other | Admitting: Physician Assistant

## 2021-12-03 ENCOUNTER — Encounter: Payer: Medicare Other | Attending: Family Medicine | Admitting: Skilled Nursing Facility1

## 2021-12-03 ENCOUNTER — Ambulatory Visit: Payer: Medicare Other | Admitting: Skilled Nursing Facility1

## 2021-12-03 DIAGNOSIS — Z713 Dietary counseling and surveillance: Secondary | ICD-10-CM | POA: Insufficient documentation

## 2021-12-03 DIAGNOSIS — Z86718 Personal history of other venous thrombosis and embolism: Secondary | ICD-10-CM | POA: Diagnosis not present

## 2021-12-03 DIAGNOSIS — I10 Essential (primary) hypertension: Secondary | ICD-10-CM | POA: Insufficient documentation

## 2021-12-03 DIAGNOSIS — G4730 Sleep apnea, unspecified: Secondary | ICD-10-CM | POA: Insufficient documentation

## 2021-12-03 NOTE — Progress Notes (Signed)
Bariatric Nutrition Follow-Up Visit Medical Nutrition Therapy    NUTRITION ASSESSMENT    Surgery date: 10/08/2021 Surgery type: sleeve Start weight at [facility]: 327.9 Weight today: virtual appt; pt identified by name and DOB, pt agreeable to this visit type  Clinical  Medical hx: DVT, HTN, depression, sleep apnea Medications: see list  Labs: albumin 3.3, creatinine 1.03, calcium 8.8, hemoglobin 11.2, MCH 24.8, RDW 16.2 Notable signs/symptoms: pain, limited mobility, diarrhea not often Any previous deficiencies? No   Lifestyle & Dietary Hx   Pt states after last visit with dietitian caught a bug where she could not eat for 3 days.  Pt state she surgeon states it will take some time to heal from the hematoma.  Pt states If water is real cold she will drink it too fast.  Pt states she does not know why she did not eat yesterday.  Pt states she has been doing better.  Pt states she has been feeling forgetful which has been vexing.  Pt states when she experiences epigastric pain she cannot tolerate eating or drinking.   Since she has not been consistent with eating advised to get 60 grams from supplements daily. Pt states she would like to add in fruit: Dietitian advised to focus on her base line goals first and then it can be revisited.   Not tolerating:  Eggs    Estimated daily fluid intake: 40-60 oz Estimated daily protein intake: 40 g Supplements: multi inconsistent, calcium  Current average weekly physical activity: ADLs due to feeling weak: dietitian advised to get up every 30 minutes even just standing for some stretches  24-Hr Dietary Recall First Meal: take a zofran, drinks water or water + gingerale, takes meds Snack:  Second Meal chili: tomato sauce, beans, beef Snack : chili Third Meal: chili Snack:  Beverages: water, water + flavoring, diluted gatorade g2  Post-Op Goals/ Signs/ Symptoms Using straws: no Drinking while eating: no Chewing/swallowing  difficulties: no Changes in vision: no Changes to mood/headaches: no Hair loss/changes to skin/nails: no Difficulty focusing/concentrating: no Sweating: no Limb weakness: no Dizziness/lightheadedness: no Palpitations: no  Carbonated/caffeinated beverages: no N/V/D/C/Gas: yes Abdominal pain: no Dumping syndrome: no    NUTRITION DIAGNOSIS  Overweight/obesity ([place]-3.3) related to past poor dietary habits and physical inactivity as evidenced by completed bariatric surgery and following dietary guidelines for continued weight loss and healthy nutrition status.     NUTRITION INTERVENTION Nutrition counseling (C-1) and education (E-2) to facilitate bariatric surgery goals, including: The importance of consuming adequate calories as well as certain nutrients daily due to the body's need for essential vitamins, minerals, and fats The importance of intuitive eating specifically learning hunger-satiety cues and understanding the importance of learning a new body: The importance of mindful eating to avoid grazing behaviors  Purpose of protein: Every cell in your body has protein. Protein is essential for the structure, function and regulation of tissues and organs within the body. Without protein enzymes and antibodies would not exist, and cells would lack storage, transportation, and messenger systems. According to [facility]. [place], the body is made up of at least 10000 different proteins. Lack of protein can lead to growth failure in children, loss of muscle mass, decreased immune system function, and overall weakening of various organs in the body.  [email or web address], [email or web address], [email or web address].tn Purpose of hydration: Water makes up over 50% of your total body water, and is part of many organs throughout the body. Water is essential to transport digested  nutrients,  regulate body temperature, rid the body of waste products, and protects joints and the spinal cord. When not properly hydrated you will begin to experience headaches, cramps and dizziness. Further dehydration can result in rapid heart rate, shock, oliguria, and may cause seizures.  https://www.merckmanuals.com/home/hormonal-and-metabolic-disordehttps://www.usgs.gov/special-topic/water-science-school/science/water-you-water-and-human-body?qt-science_center_objects=0#qt-science_center_objectsrs/water-balance/about-body-water HistoricalGrowth.gl https://www.stevens.org/ PimpTShirt.fi https://www.health.InvestmentBrowse.at  Goals: -get up every 30 minutes to at least stretch -take a sip of fluid every 10 minutes to help reach your fluid needs every day -meet your 60 grams of protein with a supplement every day and still eat solids throughout the day as you have not met your needs from your surgery date -add in non starchy vegetables for variety but cook well before trying raw vegetables  -try sugar free jello in your cottage cheese  Handouts Provided Include  Phase 4  Learning Style & Readiness for Change Teaching method utilized: Visual & Auditory  Demonstrated degree of understanding via: Teach Back  Readiness Level: Action Barriers to learning/adherence to lifestyle change: general weakness  RD's Notes for Next Visit Assess adherence to pt chosen goals    MONITORING & EVALUATION Dietary intake, weekly physical activity, body weight  Next Steps Patient is to follow-up in one month

## 2021-12-13 DIAGNOSIS — I8001 Phlebitis and thrombophlebitis of superficial vessels of right lower extremity: Secondary | ICD-10-CM | POA: Diagnosis not present

## 2021-12-13 DIAGNOSIS — D6869 Other thrombophilia: Secondary | ICD-10-CM | POA: Diagnosis not present

## 2021-12-13 DIAGNOSIS — I480 Paroxysmal atrial fibrillation: Secondary | ICD-10-CM | POA: Diagnosis not present

## 2021-12-13 DIAGNOSIS — I10 Essential (primary) hypertension: Secondary | ICD-10-CM | POA: Diagnosis not present

## 2021-12-13 DIAGNOSIS — Z9884 Bariatric surgery status: Secondary | ICD-10-CM | POA: Diagnosis not present

## 2021-12-13 DIAGNOSIS — R7303 Prediabetes: Secondary | ICD-10-CM | POA: Diagnosis not present

## 2021-12-13 DIAGNOSIS — D509 Iron deficiency anemia, unspecified: Secondary | ICD-10-CM | POA: Diagnosis not present

## 2021-12-23 ENCOUNTER — Telehealth: Payer: Self-pay | Admitting: Skilled Nursing Facility1

## 2021-12-23 NOTE — Telephone Encounter (Signed)
Received labs for pt via fax, all WNL except A1C of 4.7

## 2021-12-23 NOTE — Progress Notes (Unsigned)
Office Visit    Patient Name: [name] Date of Encounter: 12/23/2021  Primary Care Provider:  Harlan Stains, MD Primary Cardiologist:  Werner Lean, MD  Chief Complaint    66 year old female with a history of hypertension, aortic atherosclerosis, permanent atrial fibrillation on chronic anticoagulation, DVT, morbid obesity, osteomyelitis, and OSA who presents for follow-up related to atrial fibrillation.  Past Medical History    Past Medical History:  Diagnosis Date   Arthritis    Atrial fibrillation (HCC)    Chronic lower back pain    Depression    DVT (deep venous thrombosis) (HCC) 1990s   LLE   Dyspnea    Fallen arches    Family history of adverse reaction to anesthesia    Mother had rash and itching after anesthesia   Headache    Hypertension    Hyperthyroidism ~ 2000   "fine now" (04/23/2016)   Joint pain    Lactose intolerance    Leg edema    Obesity    Pneumonia 1980s X 1   Snoring    has not had sleep study but suspects sleep apnea   Past Surgical History:  Procedure Laterality Date   BIOPSY  08/09/2021   Procedure: BIOPSY;  Surgeon: Felicie Morn, MD;  Location: [facility];  Service: General;;   CARDIOVERSION N/A 06/18/2016   Procedure: CARDIOVERSION;  Surgeon: Larey Dresser, MD;  Location: [facility];  Service: Cardiovascular;  Laterality: N/A;   CARDIOVERSION N/A 07/11/2016   Procedure: CARDIOVERSION;  Surgeon: Lelon Perla, MD;  Location: [facility];  Service: Cardiovascular;  Laterality: N/A;   CARDIOVERSION N/A 07/02/2017   Procedure: CARDIOVERSION;  Surgeon: Sanda Klein, MD;  Location: [place] [facility];  Service: Cardiovascular;  Laterality: N/A;   CARDIOVERSION N/A 09/29/2017   Procedure: CARDIOVERSION;  Surgeon: Larey Dresser, MD;  Location: [facility];  Service: Cardiovascular;  Laterality: N/A;   ENDOMETRIAL ABLATION  ~2006   ESOPHAGOGASTRODUODENOSCOPY N/A 08/09/2021   Procedure:  ESOPHAGOGASTRODUODENOSCOPY (EGD);  Surgeon: Felicie Morn, MD;  Location: [facility];  Service: General;  Laterality: N/A;   HIATAL HERNIA REPAIR N/A 10/08/2021   Procedure: HERNIA REPAIR HIATAL;  Surgeon: Clovis Riley, MD;  Location: [facility];  Service: General;  Laterality: N/A;   IR GUIDED DRAIN W CATHETER PLACEMENT  10/18/2021   LAPAROSCOPIC GASTRIC SLEEVE RESECTION N/A 10/08/2021   Procedure: LAPAROSCOPIC GASTRIC SLEEVE RESECTION;  Surgeon: Clovis Riley, MD;  Location: [facility];  Service: General;  Laterality: N/A;   TONSILLECTOMY  1960s   TUBAL LIGATION  1979   UPPER GI ENDOSCOPY N/A 10/08/2021   Procedure: UPPER GI ENDOSCOPY;  Surgeon: Clovis Riley, MD;  Location: [facility];  Service: General;  Laterality: N/A;    Allergies  Allergies  Allergen Reactions   Amlodipine Besylate Swelling    Other reaction(s): edema at 10 mg dose   Codeine Hives   Triamcinolone Nausea Only   Latex Itching and Rash    History of Present Illness    66 year old female with the above past medical history including hypertension, aortic atherosclerosis, permanent atrial fibrillation on chronic anticoagulation, DVT, morbid obesity, osteomyelitis, and OSA.   She has a history of osteomyelitis of her right foot requiring multiple surgeries which has resulted in limited mobility. He has followed routinely with Dr. Rayann Heman in the atrial fibrillation clinic.  She has failed Multaq and flecainide.  On Xarelto.  Stress test in April 2020 in the setting of atypical chest pain showed  no evidence of ischemia.  Echocardiogram at the time showed EF 55 to 60%, no RWMA, moderate LVH, normal PASP, LA severely dilated, mild to moderate MR.  She requested further work-up with cardiac CTA but was not thought to be a good candidate due to atrial fibrillation and BMI.   She visited the ED in April 2022 with chest pain.  CT angio of the chest aorta at the time showed mild aneurysmal dilation to 4.2 cm in the  ascending aorta with recommended annual imaging, aortic atherosclerosis, coronary arteries on report.  She was hospitalized later in April 2022 for chest pain, EKG was unremarkable and troponin was negative. Her chest pain was determined to be musculoskeletal from the use of her cane with her left arm.   She was last seen in the atrial fibrillation clinic on 11/13/2021 and was stable from a cardiac standpoint, her heart rate was in the low 100s, and metoprolol was increased to 25 mg twice daily.   Permanent atrial fibrillation: BB recently increased.   Aortic atherosclerosis/dilation of ascending aorta: Noted on prior CT. Hypertension:  Hyperlipidemia: OSA: Obesity: S/p laparoscopic sleeve gastrectomy on 10/08/2021   Home Medications    Current Outpatient Medications  Medication Sig Dispense Refill   acetaminophen (TYLENOL) 500 MG tablet Take 2 tablets (1,000 mg total) by mouth every 6 (six) hours as needed for mild pain, fever or headache.     albuterol (VENTOLIN HFA) 108 (90 Base) MCG/ACT inhaler Inhale 1-2 puffs into the lungs every 6 (six) hours as needed for shortness of breath or wheezing.     amphetamine-dextroamphetamine (ADDERALL XR) 15 MG 24 hr capsule Take 15 mg by mouth 2 (two) times daily.     Ascorbic Acid (VITAMIN C GUMMIE PO) Take 2 tablets by mouth daily.     calcium carbonate (TUMS - DOSED IN MG ELEMENTAL CALCIUM) 500 MG chewable tablet Chew 1 tablet by mouth daily.     DULoxetine (CYMBALTA) 30 MG capsule Take 60 mg by mouth daily.     gabapentin (NEURONTIN) 400 MG capsule Take 400 mg by mouth 3 (three) times daily.     metoprolol tartrate (LOPRESSOR) 25 MG tablet Take 1 tablet (25 mg total) by mouth 2 (two) times daily. 180 tablet 1   ondansetron (ZOFRAN-ODT) 4 MG disintegrating tablet Dissolve 1 tablet (4 mg total) by mouth every 6 (six) hours as needed for nausea or vomiting. 20 tablet 0   pantoprazole (PROTONIX) 40 MG tablet Take 1 tablet (40 mg total) by mouth daily.  90 tablet 0   XARELTO 20 MG TABS tablet TAKE 1 TABLET(20 MG) BY MOUTH DAILY WITH SUPPER 90 tablet 1   No current facility-administered medications for this visit.     Review of Systems    ***.  All other systems reviewed and are otherwise negative except as noted above.    Physical Exam    VS:  There were no vitals taken for this visit. , BMI There is no height or weight on file to calculate BMI.     GEN: Well nourished, well developed, in no acute distress. HEENT: normal. Neck: Supple, no JVD, carotid bruits, or masses. Cardiac: RRR, no murmurs, rubs, or gallops. No clubbing, cyanosis, edema.  Radials/DP/PT 2+ and equal bilaterally.  Respiratory:  Respirations regular and unlabored, clear to auscultation bilaterally. GI: Soft, nontender, nondistended, BS + x 4. MS: no deformity or atrophy. Skin: warm and dry, no rash. Neuro:  Strength and sensation are intact. Psych: Normal affect.  Accessory Clinical Findings    ECG personally reviewed by me today - *** - no acute changes.  Lab Results  Component Value Date   WBC 10.6 (H) 10/23/2021   HGB 8.3 (L) 10/23/2021   HCT 25.6 (L) 10/23/2021   MCV 85.9 10/23/2021   PLT 481 (H) 10/23/2021   Lab Results  Component Value Date   CREATININE 0.87 10/18/2021   BUN 16 10/18/2021   NA 136 10/18/2021   K 4.2 10/18/2021   CL 106 10/18/2021   CO2 19 (L) 10/18/2021   Lab Results  Component Value Date   ALT 23 10/17/2021   AST 16 10/17/2021   ALKPHOS 33 (L) 10/17/2021   BILITOT 1.4 (H) 10/17/2021   Lab Results  Component Value Date   CHOL 163 02/24/2021   HDL 72 02/24/2021   LDLCALC 82 02/24/2021   TRIG 47 02/24/2021   CHOLHDL 2.3 02/24/2021    Lab Results  Component Value Date   HGBA1C 6.0 (H) 09/24/2021    Assessment & Plan    1.  ***   Lenna Sciara, NP 12/23/2021, 11:19 AM

## 2021-12-24 ENCOUNTER — Ambulatory Visit (HOSPITAL_BASED_OUTPATIENT_CLINIC_OR_DEPARTMENT_OTHER): Payer: Medicare Other | Admitting: Nurse Practitioner

## 2021-12-27 ENCOUNTER — Telehealth: Payer: Self-pay | Admitting: *Deleted

## 2021-12-27 NOTE — Telephone Encounter (Signed)
Received a paper Xarelto refill request. The xarelto was last sent on 10/03/2021 to [facility] for 90 day supply and 1 refill. This paper refill was from the local [facility]. Therefore, need to clarify if she needs a Xarelto refill sent locally or not. Called pt but there was no answer so left her a message to call me back. ? ?Pt called back within minutes and states not to send the Xarelto refill locally as she is getting it at a reduced price from [facility]. Advised I will not send. I called the pharmacy to let them know she is filling this at a [facility] pharmacy and she stated they remove it for refills on this med only.  ? ?

## 2021-12-31 ENCOUNTER — Encounter: Payer: Medicare Other | Attending: Family Medicine | Admitting: Skilled Nursing Facility1

## 2021-12-31 ENCOUNTER — Other Ambulatory Visit: Payer: Self-pay

## 2021-12-31 DIAGNOSIS — Z9884 Bariatric surgery status: Secondary | ICD-10-CM | POA: Diagnosis not present

## 2021-12-31 DIAGNOSIS — I10 Essential (primary) hypertension: Secondary | ICD-10-CM | POA: Insufficient documentation

## 2021-12-31 DIAGNOSIS — Z86718 Personal history of other venous thrombosis and embolism: Secondary | ICD-10-CM | POA: Insufficient documentation

## 2021-12-31 DIAGNOSIS — Z6841 Body Mass Index (BMI) 40.0 and over, adult: Secondary | ICD-10-CM | POA: Diagnosis present

## 2021-12-31 DIAGNOSIS — G4730 Sleep apnea, unspecified: Secondary | ICD-10-CM | POA: Diagnosis not present

## 2021-12-31 DIAGNOSIS — Z713 Dietary counseling and surveillance: Secondary | ICD-10-CM | POA: Insufficient documentation

## 2021-12-31 NOTE — Progress Notes (Signed)
Bariatric Nutrition Follow-Up Visit ?Medical Nutrition Therapy  ? ? ?NUTRITION ASSESSMENT ?  ?Surgery date: 10/08/2021 ?Surgery type: sleeve ?Start weight at [facility]: 327.9 ?Weight today: 276 pounds ? ?Clinical  ?Medical hx: DVT, HTN, depression, sleep apnea ?Medications: see list  ?Labs: A1C 4.7 ?Notable signs/symptoms: pain, limited mobility, diarrhea not often ?Any previous deficiencies? No ?  ?Lifestyle & Dietary Hx ?  ? ?Pt does not report any shakiness, tiredness, or sweaty but states she has been dizzy sometimes randomly but not often.  ?Pt states she takes her grandchildren to school. Pt states she wakes hungry.  ?Pt states she is not liking cheese right now.  ?Pt states she does not need protein shakes because she is eating really well now.  ?Pt states she is better with her fluid consumption.  ?Pt sates she really wants to add in fruit and crackers: Dietitian advised pt these foods have the ability to slow her weight loss: pt states that is not her concern she wants to add these thing sin regardless pt did agree to avoid starchy vegetables for now.  ? ?Pt states she was in her chair 80% of the day but now only maybe 20%.  ? ?Pt states she cannot tolerate the bari multi but might try it again 2 months from now . ? ?Pt states she never listened to her body before surgery and has now learned to listen to it.  ? ? ?Estimated daily fluid intake: 40-60 oz ?Estimated daily protein intake: 40 g ?Supplements: not taking  ?Current average weekly physical activity: starting to do things around the house again  ? ?24-Hr Dietary Recall ?First Meal 9am: lunch meat ?Snack: lunch meat or boiled egg ?Second Meal: 3 ounces stew beef or ground sirloin burger wrapped in lettuce + red onion ?Snack : beans ?Third Meal: chicken + lettuce + salsa + beans + sour cream ?Snack:  ?Beverages: water ? ?Post-Op Goals/ Signs/ Symptoms ?Using straws: no ?Drinking while eating: no ?Chewing/swallowing difficulties: no ?Changes in vision:  no ?Changes to mood/headaches: no ?Hair loss/changes to skin/nails: no ?Difficulty focusing/concentrating: no ?Sweating: no ?Limb weakness: no ?Dizziness/lightheadedness: no ?Palpitations: no  ?Carbonated/caffeinated beverages: no ?N/V/D/C/Gas: better ?Abdominal pain: no ?Dumping syndrome: no ? ?  ?NUTRITION DIAGNOSIS  ?Overweight/obesity ([place]-3.3) related to past poor dietary habits and physical inactivity as evidenced by completed bariatric surgery and following dietary guidelines for continued weight loss and healthy nutrition status. ?  ?  ?NUTRITION INTERVENTION ?Nutrition counseling (C-1) and education (E-2) to facilitate bariatric surgery goals, including: ?The importance of consuming adequate calories as well as certain nutrients daily due to the body's need for essential vitamins, minerals, and fats ?The importance of intuitive eating specifically learning hunger-satiety cues and understanding the importance of learning a new body: The importance of mindful eating to avoid grazing behaviors: PORTION SIZES  ? ?Goals: ?-try some herbal teas to help with your fluid status  ? ?Handouts Provided Include  ?Phase 5 ? ?Learning Style & Readiness for Change ?Teaching method utilized: Visual & Auditory  ?Demonstrated degree of understanding via: Teach Back  ?Readiness Level: Action ?Barriers to learning/adherence to lifestyle change: general weakness ? ?RD's Notes for Next Visit ?Assess adherence to pt chosen goals  ? ? ?MONITORING & EVALUATION ?Dietary intake, weekly physical activity, body weight ? ?Next Steps ?Patient is to follow-up in 2 months ?

## 2022-01-16 ENCOUNTER — Other Ambulatory Visit: Payer: Self-pay

## 2022-01-16 DIAGNOSIS — I872 Venous insufficiency (chronic) (peripheral): Secondary | ICD-10-CM

## 2022-01-17 ENCOUNTER — Ambulatory Visit (HOSPITAL_COMMUNITY)
Admission: RE | Admit: 2022-01-17 | Discharge: 2022-01-17 | Disposition: A | Discharge status: Home / Self Care | Payer: Medicare Other | Source: Ambulatory Visit | Attending: Vascular Surgery | Admitting: Vascular Surgery

## 2022-01-17 ENCOUNTER — Other Ambulatory Visit: Payer: Self-pay

## 2022-01-17 DIAGNOSIS — I872 Venous insufficiency (chronic) (peripheral): Secondary | ICD-10-CM | POA: Diagnosis not present

## 2022-01-29 ENCOUNTER — Ambulatory Visit: Payer: Medicare Other | Admitting: Vascular Surgery

## 2022-01-29 ENCOUNTER — Encounter: Payer: Self-pay | Admitting: Vascular Surgery

## 2022-01-29 VITALS — BP 164/99 | HR 68 | Temp 97.9°F | Resp 20 | Ht 62.0 in | Wt 266.0 lb

## 2022-01-29 DIAGNOSIS — I872 Venous insufficiency (chronic) (peripheral): Secondary | ICD-10-CM

## 2022-01-29 NOTE — Progress Notes (Signed)
? ?Patient ID: [name], female   DOB: 1955-10-30, 66 y.o.   MRN: [id number] ? ?Reason for Consult: No chief complaint on file. ?  ?Referred by Harlan Stains, MD ? ?Subjective:  ?   ?HPI: ? ?[name] is a 66 y.o. female with a history of DVT of the left lower extremity more recently was diagnosed with superficial venous thrombosis of the right lower extremity.  She is recent undergone gastric sleeve and has been losing weight since that time.  She does walk without a cane although has a walker today.  She has chronic swelling of the right greater than the left lower extremity but varicose veins on the left.  She does have atrial fibrillation for which she is anticoagulated.  She does not have any tissue loss or ulceration of the lower extremities. ? ?Past Medical History:  ?Diagnosis Date  ? Arthritis   ? Atrial fibrillation ([place])   ? Chronic lower back pain   ? Depression   ? DVT (deep venous thrombosis) ([place]) 1990s  ? LLE  ? Dyspnea   ? Fallen arches   ? Family history of adverse reaction to anesthesia   ? Mother had rash and itching after anesthesia  ? Headache   ? Hypertension   ? Hyperthyroidism ~ 2000  ? "fine now" (04/23/2016)  ? Joint pain   ? Lactose intolerance   ? Leg edema   ? Obesity   ? Pneumonia 1980s X 1  ? Snoring   ? has not had sleep study but suspects sleep apnea  ? ?Family History  ?Problem Relation Age of Onset  ? Diabetes Mellitus I Father   ? CVA Father   ? Arthritis Mother   ? Obesity Mother   ? Thyroid disease Mother   ? Liver disease Mother   ? CAD Maternal Grandfather   ? ?Past Surgical History:  ?Procedure Laterality Date  ? BIOPSY  08/09/2021  ? Procedure: BIOPSY;  Surgeon: Felicie Morn, MD;  Location: [facility];  Service: General;;  ? CARDIOVERSION N/A 06/18/2016  ? Procedure: CARDIOVERSION;  Surgeon: Larey Dresser, MD;  Location: [place];  Service: Cardiovascular;  Laterality: N/A;  ? CARDIOVERSION N/A 07/11/2016  ? Procedure: CARDIOVERSION;  Surgeon:  Lelon Perla, MD;  Location: [place];  Service: Cardiovascular;  Laterality: N/A;  ? CARDIOVERSION N/A 07/02/2017  ? Procedure: CARDIOVERSION;  Surgeon: Sanda Klein, MD;  Location: [place];  Service: Cardiovascular;  Laterality: N/A;  ? CARDIOVERSION N/A 09/29/2017  ? Procedure: CARDIOVERSION;  Surgeon: Larey Dresser, MD;  Location: [facility];  Service: Cardiovascular;  Laterality: N/A;  ? ENDOMETRIAL ABLATION  ~2006  ? ESOPHAGOGASTRODUODENOSCOPY N/A 08/09/2021  ? Procedure: ESOPHAGOGASTRODUODENOSCOPY (EGD);  Surgeon: Felicie Morn, MD;  Location: [facility];  Service: General;  Laterality: N/A;  ? HIATAL HERNIA REPAIR N/A 10/08/2021  ? Procedure: HERNIA REPAIR HIATAL;  Surgeon: Clovis Riley, MD;  Location: [facility];  Service: General;  Laterality: N/A;  ? IR GUIDED DRAIN W CATHETER PLACEMENT  10/18/2021  ? LAPAROSCOPIC GASTRIC SLEEVE RESECTION N/A 10/08/2021  ? Procedure: LAPAROSCOPIC GASTRIC SLEEVE RESECTION;  Surgeon: Clovis Riley, MD;  Location: [facility];  Service: General;  Laterality: N/A;  ? TONSILLECTOMY  1960s  ? TUBAL LIGATION  1979  ? UPPER GI ENDOSCOPY N/A 10/08/2021  ? Procedure: UPPER GI ENDOSCOPY;  Surgeon: Clovis Riley, MD;  Location: [facility];  Service: General;  Laterality: N/A;  ? ? ?Short Social History:  ?Social  History  ? ?Tobacco Use  ? Smoking status: Former  ?  Packs/day: 1.00  ?  Years: 10.00  ?  Pack years: 10.00  ?  Types: Cigarettes  ?  Quit date: 10/28/1987  ?  Years since quitting: 34.2  ? Smokeless tobacco: Never  ? Tobacco comments:  ?  Former smoker 11/13/2021  ?Substance Use Topics  ? Alcohol use: Not Currently  ?  Alcohol/week: 4.0 standard drinks  ?  Types: 4 Glasses of wine per week  ?  Comment: recently quit  ? ? ?Allergies  ?Allergen Reactions  ? Amlodipine Besylate Swelling  ?  Other reaction(s): edema at 10 mg dose  ? Codeine Hives  ? Triamcinolone Nausea Only  ? Latex Itching and Rash  ? ? ?Current Outpatient Medications  ?Medication Sig  Dispense Refill  ? acetaminophen (TYLENOL) 500 MG tablet Take 2 tablets (1,000 mg total) by mouth every 6 (six) hours as needed for mild pain, fever or headache.    ? albuterol (VENTOLIN HFA) 108 (90 Base) MCG/ACT inhaler Inhale 1-2 puffs into the lungs every 6 (six) hours as needed for shortness of breath or wheezing.    ? amphetamine-dextroamphetamine (ADDERALL XR) 15 MG 24 hr capsule Take 15 mg by mouth 2 (two) times daily.    ? Ascorbic Acid (VITAMIN C GUMMIE PO) Take 2 tablets by mouth daily.    ? calcium carbonate (TUMS - DOSED IN MG ELEMENTAL CALCIUM) 500 MG chewable tablet Chew 1 tablet by mouth daily.    ? DULoxetine (CYMBALTA) 30 MG capsule Take 60 mg by mouth daily.    ? gabapentin (NEURONTIN) 400 MG capsule Take 400 mg by mouth 3 (three) times daily.    ? metoprolol tartrate (LOPRESSOR) 25 MG tablet Take 1 tablet (25 mg total) by mouth 2 (two) times daily. 180 tablet 1  ? ondansetron (ZOFRAN-ODT) 4 MG disintegrating tablet Dissolve 1 tablet (4 mg total) by mouth every 6 (six) hours as needed for nausea or vomiting. 20 tablet 0  ? pantoprazole (PROTONIX) 40 MG tablet Take 1 tablet (40 mg total) by mouth daily. 90 tablet 0  ? XARELTO 20 MG TABS tablet TAKE 1 TABLET(20 MG) BY MOUTH DAILY WITH SUPPER 90 tablet 1  ? ?No current facility-administered medications for this visit.  ? ? ?Review of Systems  ?Constitutional:  ?     Weight loss  ?HENT: HENT negative.  ?Eyes: Eyes negative.  ?Respiratory: Respiratory negative.  ?Cardiovascular: Positive for leg swelling.  ?GI: Gastrointestinal negative.  ?Musculoskeletal: Musculoskeletal negative.  ?Skin: Skin negative.  ?Neurological: Neurological negative. ?Hematologic: Hematologic/lymphatic negative.  ?Psychiatric: Psychiatric negative.   ? ?   ?Objective:  ?Objective  ?Vitals:  ? 01/29/22 1002  ?BP: (!) 164/99  ?Pulse: 68  ?Resp: 20  ?Temp: 97.9 ?F (36.6 ?C)  ?SpO2: 96%  ? ? ? ?Physical Exam ?HENT:  ?   Head: Normocephalic.  ?   Nose: Nose normal.  ?Eyes:  ?    Pupils: Pupils are equal, round, and reactive to light.  ?Cardiovascular:  ?   Rate and Rhythm: Normal rate.  ?Abdominal:  ?   General: Abdomen is flat.  ?   Palpations: Abdomen is soft.  ?Musculoskeletal:  ?   Right lower leg: Edema present.  ?   Left lower leg: Edema present.  ?Skin: ?   General: Skin is warm and dry.  ?   Capillary Refill: Capillary refill takes less than 2 seconds.  ?Neurological:  ?   General: No  focal deficit present.  ?   Mental Status: She is alert.  ?Psychiatric:     ?   Mood and Affect: Mood normal.     ?   Behavior: Behavior normal.     ?   Thought Content: Thought content normal.  ? ? ?Data: ?Venous Reflux Times  ?+----------------+---------+------+-----------+------------+---------------  ?----+  ?RIGHT           Reflux NoRefluxReflux TimeDiameter cmsComments        ?      ?                          Yes                                         ?      ?+----------------+---------+------+-----------+------------+---------------  ?----+  ?CFV                       yes   >1 second                             ?      ?+----------------+---------+------+-----------+------------+---------------  ?----+  ?FV mid          no                                                    ?      ?+----------------+---------+------+-----------+------------+---------------  ?----+  ?Popliteal       no                                                    ?      ?+----------------+---------+------+-----------+------------+---------------  ?----+  ?GSV at [facility]                yes    >500 ms      0.92                    ?      ?+----------------+---------+------+-----------+------------+---------------  ?----+  ?GSV prox thigh            yes    >500 ms      0.68                    ?      ?+----------------+---------+------+-----------+------------+---------------  ?----+  ?GSV mid thigh             yes    >500 ms      0.47                    ?       ?+----------------+---------+------+-----------+------------+---------------  ?----+  ?GSV dist thigh            yes    >500 ms      0.52                    ?      ?+----------------+---------+------+-----------+------------+---------------  ?----+  ?GSV  at knee

## 2022-02-12 ENCOUNTER — Telehealth: Payer: Self-pay | Admitting: Internal Medicine

## 2022-02-12 ENCOUNTER — Ambulatory Visit (HOSPITAL_COMMUNITY)
Admission: RE | Admit: 2022-02-12 | Discharge: 2022-02-12 | Disposition: A | Discharge status: Home / Self Care | Payer: Medicare Other | Source: Ambulatory Visit | Attending: Surgery | Admitting: Surgery

## 2022-02-12 DIAGNOSIS — I7121 Aneurysm of the ascending aorta, without rupture: Secondary | ICD-10-CM | POA: Insufficient documentation

## 2022-02-12 MED ORDER — IOHEXOL 350 MG/ML SOLN
80.0000 mL | Freq: Once | INTRAVENOUS | Status: AC | PRN
  Administered 2022-02-12: 80 mL via INTRAVENOUS

## 2022-02-12 MED ORDER — SODIUM CHLORIDE (PF) 0.9 % IJ SOLN
INTRAMUSCULAR | Status: AC
  Filled 2022-02-12: qty 50

## 2022-02-12 NOTE — Telephone Encounter (Signed)
**Note De-Identified Virgina Deakins Obfuscation** I gave the pt [name] [employer] phone number so she can call to re-enroll in their Xarelto program. ? ?She stated that she only has 2 Xarelto tablets currently. ?She is aware that we are leaving her 1 week of Xarelto samples in the front office at our [facility] office at [facility] [address] (Dr Jenel Lucks) for her to pick up. ? ?She thanked me for our assistance. ?

## 2022-02-12 NOTE — Telephone Encounter (Signed)
?  Pt c/o medication issue: ? ?1. Name of Medication: XARELTO 20 MG TABS tablet ? ?2. How are you currently taking this medication (dosage and times per day)? TAKE 1 TABLET(20 MG) BY MOUTH DAILY WITH SUPPER ? ?3. Are you having a reaction (difficulty breathing--STAT)?  ? ?4. What is your medication issue? Pt is calling, she said,, she needs help on re-enrolling to her pt assistance for her xarelto. Pt has CT at 1 pm. She said to call her back after 2 pm today ?

## 2022-02-14 DIAGNOSIS — I480 Paroxysmal atrial fibrillation: Secondary | ICD-10-CM | POA: Diagnosis not present

## 2022-02-14 DIAGNOSIS — I10 Essential (primary) hypertension: Secondary | ICD-10-CM | POA: Diagnosis not present

## 2022-02-17 ENCOUNTER — Ambulatory Visit (HOSPITAL_BASED_OUTPATIENT_CLINIC_OR_DEPARTMENT_OTHER): Payer: Medicare Other | Admitting: Family

## 2022-02-17 ENCOUNTER — Ambulatory Visit (INDEPENDENT_AMBULATORY_CARE_PROVIDER_SITE_OTHER): Payer: Medicare Other

## 2022-02-17 ENCOUNTER — Encounter (HOSPITAL_BASED_OUTPATIENT_CLINIC_OR_DEPARTMENT_OTHER): Payer: Self-pay | Admitting: Family

## 2022-02-17 VITALS — BP 146/100 | HR 68 | Ht 62.0 in | Wt 260.4 lb

## 2022-02-17 DIAGNOSIS — I493 Ventricular premature depolarization: Secondary | ICD-10-CM

## 2022-02-17 DIAGNOSIS — G4733 Obstructive sleep apnea (adult) (pediatric): Secondary | ICD-10-CM

## 2022-02-17 DIAGNOSIS — I700 Atherosclerosis of aorta: Secondary | ICD-10-CM

## 2022-02-17 DIAGNOSIS — E782 Mixed hyperlipidemia: Secondary | ICD-10-CM

## 2022-02-17 DIAGNOSIS — I4821 Permanent atrial fibrillation: Secondary | ICD-10-CM | POA: Diagnosis not present

## 2022-02-17 DIAGNOSIS — I7121 Aneurysm of the ascending aorta, without rupture: Secondary | ICD-10-CM | POA: Diagnosis not present

## 2022-02-17 DIAGNOSIS — D6859 Other primary thrombophilia: Secondary | ICD-10-CM | POA: Diagnosis not present

## 2022-02-17 MED ORDER — XARELTO 20 MG PO TABS: ORAL_TABLET | ORAL | 1 refills | Status: DC

## 2022-02-17 NOTE — Progress Notes (Signed)
? ?Office Visit  ?  ?Patient Name: [name] ?Date of Encounter: 02/18/2022 ? ?PCP:  Harlan Stains, MD ?  ?[place]  ?Cardiologist:  Werner Lean, MD  ?Advanced Practice Provider:  No care team member to display ?Electrophysiologist:  Thompson Grayer, MD  ? ? ?Chief Complaint  ?  ?[name] is a 66 y.o. female with a hx of hypertension, aortic atherosclerosis, DVT, permanent atrial fibrillation on chronic anticoagulation, PVC, morbid obesity, osteomyelitis, OSA presents today for preoperative cardiovascular clearance ? ?Past Medical History  ?  ?Past Medical History:  ?Diagnosis Date  ? Arthritis   ? Atrial fibrillation ([place])   ? Chronic lower back pain   ? Depression   ? DVT (deep venous thrombosis) ([place]) 1990s  ? LLE  ? Dyspnea   ? Fallen arches   ? Family history of adverse reaction to anesthesia   ? Mother had rash and itching after anesthesia  ? Headache   ? Hypertension   ? Hyperthyroidism ~ 2000  ? "fine now" (04/23/2016)  ? Joint pain   ? Lactose intolerance   ? Leg edema   ? Obesity   ? Pneumonia 1980s X 1  ? Snoring   ? has not had sleep study but suspects sleep apnea  ? ?Past Surgical History:  ?Procedure Laterality Date  ? BIOPSY  08/09/2021  ? Procedure: BIOPSY;  Surgeon: Felicie Morn, MD;  Location: [facility];  Service: General;;  ? CARDIOVERSION N/A 06/18/2016  ? Procedure: CARDIOVERSION;  Surgeon: Larey Dresser, MD;  Location: [place];  Service: Cardiovascular;  Laterality: N/A;  ? CARDIOVERSION N/A 07/11/2016  ? Procedure: CARDIOVERSION;  Surgeon: Lelon Perla, MD;  Location: [place];  Service: Cardiovascular;  Laterality: N/A;  ? CARDIOVERSION N/A 07/02/2017  ? Procedure: CARDIOVERSION;  Surgeon: Sanda Klein, MD;  Location: [place];  Service: Cardiovascular;  Laterality: N/A;  ? CARDIOVERSION N/A 09/29/2017  ? Procedure: CARDIOVERSION;  Surgeon: Larey Dresser, MD;  Location: [facility];  Service: Cardiovascular;   Laterality: N/A;  ? ENDOMETRIAL ABLATION  ~2006  ? ESOPHAGOGASTRODUODENOSCOPY N/A 08/09/2021  ? Procedure: ESOPHAGOGASTRODUODENOSCOPY (EGD);  Surgeon: Felicie Morn, MD;  Location: [facility];  Service: General;  Laterality: N/A;  ? HIATAL HERNIA REPAIR N/A 10/08/2021  ? Procedure: HERNIA REPAIR HIATAL;  Surgeon: Clovis Riley, MD;  [facility];  Service: General;  Laterality: N/A;  ? IR GUIDED DRAIN W CATHETER PLACEMENT  10/18/2021  ? LAPAROSCOPIC GASTRIC SLEEVE RESECTION N/A 10/08/2021  ? Procedure: LAPAROSCOPIC GASTRIC SLEEVE RESECTION;  Surgeon: Clovis Riley, MD;  Location: [facility];  Service: General;  Laterality: N/A;  ? TONSILLECTOMY  1960s  ? TUBAL LIGATION  1979  ? UPPER GI ENDOSCOPY N/A 10/08/2021  ? Procedure: UPPER GI ENDOSCOPY;  Surgeon: Clovis Riley, MD;  [facility];  Service: General;  Laterality: N/A;  ? ? ?Allergies ? ?Allergies  ?Allergen Reactions  ? Amlodipine Besylate Swelling  ?  Other reaction(s): edema at 10 mg dose  ? Codeine Hives  ? Triamcinolone Nausea Only  ? Latex Itching and Rash  ? ? ?History of Present Illness  ?  ?[name] is a 66 y.o. female with a hx of hypertension, DVT, permanent atrial fibrillation on chronic anticoagulation, PVC, morbid obesity, osteomyelitis, OSA, aortic atherosclerosis last seen 11/13/21 by atrial fib clinic ? ?Previous history of osteomyelitis of her right foot requiring multiple surgeries in 2019 and immobility  ? ?She has followed  routinely with Dr. Rayann Heman in regards to her atrial fibrillation.  Previous work-up for atypical chest pain includes stress test April 2021 with no evidence of ischemia.  Echocardiogram April 2021 LVEF 55-60%, no R WMA, moderate LVH, normal PASP, LA severely dilated, mild to moderate MR.  The patient did request further work-up with cardiac CTA but was discussed with cardiology reader team who did not think with her atrial fibrillation and BMI that diagnostic images would be able to be  obtained.  ? ?ED visit 02/09/2021 for chest pain with CT angio chest aorta showing mild aneurysmal dilation to 4.2 cm in the ascending aorta recommended for annual imaging, mild dependent atelectatic changes in the lungs bilaterally, aortic atherosclerosis.  Report does not metion coronary atherosclerosis.  Subsequent hospitalization 02/23/2021 for chest pain ongoing for 3 weeks under left breast with radiation to back and left arm.  HS troponin negative x2. Her chest pain resolved was determined to be musculoskeletal from use of her cane with her left arm. ? ?Evaluated July 2022 for preop clearance for bariatric surgery.  Doing well from a cardiac perspective and clearance provided.  She was awaiting to be set up for BiPAP for sleep study showed sleep apnea.   Then seen by Dr. Rayann Heman 09/2021 with minimal symptoms in regards to her atrial fibrillation and recommended for EP follow-up in 1 year. ? ?Admitted 10/08/2021 for planned laparoscopic gastric sleeve resection as well as hiatal hernia repair.  Discharged 48 hours later.  HCTZ was discontinued.  Postoperative course unremarkable.  However subsequently readmitted 10/18/11/28/22 for postoperative abdominal wall hematoma.  She was transfused 2 units PRBC.  Due to tachycardia cardiology team increased her Lopressor from 12.5 to 25 mg twice daily.  Xarelto was able to be resumed prior to discharge. ? ?Saw atrial fibrillation clinic 11/13/2021.  She denied chest pain or dyspnea or palpitations.  Felt she was recovering somewhat slowly from her surgery. ? ?Repeat CT chest 02/12/2022 revealed stable 4.2 cm ascending thoracic aortic aneurysm as well as known aortic atherosclerosis. ? ?Presents today for follow-up.  Denies shifting of pressure, tightness.  Endorses her exertional dyspnea is overall stable compared to previous.  Continues to try to increase her physical activity after bariatric surgery.  She notes occasional dizziness described as spinning when she lays down  or turns her head certain direction.  We discussed likely etiology in her ear versus vertigo and encouraged to discuss with primary care provider.  She notes lower extremity edema worse at end of day and resolved by morning.  She was evaluated by vascular surgery 01/29/2021 with reflux of right greater saphenous vein as well as accessory saphenous vein with notable thrombus in the superficial vein which was chronic in nature.  Has not been taking her Adderall since hospital discharge and this is usually followed with psychiatry.  Not monitoring blood pressure routinely at home. ? ?EKGs/Labs/Other Studies Reviewed:  ? ?The following studies were reviewed today: ? ?Echo 02/23/20 ? 1. Left ventricular ejection fraction, by estimation, is 55 to 60%. The  ?left ventricle has normal function. The left ventricle has no regional  ?wall motion abnormalities. There is moderate left ventricular hypertrophy.  ?Left ventricular diastolic function  ? could not be evaluated.  ? 2. Right ventricular systolic function is normal. The right ventricular  ?size is normal. There is normal pulmonary artery systolic pressure.  ? 3. Left atrial size was severely dilated.  ? 4. The mitral valve is abnormal. Mild to moderate mitral valve  ?  regurgitation.  ? 5. The aortic valve is tricuspid. Aortic valve regurgitation is not  ?visualized.  ? 6. Aortic dilatation noted. There is mild dilatation of the ascending  ?aorta measuring 42 mm.  ? 7. The inferior vena cava is normal in size with greater than 50%  ?respiratory variability, suggesting right atrial pressure of 3 mmHg.  ? ?Myoview 02/23/20 ? ?The left ventricular ejection fraction is normal (55-65%). ?Nuclear stress EF: 49%. ?There was no ST segment deviation noted during stress. ?The study is normal. ?This is a low risk study. ?  ?Normal pharmacologic nuclear stress test with no evidence for prior infarct or ischemia. LVEF 55%. Frequent PVCs during stress and in recovery. ? ?CT Angio Chest  Aorta 01/2021 ? ?IMPRESSION: ?No evidence of aortic dissection. ?  ?Mild aneurysmal dilatation to 4.2 cm in the ascending aorta. ?Recommend annual imaging followup by CTA or MRA. This recommendation ?follows

## 2022-02-17 NOTE — Patient Instructions (Signed)
Medication Instructions:  ?Continue your current medications.  ? ?*If you need a refill on your cardiac medications before your next appointment, please call your pharmacy* ? ? ?Lab Work: ?None ordered today.  ? ?If you have labs (blood work) drawn today and your tests are completely normal, you will receive your results only by: ?MyChart Message (if you have MyChart) OR ?A paper copy in the mail ?If you have any lab test that is abnormal or we need to change your treatment, we will call you to review the results. ? ? ?Testing/Procedures: ?Your physician has recommended that you wear a Zio monitor.  ? ?This monitor is a medical device that records the heart?s electrical activity. Doctors most often use these monitors to diagnose arrhythmias. Arrhythmias are problems with the speed or rhythm of the heartbeat. The monitor is a small device applied to your chest. You can wear one while you do your normal daily activities. While wearing this monitor if you have any symptoms to push the button and record what you felt. Once you have worn this monitor for the period of time provider prescribed (for 3 days), you will return the monitor device in the postage paid box. Once it is returned they will download the data collected and provide [place] with a report which the provider will then review and we will call you with those results. Important tips: ? ?Avoid showering during the first 24 hours of wearing the monitor. ?Avoid excessive sweating to help maximize wear time. ?Do not submerge the device, no hot tubs, and no swimming pools. ?Keep any lotions or oils away from the patch. ?After 24 hours you may shower with the patch on. Take brief showers with your back facing the shower head.  ?Do not remove patch once it has been placed because that will interrupt data and decrease adhesive wear time. ?Push the button when you have any symptoms and write down what you were feeling. ?Once you have completed wearing your monitor, remove  and place into box which has postage paid and place in your outgoing mailbox.  ?If for some reason you have misplaced your box then call our office and we can provide another box and/or mail it off for you. ? ? ? ? ? ?Follow-Up: ?At [facility], you and your health needs are our priority.  As part of our continuing mission to provide you with exceptional heart care, we have created designated Provider Care Teams.  These Care Teams include your primary Cardiologist (physician) and Advanced Practice Providers (APPs -  Physician Assistants and Nurse Practitioners) who all work together to provide you with the care you need, when you need it. ? ?We recommend signing up for the patient portal called "MyChart".  Sign up information is provided on this After Visit Summary.  MyChart is used to connect with patients for Virtual Visits (Telemedicine).  Patients are able to view lab/test results, encounter notes, upcoming appointments, etc.  Non-urgent messages can be sent to your provider as well.   ?To learn more about what you can do with MyChart, go to [email or web address].   ? ?Your next appointment:   ?3-4 months ? ?The format for your next appointment:   ?In Person ? ?Provider:   ?Laurann Montana, NP{ ? ?Other Instructions ? ?Bring BP cuff to next visit.  ? ?Check BP 3 times per week at home and let us know if consistently more than 130/80.  ? ?Information About Your Aneurysm ? ?One of your tests has  shown an aneurysm of your ascending aorta. The word "aneurysm" refers to a bulge in an artery (blood vessel). Most people think of them in the context of an emergency, but yours was found incidentally. At this point there is nothing you need to do from a procedure standpoint, but there are some important things to keep in mind for day-to-day life. ? ?Mainstays of therapy for aneurysms include very good blood pressure control, healthy lifestyle, and avoiding tobacco products and street drugs. Research has raised concern  that antibiotics in the fluoroquinolone class could be associated with increased risk of having an aneurysm develop or tear. This includes medicines that end in "floxacin," like Cipro or Levaquin. Make sure to discuss this information with other healthcare providers if you require antibiotics. ? ?Since aneurysms can run in families, you should discuss your diagnosis with first degree relatives as they may need to be screened for this. Regular mild-moderate physical exercise is important, but avoid heavy lifting/weight lifting over 30lbs, chopping wood, shoveling snow or digging heavy earth with a shovel. It is best to avoid activities that cause grunting or straining (medically referred to as a "Valsalva maneuver"). This happens when a person bears down against a closed throat to increase the strength of arm or abdominal muscles. There's often a tendency to do this when lifting heavy weights, doing sit-ups, push-ups or chin-ups, etc., but it may be harmful. ? ?This is a finding I would expect to be monitored periodically by your cardiology team. Most unruptured thoracic aortic aneurysms cause no symptoms, so they are often found during exams for other conditions. Contact a health care provider if you develop any discomfort in your upper back, neck, abdomen, trouble swallowing, cough or hoarseness, or unexplained weight loss. Get help right away if you develop severe pain in your upper back or abdomen that may move into your chest and arms, or any other concerning symptoms such as shortness of breath or fever. ? ?  ?

## 2022-02-18 ENCOUNTER — Encounter (HOSPITAL_BASED_OUTPATIENT_CLINIC_OR_DEPARTMENT_OTHER): Payer: Self-pay | Admitting: Family

## 2022-02-19 DIAGNOSIS — Z903 Acquired absence of stomach [part of]: Secondary | ICD-10-CM | POA: Diagnosis not present

## 2022-02-20 ENCOUNTER — Encounter (HOSPITAL_BASED_OUTPATIENT_CLINIC_OR_DEPARTMENT_OTHER): Payer: Self-pay

## 2022-02-26 DIAGNOSIS — I4821 Permanent atrial fibrillation: Secondary | ICD-10-CM | POA: Diagnosis not present

## 2022-02-27 ENCOUNTER — Encounter (HOSPITAL_BASED_OUTPATIENT_CLINIC_OR_DEPARTMENT_OTHER): Payer: Self-pay

## 2022-03-04 ENCOUNTER — Ambulatory Visit: Payer: Medicare Other | Admitting: Skilled Nursing Facility1

## 2022-03-04 ENCOUNTER — Encounter: Payer: Medicare Other | Attending: Surgery | Admitting: Skilled Nursing Facility1

## 2022-03-04 ENCOUNTER — Encounter: Payer: Self-pay | Admitting: Skilled Nursing Facility1

## 2022-03-04 DIAGNOSIS — Z6841 Body Mass Index (BMI) 40.0 and over, adult: Secondary | ICD-10-CM | POA: Diagnosis present

## 2022-03-04 DIAGNOSIS — Z713 Dietary counseling and surveillance: Secondary | ICD-10-CM | POA: Insufficient documentation

## 2022-03-04 NOTE — Progress Notes (Signed)
Bariatric Nutrition Follow-Up Visit ?Medical Nutrition Therapy  ? ? ?NUTRITION ASSESSMENT ?  ?Surgery date: 10/08/2021 ?Surgery type: sleeve ?Start weight at [facility]: 327.9 ?Weight today: 260.8 pounds ? ?Clinical  ?Medical hx: DVT, HTN, depression, sleep apnea ?Medications: see list  ?Labs: A1C 4.7 ?Notable signs/symptoms: pain, limited mobility, diarrhea not often ?Any previous deficiencies? No ?  ?Lifestyle & Dietary Hx ?  ?Pt states she enjoys the Carb zero tortillas from [employer]. ?Pt states she was recently diagnosed with an aneurysm in her heart.  ?Pt states she is retaining fluid in her legs. Pt states she has been to her heart doctor and vein specialist. ?Pt states she loves oatmeal.  ?Pt states she doesn't eat sweets as much anymore, stating she prefers salty foods. ?Pt states she gets hungry.  ?Pt states she can drink more water with a straw. ? ? ? ?Estimated daily fluid intake: 48-50 oz ?Estimated daily protein intake: 40 g ?Supplements: not taking  ?Current average weekly physical activity: starting to do things around the house again  ? ?24-Hr Dietary Recall ?First Meal 9am: lunch meat ?Snack: lunch meat or boiled egg ?Second Meal: 3 ounces stew beef or ground sirloin burger wrapped in lettuce + red onion ?Snack : beans ?Third Meal: chicken + lettuce + salsa + beans + sour cream ?Snack:  ?Beverages: water ? ?Post-Op Goals/ Signs/ Symptoms ?Using straws: no ?Drinking while eating: no ?Chewing/swallowing difficulties: no ?Changes in vision: no ?Changes to mood/headaches: no ?Hair loss/changes to skin/nails: no ?Difficulty focusing/concentrating: no ?Sweating: no ?Limb weakness: no ?Dizziness/lightheadedness: no ?Palpitations: no  ?Carbonated/caffeinated beverages: no ?N/V/D/C/Gas: better ?Abdominal pain: no ?Dumping syndrome: no ? ?  ?NUTRITION DIAGNOSIS  ?Overweight/obesity (Rodriguez Hevia-3.3) related to past poor dietary habits and physical inactivity as evidenced by completed bariatric surgery and following dietary  guidelines for continued weight loss and healthy nutrition status. ?  ?  ?NUTRITION INTERVENTION ?Nutrition counseling (C-1) and education (E-2) to facilitate bariatric surgery goals, including: ?The importance of consuming adequate calories as well as certain nutrients daily due to the body's need for essential vitamins, minerals, and fats ?The importance of intuitive eating specifically learning hunger-satiety cues and understanding the importance of learning a new body: The importance of mindful eating to avoid grazing behaviors: portion sizes ? ? ?Handouts Provided Include  ?Phase 5 ? ?Learning Style & Readiness for Change ?Teaching method utilized: Visual & Auditory  ?Demonstrated degree of understanding via: Teach Back  ?Readiness Level: Action ?Barriers to learning/adherence to lifestyle change: general weakness ? ?RD's Notes for Next Visit ?Assess adherence to pt chosen goals  ? ? ?MONITORING & EVALUATION ?Dietary intake, weekly physical activity, body weight ? ?Next Steps ?Patient is to follow-up in 2 months ?

## 2022-03-06 ENCOUNTER — Telehealth (HOSPITAL_BASED_OUTPATIENT_CLINIC_OR_DEPARTMENT_OTHER): Payer: Self-pay

## 2022-03-06 NOTE — Telephone Encounter (Addendum)
Left message for patient to call back ? ? ? ? ? ?----- Message from Deberah Pelton, NP sent at 03/06/2022  7:55 AM EDT ----- ?Please contact [name] and let her know that her cardiac event monitor results been reviewed.  It showed persistent rate controlled atrial fibrillation.  We will continue with her current medication regimen.  No further testing is needed at this time.  Thank you. ?

## 2022-03-14 DIAGNOSIS — I480 Paroxysmal atrial fibrillation: Secondary | ICD-10-CM | POA: Diagnosis not present

## 2022-03-14 DIAGNOSIS — I10 Essential (primary) hypertension: Secondary | ICD-10-CM | POA: Diagnosis not present

## 2022-03-14 DIAGNOSIS — R609 Edema, unspecified: Secondary | ICD-10-CM | POA: Diagnosis not present

## 2022-03-14 DIAGNOSIS — D6869 Other thrombophilia: Secondary | ICD-10-CM | POA: Diagnosis not present

## 2022-03-17 ENCOUNTER — Telehealth: Payer: Self-pay | Admitting: Internal Medicine

## 2022-03-17 DIAGNOSIS — R0609 Other forms of dyspnea: Secondary | ICD-10-CM

## 2022-03-17 NOTE — Telephone Encounter (Signed)
Reviewed documentation from PCP.   Seen by PCP 12/15/2021 due to weakness.  She is 2 months post lap sleeve gastrectomy with postoperative hematoma.  She is having difficulty adjusting to supplementation due to nausea and not eating much.  BP 117/80.  CMP, vitamin B12, vitamin D, folate acid, magnesium, CBC, ferritin, iron panel were drawn for nutritional assessment.  No cardiac concerns noted at that visit.  PCP visit 03/14/22 with elevated blood pressure 165/101.  She noted more swelling in her ankles and feet as well as cost difficulties with Xarelto despite patient assistance.  She was started on triamterene-HCTZ 37.5-25 mg daily.  Lab work revealed creatinine 0.55, GFR 102, BNP 263, AST 21, ALT 14, hemoglobin 12.9  Elevated BNP overall nonspecific. Could be fluid retention related to elevated blood pressure or atrial fibrillation. Will plan to order echo to reassess heart pumping function.  Plan: Given edema and elevated BNP recommend echocardiogram and follow up afterward.   Loel Dubonnet, NP

## 2022-03-17 NOTE — Telephone Encounter (Signed)
Pt c/o Shortness Of Breath: STAT if SOB developed within the last 24 hours or pt is noticeably SOB on the phone  1. Are you currently SOB (can you hear that pt is SOB on the phone)? No, not when sitting. When up moving  2. How long have you been experiencing SOB? A year   3. Are you SOB when sitting or when up moving around? Moving around  4. Are you currently experiencing any other symptoms? Swelling, but pt's PCP put her on a fluid pill she states and that has helped with swelling.  Pt states that her PCP is suppose to be sending over information because she believes patient is in heart failure due to blood work done on Friday the 19th. It showed her blood sugar was low and blood work is elevated for heart failure per pt's phone call with her PCP today. Pt is requesting call back to try and schedule a sooner appt and advice.

## 2022-03-17 NOTE — Telephone Encounter (Signed)
Left message for patient to call back     Per Gillian Shields, NP  "Please order echo and schedule f/u afterward."

## 2022-03-17 NOTE — Telephone Encounter (Signed)
RN sending fax request to PCP office to request most recent office visit and labs for evaluation by C. Dan Humphreys, NP

## 2022-03-17 NOTE — Telephone Encounter (Signed)
Fax given in clinic 5/22

## 2022-03-18 NOTE — Telephone Encounter (Signed)
Pt is returning call.  

## 2022-03-18 NOTE — Telephone Encounter (Signed)
Rn returned call to patient and got her scheduled for Echo June 1 at [facility], then follow up June 2nd with Gillian Shields, NP at [facility]    Per Gillian Shields, NP  "Please order echo and schedule f/u afterward."

## 2022-03-21 NOTE — Addendum Note (Signed)
Addended by: Gerald Stabs on: 03/21/2022 07:55 AM   Modules accepted: Orders

## 2022-03-26 ENCOUNTER — Telehealth (HOSPITAL_BASED_OUTPATIENT_CLINIC_OR_DEPARTMENT_OTHER): Payer: Self-pay | Admitting: Family

## 2022-03-26 DIAGNOSIS — K219 Gastro-esophageal reflux disease without esophagitis: Secondary | ICD-10-CM | POA: Diagnosis not present

## 2022-03-26 DIAGNOSIS — I4821 Permanent atrial fibrillation: Secondary | ICD-10-CM

## 2022-03-26 DIAGNOSIS — I10 Essential (primary) hypertension: Secondary | ICD-10-CM | POA: Diagnosis not present

## 2022-03-26 DIAGNOSIS — I480 Paroxysmal atrial fibrillation: Secondary | ICD-10-CM | POA: Diagnosis not present

## 2022-03-26 MED ORDER — XARELTO 20 MG PO TABS: ORAL_TABLET | ORAL | 1 refills | Status: DC

## 2022-03-26 NOTE — Telephone Encounter (Signed)
*  STAT* If patient is at the pharmacy, call can be transferred to refill team.   1. Which medications need to be refilled? (please list name of each medication and dose if known) Xarelto  2. Which pharmacy/location (including street and city if local pharmacy) is medication to be sent to?[facility] [address], [place]  3. Do they need a 30 day or 90 day supply? 90 days or whatever she received the last time

## 2022-03-26 NOTE — Telephone Encounter (Signed)
Rx(s) sent to pharmacy electronically.  

## 2022-03-27 ENCOUNTER — Ambulatory Visit (INDEPENDENT_AMBULATORY_CARE_PROVIDER_SITE_OTHER): Payer: Medicare Other

## 2022-03-27 DIAGNOSIS — R0609 Other forms of dyspnea: Secondary | ICD-10-CM | POA: Diagnosis not present

## 2022-03-27 LAB — ECHOCARDIOGRAM COMPLETE
AR max vel: 2.3 cm2
AV Area VTI: 2.24 cm2
AV Area mean vel: 2.22 cm2
AV Mean grad: 4 mmHg
AV Peak grad: 6.5 mmHg
Ao pk vel: 1.27 m/s
Area-P 1/2: 3.58 cm2
S' Lateral: 3.75 cm

## 2022-03-28 ENCOUNTER — Encounter (HOSPITAL_BASED_OUTPATIENT_CLINIC_OR_DEPARTMENT_OTHER): Payer: Self-pay

## 2022-03-28 ENCOUNTER — Encounter (HOSPITAL_BASED_OUTPATIENT_CLINIC_OR_DEPARTMENT_OTHER): Payer: Self-pay | Admitting: Family

## 2022-03-28 ENCOUNTER — Ambulatory Visit (HOSPITAL_BASED_OUTPATIENT_CLINIC_OR_DEPARTMENT_OTHER): Payer: Medicare Other | Admitting: Family

## 2022-03-28 VITALS — BP 110/78 | HR 68 | Ht 62.0 in | Wt 239.8 lb

## 2022-03-28 DIAGNOSIS — I700 Atherosclerosis of aorta: Secondary | ICD-10-CM

## 2022-03-28 DIAGNOSIS — E782 Mixed hyperlipidemia: Secondary | ICD-10-CM | POA: Diagnosis not present

## 2022-03-28 DIAGNOSIS — I493 Ventricular premature depolarization: Secondary | ICD-10-CM

## 2022-03-28 DIAGNOSIS — I4821 Permanent atrial fibrillation: Secondary | ICD-10-CM

## 2022-03-28 DIAGNOSIS — I10 Essential (primary) hypertension: Secondary | ICD-10-CM | POA: Diagnosis not present

## 2022-03-28 DIAGNOSIS — D6859 Other primary thrombophilia: Secondary | ICD-10-CM | POA: Diagnosis not present

## 2022-03-28 NOTE — Patient Instructions (Addendum)
Medication Instructions:  Continue your current medications.   If you decide you want to start the Rosuvastatin (Crestor) please call and let us know.   *If you need a refill on your cardiac medications before your next appointment, please call your pharmacy*  Lab Work: None ordered today. Recommend having your cholesterol checked with Dr. Dema Severin at your next labs.   Testing/Procedures: Your echocardiogram showed your heart pumping function was normal and your heart valves were working well.   Follow-Up: At [facility], you and your health needs are our priority.  As part of our continuing mission to provide you with exceptional heart care, we have created designated Provider Care Teams.  These Care Teams include your primary Cardiologist (physician) and Advanced Practice Providers (APPs -  Physician Assistants and Nurse Practitioners) who all work together to provide you with the care you need, when you need it.  We recommend signing up for the patient portal called "MyChart".  Sign up information is provided on this After Visit Summary.  MyChart is used to connect with patients for Virtual Visits (Telemedicine).  Patients are able to view lab/test results, encounter notes, upcoming appointments, etc.  Non-urgent messages can be sent to your provider as well.   To learn more about what you can do with MyChart, go to [email or web address].    Your next appointment:   6 month(s)  The format for your next appointment:   In Person  Provider:   You may see Thompson Grayer, MD or one of the following Advanced Practice Providers on your designated Care Team:   Tommye Standard, [place] Legrand Como "Jonni Sanger" [place], PA-C{ Loel Dubonnet, NP    Other Instructions  Recommend calling [facility] to see if they can do a 30 day supply instead of 90 days. Their phone number is [phone]).  Rosuvastatin Tablets What is this medication? ROSUVASTATIN (roe SOO va sta tin) treats high cholesterol and  reduces the risk of heart attack and stroke. It works by decreasing bad cholesterol and fats (such as LDL, triglycerides), and increasing good cholesterol (HDL) in your blood. It belongs to a group of medications called statins. Changes to diet and exercise are often combined with this medication. This medicine may be used for other purposes; ask your health care provider or pharmacist if you have questions. COMMON BRAND NAME(S): Crestor What should I tell my care team before I take this medication? They need to know if you have any of these conditions: Diabetes (high blood sugar) If you often drink alcohol Kidney disease Liver disease Muscle cramps, pain Stroke Thyroid disease An unusual or allergic reaction to rosuvastatin, other medications, foods, dyes, or preservatives Pregnant or trying to get pregnant Breast-feeding How should I use this medication? Take this medication by mouth with a glass of water. Follow the directions on the prescription label. You can take it with or without food. If it upsets your stomach, take it with food. Do not cut, crush or chew this medication. Swallow the tablets whole. Take your medication at regular intervals. Do not take it more often than directed. Take antacids that have a combination of aluminum and magnesium hydroxide in them at a different time of day than this medication. Take these products 2 hours AFTER this medication. Talk to your care team about the use of this medication in children. While this medication may be prescribed for children as young as 7 for selected conditions, precautions do apply. Overdosage: If you think you have taken too much of  this medicine contact a poison control center or emergency room at once. NOTE: This medicine is only for you. Do not share this medicine with others. What if I miss a dose? If you miss a dose, take it as soon as you can. If your next dose is to be taken in less than 12 hours, then do not take the  missed dose. Take the next dose at your regular time. Do not take double or extra doses. What may interact with this medication? Do not take this medication with any of the following: Supplements like red yeast rice This medication may also interact with the following: Alcohol Antacids containing aluminum hydroxide and magnesium hydroxide Cyclosporine Other medications for high cholesterol Some medications for HIV infection Warfarin This list may not describe all possible interactions. Give your health care provider a list of all the medicines, herbs, non-prescription drugs, or dietary supplements you use. Also tell them if you smoke, drink alcohol, or use illegal drugs. Some items may interact with your medicine. What should I watch for while using this medication? Visit your health care provider for regular checks on your progress. Tell your health care provider if your symptoms do not start to get better or if they get worse. Your health care provider may tell you to stop taking this medication if you develop muscle problems. If your muscle problems do not go away after stopping this medication, contact your health care provider. Do not become pregnant while taking this medication. Women should inform their health care provider if they wish to become pregnant or think they might be pregnant. There is potential for serious harm to an unborn child. Talk to your health care provider for more information. Do not breast-feed an infant while taking this medication. This medication may increase blood sugar. Ask your health care provider if changes in diet or medications are needed if you have diabetes. If you are going to need surgery or other procedure, tell your health care provider that you are using this medication. Taking this medication is only part of a total heart healthy program. Your health care provider may give you a special diet to follow. Avoid alcohol. Avoid smoking. Ask your health care  provider how much you should exercise. What side effects may I notice from receiving this medication? Side effects that you should report to your care team as soon as possible: Allergic reactions--skin rash, itching, hives, swelling of the face, lips, tongue, or throat High blood sugar (hyperglycemia)--increased thirst or amount of urine, unusual weakness, fatigue, blurry vision Liver injury--right upper belly pain, loss of appetite, nausea, light-colored stool, dark yellow or brown urine, yellowing skin or eyes, unusual weakness, fatigue Muscle injury--unusual weakness, fatigue, muscle pain, dark yellow or brown urine, decrease in amount of urine Redness, blistering, peeling or loosening of the skin, including inside the mouth Side effects that usually do not require medical attention (report to your care team if they continue or are bothersome): Fatigue Headache Nausea Stomach pain This list may not describe all possible side effects. Call your doctor for medical advice about side effects. You may report side effects to FDA at 1-800-FDA-1088. Where should I keep my medication? Keep out of the reach of children and pets. Store between 20 and 25 degrees C (68 and 77 degrees F). Get rid of any unused medication after the expiration date. To get rid of medications that are no longer needed or have expired: Take the medication to a medication take-back program. Check with  your pharmacy or law enforcement to find a location. If you cannot return the medication, check the label or package insert to see if the medication should be thrown out in the garbage or flushed down the toilet. If you are not sure, ask your care team. If it is safe to put it in the trash, take the medication out of the container. Mix the medication with cat litter, dirt, coffee grounds, or other unwanted substance. Seal the mixture in a bag or container. Put it in the trash. NOTE: This sheet is a summary. It may not cover all  possible information. If you have questions about this medicine, talk to your doctor, pharmacist, or health care provider.  2023 [employer]vier/[employer] Standard (2020-11-09 00:00:00)

## 2022-03-28 NOTE — Progress Notes (Signed)
Office Visit    Patient Name: [name] Date of Encounter: 03/28/2022  PCP:  Harlan Stains, MD   [place]  Cardiologist:  Werner Lean, MD  Advanced Practice Provider:  No care team member to display Electrophysiologist:  Thompson Grayer, MD    Chief Complaint    [name] is a 66 y.o. female with a hx of hypertension, aortic atherosclerosis, DVT, permanent atrial fibrillation on chronic anticoagulation, PVC, morbid obesity, osteomyelitis, OSA presents today for follow-up after echocardiogram  Past Medical History    Past Medical History:  Diagnosis Date   Arthritis    Atrial fibrillation ([place])    Chronic lower back pain    Depression    DVT (deep venous thrombosis) ([place]) 1990s   LLE   Dyspnea    Fallen arches    Family history of adverse reaction to anesthesia    Mother had rash and itching after anesthesia   Headache    Hypertension    Hyperthyroidism ~ 2000   "fine now" (04/23/2016)   Joint pain    Lactose intolerance    Leg edema    Obesity    Pneumonia 1980s X 1   Snoring    has not had sleep study but suspects sleep apnea   Past Surgical History:  Procedure Laterality Date   BIOPSY  08/09/2021   Procedure: BIOPSY;  Surgeon: Felicie Morn, MD;  Location: [facility];  Service: General;;   CARDIOVERSION N/A 06/18/2016   Procedure: CARDIOVERSION;  Surgeon: Larey Dresser, MD;  Location: [place];  Service: Cardiovascular;  Laterality: N/A;   CARDIOVERSION N/A 07/11/2016   Procedure: CARDIOVERSION;  Surgeon: Lelon Perla, MD;  Location: [facility] ENDOSCOPY;  Service: Cardiovascular;  Laterality: N/A;   CARDIOVERSION N/A 07/02/2017   Procedure: CARDIOVERSION;  Surgeon: Sanda Klein, MD;  Location: [place] ENDOSCOPY;  Service: Cardiovascular;  Laterality: N/A;   CARDIOVERSION N/A 09/29/2017   Procedure: CARDIOVERSION;  Surgeon: Larey Dresser, MD;  Location: [facility] ENDOSCOPY;  Service: Cardiovascular;   Laterality: N/A;   ENDOMETRIAL ABLATION  ~2006   ESOPHAGOGASTRODUODENOSCOPY N/A 08/09/2021   Procedure: ESOPHAGOGASTRODUODENOSCOPY (EGD);  Surgeon: Felicie Morn, MD;  Location: [facility];  Service: General;  Laterality: N/A;   HIATAL HERNIA REPAIR N/A 10/08/2021   Procedure: HERNIA REPAIR HIATAL;  Surgeon: Clovis Riley, MD;  Location: [facility];  Service: General;  Laterality: N/A;   IR GUIDED DRAIN W CATHETER PLACEMENT  10/18/2021   LAPAROSCOPIC GASTRIC SLEEVE RESECTION N/A 10/08/2021   Procedure: LAPAROSCOPIC GASTRIC SLEEVE RESECTION;  Surgeon: Clovis Riley, MD;  Location: [facility];  Service: General;  Laterality: N/A;   TONSILLECTOMY  1960s   TUBAL LIGATION  1979   UPPER GI ENDOSCOPY N/A 10/08/2021   Procedure: UPPER GI ENDOSCOPY;  Surgeon: Clovis Riley, MD;  Location: [facility];  Service: General;  Laterality: N/A;    Allergies  Allergies  Allergen Reactions   Amlodipine Besylate Swelling    Other reaction(s): edema at 10 mg dose   Codeine Hives   Triamcinolone Nausea Only   Latex Itching and Rash    History of Present Illness    [name] is a 66 y.o. female with a hx of hypertension, DVT, permanent atrial fibrillation on chronic anticoagulation, PVC, morbid obesity, osteomyelitis, OSA, aortic atherosclerosis last seen 02/17/22  Previous history of osteomyelitis of her right foot requiring multiple surgeries in 2019 and immobility   She has followed routinely with Dr. Rayann Heman  in regards to her atrial fibrillation.  Previous work-up for atypical chest pain includes stress test April 2021 with no evidence of ischemia.  Echocardiogram April 2021 LVEF 55-60%, no R WMA, moderate LVH, normal PASP, LA severely dilated, mild to moderate MR.  The patient did request further work-up with cardiac CTA but was discussed with cardiology reader team who did not think with her atrial fibrillation and BMI that diagnostic images would be able to be obtained.   ED visit  02/09/2021 for chest pain with CT angio chest aorta showing mild aneurysmal dilation to 4.2 cm in the ascending aorta recommended for annual imaging, mild dependent atelectatic changes in the lungs bilaterally, aortic atherosclerosis.  Report does not metion coronary atherosclerosis.  Subsequent hospitalization 02/23/2021 for chest pain ongoing for 3 weeks under left breast with radiation to back and left arm.  HS troponin negative x2. Her chest pain resolved was determined to be musculoskeletal from use of her cane with her left arm.  Evaluated July 2022 for preop clearance for bariatric surgery.  Doing well from a cardiac perspective and clearance provided.  She was awaiting to be set up for BiPAP for sleep study showed sleep apnea.   Then seen by Dr. Johney FrameAllred 09/2021 with minimal symptoms in regards to her atrial fibrillation and recommended for EP follow-up in 1 year.  Admitted 10/08/2021 for planned laparoscopic gastric sleeve resection as well as hiatal hernia repair.  Discharged 48 hours later.  HCTZ was discontinued.  Postoperative course unremarkable.  However subsequently readmitted 10/18/11/28/22 for postoperative abdominal wall hematoma.  She was transfused 2 units PRBC.  Due to tachycardia cardiology team increased her Lopressor from 12.5 to 25 mg twice daily.  Xarelto was able to be resumed prior to discharge.  Saw atrial fibrillation clinic 11/13/2021.  She denied chest pain or dyspnea or palpitations.  Felt she was recovering somewhat slowly from her surgery.  Repeat CT chest 02/12/2022 revealed stable 4.2 cm ascending thoracic aortic aneurysm as well as known aortic atherosclerosis.  She was seen 02/17/2022.  Exertional dyspnea was stable at baseline.  No chest discomfort.  She had occasional dizziness described as spinning when laying down or turning certain directions.  Discussed likely etiology of vertigo.  She noted fatigue and 3-day ZIO monitor was placed to determine PVC burden and assess  rate control.   Monitor was atrial fibrillation 100% burden which was rate controlled with average heart rate 75 bpm.  She did have PVC with 10.7% burden.  She subsequently contacted the office 02/2022 after PCP visit with elevated blood pressure and swelling with ankles and feet.  She was started on triamterene-HCTZ.  Due to elevated BNP 263 she was recommended for echocardiogram.  Echocardiogram 03/27/2022 normal LVEF 60 to 65%, no RWMA, moderate LVH, indeterminate diastolic parameters due to atrial fibrillation, RV normal size and function, normal PASP, LA severely dilated, RA mildly dilated, trivial MR, small ascending aortic aneurysm 4.0 cm which was stable compared to previous.  She presents today for follow-up independently.  We reviewed her echocardiogram and ZIO monitor.  Stays very busy raising her grandchildren.  Energy level continues to improve since last seen.  She completed a 1 week course of furosemide at the direction of her PCP due to lower extremity edema and weight now down to 239 pounds.  She is not noticed recurrent edema on her triamterene-HCTZ.  BP at home 120s-150s.  No chest pain, worsening dyspnea, palpitations.  EKGs/Labs/Other Studies Reviewed:   The following studies were  reviewed today:  Monitor 01/2022 Patient had a minimum heart rate of 51 bpm, maximum heart rate of 126 bpm, and average heart rate of 75 bpm. Predominant underlying rhythm was atrial fibrillation (100% of study) Frequent PVCs, some of which are Ashman beats. No triggered and diary events.  Echo 02/2022  1. Left ventricular ejection fraction, by estimation, is 60 to 65%. The  left ventricle has normal function. The left ventricle has no regional  wall motion abnormalities. There is moderate left ventricular hypertrophy.  Left ventricular diastolic  parameters are indeterminate.   2. Right ventricular systolic function is normal. The right ventricular  size is normal. There is normal pulmonary artery  systolic pressure.   3. Left atrial size was severely dilated.   4. Right atrial size was mildly dilated.   5. The mitral valve is degenerative. Trivial mitral valve regurgitation.   6. The aortic valve is normal in structure. Aortic valve regurgitation is  not visualized.   7. Aortic small ascending aortic aneurysm 4.0 cm.   8. The inferior vena cava is normal in size with greater than 50%  respiratory variability, suggesting right atrial pressure of 3 mmHg.   Comparison(s): No significant change from prior study. EF 55%, asc aor  32mm, mod LVH.   Echo 02/23/20  1. Left ventricular ejection fraction, by estimation, is 55 to 60%. The  left ventricle has normal function. The left ventricle has no regional  wall motion abnormalities. There is moderate left ventricular hypertrophy.  Left ventricular diastolic function   could not be evaluated.   2. Right ventricular systolic function is normal. The right ventricular  size is normal. There is normal pulmonary artery systolic pressure.   3. Left atrial size was severely dilated.   4. The mitral valve is abnormal. Mild to moderate mitral valve  regurgitation.   5. The aortic valve is tricuspid. Aortic valve regurgitation is not  visualized.   6. Aortic dilatation noted. There is mild dilatation of the ascending  aorta measuring 42 mm.   7. The inferior vena cava is normal in size with greater than 50%  respiratory variability, suggesting right atrial pressure of 3 mmHg.   Myoview 02/23/20  The left ventricular ejection fraction is normal (55-65%). Nuclear stress EF: 49%. There was no ST segment deviation noted during stress. The study is normal. This is a low risk study.   Normal pharmacologic nuclear stress test with no evidence for prior infarct or ischemia. LVEF 55%. Frequent PVCs during stress and in recovery.  CT Angio Chest Aorta 01/2021  IMPRESSION: No evidence of aortic dissection.   Mild aneurysmal dilatation to 4.2 cm  in the ascending aorta. Recommend annual imaging followup by CTA or MRA. This recommendation follows 2010 ACCF/AHA/AATS/ACR/ASA/SCA/SCAI/SIR/STS/SVM Guidelines for the Diagnosis and Management of Patients with Thoracic Aortic Disease. Circulation. 2010; [id number]. Aortic aneurysm NOS (ICD10-I71.9)   Mild dependent atelectatic changes in the lungs bilaterally.   Aortic Atherosclerosis (ICD10-I70.0).  EKG: No EKG today.   Recent Labs: 10/09/2021: Magnesium 1.8 10/17/2021: ALT 23; TSH 3.083 10/18/2021: BUN 16; Creatinine, Ser 0.87; Potassium 4.2; Sodium 136 10/23/2021: Hemoglobin 8.3; Platelets 481  Recent Lipid Panel    Component Value Date/Time   CHOL 163 02/24/2021 0221   CHOL 157 04/25/2020 1328   TRIG 47 02/24/2021 0221   HDL 72 02/24/2021 0221   HDL 67 04/25/2020 1328   CHOLHDL 2.3 02/24/2021 0221   VLDL 9 02/24/2021 0221   LDLCALC 82 02/24/2021 0221   LDLCALC  74 04/25/2020 1328    Risk Assessment/Calculations:   CHA2DS2-VASc Score = 3  This indicates a 3.2% annual risk of stroke. The patient's score is based upon: CHF History: 0 HTN History: 1 Diabetes History: 0 Stroke History: 0 Vascular Disease History: 1 Age Score: 0 Gender Score: 1  Home Medications   No outpatient medications have been marked as taking for the 03/28/22 encounter (Appointment) with Loel Dubonnet, NP.     Review of Systems   All other systems reviewed and are otherwise negative except as noted above.  Physical Exam    VS:  There were no vitals taken for this visit. , BMI There is no height or weight on file to calculate BMI.  Wt Readings from Last 3 Encounters:  03/04/22 260 lb 12.8 oz (118.3 kg)  02/17/22 260 lb 6.4 oz (118.1 kg)  01/29/22 266 lb (120.7 kg)    GEN: Well nourished, well developed, in no acute distress. HEENT: normal. Neck: Supple, no JVD, carotid bruits, or masses. Cardiac: IRIR, no murmurs, rubs, or gallops. No clubbing, cyanosis, edema.  Radials/PT 2+  and equal bilaterally.  Respiratory:  Respirations regular and unlabored, clear to auscultation bilaterally. GI: Soft, nontender, nondistended. MS: No deformity or atrophy. Skin: Warm and dry, no rash. Neuro:  Strength and sensation are intact. Psych: Normal affect.  Assessment & Plan    Aortic atherosclerosis - Noted by CT angio 01/2021. Recommend addition of statin, detailed below. No aspirin due to chronic anticoagulation.   HTN - BP well controlled today. Discussed to monitor BP at home at least 2 hours after medications and sitting for 5-10 minutes.  BP goal less than 130/80.  Continue present antihypertensive regimen.  LE edema -Echo 02/2022 normal LVEF, indeterminate diastolic dysfunction due to atrial fibrillation.  Volume status improved after 1 week course of Lasix and now on triamterene-HCTZ.  No evidence of heart failure.  Suspect lower extremity edema related to venous insufficiency.  Recommend elevating lower extremity as, low-sodium diet, restrict to less than 2 L of fluid intake per day.  Permanent atrial fibrillation / Chronic anticoagulation / PVC -recent monitor with 100% atrial fibrillation burden and rate control with average heart rate 75 bpm and 10% PVC burden.  Continue Lopressor 25 mg twice daily.  Continue Xarelto 20 mg daily for anticoagulation.  Denies bleeding complications. CHA2DS2-VASc Score = 3 [CHF History: 0, HTN History: 1, Diabetes History: 0, Stroke History: 0, Vascular Disease History: 1, Age Score: 0, Gender Score: 1].  Therefore, the patient's annual risk of stroke is 3.2 %.      HLD - Given aortic atherosclerosis recommend initiation of statin.  She remains hesitant.  04/11/21 LDL 83.  She was given handout on rosuvastatin and will contact her office if she is interested in starting.  Ascending thoracic aortic aneurysm - CT chest 01/2021 and 01/2022 with stable 4.2 cm ascending thoracic aneurysm. Annual imaging recommended. Continue optimal BP control. Lipid  management detailed above.   OSA -compliance with BiPAP encouraged.  Obesity - s/p bariatric surgery with good response. Continued weight loss via diet and exercise encouraged.   Disposition: Follow up December 2020 for with Dr. Rayann Heman or APP.  Signed, Loel Dubonnet, NP 03/28/2022, 8:16 AM [place]

## 2022-04-23 DIAGNOSIS — R29898 Other symptoms and signs involving the musculoskeletal system: Secondary | ICD-10-CM | POA: Diagnosis not present

## 2022-04-23 DIAGNOSIS — Z903 Acquired absence of stomach [part of]: Secondary | ICD-10-CM | POA: Diagnosis not present

## 2022-05-07 ENCOUNTER — Encounter: Payer: Medicare Other | Attending: Family Medicine | Admitting: Skilled Nursing Facility1

## 2022-05-07 DIAGNOSIS — E669 Obesity, unspecified: Secondary | ICD-10-CM

## 2022-05-07 DIAGNOSIS — Z713 Dietary counseling and surveillance: Secondary | ICD-10-CM | POA: Diagnosis not present

## 2022-05-07 NOTE — Progress Notes (Signed)
Bariatric Nutrition Follow-Up Visit Medical Nutrition Therapy    NUTRITION ASSESSMENT   Surgery date: 10/08/2021 Surgery type: sleeve Start weight at [facility]: 327.9 Weight today: 260.8 pounds  Clinical  Medical hx: DVT, HTN, depression, sleep apnea Medications: see list  Labs: A1C 4.7 Notable signs/symptoms: pain, limited mobility, diarrhea not often Any previous deficiencies? No   Lifestyle & Dietary Hx  Pt states she is still having issues with getting in enough protein. Pt states she tried keto bread but it made her sick.  Pt states she has had trouble eating breakfast stating she has  a lot going on with her grandkids stating she is not putting herself first. Pt states she cannot eat greek yogurt.  Pt states she is getting more mobile. Pt states she is going to restart PT soon.  Pt states she cannot tolerate blueberries.  Pt states mercury affect her so she chooses low mercury tuna. Pt state she logs her foods.  Pt states she is on a fluid pill.  Pt states her husband is getting care for his alcoholism which is going well.   Checked blood sugar: 94 in office, recommended she get a meter to check her blood sugars when feeling she has a low.   Estimated daily fluid intake: 48-50 oz Estimated daily protein intake: 40 g Supplements: not taking  Current average weekly physical activity: starting to do things around the house again   24-Hr Dietary Recall: wakes around 6 or 7 First Meal 9am: skipped Snack: black berries + rolled oast and almond flour and brown sugar + butter Second Meal 11am: 3 ounces stew beef or ground sirloin or chicken burger wrapped in lettuce + red onion and high fiber tortilla or sloppy joe + cole slaw Snack: sugar free pudding + sugar free whipped cream Third Meal: keto wrap with cheese Snack: 1 apple + peanut butter Beverages: water, decaf black coffee  Post-Op Goals/ Signs/ Symptoms Using straws: no Drinking while eating: no Chewing/swallowing  difficulties: no Changes in vision: no Changes to mood/headaches: no Hair loss/changes to skin/nails: no Difficulty focusing/concentrating: no Sweating: no Limb weakness: no Dizziness/lightheadedness: no Palpitations: no  Carbonated/caffeinated beverages: no N/V/D/C/Gas: better Abdominal pain: no Dumping syndrome: no    NUTRITION DIAGNOSIS  Overweight/obesity ([place]-3.3) related to past poor dietary habits and physical inactivity as evidenced by completed bariatric surgery and following dietary guidelines for continued weight loss and healthy nutrition status.     NUTRITION INTERVENTION Nutrition counseling (C-1) and education (E-2) to facilitate bariatric surgery goals, including: The importance of consuming adequate calories as well as certain nutrients daily due to the body's need for essential vitamins, minerals, and fats The importance of intuitive eating specifically learning hunger-satiety cues and understanding the importance of learning a new body: The importance of mindful eating to avoid grazing behaviors: portion sizes -Continue to aim for a minimum of 64 fluid ounces 7 days a week with at least  Encouraged patient to honor their body's internal hunger and fullness cues.  Throughout the day, check in mentally and rate hunger. Stop eating when satisfied not full regardless of how much food is left on the plate.  Get more if still hungry 20-30 minutes later.  The key is to honor satisfaction so throughout the meal, rate fullness factor and stop when comfortably satisfied not physically full. The key is to honor hunger and fullness without any feelings of guilt or shame.  Pay attention to what the internal cues are, rather than any external factors. This  will enhance the confidence you have in listening to your own body and following those internal cues enabling you to increase how often you eat when you are hungry not out of appetite and stop when you are satisfied not full.   Encouraged pt to continue to eat balanced meals inclusive of non starchy vegetables 2 times a day 7 days a week Encouraged pt to choose lean protein sources: limiting beef, pork, sausage, hotdogs, and lunch meat Encourage pt to choose healthy fats such as plant based limiting animal fats Encouraged pt to continue to drink a minium 64 fluid ounces with half being plain water to satisfy proper hydration   Goals: Try blended cottage cheese for breakfast Test your blood sugar any time you feel shaky, sweaty, or fatigued   Handouts Provided Include  Phase 7  Learning Style & Readiness for Change Teaching method utilized: Visual & Auditory  Demonstrated degree of understanding via: Teach Back  Readiness Level: Action Barriers to learning/adherence to lifestyle change: general physical weakness  RD's Notes for Next Visit Assess adherence to pt chosen goals    MONITORING & EVALUATION Dietary intake, weekly physical activity, body weight  Next Steps Patient is to follow-up in 2 months

## 2022-05-19 ENCOUNTER — Ambulatory Visit (HOSPITAL_BASED_OUTPATIENT_CLINIC_OR_DEPARTMENT_OTHER): Payer: Medicare Other | Admitting: Family

## 2022-05-28 ENCOUNTER — Other Ambulatory Visit: Payer: Self-pay

## 2022-05-28 MED ORDER — METOPROLOL TARTRATE 25 MG PO TABS: 25.0000 mg | ORAL_TABLET | Freq: Two times a day (BID) | ORAL | 3 refills | Status: AC

## 2022-06-04 ENCOUNTER — Encounter (INDEPENDENT_AMBULATORY_CARE_PROVIDER_SITE_OTHER): Payer: Self-pay

## 2022-06-12 DIAGNOSIS — D6869 Other thrombophilia: Secondary | ICD-10-CM | POA: Diagnosis not present

## 2022-06-12 DIAGNOSIS — I480 Paroxysmal atrial fibrillation: Secondary | ICD-10-CM | POA: Diagnosis not present

## 2022-06-12 DIAGNOSIS — R609 Edema, unspecified: Secondary | ICD-10-CM | POA: Diagnosis not present

## 2022-06-12 DIAGNOSIS — K219 Gastro-esophageal reflux disease without esophagitis: Secondary | ICD-10-CM | POA: Diagnosis not present

## 2022-06-12 DIAGNOSIS — I10 Essential (primary) hypertension: Secondary | ICD-10-CM | POA: Diagnosis not present

## 2022-06-12 DIAGNOSIS — R7303 Prediabetes: Secondary | ICD-10-CM | POA: Diagnosis not present

## 2022-06-26 DIAGNOSIS — Z903 Acquired absence of stomach [part of]: Secondary | ICD-10-CM | POA: Diagnosis not present

## 2022-07-01 DIAGNOSIS — Z1231 Encounter for screening mammogram for malignant neoplasm of breast: Secondary | ICD-10-CM | POA: Diagnosis not present

## 2022-07-04 DIAGNOSIS — R922 Inconclusive mammogram: Secondary | ICD-10-CM | POA: Diagnosis not present

## 2022-07-04 DIAGNOSIS — R928 Other abnormal and inconclusive findings on diagnostic imaging of breast: Secondary | ICD-10-CM | POA: Diagnosis not present

## 2022-07-09 ENCOUNTER — Other Ambulatory Visit: Payer: Self-pay

## 2022-07-09 DIAGNOSIS — N6032 Fibrosclerosis of left breast: Secondary | ICD-10-CM | POA: Diagnosis not present

## 2022-07-09 DIAGNOSIS — R928 Other abnormal and inconclusive findings on diagnostic imaging of breast: Secondary | ICD-10-CM | POA: Diagnosis not present

## 2022-08-05 ENCOUNTER — Encounter: Payer: Medicare Other | Attending: Family Medicine | Admitting: Skilled Nursing Facility1

## 2022-08-05 ENCOUNTER — Encounter: Payer: Self-pay | Admitting: Skilled Nursing Facility1

## 2022-08-05 DIAGNOSIS — E669 Obesity, unspecified: Secondary | ICD-10-CM | POA: Insufficient documentation

## 2022-08-05 DIAGNOSIS — Z6841 Body Mass Index (BMI) 40.0 and over, adult: Secondary | ICD-10-CM | POA: Insufficient documentation

## 2022-08-05 NOTE — Progress Notes (Signed)
Bariatric Nutrition Follow-Up Visit Medical Nutrition Therapy    NUTRITION ASSESSMENT   Surgery date: 10/08/2021 Surgery type: sleeve Start weight at [facility]: 327.9 Weight today: 219 pounds  Clinical  Medical hx: DVT, HTN, depression, sleep apnea Medications: see list  Labs: A1C 4.7 Notable signs/symptoms: pain, limited mobility, diarrhea not often Any previous deficiencies? No   Lifestyle & Dietary Hx   Pt arrives moving well. Pt state she ha shad 2 falls recently.  Pt states she has not been doing PT.  Pt states she has learned she has to prepare with her foods. Pt states she reads all of her nutrition facts lables.  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    Estimated daily fluid intake: 48-50 oz Estimated daily protein intake: 60 g Supplements: multivitamin and 1-2 calcium   Current average weekly physical activity: starting to do things around the house again, trying to park further away  24-Hr Dietary Recall: wakes around 5am First Meal 9am: protein cereal + 2% milk Snack:  Second Meal 11am: 3-4 of ham on wheat with lettuce and cucumber  Snack: apple + peanut butter Third Meal: ribeye + salad + sweet potato + egg + almond flour + pecan + splenda brown sugar Snack: 1 square dark Beverages: water, decaf black coffee  Post-Op Goals/ Signs/ Symptoms Using straws: no Drinking while eating: no Chewing/swallowing difficulties: no Changes in vision: no Changes to mood/headaches: no Hair loss/changes to skin/nails: no Difficulty focusing/concentrating: no Sweating: no Limb weakness: no Dizziness/lightheadedness: no Palpitations: no  Carbonated/caffeinated beverages: no N/V/D/C/Gas: better Abdominal pain: no Dumping syndrome: no    NUTRITION DIAGNOSIS  Overweight/obesity ([place]-3.3) related to past poor dietary habits and physical inactivity as evidenced by completed bariatric surgery and following dietary guidelines for continued weight loss and healthy  nutrition status.     NUTRITION INTERVENTION: continued Nutrition counseling (C-1) and education (E-2) to facilitate bariatric surgery goals, including: The importance of consuming adequate calories as well as certain nutrients daily due to the body's need for essential vitamins, minerals, and fats The importance of intuitive eating specifically learning hunger-satiety cues and understanding the importance of learning a new body: The importance of mindful eating to avoid grazing behaviors: portion sizes -Continue to aim for a minimum of 64 fluid ounces 7 days a week with at least  Encouraged patient to honor their body's internal hunger and fullness cues.  Throughout the day, check in mentally and rate hunger. Stop eating when satisfied not full regardless of how much food is left on the plate.  Get more if still hungry 20-30 minutes later.  The key is to honor satisfaction so throughout the meal, rate fullness factor and stop when comfortably satisfied not physically full. The key is to honor hunger and fullness without any feelings of guilt or shame.  Pay attention to what the internal cues are, rather than any external factors. This will enhance the confidence you have in listening to your own body and following those internal cues enabling you to increase how often you eat when you are hungry not out of appetite and stop when you are satisfied not full.  Encouraged pt to continue to eat balanced meals inclusive of non starchy vegetables 2 times a day 7 days a week Encouraged pt to choose lean protein sources: limiting beef, pork, sausage, hotdogs, and lunch meat Encourage pt to choose healthy fats such as plant based limiting animal fats Encouraged pt to continue to drink a minium 64 fluid ounces  with half being plain water to satisfy proper hydration    Handouts Provided Include  Phase 7  Learning Style & Readiness for Change Teaching method utilized: Visual & Auditory  Demonstrated degree  of understanding via: Teach Back  Readiness Level: Action Barriers to learning/adherence to lifestyle change: general physical weakness  RD's Notes for Next Visit Assess adherence to pt chosen goals    MONITORING & EVALUATION Dietary intake, weekly physical activity, body weight  Next Steps Patient is to follow-up in January

## 2022-09-04 DIAGNOSIS — M85852 Other specified disorders of bone density and structure, left thigh: Secondary | ICD-10-CM | POA: Diagnosis not present

## 2022-09-04 DIAGNOSIS — Z780 Asymptomatic menopausal state: Secondary | ICD-10-CM | POA: Diagnosis not present

## 2022-09-08 DIAGNOSIS — I4819 Other persistent atrial fibrillation: Secondary | ICD-10-CM | POA: Diagnosis not present

## 2022-09-08 DIAGNOSIS — R7303 Prediabetes: Secondary | ICD-10-CM | POA: Diagnosis not present

## 2022-09-08 DIAGNOSIS — I700 Atherosclerosis of aorta: Secondary | ICD-10-CM | POA: Diagnosis not present

## 2022-09-08 DIAGNOSIS — I10 Essential (primary) hypertension: Secondary | ICD-10-CM | POA: Diagnosis not present

## 2022-09-08 DIAGNOSIS — D509 Iron deficiency anemia, unspecified: Secondary | ICD-10-CM | POA: Diagnosis not present

## 2022-09-08 DIAGNOSIS — Z9884 Bariatric surgery status: Secondary | ICD-10-CM | POA: Diagnosis not present

## 2022-09-08 DIAGNOSIS — Z23 Encounter for immunization: Secondary | ICD-10-CM | POA: Diagnosis not present

## 2022-09-08 DIAGNOSIS — Z0000 Encounter for general adult medical examination without abnormal findings: Secondary | ICD-10-CM | POA: Diagnosis not present

## 2022-09-08 DIAGNOSIS — M8588 Other specified disorders of bone density and structure, other site: Secondary | ICD-10-CM | POA: Diagnosis not present

## 2022-09-08 DIAGNOSIS — K219 Gastro-esophageal reflux disease without esophagitis: Secondary | ICD-10-CM | POA: Diagnosis not present

## 2022-09-08 DIAGNOSIS — D6869 Other thrombophilia: Secondary | ICD-10-CM | POA: Diagnosis not present

## 2022-09-26 ENCOUNTER — Ambulatory Visit (HOSPITAL_BASED_OUTPATIENT_CLINIC_OR_DEPARTMENT_OTHER): Payer: Medicare Other | Admitting: Family

## 2022-09-26 ENCOUNTER — Encounter (HOSPITAL_BASED_OUTPATIENT_CLINIC_OR_DEPARTMENT_OTHER): Payer: Self-pay | Admitting: Family

## 2022-09-26 VITALS — BP 124/82 | HR 64 | Ht 62.0 in | Wt 217.0 lb

## 2022-09-26 DIAGNOSIS — G4733 Obstructive sleep apnea (adult) (pediatric): Secondary | ICD-10-CM | POA: Diagnosis not present

## 2022-09-26 DIAGNOSIS — D6859 Other primary thrombophilia: Secondary | ICD-10-CM | POA: Diagnosis not present

## 2022-09-26 DIAGNOSIS — I700 Atherosclerosis of aorta: Secondary | ICD-10-CM

## 2022-09-26 DIAGNOSIS — I4821 Permanent atrial fibrillation: Secondary | ICD-10-CM

## 2022-09-26 DIAGNOSIS — I7121 Aneurysm of the ascending aorta, without rupture: Secondary | ICD-10-CM

## 2022-09-26 DIAGNOSIS — I872 Venous insufficiency (chronic) (peripheral): Secondary | ICD-10-CM | POA: Diagnosis not present

## 2022-09-26 DIAGNOSIS — E782 Mixed hyperlipidemia: Secondary | ICD-10-CM

## 2022-09-26 MED ORDER — XARELTO 20 MG PO TABS: ORAL_TABLET | ORAL | 3 refills | Status: DC

## 2022-09-26 NOTE — Patient Instructions (Signed)
Medication Instructions:  Your Physician recommend you continue on your current medication as directed.    We have given you samples of Xarelto today and sent in refills.  *If you need a refill on your cardiac medications before your next appointment, please call your pharmacy*  Follow-Up: At [facility], you and your health needs are our priority.  As part of our continuing mission to provide you with exceptional heart care, we have created designated Provider Care Teams.  These Care Teams include your primary Cardiologist (physician) and Advanced Practice Providers (APPs -  Physician Assistants and Nurse Practitioners) who all work together to provide you with the care you need, when you need it.  We recommend signing up for the patient portal called "MyChart".  Sign up information is provided on this After Visit Summary.  MyChart is used to connect with patients for Virtual Visits (Telemedicine).  Patients are able to view lab/test results, encounter notes, upcoming appointments, etc.  Non-urgent messages can be sent to your provider as well.   To learn more about what you can do with MyChart, go to [email or web address].    Your next appointment:   1 year(s)  The format for your next appointment:   In Person  Provider:   Gillian Shields, NP    Other Instructions Heart Healthy Diet Recommendations: A low-salt diet is recommended. Meats should be grilled, baked, or boiled. Avoid fried foods. Focus on lean protein sources like fish or chicken with vegetables and fruits. The American Heart Association is a Chief Technology Officer!  American Heart Association Diet and Lifeystyle Recommendations   Exercise recommendations: The American Heart Association recommends 150 minutes of moderate intensity exercise weekly. Try 30 minutes of moderate intensity exercise 4-5 times per week. This could include walking, jogging, or swimming.   Important Information About Sugar

## 2022-09-26 NOTE — Progress Notes (Signed)
Office Visit    Patient Name: [name] Date of Encounter: 09/26/2022  PCP:  Harlan Stains, MD   [place]  Cardiologist:  Werner Lean, MD  Advanced Practice Provider:  No care team member to display Electrophysiologist:  Thompson Grayer, MD    Chief Complaint    [name] is a 66 y.o. female with a hx of hypertension, aortic atherosclerosis, DVT, permanent atrial fibrillation on chronic anticoagulation, PVC, morbid obesity, osteomyelitis, OSA presents today for hypertension follow-up Past Medical History    Past Medical History:  Diagnosis Date   Arthritis    Atrial fibrillation ([place])    Chronic lower back pain    Depression    DVT (deep venous thrombosis) (HCC) 1990s   LLE   Dyspnea    Fallen arches    Family history of adverse reaction to anesthesia    Mother had rash and itching after anesthesia   Headache    Hypertension    Hyperthyroidism ~ 2000   "fine now" (04/23/2016)   Joint pain    Lactose intolerance    Leg edema    Obesity    Pneumonia 1980s X 1   Snoring    has not had sleep study but suspects sleep apnea   Past Surgical History:  Procedure Laterality Date   BIOPSY  08/09/2021   Procedure: BIOPSY;  Surgeon: Felicie Morn, MD;  Location: [facility];  Service: General;;   CARDIOVERSION N/A 06/18/2016   Procedure: CARDIOVERSION;  Surgeon: Larey Dresser, MD;  Location: [facility];  Service: Cardiovascular;  Laterality: N/A;   CARDIOVERSION N/A 07/11/2016   Procedure: CARDIOVERSION;  Surgeon: Lelon Perla, MD;  Location[facility];  Service: Cardiovascular;  Laterality: N/A;   CARDIOVERSION N/A 07/02/2017   Procedure: CARDIOVERSION;  Surgeon: Sanda Klein, MD;  Location: [facility];  Service: Cardiovascular;  Laterality: N/A;   CARDIOVERSION N/A 09/29/2017   Procedure: CARDIOVERSION;  Surgeon: Larey Dresser, MD;  Location: [facility] ENDOSCOPY;  Service: Cardiovascular;  Laterality: N/A;    ENDOMETRIAL ABLATION  ~2006   ESOPHAGOGASTRODUODENOSCOPY N/A 08/09/2021   Procedure: ESOPHAGOGASTRODUODENOSCOPY (EGD);  Surgeon: Felicie Morn, MD;  Location: [facility];  Service: General;  Laterality: N/A;   HIATAL HERNIA REPAIR N/A 10/08/2021   Procedure: HERNIA REPAIR HIATAL;  Surgeon: Clovis Riley, MD;  Location: [facility];  Service: General;  Laterality: N/A;   IR GUIDED DRAIN W CATHETER PLACEMENT  10/18/2021   LAPAROSCOPIC GASTRIC SLEEVE RESECTION N/A 10/08/2021   Procedure: LAPAROSCOPIC GASTRIC SLEEVE RESECTION;  Surgeon: Clovis Riley, MD;  Location: [facility];  Service: General;  Laterality: N/A;   TONSILLECTOMY  1960s   TUBAL LIGATION  1979   UPPER GI ENDOSCOPY N/A 10/08/2021   Procedure: UPPER GI ENDOSCOPY;  Surgeon: Clovis Riley, MD;  Location: [facility];  Service: General;  Laterality: N/A;    Allergies  Allergies  Allergen Reactions   Amlodipine Besylate Swelling    Other reaction(s): edema at 10 mg dose   Codeine Hives   Triamcinolone Nausea Only   Latex Itching and Rash    History of Present Illness    [name] is a 66 y.o. female with a hx of hypertension, DVT, permanent atrial fibrillation on chronic anticoagulation, PVC, morbid obesity, osteomyelitis, OSA, aortic atherosclerosis last seen 03/28/2022  Previous history of osteomyelitis of her right foot requiring multiple surgeries in 2019 and immobility   She has followed routinely with Dr. Rayann Heman in regards  to her atrial fibrillation.  Previous work-up for atypical chest pain includes stress test April 2021 with no evidence of ischemia.  Echocardiogram April 2021 LVEF 55-60%, no R WMA, moderate LVH, normal PASP, LA severely dilated, mild to moderate MR.  The patient did request further work-up with cardiac CTA but was discussed with cardiology reader team who did not think with her atrial fibrillation and BMI that diagnostic images would be able to be obtained.   ED visit 02/09/2021 for  chest pain with CT angio chest aorta showing mild aneurysmal dilation to 4.2 cm in the ascending aorta recommended for annual imaging, mild dependent atelectatic changes in the lungs bilaterally, aortic atherosclerosis.  Report does not metion coronary atherosclerosis.  Subsequent hospitalization 02/23/2021 for chest pain ongoing for 3 weeks under left breast with radiation to back and left arm.  HS troponin negative x2. Her chest pain resolved was determined to be musculoskeletal from use of her cane with her left arm.  Evaluated July 2022 for preop clearance for bariatric surgery.  Doing well from a cardiac perspective and clearance provided.  She was awaiting to be set up for BiPAP for sleep study showed sleep apnea.   Then seen by Dr. Johney Frame 09/2021 with minimal symptoms in regards to her atrial fibrillation and recommended for EP follow-up in 1 year.  Admitted 10/08/2021 for planned laparoscopic gastric sleeve resection as well as hiatal hernia repair.  Discharged 48 hours later.  HCTZ was discontinued.  Postoperative course unremarkable.  However subsequently readmitted 10/18/11/28/22 for postoperative abdominal wall hematoma.  She was transfused 2 units PRBC.  Due to tachycardia cardiology team increased her Lopressor from 12.5 to 25 mg twice daily.  Xarelto was able to be resumed prior to discharge.  Saw atrial fibrillation clinic 11/13/2021.  She denied chest pain or dyspnea or palpitations.  Felt she was recovering somewhat slowly from her surgery.  Repeat CT chest 02/12/2022 revealed stable 4.2 cm ascending thoracic aortic aneurysm as well as known aortic atherosclerosis.  She was seen 02/17/2022.  3-day ZIO revealed atrial fibrillation 100% burden which was rate controlled with average heart rate 75 bpm.  She did have PVC with 10.7% burden.  She subsequently contacted the office 02/2022 after PCP visit with elevated blood pressure and swelling with ankles and feet.  She was started on  triamterene-HCTZ.  Due to elevated BNP 263 she was recommended for echocardiogram.  Echocardiogram 03/27/2022 normal LVEF 60 to 65%, no RWMA, moderate LVH, indeterminate diastolic parameters due to atrial fibrillation, RV normal size and function, normal PASP, LA severely dilated, RA mildly dilated, trivial MR, small ascending aortic aneurysm 4.0 cm which was stable compared to previous.  Last seen 03/28/2022 doing well from a cardiac perspective and no changes were made.  Presents today for follow up independently. She is in school taking writing classes and in her final week. She has lost 20 pounds since last visit after gastric bypass. Reports no shortness of breath at rest and stable mild dyspnea on exertion. Reports no chest pain, pressure, or tightness. No edema, orthopnea, PND. Reports no palpitations.     EKGs/Labs/Other Studies Reviewed:   The following studies were reviewed today:  Monitor 01/2022 Patient had a minimum heart rate of 51 bpm, maximum heart rate of 126 bpm, and average heart rate of 75 bpm. Predominant underlying rhythm was atrial fibrillation (100% of study) Frequent PVCs, some of which are Ashman beats. No triggered and diary events.  Echo 02/2022  1. Left ventricular ejection  fraction, by estimation, is 60 to 65%. The  left ventricle has normal function. The left ventricle has no regional  wall motion abnormalities. There is moderate left ventricular hypertrophy.  Left ventricular diastolic  parameters are indeterminate.   2. Right ventricular systolic function is normal. The right ventricular  size is normal. There is normal pulmonary artery systolic pressure.   3. Left atrial size was severely dilated.   4. Right atrial size was mildly dilated.   5. The mitral valve is degenerative. Trivial mitral valve regurgitation.   6. The aortic valve is normal in structure. Aortic valve regurgitation is  not visualized.   7. Aortic small ascending aortic aneurysm 4.0 cm.    8. The inferior vena cava is normal in size with greater than 50%  respiratory variability, suggesting right atrial pressure of 3 mmHg.   Comparison(s): No significant change from prior study. EF 55%, asc aor  70mm, mod LVH.   Echo 02/23/20  1. Left ventricular ejection fraction, by estimation, is 55 to 60%. The  left ventricle has normal function. The left ventricle has no regional  wall motion abnormalities. There is moderate left ventricular hypertrophy.  Left ventricular diastolic function   could not be evaluated.   2. Right ventricular systolic function is normal. The right ventricular  size is normal. There is normal pulmonary artery systolic pressure.   3. Left atrial size was severely dilated.   4. The mitral valve is abnormal. Mild to moderate mitral valve  regurgitation.   5. The aortic valve is tricuspid. Aortic valve regurgitation is not  visualized.   6. Aortic dilatation noted. There is mild dilatation of the ascending  aorta measuring 42 mm.   7. The inferior vena cava is normal in size with greater than 50%  respiratory variability, suggesting right atrial pressure of 3 mmHg.   Myoview 02/23/20  The left ventricular ejection fraction is normal (55-65%). Nuclear stress EF: 49%. There was no ST segment deviation noted during stress. The study is normal. This is a low risk study.   Normal pharmacologic nuclear stress test with no evidence for prior infarct or ischemia. LVEF 55%. Frequent PVCs during stress and in recovery.  CT Angio Chest Aorta 01/2021  IMPRESSION: No evidence of aortic dissection.   Mild aneurysmal dilatation to 4.2 cm in the ascending aorta. Recommend annual imaging followup by CTA or MRA. This recommendation follows 2010 ACCF/AHA/AATS/ACR/ASA/SCA/SCAI/SIR/STS/SVM Guidelines for the Diagnosis and Management of Patients with Thoracic Aortic Disease. Circulation. 2010; [id number]. Aortic aneurysm NOS (ICD10-I71.9)   Mild dependent  atelectatic changes in the lungs bilaterally.   Aortic Atherosclerosis (ICD10-I70.0).  EKG: No EKG today.   Recent Labs: 10/09/2021: Magnesium 1.8 10/17/2021: ALT 23; TSH 3.083 10/18/2021: BUN 16; Creatinine, Ser 0.87; Potassium 4.2; Sodium 136 10/23/2021: Hemoglobin 8.3; Platelets 481  Recent Lipid Panel    Component Value Date/Time   CHOL 163 02/24/2021 0221   CHOL 157 04/25/2020 1328   TRIG 47 02/24/2021 0221   HDL 72 02/24/2021 0221   HDL 67 04/25/2020 1328   CHOLHDL 2.3 02/24/2021 0221   VLDL 9 02/24/2021 0221   LDLCALC 82 02/24/2021 0221   LDLCALC 74 04/25/2020 1328    Risk Assessment/Calculations:   CHA2DS2-VASc Score =    This indicates a  % annual risk of stroke. The patient's score is based upon:    Home Medications   Current Meds  Medication Sig   acetaminophen (TYLENOL) 500 MG tablet Take 2 tablets (1,000 mg total) by  mouth every 6 (six) hours as needed for mild pain, fever or headache.   albuterol (VENTOLIN HFA) 108 (90 Base) MCG/ACT inhaler Inhale 1-2 puffs into the lungs every 6 (six) hours as needed for shortness of breath or wheezing.   amphetamine-dextroamphetamine (ADDERALL XR) 15 MG 24 hr capsule Take 15 mg by mouth daily.   Ascorbic Acid (VITAMIN C GUMMIE PO) Take 2 tablets by mouth daily.   calcium carbonate (TUMS - DOSED IN MG ELEMENTAL CALCIUM) 500 MG chewable tablet Chew 1 tablet by mouth daily.   DULoxetine (CYMBALTA) 60 MG capsule Take 60 mg by mouth daily.   gabapentin (NEURONTIN) 400 MG capsule Take 400 mg by mouth 3 (three) times daily.   metoprolol tartrate (LOPRESSOR) 25 MG tablet Take 1 tablet (25 mg total) by mouth 2 (two) times daily.   ondansetron (ZOFRAN-ODT) 4 MG disintegrating tablet Dissolve 1 tablet (4 mg total) by mouth every 6 (six) hours as needed for nausea or vomiting.   pantoprazole (PROTONIX) 40 MG tablet Take 1 tablet (40 mg total) by mouth daily.   triamterene-hydrochlorothiazide (MAXZIDE-25) 37.5-25 MG tablet Take 1  tablet by mouth every morning.   XARELTO 20 MG TABS tablet TAKE 1 TABLET(20 MG) BY MOUTH DAILY WITH SUPPER   [DISCONTINUED] amphetamine-dextroamphetamine (ADDERALL XR) 15 MG 24 hr capsule Take 15 mg by mouth 2 (two) times daily.     Review of Systems   All other systems reviewed and are otherwise negative except as noted above.  Physical Exam    VS:  BP 124/82   Pulse 64   Ht 5\' 2"  (1.575 m)   Wt 217 lb (98.4 kg)   BMI 39.69 kg/m  , BMI Body mass index is 39.69 kg/m.  Wt Readings from Last 3 Encounters:  09/26/22 217 lb (98.4 kg)  08/05/22 219 lb (99.3 kg)  05/07/22 226 lb 6.4 oz (102.7 kg)    GEN: Well nourished, well developed, in no acute distress. HEENT: normal. Neck: Supple, no JVD, carotid bruits, or masses. Cardiac: IRIR, no murmurs, rubs, or gallops. No clubbing, cyanosis, edema.  Radials/PT 2+ and equal bilaterally.  Respiratory:  Respirations regular and unlabored, clear to auscultation bilaterally. GI: Soft, nontender, nondistended. MS: No deformity or atrophy. Skin: Warm and dry, no rash. Neuro:  Strength and sensation are intact. Psych: Normal affect.  Assessment & Plan    Aortic atherosclerosis - Noted by CT angio 01/2021. Previously declined statin. Stable with no anginal symptoms. No indication for ischemic evaluation.   No aspirin due to chronic anticoagulation.   HTN - BP well controlled today. Discussed to monitor BP at home at least 2 hours after medications and sitting for 5-10 minutes.  BP goal less than 130/80.  Continue present antihypertensive regimen.  LE edema -Echo 02/2022 normal LVEF, indeterminate diastolic dysfunction due to atrial fibrillation. Well controlled on triamterene-HCTZ.  No evidence of heart failure.  Suspect lower extremity edema related to venous insufficiency.  Recommend elevating lower extremity as, low-sodium diet, restrict to less than 2 L of fluid intake per day.  Permanent atrial fibrillation / Chronic anticoagulation / PVC  -r  Continue Lopressor 25 mg twice daily.  Continue Xarelto 20 mg daily for anticoagulation.  Denies bleeding complications.   HLD - 08/2022 LDL 89. Previously declined statin. Heart healthy diet and regular cardiovascular exercise encouraged.   Ascending thoracic aortic aneurysm - CT chest 01/2021 and 01/2022 with stable 4.2 cm ascending thoracic aneurysm. Annual imaging recommended - followed by PCP.  Continue optimal  BP control. Lipid management detailed above.   OSA -compliance with BiPAP encouraged.  Obesity - s/p bariatric surgery with good response. Continued weight loss via diet and exercise encouraged.   Disposition: Follow up in 1 year with Loel Dubonnet, NP   Signed, Loel Dubonnet, NP 09/26/2022, 10:04 AM [place]

## 2022-10-10 DIAGNOSIS — Z903 Acquired absence of stomach [part of]: Secondary | ICD-10-CM | POA: Diagnosis not present

## 2022-10-13 ENCOUNTER — Ambulatory Visit (HOSPITAL_BASED_OUTPATIENT_CLINIC_OR_DEPARTMENT_OTHER): Payer: Medicare Other | Attending: Family Medicine | Admitting: Physical Therapy

## 2022-10-13 ENCOUNTER — Other Ambulatory Visit: Payer: Self-pay

## 2022-10-13 ENCOUNTER — Encounter (HOSPITAL_BASED_OUTPATIENT_CLINIC_OR_DEPARTMENT_OTHER): Payer: Self-pay | Admitting: Physical Therapy

## 2022-10-13 DIAGNOSIS — R296 Repeated falls: Secondary | ICD-10-CM | POA: Diagnosis not present

## 2022-10-13 DIAGNOSIS — R262 Difficulty in walking, not elsewhere classified: Secondary | ICD-10-CM

## 2022-10-13 DIAGNOSIS — M6281 Muscle weakness (generalized): Secondary | ICD-10-CM

## 2022-10-13 NOTE — Therapy (Signed)
OUTPATIENT PHYSICAL THERAPY LOWER EXTREMITY EVALUATION   Patient Name: [name] MRN: [id number] DOB:1956/03/23, 66 y.o., female Today's Date: 10/14/2022  END OF SESSION:  PT End of Session - 10/13/22 1320     Visit Number 1    Number of Visits 12    Date for PT Re-Evaluation 11/24/22    PT Start Time 1300    PT Stop Time 1345    PT Time Calculation (min) 45 min    Activity Tolerance Patient tolerated treatment well    Behavior During Therapy WFL for tasks assessed/performed             Past Medical History:  Diagnosis Date   Arthritis    Atrial fibrillation (HCC)    Chronic lower back pain    Depression    DVT (deep venous thrombosis) (HCC) 1990s   LLE   Dyspnea    Fallen arches    Family history of adverse reaction to anesthesia    Mother had rash and itching after anesthesia   Headache    Hypertension    Hyperthyroidism ~ 2000   "fine now" (04/23/2016)   Joint pain    Lactose intolerance    Leg edema    Obesity    Pneumonia 1980s X 1   Snoring    has not had sleep study but suspects sleep apnea   Past Surgical History:  Procedure Laterality Date   BIOPSY  08/09/2021   Procedure: BIOPSY;  Surgeon: Felicie Morn, MD;  Location: [facility];  Service: General;;   CARDIOVERSION N/A 06/18/2016   Procedure: CARDIOVERSION;  Surgeon: Larey Dresser, MD;  Location: [place];  Service: Cardiovascular;  Laterality: N/A;   CARDIOVERSION N/A 07/11/2016   Procedure: CARDIOVERSION;  Surgeon: Lelon Perla, MD;  Location: [facility];  Service: Cardiovascular;  Laterality: N/A;   CARDIOVERSION N/A 07/02/2017   Procedure: CARDIOVERSION;  Surgeon: Sanda Klein, MD;  Location: [facility];  Service: Cardiovascular;  Laterality: N/A;   CARDIOVERSION N/A 09/29/2017   Procedure: CARDIOVERSION;  Surgeon: Larey Dresser, MD;  Location: [facility] ENDOSCOPY;  Service: Cardiovascular;  Laterality: N/A;   ENDOMETRIAL ABLATION  ~2006   ESOPHAGOGASTRODUODENOSCOPY  N/A 08/09/2021   Procedure: ESOPHAGOGASTRODUODENOSCOPY (EGD);  Surgeon: Felicie Morn, MD;  Location: [facility];  Service: General;  Laterality: N/A;   HIATAL HERNIA REPAIR N/A 10/08/2021   Procedure: HERNIA REPAIR HIATAL;  Surgeon: Clovis Riley, MD;  Location: [facility];  Service: General;  Laterality: N/A;   IR GUIDED DRAIN W CATHETER PLACEMENT  10/18/2021   LAPAROSCOPIC GASTRIC SLEEVE RESECTION N/A 10/08/2021   Procedure: LAPAROSCOPIC GASTRIC SLEEVE RESECTION;  Surgeon: Clovis Riley, MD;  Location: [facility];  Service: General;  Laterality: N/A;   TONSILLECTOMY  1960s   TUBAL LIGATION  1979   UPPER GI ENDOSCOPY N/A 10/08/2021   Procedure: UPPER GI ENDOSCOPY;  Surgeon: Clovis Riley, MD;  Location: [facility];  Service: General;  Laterality: N/A;   Patient Active Problem List   Diagnosis Date Noted   Postoperative hematoma of subcutaneous tissue following non-dermatologic procedure 10/17/2021   Insulin resistance 05/11/2020   DOE (dyspnea on exertion) 02/09/2020   Depression 09/26/2019   Vitamin D deficiency 09/01/2019   Class 3 severe obesity with serious comorbidity and body mass index (BMI) of 50.0 to 59.9 in adult [facility]) 09/01/2019   Prediabetes 08/15/2019   Atrial fibrillation ([place])    Chest pain 04/22/2016   Hypokalemia 04/22/2016   Morbid obesity ([place]) 04/22/2016  Hypertension 04/22/2016   Paroxysmal A-fib (HCC) 04/22/2016   Hypomagnesemia 04/22/2016    PCP: Laurann Montana MD   REFERRING PROVIDER: Laurann Montana   REFERRING DIAG:R29.6 (ICD-10-CM) - Repeated falls   THERAPY DIAG:  Difficulty in walking, not elsewhere classified  Muscle weakness (generalized)  Repeated falls  Rationale for Evaluation and Treatment: Rehabilitation  ONSET DATE: balance has become worse over the past few months  SUBJECTIVE:   SUBJECTIVE STATEMENT:  PERTINENT HISTORY: Arthritis, A-fib, DVT. Depression, Dyspnea, headaches, le edema,  Obesity; gastric bypass,    PAIN:   A little in her wrist but nothing consistent   PRECAUTIONS: Fall  WEIGHT BEARING RESTRICTIONS: No  FALLS:  Has patient fallen in last 6 months? Yes. Number of falls 3  Last fall: fell backwards int he shower  LIVING ENVIRONMENT: Ramp into the house  OCCUPATION:  On disability   Hobbies:  Traveling   PLOF: Independent and Independent with community mobility with device  PATIENT GOALS:  To have less falls   NEXT MD VISIT: every 2-3 months   OBJECTIVE:   DIAGNOSTIC FINDINGS:  Nothing recent   PATIENT SURVEYS:  FOTO    COGNITION: Overall cognitive status: Within functional limits for tasks assessed     SENSATION: WFL  EDEMA:  Past history of edema but no significant edema at this time   MUSCLE LENGTH:  POSTURE: No Significant postural limitations  PALPATION: No significant tenderness to palpation   LOWER EXTREMITY ROM:  Passive ROM Right eval Left eval  Hip flexion    Hip extension    Hip abduction    Hip adduction    Hip internal rotation    Hip external rotation    Knee flexion    Knee extension    Ankle dorsiflexion    Ankle plantarflexion    Ankle inversion    Ankle eversion     (Blank rows = not tested)  LOWER EXTREMITY MMT:  MMT Right eval Left eval  Hip flexion 27.4 27.1  Hip extension    Hip abduction 26.5 30.4  Hip adduction    Hip internal rotation    Hip external rotation    Knee flexion    Knee extension 23.0 25.2  Ankle dorsiflexion    Ankle plantarflexion    Ankle inversion    Ankle eversion     (Blank rows = not tested)  LOWER EXTREMITY SPECIAL TESTS:   FUNCTIONAL TESTS:  Berg Balance Scale:   BERG Balance Test          Date:   Sit to Stand 3  Standing unsupported 3  Sitting with back unsupported but feet supported 4  Stand to sit  3  Transfers  4  Standing unsupported with eyes closed 4  Standing unsupported feet together 4  From standing position, reach forward with outstretched arm 3  From  standing position, pick up object from floor 4  From standing position, turn and look behind over each shoulder 4  Turn 360 2  Standing unsupported, alternately place foot on step 3  Standing unsupported, one foot in front 0  Standing on one leg 0  Total:  41    GAIT: The patient comes in today using her cane on the left side. The left side is where her foot issue is. She is now hoaving some discomort in her left shoulder. We reviewed proper use of the cane.   TODAY'S TREATMENT:  DATE:  No time for HEP 2nd to BERG testing     PATIENT EDUCATION:  Education details: HEP, symptom manageent; safety in the home  Person educated: Patient Education method: Explanation, Demonstration, Tactile cues, and Verbal cues Education comprehension: verbalized understanding, returned demonstration, verbal cues required, tactile cues required, and needs further education  HOME EXERCISE PROGRAM: No time on initial eval 2nd to BERG testing   ASSESSMENT:  CLINICAL IMPRESSION: Patient is a 66 year old female who comes into physical therapy following 3 falls over the course of the past 6 months.  She reports most of the falls happen when she is reaching last fall she fell backwards in the shower.  Her Berg balance score puts her at a mild fall risk.  She has baseline issues with her left lower extremity following the surgery.  OBJECTIVE IMPAIRMENTS: Abnormal gait, decreased activity tolerance, decreased balance, decreased endurance, decreased mobility, difficulty walking, decreased ROM, and decreased strength.   ACTIVITY LIMITATIONS: lifting, bending, standing, squatting, stairs, and locomotion level  PARTICIPATION LIMITATIONS: meal prep, cleaning, laundry, driving, shopping, occupation, and yard work  PERSONAL FACTORS: 1-2 comorbidities: left foot surgery; low back pain   are  also affecting patient's functional outcome.   REHAB POTENTIAL: Good  CLINICAL DECISION MAKING: Evolving/moderate complexity  EVALUATION COMPLEXITY: Low   GOALS: Goals reviewed with patient? Yes  SHORT TERM GOALS: Target date: 11/03/2022   Patient will be independent with basic balance and strength testing  Baseline: Goal status: INITIAL  2.  Patient will increase BERG score by 4 points  Baseline:  Goal status: INITIAL  3.  Patient will increase gross bilateral LE strength by 5 lbs  Baseline:  Goal status: INITIAL   LONG TERM GOALS: Target date: 11/24/2022    Patient will have no falls over a 2 weeks period proiro to discharge  Baseline:  Goal status: INITIAL  2.  Patient will ambulate 3000' without loss of balance or pain in her left shoulder  Baseline:  Goal status: INITIAL  3.  Patient will have complete balance and strengthening plan  Baseline:  Goal status: INITIAL   PLAN:  PT FREQUENCY: 1-2x/week  PT DURATION: 6 weeks  PLANNED INTERVENTIONS: Therapeutic exercises, Therapeutic activity, Neuromuscular re-education, Balance training, Gait training, Patient/Family education, Self Care, Joint mobilization, DME instructions, Aquatic Therapy, Dry Needling, Electrical stimulation, Cryotherapy, Moist heat, and Manual therapy  PLAN FOR NEXT SESSION:  Patient has had success in the pool before.  Will start her out with pool balance exercises and gait training.  Will progress to land.  She has today to develop some kind of exercise program that she can continue in the future.   Carney Living, PT 10/14/2022, 12:48 PM

## 2022-10-14 ENCOUNTER — Encounter (HOSPITAL_BASED_OUTPATIENT_CLINIC_OR_DEPARTMENT_OTHER): Payer: Self-pay | Admitting: Physical Therapy

## 2022-10-22 ENCOUNTER — Ambulatory Visit (HOSPITAL_BASED_OUTPATIENT_CLINIC_OR_DEPARTMENT_OTHER): Payer: Medicare Other | Admitting: Physical Therapy

## 2022-10-24 ENCOUNTER — Ambulatory Visit (HOSPITAL_BASED_OUTPATIENT_CLINIC_OR_DEPARTMENT_OTHER): Payer: Medicare Other | Admitting: Physical Therapy

## 2022-10-28 ENCOUNTER — Ambulatory Visit (HOSPITAL_BASED_OUTPATIENT_CLINIC_OR_DEPARTMENT_OTHER): Payer: Medicare Other | Admitting: Physical Therapy

## 2022-10-30 DIAGNOSIS — J0110 Acute frontal sinusitis, unspecified: Secondary | ICD-10-CM | POA: Diagnosis not present

## 2022-10-31 ENCOUNTER — Other Ambulatory Visit (HOSPITAL_COMMUNITY): Payer: Self-pay

## 2022-10-31 ENCOUNTER — Ambulatory Visit (HOSPITAL_BASED_OUTPATIENT_CLINIC_OR_DEPARTMENT_OTHER): Payer: Medicare Other | Admitting: Physical Therapy

## 2022-11-05 ENCOUNTER — Ambulatory Visit (HOSPITAL_BASED_OUTPATIENT_CLINIC_OR_DEPARTMENT_OTHER): Payer: Medicare Other | Admitting: Physical Therapy

## 2022-11-06 ENCOUNTER — Ambulatory Visit: Payer: Medicare Other | Admitting: Skilled Nursing Facility1

## 2022-11-07 ENCOUNTER — Encounter (HOSPITAL_BASED_OUTPATIENT_CLINIC_OR_DEPARTMENT_OTHER): Payer: Medicare Other | Admitting: Physical Therapy

## 2022-11-11 ENCOUNTER — Encounter: Payer: Medicare Other | Attending: Surgery | Admitting: Skilled Nursing Facility1

## 2022-11-11 ENCOUNTER — Encounter: Payer: Self-pay | Admitting: Skilled Nursing Facility1

## 2022-11-11 DIAGNOSIS — Z6838 Body mass index (BMI) 38.0-38.9, adult: Secondary | ICD-10-CM | POA: Diagnosis not present

## 2022-11-11 DIAGNOSIS — Z9884 Bariatric surgery status: Secondary | ICD-10-CM | POA: Diagnosis not present

## 2022-11-11 DIAGNOSIS — Z713 Dietary counseling and surveillance: Secondary | ICD-10-CM | POA: Insufficient documentation

## 2022-11-11 NOTE — Progress Notes (Signed)
Bariatric Nutrition Follow-Up Visit Medical Nutrition Therapy    NUTRITION ASSESSMENT   Surgery date: 10/08/2021 Surgery type: sleeve Start weight at [facility]: 327.9 Weight today: 211.6 pounds  Clinical  Medical hx: DVT, HTN, depression, sleep apnea Medications: see list  Labs: A1C 4.7 Notable signs/symptoms: pain, limited mobility, diarrhea not often Any previous deficiencies? No   Lifestyle & Dietary Hx  Pt states she finds walking easier after her weight loss. Pt states she sometimes gets diarrhea after she eats M&M's, peanuts, and Special K protein cereal. Pt states she does not look at calories on the label anymore. Pt states she looks more at protein and added sugar. Pt states her stomach often gets upset with sugar alcohols. Pt states she eats an apple or small orange everyday.  Pt states she only weighs herself at an appointment. Pt states she never feels like she is losing weight, but definitely notices a difference in how her clothing fits.   Body Composition Scale 11/11/2022  Current Body Weight 211.6  Total Body Fat % 45.0  Visceral Fat 17  Fat-Free Mass % 54.9   Total Body Water % 41.9  Muscle-Mass lbs 27.1  BMI 38.5  Body Fat Displacement          Torso  lbs 58.9         Left Leg  lbs 11.7         Right Leg  lbs 11.7         Left Arm  lbs 5.8         Right Arm   lbs 5.8     Estimated daily fluid intake: 48-50 oz Estimated daily protein intake: 60 g Supplements: multivitamin   Current average weekly physical activity: supposed to start water aerobics soon   24-Hr Dietary Recall: wakes around 5am First Meal: yogurt, banana, and granola or oatmeal  Snack: peanut butter  Second Meal: [place] rice with black beans and corn + toastios  Snack: small oranges or apple Third Meal: chicken + brown rice + broccoli  Snack: small graham crackers with 2% Lactaid milk  Beverages: water, decaf black coffee, 2% Lactaid milk  Post-Op Goals/ Signs/ Symptoms Using  straws: no Drinking while eating: no Chewing/swallowing difficulties: no Changes in vision: no Changes to mood/headaches: no Hair loss/changes to skin/nails: no Difficulty focusing/concentrating: no Sweating: no Limb weakness: no Dizziness/lightheadedness: no Palpitations: no  Carbonated/caffeinated beverages: no N/V/D/C/Gas: better Abdominal pain: no Dumping syndrome: no    NUTRITION DIAGNOSIS  Overweight/obesity ([place]-3.3) related to past poor dietary habits and physical inactivity as evidenced by completed bariatric surgery and following dietary guidelines for continued weight loss and healthy nutrition status.     NUTRITION INTERVENTION: continued Nutrition counseling (C-1) and education (E-2) to facilitate bariatric surgery goals, including: The importance of consuming adequate calories as well as certain nutrients daily due to the body's need for essential vitamins, minerals, and fats The importance of intuitive eating specifically learning hunger-satiety cues and understanding the importance of learning a new body: The importance of mindful eating to avoid grazing behaviors: portion sizes -Continue to aim for a minimum of 64 fluid ounces 7 days a week with at least  Encouraged patient to honor their body's internal hunger and fullness cues.  Throughout the day, check in mentally and rate hunger. Stop eating when satisfied not full regardless of how much food is left on the plate.  Get more if still hungry 20-30 minutes later.  The key is to honor satisfaction so  throughout the meal, rate fullness factor and stop when comfortably satisfied not physically full. The key is to honor hunger and fullness without any feelings of guilt or shame.  Pay attention to what the internal cues are, rather than any external factors. This will enhance the confidence you have in listening to your own body and following those internal cues enabling you to increase how often you eat when you are hungry not  out of appetite and stop when you are satisfied not full.  Encouraged pt to continue to eat balanced meals inclusive of non starchy vegetables 2 times a day 7 days a week Encouraged pt to choose lean protein sources: limiting beef, pork, sausage, hotdogs, and lunch meat Encourage pt to choose healthy fats such as plant based limiting animal fats Encouraged pt to continue to drink a minium 64 fluid ounces with half being plain water to satisfy proper hydration    Handouts Provided Include  1 Year Maintance Plan  Learning Style & Readiness for Change Teaching method utilized: Visual & Auditory  Demonstrated degree of understanding via: Teach Back  Readiness Level: Action Barriers to learning/adherence to lifestyle change: general physical weakness  RD's Notes for Next Visit Assess adherence to pt chosen goals    MONITORING & EVALUATION Dietary intake, weekly physical activity, body weight.  Next Steps Patient is to follow-up in 6 months.

## 2022-11-12 ENCOUNTER — Ambulatory Visit (HOSPITAL_BASED_OUTPATIENT_CLINIC_OR_DEPARTMENT_OTHER): Payer: Medicare Other | Admitting: Physical Therapy

## 2022-11-14 ENCOUNTER — Ambulatory Visit (HOSPITAL_BASED_OUTPATIENT_CLINIC_OR_DEPARTMENT_OTHER): Payer: Medicare Other | Admitting: Physical Therapy

## 2022-12-03 ENCOUNTER — Ambulatory Visit (HOSPITAL_BASED_OUTPATIENT_CLINIC_OR_DEPARTMENT_OTHER): Payer: Medicare Other | Attending: Family Medicine | Admitting: Physical Therapy

## 2022-12-03 ENCOUNTER — Encounter (HOSPITAL_BASED_OUTPATIENT_CLINIC_OR_DEPARTMENT_OTHER): Payer: Self-pay | Admitting: Physical Therapy

## 2022-12-03 DIAGNOSIS — R296 Repeated falls: Secondary | ICD-10-CM

## 2022-12-03 DIAGNOSIS — M25675 Stiffness of left foot, not elsewhere classified: Secondary | ICD-10-CM | POA: Insufficient documentation

## 2022-12-03 DIAGNOSIS — M6281 Muscle weakness (generalized): Secondary | ICD-10-CM | POA: Diagnosis not present

## 2022-12-03 DIAGNOSIS — R262 Difficulty in walking, not elsewhere classified: Secondary | ICD-10-CM

## 2022-12-03 NOTE — Therapy (Signed)
OUTPATIENT PHYSICAL THERAPY LOWER EXTREMITY EVALUATION   Patient Name: [name] MRN: [id number] DOB:01-20-1956, 67 y.o., female Today's Date: 12/03/2022  END OF SESSION:  PT End of Session - 12/03/22 1751     Visit Number 2    Number of Visits 18    Date for PT Re-Evaluation 01/28/23    Authorization Type [employer] Mcr    Progress Note Due on Visit 11    PT Start Time 1640    PT Stop Time 1720    PT Time Calculation (min) 40 min    Activity Tolerance Patient tolerated treatment well    Behavior During Therapy WFL for tasks assessed/performed             Past Medical History:  Diagnosis Date   Arthritis    Atrial fibrillation (HCC)    Chronic lower back pain    Depression    DVT (deep venous thrombosis) (HCC) 1990s   LLE   Dyspnea    Fallen arches    Family history of adverse reaction to anesthesia    Mother had rash and itching after anesthesia   Headache    Hypertension    Hyperthyroidism ~ 2000   "fine now" (04/23/2016)   Joint pain    Lactose intolerance    Leg edema    Obesity    Pneumonia 1980s X 1   Snoring    has not had sleep study but suspects sleep apnea   Past Surgical History:  Procedure Laterality Date   BIOPSY  08/09/2021   Procedure: BIOPSY;  Surgeon: Felicie Morn, MD;  Location: [facility];  Service: General;;   CARDIOVERSION N/A 06/18/2016   Procedure: CARDIOVERSION;  Surgeon: Larey Dresser, MD;  Location: [facility];  Service: Cardiovascular;  Laterality: N/A;   CARDIOVERSION N/A 07/11/2016   Procedure: CARDIOVERSION;  Surgeon: Lelon Perla, MD;  Location[facility];  Service: Cardiovascular;  Laterality: N/A;   CARDIOVERSION N/A 07/02/2017   Procedure: CARDIOVERSION;  Surgeon: Sanda Klein, MD;  Location: [place];  Service: Cardiovascular;  Laterality: N/A;   CARDIOVERSION N/A 09/29/2017   Procedure: CARDIOVERSION;  Surgeon: Larey Dresser, MD;  Location: [facility] ENDOSCOPY;  Service: Cardiovascular;  Laterality:  N/A;   ENDOMETRIAL ABLATION  ~2006   ESOPHAGOGASTRODUODENOSCOPY N/A 08/09/2021   Procedure: ESOPHAGOGASTRODUODENOSCOPY (EGD);  Surgeon: Felicie Morn, MD;  Location: [facility];  Service: General;  Laterality: N/A;   HIATAL HERNIA REPAIR N/A 10/08/2021   Procedure: HERNIA REPAIR HIATAL;  Surgeon: Clovis Riley, MD;  Location: [facility];  Service: General;  Laterality: N/A;   IR GUIDED DRAIN W CATHETER PLACEMENT  10/18/2021   LAPAROSCOPIC GASTRIC SLEEVE RESECTION N/A 10/08/2021   Procedure: LAPAROSCOPIC GASTRIC SLEEVE RESECTION;  Surgeon: Clovis Riley, MD;  Location: [facility];  Service: General;  Laterality: N/A;   TONSILLECTOMY  1960s   TUBAL LIGATION  1979   UPPER GI ENDOSCOPY N/A 10/08/2021   Procedure: UPPER GI ENDOSCOPY;  Surgeon: Clovis Riley, MD;  Location: [facility];  Service: General;  Laterality: N/A;   Patient Active Problem List   Diagnosis Date Noted   Postoperative hematoma of subcutaneous tissue following non-dermatologic procedure 10/17/2021   Insulin resistance 05/11/2020   DOE (dyspnea on exertion) 02/09/2020   Depression 09/26/2019   Vitamin D deficiency 09/01/2019   Class 3 severe obesity with serious comorbidity and body mass index (BMI) of 50.0 to 59.9 in adult [facility]) 09/01/2019   Prediabetes 08/15/2019   Atrial fibrillation ([place])  Chest pain 04/22/2016   Hypokalemia 04/22/2016   Morbid obesity ([place]) 04/22/2016   Hypertension 04/22/2016   Paroxysmal A-fib ([place]) 04/22/2016   Hypomagnesemia 04/22/2016    PCP: Harlan Stains MD   REFERRING PROVIDER: Harlan Stains   REFERRING DIAG:R29.6 (ICD-10-CM) - Repeated falls   THERAPY DIAG:  Difficulty in walking, not elsewhere classified  Muscle weakness (generalized)  Repeated falls  Rationale for Evaluation and Treatment: Rehabilitation  ONSET DATE: balance has become worse over the past few months  SUBJECTIVE:   SUBJECTIVE STATEMENT:Pt reports no new falls since evaluation assessment in  Dec.  Left shoulder pain with radicular symptoms into wrist/thumb.  It is relieved with increasing gabapentin. General MD has encouraged ortho if it continues. Reaching increases issues.  PERTINENT HISTORY: Arthritis, A-fib, DVT. Depression, Dyspnea, headaches, le edema,  Obesity; gastric bypass,   PAIN:   A little in her wrist but nothing consistent   PRECAUTIONS: Fall  WEIGHT BEARING RESTRICTIONS: No  FALLS:  Has patient fallen in last 6 months? Yes. Number of falls 3  Last fall: fell backwards int he shower  LIVING ENVIRONMENT: Ramp into the house  OCCUPATION:  On disability   Hobbies:  Traveling   PLOF: Independent and Independent with community mobility with device  PATIENT GOALS:  To have less falls   NEXT MD VISIT: every 2-3 months   OBJECTIVE:   DIAGNOSTIC FINDINGS:  Nothing recent   PATIENT SURVEYS:  FOTO   COGNITION: Overall cognitive status: Within functional limits for tasks assessed     SENSATION: WFL  EDEMA:  Past history of edema but no significant edema at this time   MUSCLE LENGTH:  POSTURE: No Significant postural limitations  PALPATION: No significant tenderness to palpation   LOWER EXTREMITY ROM:  Passive ROM Right eval Left eval  Hip flexion    Hip extension    Hip abduction    Hip adduction    Hip internal rotation    Hip external rotation    Knee flexion    Knee extension    Ankle dorsiflexion    Ankle plantarflexion    Ankle inversion    Ankle eversion     (Blank rows = not tested)  LOWER EXTREMITY MMT:  MMT Right eval Left eval Right / Left  Hip flexion 27.4 27.1 43.2 / 36.0  Hip extension     Hip abduction 26.5 30.4 25.7 / 23.6  Hip adduction     Hip internal rotation     Hip external rotation     Knee flexion     Knee extension 23.0 25.2 38.0 / 31.7  Ankle dorsiflexion     Ankle plantarflexion     Ankle inversion     Ankle eversion      (Blank rows = not tested)    FUNCTIONAL TESTS:  Berg  Balance Scale:  BERG Balance Test          Date:    12/03/22  Sit to Stand 3 3  Standing unsupported 3 3  Sitting with back unsupported but feet supported 4 4  Stand to sit  3 3  Transfers  4 3  Standing unsupported with eyes closed 4 3  Standing unsupported feet together 4 3  From standing position, reach forward with outstretched arm 3 2  From standing position, pick up object from floor 4 3  From standing position, turn and look behind over each shoulder 4 3  Turn 360 2 2  Standing unsupported, alternately place foot  on step 3 2  Standing unsupported, one foot in front 0 0  Standing on one leg 0 0  Total:  41 34/56    12/03/22 5X STS test: 22.19 from armed chair with use of ue TUG: 18.99 using cane  GAIT: The patient comes in today using her cane on the left side. The left side is where her foot issue is. She is now hoaving some discomort in her left shoulder. We reviewed proper use of the cane.   TODAY'S TREATMENT:                                                                                                                              Re-assessment Objective testing     PATIENT EDUCATION:  Education details: HEP, symptom manageent; safety in the home  Person educated: Patient Education method: Explanation, Demonstration, Tactile cues, and Verbal cues Education comprehension: verbalized understanding, returned demonstration, verbal cues required, tactile cues required, and needs further education  HOME EXERCISE PROGRAM: TBA  ASSESSMENT:  CLINICAL IMPRESSION: Re-assess: Pt was evaluated in dec 2023 but was unable to participate in therapy due to covid and subsequent sinus infections.  She presents today for re-assessment.  She has had weight loss surgery and has reduced her weight by >120lbs.  Objective testing demonstrates some improvement in strength in le but decrease balance ability increasing her fall risk.  She does report no falls since seen back in Dec.  Pt is  re-instructed on use of SPC in RUE rather than left which she demonstrates today.  She VU of rationale. Of note: she has hx of staff infection in left foot/ankle and R knee and has had her left foot "rebuilt" which has added to her occurences of falls. She reports increase in her activity over past 3 weeks with improved strength.  She will benefit from skilled PT; aquatic and land based to decrease fall risk, improve strength to improve functional mobility and ADL's. Plan first 4-6 visits aquatic then alternate with alnd sessions Re-complete Foto as it did not save   Initial Impression: Patient is a 67 year old female who comes into physical therapy following 3 falls over the course of the past 6 months.  She reports most of the falls happen when she is reaching last fall she fell backwards in the shower.  Her Berg balance score puts her at a mild fall risk.  She has baseline issues with her left lower extremity following the surgery.  OBJECTIVE IMPAIRMENTS: Abnormal gait, decreased activity tolerance, decreased balance, decreased endurance, decreased mobility, difficulty walking, decreased ROM, and decreased strength.   ACTIVITY LIMITATIONS: lifting, bending, standing, squatting, stairs, and locomotion level  PARTICIPATION LIMITATIONS: meal prep, cleaning, laundry, driving, shopping, occupation, and yard work  PERSONAL FACTORS: 1-2 comorbidities: left foot surgery; low back pain  are also affecting patient's functional outcome.   REHAB POTENTIAL: Good  CLINICAL DECISION MAKING: Evolving/moderate complexity  EVALUATION COMPLEXITY: Low   GOALS: Goals reviewed  with patient? Yes  SHORT TERM GOALS: Target date: 12/31/2022   Patient will be independent with basic balance and strength HEP  Baseline: Goal status: INITIAL  2.  Patient will increase BERG score by 4 points  Baseline: 33/56 Goal status: INITIAL  3.  Patient will increase gross bilateral LE strength by 5 lbs  Baseline: see  updated chart Goal status: INITIAL   LONG TERM GOALS: Target date: 01/28/24/2024    Patient will have no falls over a 2 weeks period prior to discharge  Baseline: multiple Goal status: INITIAL  2.  Patient will ambulate 3000' without loss of balance or pain in her left shoulder  Baseline:  Goal status: INITIAL  3.  Patient will have complete balance and strengthening plan  Baseline:  Goal status: INITIAL   4. Pt will improve on Tug to < 13s to demonstrate improved mobility   Baseline: 18.99 Goal Status: NEW  5. Pt will improve on 5 X STS test to <15s to demonstrate improved strength  Baseline: 22.19 from armed chair with use of ue PLAN:  PT FREQUENCY: 1-2x/week  PT DURATION: 8 weeks (allowing for potential scheduling difficulties)  PLANNED INTERVENTIONS: Therapeutic exercises, Therapeutic activity, Neuromuscular re-education, Balance training, Gait training, Patient/Family education, Self Care, Joint mobilization, DME instructions, Aquatic Therapy, Dry Needling, Electrical stimulation, Cryotherapy, Moist heat, and Manual therapy  PLAN FOR NEXT SESSION:  Patient has had success in the pool before.  Will start her out with pool balance exercises and gait training.  Will progress to land.  She has today to develop some kind of exercise program that she can continue in the future.   Corrie Dandy [facility]) Rylynne Schicker MPT 12/03/22 525p

## 2022-12-04 ENCOUNTER — Encounter (HOSPITAL_COMMUNITY): Payer: Self-pay | Admitting: *Deleted

## 2022-12-09 ENCOUNTER — Ambulatory Visit (HOSPITAL_BASED_OUTPATIENT_CLINIC_OR_DEPARTMENT_OTHER): Payer: Medicare Other | Admitting: Physical Therapy

## 2022-12-09 ENCOUNTER — Encounter (HOSPITAL_BASED_OUTPATIENT_CLINIC_OR_DEPARTMENT_OTHER): Payer: Self-pay | Admitting: Physical Therapy

## 2022-12-09 DIAGNOSIS — R296 Repeated falls: Secondary | ICD-10-CM

## 2022-12-09 DIAGNOSIS — M6281 Muscle weakness (generalized): Secondary | ICD-10-CM

## 2022-12-09 DIAGNOSIS — R262 Difficulty in walking, not elsewhere classified: Secondary | ICD-10-CM | POA: Diagnosis not present

## 2022-12-09 DIAGNOSIS — M25675 Stiffness of left foot, not elsewhere classified: Secondary | ICD-10-CM | POA: Diagnosis not present

## 2022-12-09 NOTE — Therapy (Signed)
OUTPATIENT PHYSICAL THERAPY LOWER EXTREMITY TREATMENT   Patient Name: [name] MRN: [id number] DOB:01/25/1956, 67 y.o., female Today's Date: 12/09/2022  END OF SESSION:  PT End of Session - 12/09/22 1415     Visit Number 3    Number of Visits 18    Date for PT Re-Evaluation 01/28/23    Authorization Type UHC MCR    Progress Note Due on Visit 11    PT Start Time 1402    PT Stop Time 1440    PT Time Calculation (min) 38 min    Behavior During Therapy WFL for tasks assessed/performed             Past Medical History:  Diagnosis Date   Arthritis    Atrial fibrillation (HCC)    Chronic lower back pain    Depression    DVT (deep venous thrombosis) (HCC) 1990s   LLE   Dyspnea    Fallen arches    Family history of adverse reaction to anesthesia    Mother had rash and itching after anesthesia   Headache    Hypertension    Hyperthyroidism ~ 2000   "fine now" (04/23/2016)   Joint pain    Lactose intolerance    Leg edema    Obesity    Pneumonia 1980s X 1   Snoring    has not had sleep study but suspects sleep apnea   Past Surgical History:  Procedure Laterality Date   BIOPSY  08/09/2021   Procedure: BIOPSY;  Surgeon: Felicie Morn, MD;  Location: [facility];  Service: General;;   CARDIOVERSION N/A 06/18/2016   Procedure: CARDIOVERSION;  Surgeon: Larey [facility]er, MD;  Location: [place];  Service: Cardiovascular;  Laterality: N/A;   CARDIOVERSION N/A 07/11/2016   Procedure: CARDIOVERSION;  Surgeon: Lelon Perla, MD;  Location: [facility] ENDOSCOPY;  Service: Cardiovascular;  Laterality: N/A;   CARDIOVERSION N/A 07/02/2017   Procedure: CARDIOVERSION;  Surgeon: Sanda Klein, MD;  Location: [facility];  Service: Cardiovascular;  Laterality: N/A;   CARDIOVERSION N/A 09/29/2017   Procedure: CARDIOVERSION;  Surgeon: Larey Dresser, MD;  Location: [facility] ENDOSCOPY;  Service: Cardiovascular;  Laterality: N/A;   ENDOMETRIAL ABLATION  ~2006    ESOPHAGOGASTRODUODENOSCOPY N/A 08/09/2021   Procedure: ESOPHAGOGASTRODUODENOSCOPY (EGD);  Surgeon: Felicie Morn, MD;  Location: [facility];  Service: General;  Laterality: N/A;   HIATAL HERNIA REPAIR N/A 10/08/2021   Procedure: HERNIA REPAIR HIATAL;  Surgeon: Clovis Riley, MD;  [facility];  Service: General;  Laterality: N/A;   IR GUIDED DRAIN W CATHETER PLACEMENT  10/18/2021   LAPAROSCOPIC GASTRIC SLEEVE RESECTION N/A 10/08/2021   Procedure: LAPAROSCOPIC GASTRIC SLEEVE RESECTION;  Surgeon: Clovis Riley, MD;  Location: [facility];  Service: General;  Laterality: N/A;   TONSILLECTOMY  1960s   TUBAL LIGATION  1979   UPPER GI ENDOSCOPY N/A 10/08/2021   Procedure: UPPER GI ENDOSCOPY;  Surgeon: Clovis Riley, MD;  Location[facility];  Service: General;  Laterality: N/A;   Patient Active Problem List   Diagnosis Date Noted   Postoperative hematoma of subcutaneous tissue following non-dermatologic procedure 10/17/2021   Insulin resistance 05/11/2020   DOE (dyspnea on exertion) 02/09/2020   Depression 09/26/2019   Vitamin D deficiency 09/01/2019   Class 3 severe obesity with serious comorbidity and body mass index (BMI) of 50.0 to 59.9 in adult [facility]) 09/01/2019   Prediabetes 08/15/2019   Atrial fibrillation ([place])    Chest pain 04/22/2016   Hypokalemia 04/22/2016  Morbid obesity ([place]) 04/22/2016   Hypertension 04/22/2016   Paroxysmal A-fib ([place]) 04/22/2016   Hypomagnesemia 04/22/2016    PCP: Harlan Stains MD   REFERRING PROVIDER: Harlan Stains   REFERRING DIAG:R29.6 (ICD-10-CM) - Repeated falls   THERAPY DIAG:  Difficulty in walking, not elsewhere classified  Muscle weakness (generalized)  Repeated falls  Rationale for Evaluation and Treatment: Rehabilitation  ONSET DATE: balance has become worse over the past few months  SUBJECTIVE:   SUBJECTIVE STATEMENT:Pt reports no new falls since evaluation assessment in Dec.  Continues to report Lt arm pain that  starts in shoulder blade area.   PERTINENT HISTORY: Arthritis, A-fib, DVT. Depression, Dyspnea, headaches, le edema,  Obesity; gastric bypass,   PAIN:   Are you in pain:  yes NPRS:  7/10 Location: Lt shoulder  Description: achy  PRECAUTIONS: Fall  WEIGHT BEARING RESTRICTIONS: No  FALLS:  Has patient fallen in last 6 months? Yes. Number of falls 3  Last fall: fell backwards int he shower  LIVING ENVIRONMENT: Ramp into the house  OCCUPATION:  On disability   Hobbies:  Traveling   PLOF: Independent and Independent with community mobility with device  PATIENT GOALS:  To have less falls   NEXT MD VISIT: every 2-3 months   OBJECTIVE:   DIAGNOSTIC FINDINGS:  Nothing recent   PATIENT SURVEYS:  FOTO   COGNITION: Overall cognitive status: Within functional limits for tasks assessed     SENSATION: WFL  EDEMA:  Past history of edema but no significant edema at this time   MUSCLE LENGTH:  POSTURE: No Significant postural limitations  PALPATION: No significant tenderness to palpation   LOWER EXTREMITY ROM:  Passive ROM Right eval Left eval  Hip flexion    Hip extension    Hip abduction    Hip adduction    Hip internal rotation    Hip external rotation    Knee flexion    Knee extension    Ankle dorsiflexion    Ankle plantarflexion    Ankle inversion    Ankle eversion     (Blank rows = not tested)  LOWER EXTREMITY MMT:  MMT Right eval Left eval Right / Left  Hip flexion 27.4 27.1 43.2 / 36.0  Hip extension     Hip abduction 26.5 30.4 25.7 / 23.6  Hip adduction     Hip internal rotation     Hip external rotation     Knee flexion     Knee extension 23.0 25.2 38.0 / 31.7  Ankle dorsiflexion     Ankle plantarflexion     Ankle inversion     Ankle eversion      (Blank rows = not tested)    FUNCTIONAL TESTS:  Berg Balance Scale:  BERG Balance Test          Date:    12/03/22  Sit to Stand 3 3  Standing unsupported 3 3  Sitting with back  unsupported but feet supported 4 4  Stand to sit  3 3  Transfers  4 3  Standing unsupported with eyes closed 4 3  Standing unsupported feet together 4 3  From standing position, reach forward with outstretched arm 3 2  From standing position, pick up object from floor 4 3  From standing position, turn and look behind over each shoulder 4 3  Turn 360 2 2  Standing unsupported, alternately place foot on step 3 2  Standing unsupported, one foot in front 0 0  Standing on  one leg 0 0  Total:  41 34/56    12/03/22 5X STS test: 22.19 from armed chair with use of ue TUG: 18.99 using cane  GAIT: The patient comes in today using her cane on the left side. The left side is where her foot issue is. She is now hoaving some discomort in her left shoulder. We reviewed proper use of the cane.   TODAY'S TREATMENT:                                                                                                                              Pt seen for aquatic therapy today.  Treatment took place in water 3.25-4.5 ft in depth at the [facility]. Temp of water was 91.  Pt entered/exited the pool via stairs in step-to pattern independently with bilat rail. * holding white barbell: walking forward/backward and side stepping * without UE support:  walking forward/ backward; tandem gait forward/ backward * holding rainbow hand floats:  SLS with 3 way toe touch x 10 each;  heel raises x 10 * TrA set with short blue noodle pull down to thighs x 10 * staggered stance with kick board push/ pull x 10 each LE forward * STS at bench with feet on blue step x 15 (cues for hip hinge and controlled descent) * side stepping with increased step height  Pt requires the buoyancy and hydrostatic pressure of water for support, and to offload joints by unweighting joint load by at least 50 % in navel deep water and by at least 75-80% in chest to neck deep water.  Viscosity of the water is needed for resistance of  strengthening. Water current perturbations provides challenge to standing balance requiring increased core activation.  PATIENT EDUCATION:  Education details: aquatic rehab review and progressions Person educated: Patient Education method: Explanation, Demonstration, Tactile cues, and Verbal cues Education comprehension: verbalized understanding, returned demonstration, verbal cues required, tactile cues required, and needs further education  HOME EXERCISE PROGRAM: TBA  ASSESSMENT:  CLINICAL IMPRESSION: Pt reported some tightening in lower back with SLS exercises; otherwise, pt tolerated all exercises well without increase in pain. Initially started session with UE support on floatation device, but able to quickly progress to no floatation or UE support.  Pt able to take direction from therapist on deck; has good confidence in water.   She will benefit from skilled PT; aquatic and land based to decrease fall risk, improve strength to improve functional mobility and ADL's. Goals are ongoing.    OBJECTIVE IMPAIRMENTS: Abnormal gait, decreased activity tolerance, decreased balance, decreased endurance, decreased mobility, difficulty walking, decreased ROM, and decreased strength.   ACTIVITY LIMITATIONS: lifting, bending, standing, squatting, stairs, and locomotion level  PARTICIPATION LIMITATIONS: meal prep, cleaning, laundry, driving, shopping, occupation, and yard work  PERSONAL FACTORS: 1-2 comorbidities: left foot surgery; low back pain  are also affecting patient's functional outcome.   REHAB POTENTIAL: Good  CLINICAL DECISION MAKING: Evolving/moderate  complexity  EVALUATION COMPLEXITY: Low   GOALS: Goals reviewed with patient? Yes  SHORT TERM GOALS: Target date: 12/31/2022   Patient will be independent with basic balance and strength HEP  Baseline: Goal status: INITIAL  2.  Patient will increase BERG score by 4 points  Baseline: 33/56 Goal status: INITIAL  3.  Patient  will increase gross bilateral LE strength by 5 lbs  Baseline: see updated chart Goal status: INITIAL   LONG TERM GOALS: Target date: 01/28/24/2024    Patient will have no falls over a 2 weeks period prior to discharge  Baseline: multiple Goal status: INITIAL  2.  Patient will ambulate 3000' without loss of balance or pain in her left shoulder  Baseline:  Goal status: INITIAL  3.  Patient will have complete balance and strengthening plan  Baseline:  Goal status: INITIAL   4. Pt will improve on Tug to < 13s to demonstrate improved mobility   Baseline: 18.99 Goal Status: NEW  5. Pt will improve on 5 X STS test to <15s to demonstrate improved strength  Baseline: 22.19 from armed chair with use of ue PLAN:  PT FREQUENCY: 1-2x/week  PT DURATION: 8 weeks (allowing for potential scheduling difficulties)  PLANNED INTERVENTIONS: Therapeutic exercises, Therapeutic activity, Neuromuscular re-education, Balance training, Gait training, Patient/Family education, Self Care, Joint mobilization, DME instructions, Aquatic Therapy, Dry Needling, Electrical stimulation, Cryotherapy, Moist heat, and Manual therapy  PLAN FOR NEXT SESSION:  Patient has had success in the pool before.  Will start her out with pool balance exercises and gait training.  Will progress to land. Plan first 4-6 visits aquatic then alternate with alnd sessions.  Kerin Perna, PTA 12/09/22 2:41 PM [facility] [address] [place], [place], [address] Phone: [phone]   Fax:  [phone]

## 2022-12-17 ENCOUNTER — Encounter (HOSPITAL_BASED_OUTPATIENT_CLINIC_OR_DEPARTMENT_OTHER): Payer: Self-pay | Admitting: Physical Therapy

## 2022-12-17 ENCOUNTER — Ambulatory Visit (HOSPITAL_BASED_OUTPATIENT_CLINIC_OR_DEPARTMENT_OTHER): Payer: Medicare Other | Admitting: Physical Therapy

## 2022-12-17 DIAGNOSIS — M25675 Stiffness of left foot, not elsewhere classified: Secondary | ICD-10-CM | POA: Diagnosis not present

## 2022-12-17 DIAGNOSIS — R262 Difficulty in walking, not elsewhere classified: Secondary | ICD-10-CM

## 2022-12-17 DIAGNOSIS — R296 Repeated falls: Secondary | ICD-10-CM | POA: Diagnosis not present

## 2022-12-17 DIAGNOSIS — M6281 Muscle weakness (generalized): Secondary | ICD-10-CM | POA: Diagnosis not present

## 2022-12-17 NOTE — Therapy (Signed)
OUTPATIENT PHYSICAL THERAPY LOWER EXTREMITY TREATMENT   Patient Name: [name] MRN: [id number] DOB:02-Dec-1955, 67 y.o., female Today's Date: 12/17/2022  END OF SESSION:  PT End of Session - 12/17/22 0958     Visit Number 4    Number of Visits 18    Date for PT Re-Evaluation 01/28/23    Authorization Type U[employer] MCR    Progress Note Due on Visit 11    PT Start Time 0950    PT Stop Time 1030    PT Time Calculation (min) 40 min    Activity Tolerance Patient tolerated treatment well    Behavior During Therapy WFL for tasks assessed/performed              Past Medical History:  Diagnosis Date   Arthritis    Atrial fibrillation (HCC)    Chronic lower back pain    Depression    DVT (deep venous thrombosis) (HCC) 1990s   LLE   Dyspnea    Fallen arches    Family history of adverse reaction to anesthesia    Mother had rash and itching after anesthesia   Headache    Hypertension    Hyperthyroidism ~ 2000   "fine now" (04/23/2016)   Joint pain    Lactose intolerance    Leg edema    Obesity    Pneumonia 1980s X 1   Snoring    has not had sleep study but suspects sleep apnea   Past Surgical History:  Procedure Laterality Date   BIOPSY  08/09/2021   Procedure: BIOPSY;  Surgeon: Felicie Morn, MD;  Location: [facility];  Service: General;;   CARDIOVERSION N/A 06/18/2016   Procedure: CARDIOVERSION;  Surgeon: Larey Dresser, MD;  Location: [place];  Service: Cardiovascular;  Laterality: N/A;   CARDIOVERSION N/A 07/11/2016   Procedure: CARDIOVERSION;  Surgeon: Lelon Perla, MD;  Location: [facility];  Service: Cardiovascular;  Laterality: N/A;   CARDIOVERSION N/A 07/02/2017   Procedure: CARDIOVERSION;  Surgeon: Sanda Klein, MD;  Location: [facility];  Service: Cardiovascular;  Laterality: N/A;   CARDIOVERSION N/A 09/29/2017   Procedure: CARDIOVERSION;  Surgeon: Larey Dresser, MD;  Location: [facility] ENDOSCOPY;  Service: Cardiovascular;  Laterality:  N/A;   ENDOMETRIAL ABLATION  ~2006   ESOPHAGOGASTRODUODENOSCOPY N/A 08/09/2021   Procedure: ESOPHAGOGASTRODUODENOSCOPY (EGD);  Surgeon: Felicie Morn, MD;  Location: [facility];  Service: General;  Laterality: N/A;   HIATAL HERNIA REPAIR N/A 10/08/2021   Procedure: HERNIA REPAIR HIATAL;  Surgeon: Clovis Riley, MD;  Location: [facility];  Service: General;  Laterality: N/A;   IR GUIDED DRAIN W CATHETER PLACEMENT  10/18/2021   LAPAROSCOPIC GASTRIC SLEEVE RESECTION N/A 10/08/2021   Procedure: LAPAROSCOPIC GASTRIC SLEEVE RESECTION;  Surgeon: Clovis Riley, MD;  Location: [facility];  Service: General;  Laterality: N/A;   TONSILLECTOMY  1960s   TUBAL LIGATION  1979   UPPER GI ENDOSCOPY N/A 10/08/2021   Procedure: UPPER GI ENDOSCOPY;  Surgeon: Clovis Riley, MD;  Location: [facility];  Service: General;  Laterality: N/A;   Patient Active Problem List   Diagnosis Date Noted   Postoperative hematoma of subcutaneous tissue following non-dermatologic procedure 10/17/2021   Insulin resistance 05/11/2020   DOE (dyspnea on exertion) 02/09/2020   Depression 09/26/2019   Vitamin D deficiency 09/01/2019   Class 3 severe obesity with serious comorbidity and body mass index (BMI) of 50.0 to 59.9 in adult [facility]) 09/01/2019   Prediabetes 08/15/2019   Atrial fibrillation ([place])  Chest pain 04/22/2016   Hypokalemia 04/22/2016   Morbid obesity ([place]) 04/22/2016   Hypertension 04/22/2016   Paroxysmal A-fib ([place]) 04/22/2016   Hypomagnesemia 04/22/2016    PCP: Harlan Stains MD   REFERRING PROVIDER: Harlan Stains   REFERRING DIAG:R29.6 (ICD-10-CM) - Repeated falls   THERAPY DIAG:  Difficulty in walking, not elsewhere classified  Muscle weakness (generalized)  Repeated falls  Rationale for Evaluation and Treatment: Rehabilitation  ONSET DATE: balance has become worse over the past few months  SUBJECTIVE:   SUBJECTIVE STATEMENT:Pt reports some glut soreness after last treatment which  passed quickly.  "Will be seeing Dr. Tamera Punt in regards to left shoulder sometime soon as per PCP"   PERTINENT HISTORY: Arthritis, A-fib, DVT. Depression, Dyspnea, headaches, le edema,  Obesity; gastric bypass,   PAIN:   Are you in pain:  yes NPRS:  3/10 Location: Lt shoulder  Description: achy  PRECAUTIONS: Fall  WEIGHT BEARING RESTRICTIONS: No  FALLS:  Has patient fallen in last 6 months? Yes. Number of falls 3  Last fall: fell backwards int he shower  LIVING ENVIRONMENT: Ramp into the house  OCCUPATION:  On disability   Hobbies:  Traveling   PLOF: Independent and Independent with community mobility with device  PATIENT GOALS:  To have less falls   NEXT MD VISIT: every 2-3 months   OBJECTIVE:   DIAGNOSTIC FINDINGS:  Nothing recent   PATIENT SURVEYS:  FOTO   COGNITION: Overall cognitive status: Within functional limits for tasks assessed     SENSATION: WFL  EDEMA:  Past history of edema but no significant edema at this time   MUSCLE LENGTH:  POSTURE: No Significant postural limitations  PALPATION: No significant tenderness to palpation   LOWER EXTREMITY ROM:  Passive ROM Right eval Left eval  Hip flexion    Hip extension    Hip abduction    Hip adduction    Hip internal rotation    Hip external rotation    Knee flexion    Knee extension    Ankle dorsiflexion    Ankle plantarflexion    Ankle inversion    Ankle eversion     (Blank rows = not tested)  LOWER EXTREMITY MMT:  MMT Right eval Left eval Right / Left  Hip flexion 27.4 27.1 43.2 / 36.0  Hip extension     Hip abduction 26.5 30.4 25.7 / 23.6  Hip adduction     Hip internal rotation     Hip external rotation     Knee flexion     Knee extension 23.0 25.2 38.0 / 31.7  Ankle dorsiflexion     Ankle plantarflexion     Ankle inversion     Ankle eversion      (Blank rows = not tested)    FUNCTIONAL TESTS:  Berg Balance Scale:  BERG Balance Test          Date:     12/03/22  Sit to Stand 3 3  Standing unsupported 3 3  Sitting with back unsupported but feet supported 4 4  Stand to sit  3 3  Transfers  4 3  Standing unsupported with eyes closed 4 3  Standing unsupported feet together 4 3  From standing position, reach forward with outstretched arm 3 2  From standing position, pick up object from floor 4 3  From standing position, turn and look behind over each shoulder 4 3  Turn 360 2 2  Standing unsupported, alternately place foot on step 3  2  Standing unsupported, one foot in front 0 0  Standing on one leg 0 0  Total:  41 34/56    12/03/22 5X STS test: 22.19 from armed chair with use of ue TUG: 18.99 using cane  GAIT: The patient comes in today using her cane on the left side. The left side is where her foot issue is. She is now having some discomort in her left shoulder. We reviewed proper use of the cane.   TODAY'S TREATMENT:                                                                                                                              Pt seen for aquatic therapy today.  Treatment took place in water 3.25-4.5 ft in depth at the [facility]. Temp of water was 91.  Pt entered/exited the pool via stairs in step-to pattern independently with bilat rail. * unsupported: walking forward/backward and side stepping  - toe raises; heel raises x 12 * without UE support:  walking forward/ backward; tandem gait forward/ backward;  *SLS chest deep ue support yellow hand buoys R/L >20s; progressed to 1 foam jbuoys R/L >20s;UE add/abd right X10 wihtout LOB, L x 3-4 with LOB * holding rainbow hand floats chest deep:  SLS with 3 way toe touch x 10 each;  hip flex/ext R/L alternating x10 * STS at bench with feet on blue step x 10 (cues for hip hinge and controlled descent) *hip hinging x 8   Pt requires the buoyancy and hydrostatic pressure of water for support, and to offload joints by unweighting joint load by at least 50 % in  navel deep water and by at least 75-80% in chest to neck deep water.  Viscosity of the water is needed for resistance of strengthening. Water current perturbations provides challenge to standing balance requiring increased core activation.  PATIENT EDUCATION:  Education details: aquatic rehab review and progressions Person educated: Patient Education method: Explanation, Demonstration, Tactile cues, and Verbal cues Education comprehension: verbalized understanding, returned demonstration, verbal cues required, tactile cues required, and needs further education  HOME EXERCISE PROGRAM: TBA  ASSESSMENT:  CLINICAL IMPRESSION: Focus on standing balance and core strength. Cues and demonstration required for proper execution of STS and hip hinging to decrease strain/use of LB which she has difficulty with, will need further instruction. Decreased UE support tolerated well for increased core engagement and higher level balance challenges. Goals ongoing    OBJECTIVE IMPAIRMENTS: Abnormal gait, decreased activity tolerance, decreased balance, decreased endurance, decreased mobility, difficulty walking, decreased ROM, and decreased strength.   ACTIVITY LIMITATIONS: lifting, bending, standing, squatting, stairs, and locomotion level  PARTICIPATION LIMITATIONS: meal prep, cleaning, laundry, driving, shopping, occupation, and yard work  PERSONAL FACTORS: 1-2 comorbidities: left foot surgery; low back pain  are also affecting patient's functional outcome.   REHAB POTENTIAL: Good  CLINICAL DECISION MAKING: Evolving/moderate complexity  EVALUATION COMPLEXITY: Low   GOALS: Goals reviewed with  patient? Yes  SHORT TERM GOALS: Target date: 12/31/2022   Patient will be independent with basic balance and strength HEP  Baseline: Goal status: INITIAL  2.  Patient will increase BERG score by 4 points  Baseline: 33/56 Goal status: INITIAL  3.  Patient will increase gross bilateral LE strength by 5  lbs  Baseline: see updated chart Goal status: INITIAL   LONG TERM GOALS: Target date: 01/28/24/2024    Patient will have no falls over a 2 weeks period prior to discharge  Baseline: multiple Goal status: INITIAL  2.  Patient will ambulate 3000' without loss of balance or pain in her left shoulder  Baseline:  Goal status: INITIAL  3.  Patient will have complete balance and strengthening plan  Baseline:  Goal status: INITIAL   4. Pt will improve on Tug to < 13s to demonstrate improved mobility   Baseline: 18.99 Goal Status: NEW  5. Pt will improve on 5 X STS test to <15s to demonstrate improved strength  Baseline: 22.19 from armed chair with use of ue PLAN:  PT FREQUENCY: 1-2x/week  PT DURATION: 8 weeks (allowing for potential scheduling difficulties)  PLANNED INTERVENTIONS: Therapeutic exercises, Therapeutic activity, Neuromuscular re-education, Balance training, Gait training, Patient/Family education, Self Care, Joint mobilization, DME instructions, Aquatic Therapy, Dry Needling, Electrical stimulation, Cryotherapy, Moist heat, and Manual therapy  PLAN FOR NEXT SESSION:  Patient has had success in the pool before.  Will start her out with pool balance exercises and gait training.  Will progress to land. Plan first 4-6 visits aquatic then alternate with land sessions.  [address] [place]) Jersi Mcmaster MPT 12/17/22 10:00 AM [facility] [address] [place], [place], [address] Phone: [phone]   Fax:  [phone]

## 2022-12-22 ENCOUNTER — Encounter (HOSPITAL_BASED_OUTPATIENT_CLINIC_OR_DEPARTMENT_OTHER): Payer: Self-pay | Admitting: Physical Therapy

## 2022-12-22 ENCOUNTER — Ambulatory Visit (HOSPITAL_BASED_OUTPATIENT_CLINIC_OR_DEPARTMENT_OTHER): Payer: Medicare Other | Admitting: Physical Therapy

## 2022-12-22 DIAGNOSIS — M25675 Stiffness of left foot, not elsewhere classified: Secondary | ICD-10-CM | POA: Diagnosis not present

## 2022-12-22 DIAGNOSIS — M6281 Muscle weakness (generalized): Secondary | ICD-10-CM | POA: Diagnosis not present

## 2022-12-22 DIAGNOSIS — R296 Repeated falls: Secondary | ICD-10-CM | POA: Diagnosis not present

## 2022-12-22 DIAGNOSIS — R262 Difficulty in walking, not elsewhere classified: Secondary | ICD-10-CM

## 2022-12-22 NOTE — Therapy (Signed)
OUTPATIENT PHYSICAL THERAPY LOWER EXTREMITY TREATMENT   Patient Name: [name] MRN: [id number] DOB:[birth year], 67 y.o., female Today's Date: 12/22/2022  END OF SESSION:  PT End of Session - 12/22/22 1038     Visit Number 5    Number of Visits 18    Date for PT Re-Evaluation 01/28/23    Authorization Type UHC MCR    Progress Note Due on Visit 11    PT Start Time 1033    PT Stop Time 1111    PT Time Calculation (min) 38 min    Behavior During Therapy WFL for tasks assessed/performed              Past Medical History:  Diagnosis Date   Arthritis    Atrial fibrillation (HCC)    Chronic lower back pain    Depression    DVT (deep venous thrombosis) (HCC) 1990s   LLE   Dyspnea    Fallen arches    Family history of adverse reaction to anesthesia    Mother had rash and itching after anesthesia   Headache    Hypertension    Hyperthyroidism ~ 2000   "fine now" (04/23/2016)   Joint pain    Lactose intolerance    Leg edema    Obesity    Pneumonia 1980s X 1   Snoring    has not had sleep study but suspects sleep apnea   Past Surgical History:  Procedure Laterality Date   BIOPSY  08/09/2021   Procedure: BIOPSY;  Surgeon: Felicie Morn, MD;  Location: [facility];  Service: General;;   CARDIOVERSION N/A 06/18/2016   Procedure: CARDIOVERSION;  Surgeon: Larey Dresser, MD;  Location: [place];  Service: Cardiovascular;  Laterality: N/A;   CARDIOVERSION N/A 07/11/2016   Procedure: CARDIOVERSION;  Surgeon: Lelon Perla, MD;  Location: [facility];  Service: Cardiovascular;  Laterality: N/A;   CARDIOVERSION N/A 07/02/2017   Procedure: CARDIOVERSION;  Surgeon: Sanda Klein, MD;  Location: [facility];  Service: Cardiovascular;  Laterality: N/A;   CARDIOVERSION N/A 09/29/2017   Procedure: CARDIOVERSION;  Surgeon: Larey Dresser, MD;  Location: [facility] ENDOSCOPY;  Service: Cardiovascular;  Laterality: N/A;   ENDOMETRIAL ABLATION  ~2006    ESOPHAGOGASTRODUODENOSCOPY N/A 08/09/2021   Procedure: ESOPHAGOGASTRODUODENOSCOPY (EGD);  Surgeon: Felicie Morn, MD;  Location: [facility];  Service: General;  Laterality: N/A;   HIATAL HERNIA REPAIR N/A 10/08/2021   Procedure: HERNIA REPAIR HIATAL;  Surgeon: Clovis Riley, MD;  [facility];  Service: General;  Laterality: N/A;   IR GUIDED DRAIN W CATHETER PLACEMENT  10/18/2021   LAPAROSCOPIC GASTRIC SLEEVE RESECTION N/A 10/08/2021   Procedure: LAPAROSCOPIC GASTRIC SLEEVE RESECTION;  Surgeon: Clovis Riley, MD;  Location[facility];  Service: General;  Laterality: N/A;   TONSILLECTOMY  1960s   TUBAL LIGATION  1979   UPPER GI ENDOSCOPY N/A 10/08/2021   Procedure: UPPER GI ENDOSCOPY;  Surgeon: Clovis Riley, MD;  Location[facility];  Service: General;  Laterality: N/A;   Patient Active Problem List   Diagnosis Date Noted   Postoperative hematoma of subcutaneous tissue following non-dermatologic procedure 10/17/2021   Insulin resistance 05/11/2020   DOE (dyspnea on exertion) 02/09/2020   Depression 09/26/2019   Vitamin D deficiency 09/01/2019   Class 3 severe obesity with serious comorbidity and body mass index (BMI) of 50.0 to 59.9 in adult [facility]) 09/01/2019   Prediabetes 08/15/2019   Atrial fibrillation ([place])    Chest pain 04/22/2016   Hypokalemia 04/22/2016  Morbid obesity ([place]) 04/22/2016   Hypertension 04/22/2016   Paroxysmal A-fib ([place]) 04/22/2016   Hypomagnesemia 04/22/2016    PCP: Harlan Stains MD   REFERRING PROVIDER: Harlan Stains   REFERRING DIAG:R29.6 (ICD-10-CM) - Repeated falls   THERAPY DIAG:  Difficulty in walking, not elsewhere classified  Muscle weakness (generalized)  Rationale for Evaluation and Treatment: Rehabilitation  ONSET DATE: balance has become worse over the past few months  SUBJECTIVE:   SUBJECTIVE STATEMENT:Pt reports she did ok after last session, wasn't too sore. Is awaiting call back from Dr. Bettina Gavia office about  an appt.   PERTINENT HISTORY: Arthritis, A-fib, DVT. Depression, Dyspnea, headaches, LE edema,  Obesity; gastric bypass,   PAIN:   Are you in pain:  yes NPRS:  3/10 Location: Lt shoulder / arm  Description: achy  PRECAUTIONS: Fall  WEIGHT BEARING RESTRICTIONS: No  FALLS:  Has patient fallen in last 6 months? Yes. Number of falls 3  Last fall: fell backwards int he shower  LIVING ENVIRONMENT: Ramp into the house  OCCUPATION:  On disability   Hobbies:  Traveling   PLOF: Independent and Independent with community mobility with device  PATIENT GOALS:  To have less falls   NEXT MD VISIT: every 2-3 months   OBJECTIVE:   DIAGNOSTIC FINDINGS:  Nothing recent   PATIENT SURVEYS:  FOTO   COGNITION: Overall cognitive status: Within functional limits for tasks assessed     SENSATION: WFL  EDEMA:  Past history of edema but no significant edema at this time   MUSCLE LENGTH:  POSTURE: No Significant postural limitations  PALPATION: No significant tenderness to palpation   LOWER EXTREMITY ROM:  Passive ROM Right eval Left eval  Hip flexion    Hip extension    Hip abduction    Hip adduction    Hip internal rotation    Hip external rotation    Knee flexion    Knee extension    Ankle dorsiflexion    Ankle plantarflexion    Ankle inversion    Ankle eversion     (Blank rows = not tested)  LOWER EXTREMITY MMT:  MMT Right eval Left eval Right / Left  Hip flexion 27.4 27.1 43.2 / 36.0  Hip extension     Hip abduction 26.5 30.4 25.7 / 23.6  Hip adduction     Hip internal rotation     Hip external rotation     Knee flexion     Knee extension 23.0 25.2 38.0 / 31.7  Ankle dorsiflexion     Ankle plantarflexion     Ankle inversion     Ankle eversion      (Blank rows = not tested)    FUNCTIONAL TESTS:  Berg Balance Scale:  BERG Balance Test          Date:    12/03/22  Sit to Stand 3 3  Standing unsupported 3 3  Sitting with back unsupported but  feet supported 4 4  Stand to sit  3 3  Transfers  4 3  Standing unsupported with eyes closed 4 3  Standing unsupported feet together 4 3  From standing position, reach forward with outstretched arm 3 2  From standing position, pick up object from floor 4 3  From standing position, turn and look behind over each shoulder 4 3  Turn 360 2 2  Standing unsupported, alternately place foot on step 3 2  Standing unsupported, one foot in front 0 0  Standing on one leg  0 0  Total:  41 34/56    12/03/22 5X STS test: 22.19 from armed chair with use of UE TUG: 18.99 using cane  GAIT: The patient comes in today using her cane on the left side. The left side is where her foot issue is. She is now having some discomort in her left shoulder. We reviewed proper use of the cane.   TODAY'S TREATMENT:                                                                                                                              Pt seen for aquatic therapy today.  Treatment took place in water 3.5-4 ft in depth at the [facility]. Temp of water was 91.  Pt entered/exited the pool via stairs in step-to pattern independently with bilat rail. * unsupported: walking forward/backward and side stepping * with fins on wrists:  staggered stance with reciprocal arms swing, wide stance with shoulder horiz abdct/addct  * holding rainbow hand floats: 3 way toe tap x 10 each LE; hip flex/ext R/L x10 each; Hip abdct/ addct x 10 each * marching forward/ backward  * tandem gait forward/backward without support  * TUG like exercise 5x * heel raises;  squats (allowing heels to come up) * walking forward/backward with single rainbow hand float at side, for core engagement   Pt requires the buoyancy and hydrostatic pressure of water for support, and to offload joints by unweighting joint load by at least 50 % in navel deep water and by at least 75-80% in chest to neck deep water.  Viscosity of the water is needed  for resistance of strengthening. Water current perturbations provides challenge to standing balance requiring increased core activation.  PATIENT EDUCATION:  Education details: aquatic rehab review and progressions Person educated: Patient Education method: Explanation, Demonstration, Tactile cues, and Verbal cues Education comprehension: verbalized understanding, returned demonstration, verbal cues required, tactile cues required, and needs further education  HOME EXERCISE PROGRAM: TBA  ASSESSMENT:  CLINICAL IMPRESSION: Pt tolerating aquatic exercises well, without increase in UE or back pain.  Continued focus on good posture, core engagement, and balance.  Goals ongoing.  Will plan to test TUG next week.      OBJECTIVE IMPAIRMENTS: Abnormal gait, decreased activity tolerance, decreased balance, decreased endurance, decreased mobility, difficulty walking, decreased ROM, and decreased strength.   ACTIVITY LIMITATIONS: lifting, bending, standing, squatting, stairs, and locomotion level  PARTICIPATION LIMITATIONS: meal prep, cleaning, laundry, driving, shopping, occupation, and yard work  PERSONAL FACTORS: 1-2 comorbidities: left foot surgery; low back pain  are also affecting patient's functional outcome.   REHAB POTENTIAL: Good  CLINICAL DECISION MAKING: Evolving/moderate complexity  EVALUATION COMPLEXITY: Low   GOALS: Goals reviewed with patient? Yes  SHORT TERM GOALS: Target date: 12/31/2022   Patient will be independent with basic balance and strength HEP  Baseline: Goal status: INITIAL  2.  Patient will increase BERG score by 4 points  Baseline:  33/56 Goal status: INITIAL  3.  Patient will increase gross bilateral LE strength by 5 lbs  Baseline: see updated chart Goal status: INITIAL   LONG TERM GOALS: Target date: 01/28/24/2024    Patient will have no falls over a 2 weeks period prior to discharge  Baseline: multiple Goal status: INITIAL  2.  Patient will  ambulate 3000' without loss of balance or pain in her left shoulder  Baseline:  Goal status: INITIAL  3.  Patient will have complete balance and strengthening plan  Baseline:  Goal status: INITIAL   4. Pt will improve on Tug to < 13s to demonstrate improved mobility   Baseline: 18.99 Goal Status: NEW  5. Pt will improve on 5 X STS test to <15s to demonstrate improved strength  Baseline: 22.19 from armed chair with use of ue PLAN:  PT FREQUENCY: 1-2x/week  PT DURATION: 8 weeks (allowing for potential scheduling difficulties)  PLANNED INTERVENTIONS: Therapeutic exercises, Therapeutic activity, Neuromuscular re-education, Balance training, Gait training, Patient/Family education, Self Care, Joint mobilization, DME instructions, Aquatic Therapy, Dry Needling, Electrical stimulation, Cryotherapy, Moist heat, and Manual therapy  PLAN FOR NEXT SESSION:  Patient has had success in the pool before.  Will start her out with pool balance exercises and gait training.  Will progress to land. Plan first 4-6 visits aquatic then alternate with land sessions.  Kerin Perna, PTA 12/22/22 1:03 PM [facility] [address] [place], [place], [address] Phone: [phone]   Fax:  [phone]

## 2022-12-24 ENCOUNTER — Encounter (HOSPITAL_BASED_OUTPATIENT_CLINIC_OR_DEPARTMENT_OTHER): Payer: Self-pay | Admitting: Physical Therapy

## 2022-12-24 ENCOUNTER — Ambulatory Visit (HOSPITAL_BASED_OUTPATIENT_CLINIC_OR_DEPARTMENT_OTHER): Payer: Medicare Other | Admitting: Physical Therapy

## 2022-12-24 DIAGNOSIS — R296 Repeated falls: Secondary | ICD-10-CM

## 2022-12-24 DIAGNOSIS — M6281 Muscle weakness (generalized): Secondary | ICD-10-CM

## 2022-12-24 DIAGNOSIS — M25675 Stiffness of left foot, not elsewhere classified: Secondary | ICD-10-CM

## 2022-12-24 DIAGNOSIS — R262 Difficulty in walking, not elsewhere classified: Secondary | ICD-10-CM | POA: Diagnosis not present

## 2022-12-24 IMAGING — CT CT ANGIO NECK
3 of 8 series · 10 of 36 positions shown · IV contrast (APPLIED)
Comparison: No pertinent prior exams available for comparison.

CLINICAL DATA: Carotid artery dissection suspected. Evaluate for
dissection

EXAM:
CT ANGIOGRAPHY NECK
TECHNIQUE: Multidetector CT imaging of the neck was performed using the
standard protocol during bolus administration of intravenous
contrast. Multiplanar CT image reconstructions and MIPs were
obtained to evaluate the vascular anatomy. Carotid stenosis
measurements (when applicable) are obtained utilizing NASCET
criteria, using the distal internal carotid diameter as the
denominator.
CONTRAST:  150mL OMNIPAQUE IOHEXOL 350 MG/ML SOLN

[Series 6: cta neck · axial · 0.65mm/px · z∈[-337,-183]mm · 6 of 433 slices shown]
[im 62/433  soft-tissue]
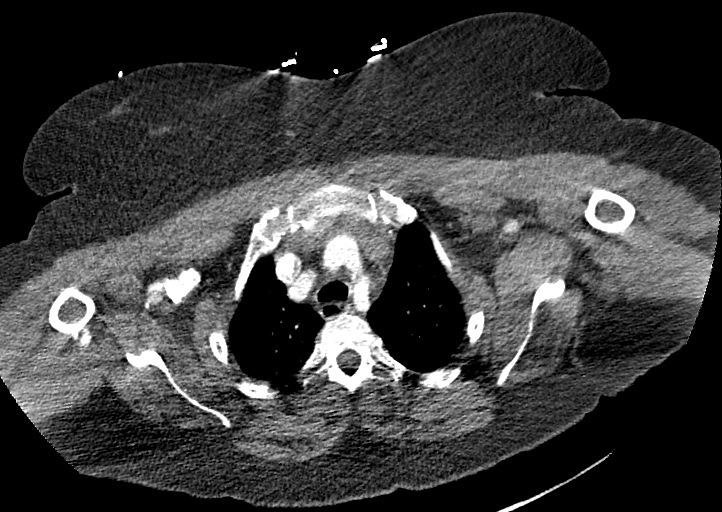
[im 124/433  bone]
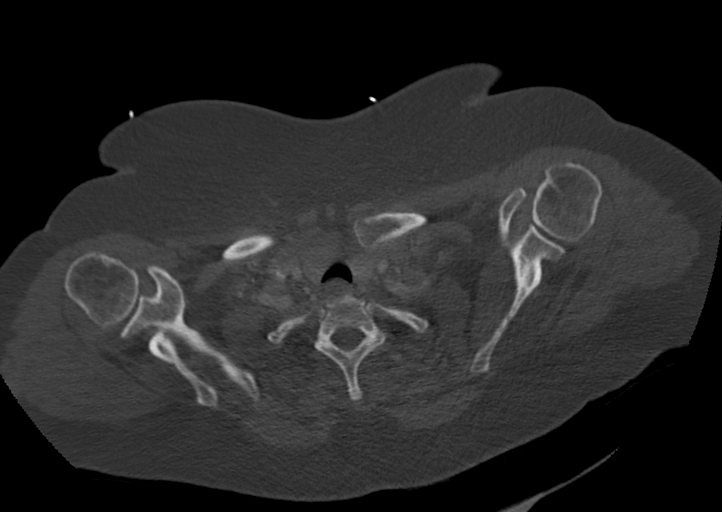
[im 186/433  soft-tissue]
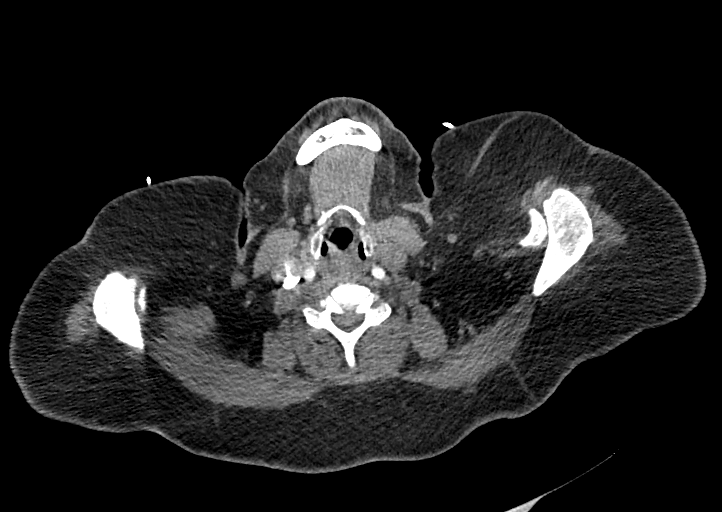
[im 247/433  bone]
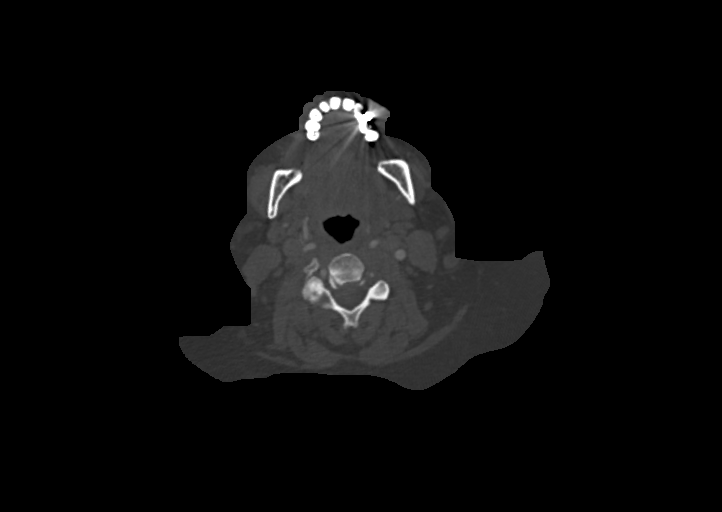
[im 309/433  soft-tissue]
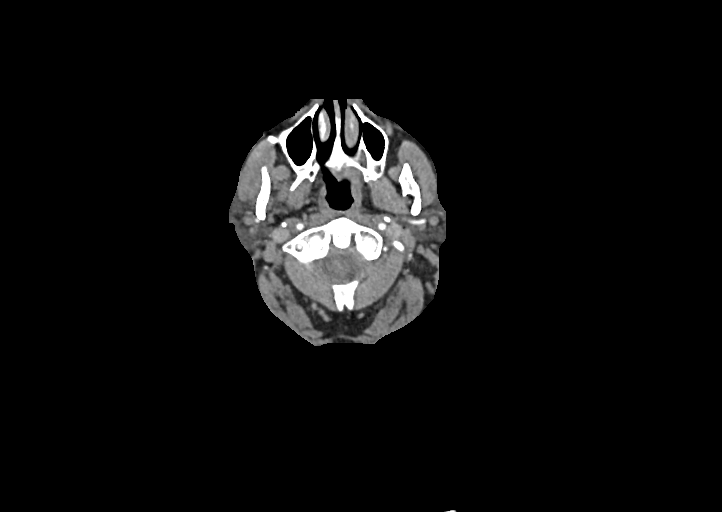
[im 371/433  bone]
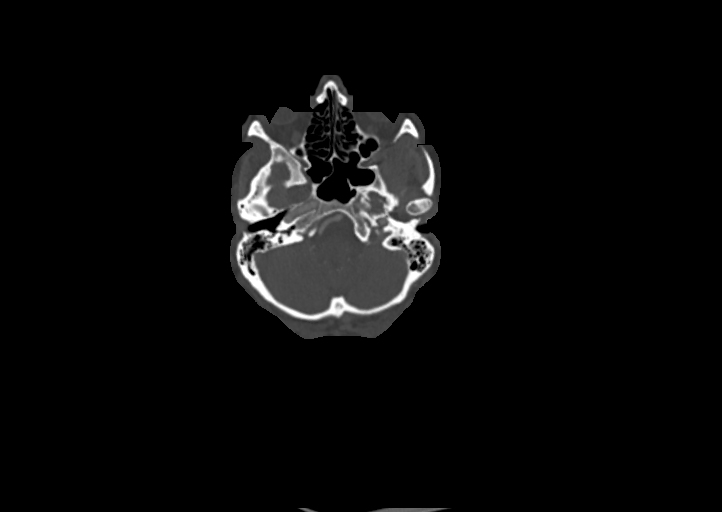

[Series 7: ax thin · axial · 0.65mm/px · z∈[-296,-224]mm · 2 of 217 slices shown]
[im 73/217  soft-tissue]
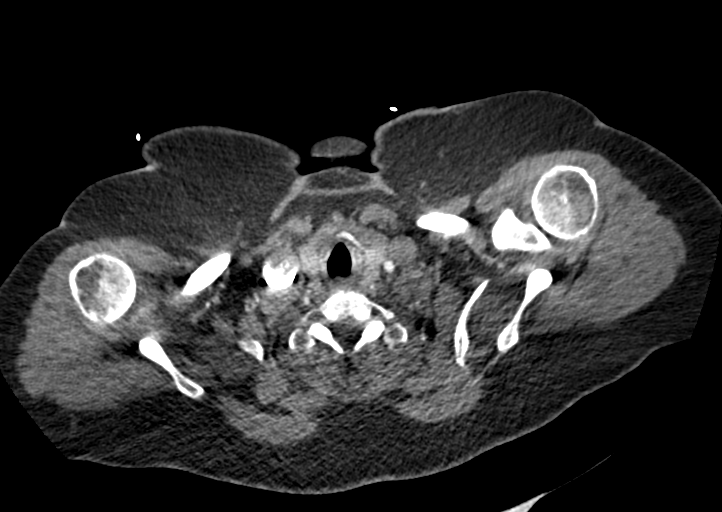
[im 145/217  soft-tissue]
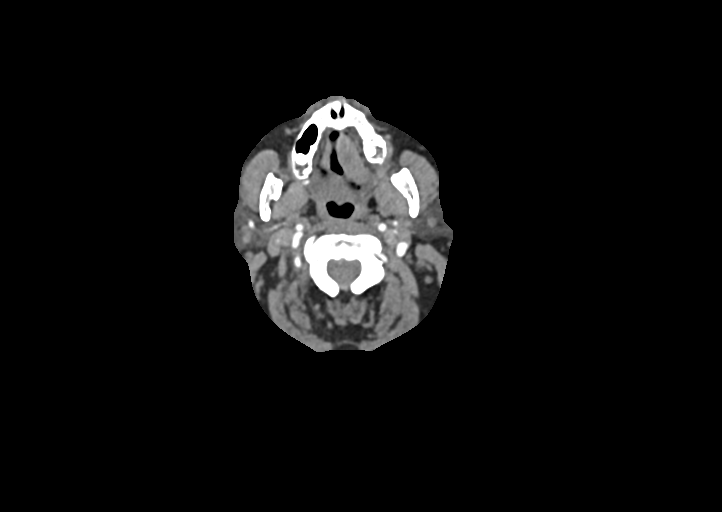

[Series 13: thin · sagittal · 0.43mm/px · 2 of 468 slices shown]
[im 105/468  soft-tissue]
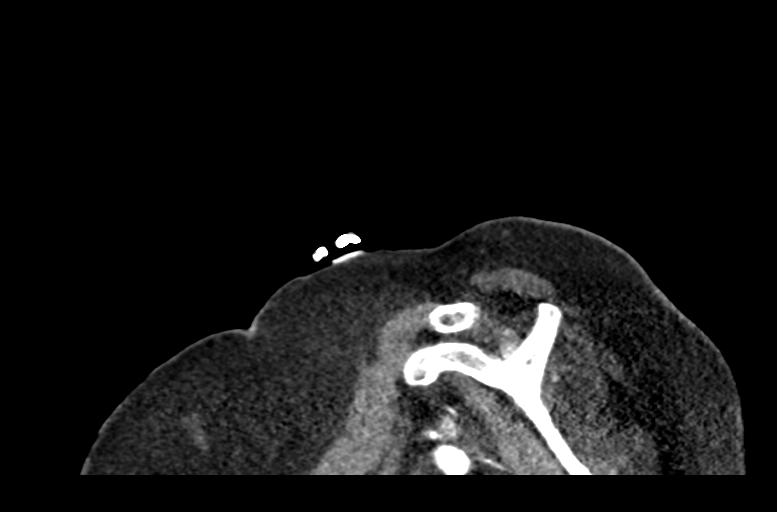
[im 364/468  soft-tissue]
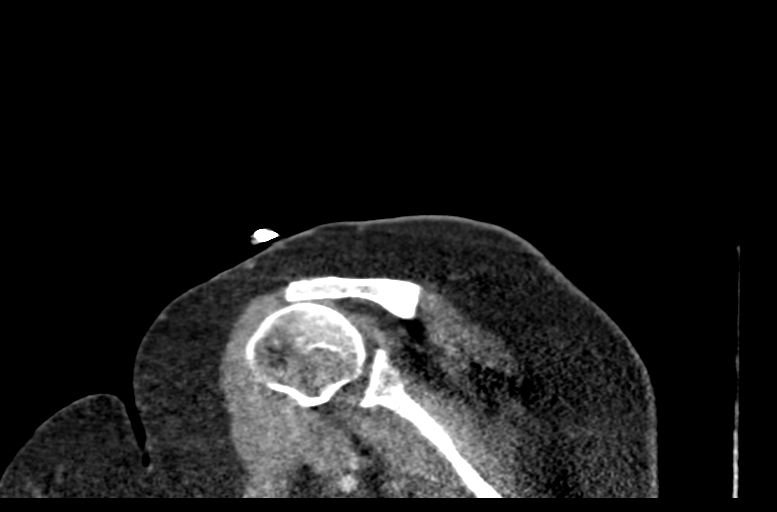

[10 of 36 positions shown; findings below may reference images not displayed]

FINDINGS: Aortic arch: Common origin of the innominate and left common carotid
arteries. The left vertebral artery arises directly from the aortic
arch. The visualized aortic arch is normal in caliber. No
hemodynamically significant innominate or proximal subclavian artery
stenosis.

Right carotid system: Streak artifact from a dense right-sided
contrast bolus somewhat limits evaluation of the proximal right
common carotid artery. Within this limitation, the CCA and ICA are
patent within the neck without stenosis or significant
atherosclerotic disease. No evidence of dissection. Tortuosity of
the cervical ICA.

Left carotid system: CCA and ICA patent within the neck without
stenosis or significant atherosclerotic disease. No evidence of a
dissection. Tortuosity of the cervical ICA

Vertebral arteries: Right vertebral artery patent within the neck
without stenosis or evidence of dissection. The left vertebral
artery is developmentally diminutive, but appears patent throughout
the neck.

Skeleton: No acute bony abnormality or aggressive osseous lesion.
Cervical spondylosis. Reversal of the expected cervical lordosis.

Other neck: No neck mass or cervical lymphadenopathy

Upper chest: Reported separately.
IMPRESSION: 1. Streak artifact from a dense right-sided contrast bolus somewhat
limits evaluation of the proximal right common carotid artery.
Within this limitation, the bilateral common carotid and internal
carotid arteries are patent within the neck without stenosis or
significant atherosclerotic disease. No evidence of dissection.
Tortuosity of the cervical ICAs bilaterally.
2. The right vertebral artery is dominant and patent throughout the
neck without stenosis or evidence of dissection.
3. The left vertebral artery is developmentally diminutive, but
appears patent throughout the neck.
4. CTA chest separately reported.

## 2022-12-24 IMAGING — CT CT ANGIO CHEST
3 of 7 series · 16 of 46 positions shown · IV contrast (APPLIED)
Comparison: Chest x-ray from earlier in the same day.

CLINICAL DATA: Left-sided chest pain for 1 week

EXAM:
CT ANGIOGRAPHY CHEST WITH CONTRAST
TECHNIQUE: Multidetector CT imaging of the chest was performed using the
standard protocol during bolus administration of intravenous
contrast. Multiplanar CT image reconstructions and MIPs were
obtained to evaluate the vascular anatomy.
CONTRAST:  150mL OMNIPAQUE IOHEXOL 350 MG/ML SOLN

[Series 6: arterial · axial · arterial · 0.79mm/px · z∈[-514,-302]mm · 11 of 128 slices shown]
[im 11/128  lung]
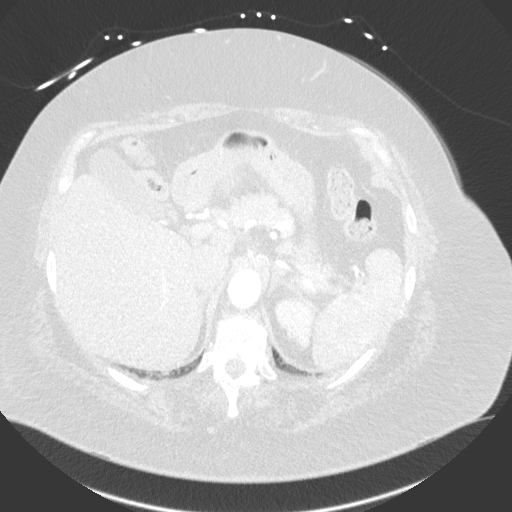
[im 22/128  soft-tissue]
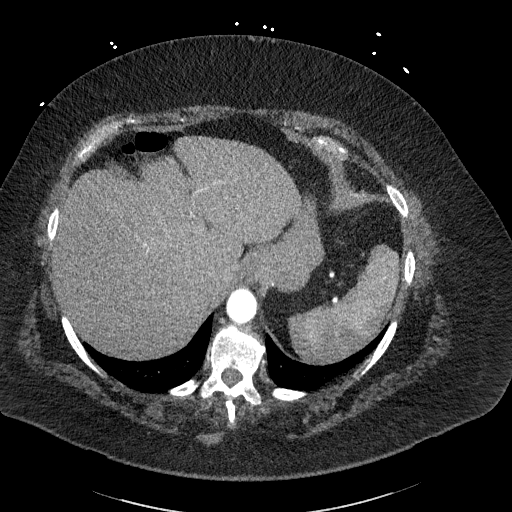
[im 32/128  lung]
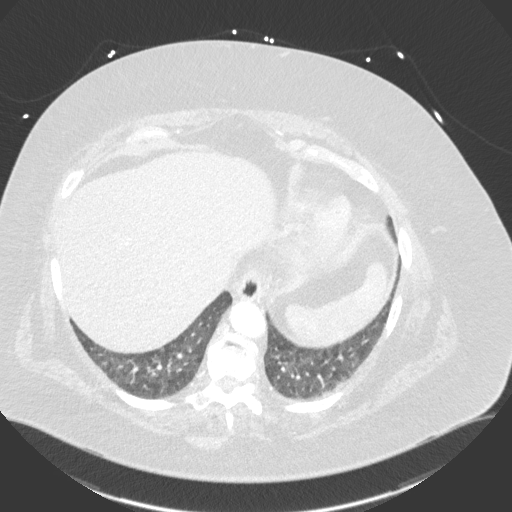
[im 43/128  soft-tissue]
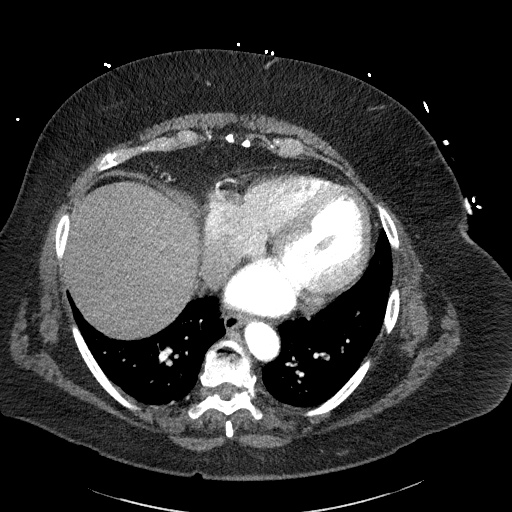
[im 53/128  lung]
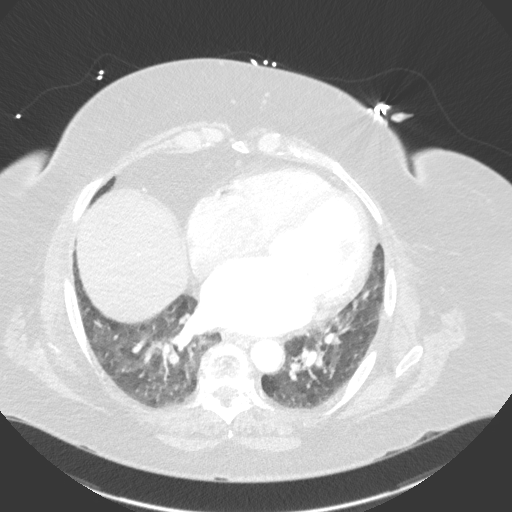
[im 64/128  soft-tissue]
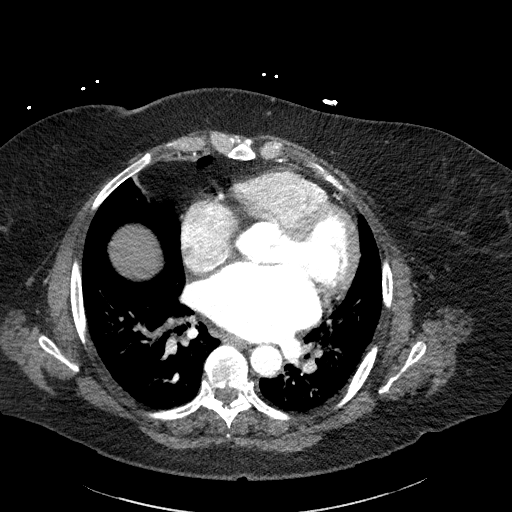
[im 75/128  lung]
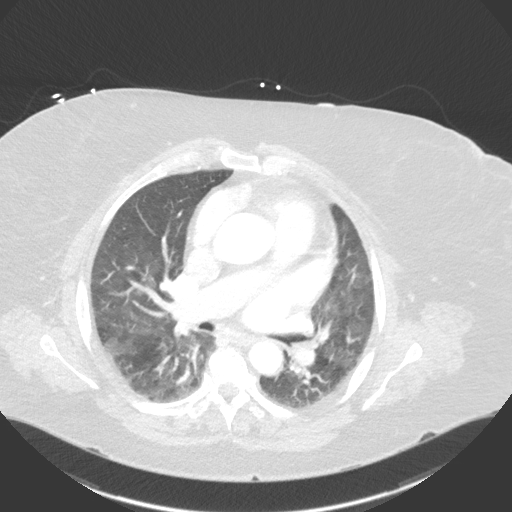
[im 85/128  soft-tissue]
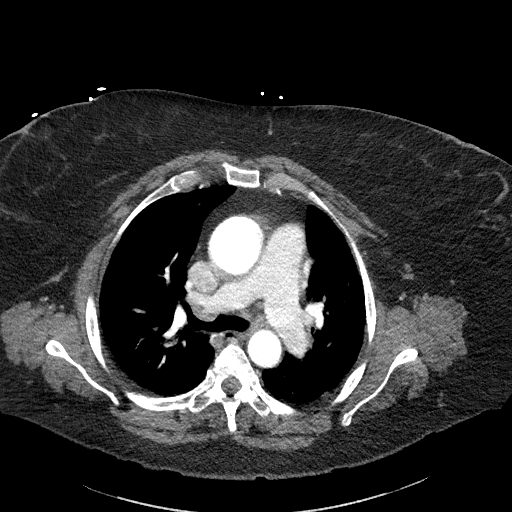
[im 96/128  lung]
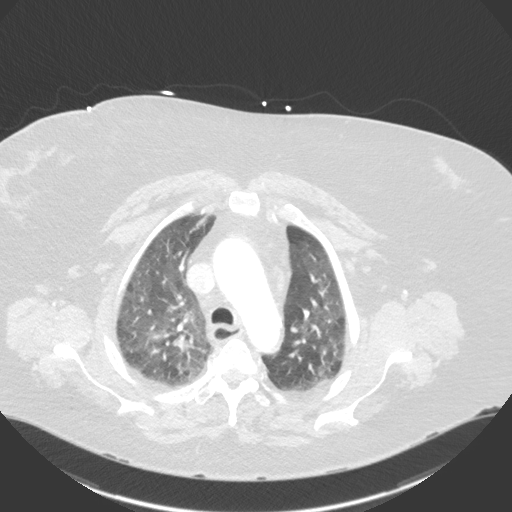
[im 106/128  soft-tissue]
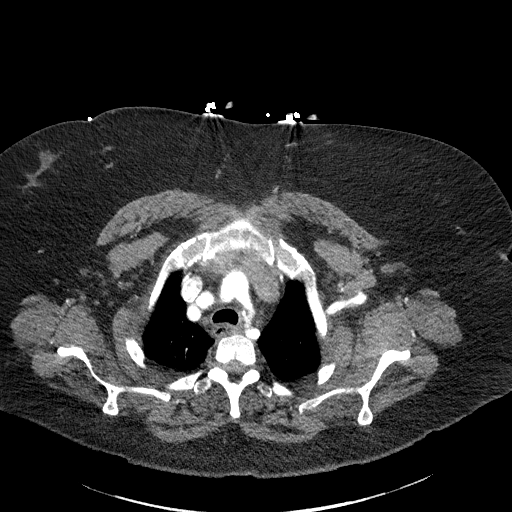
[im 117/128  lung]
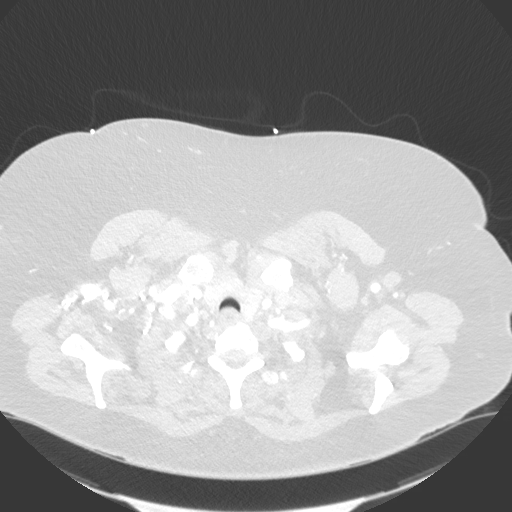

[Series 8: lung · axial · 0.79mm/px · z∈[-514,-472]mm · 2 of 128 slices shown]
[im 11/128  soft-tissue]
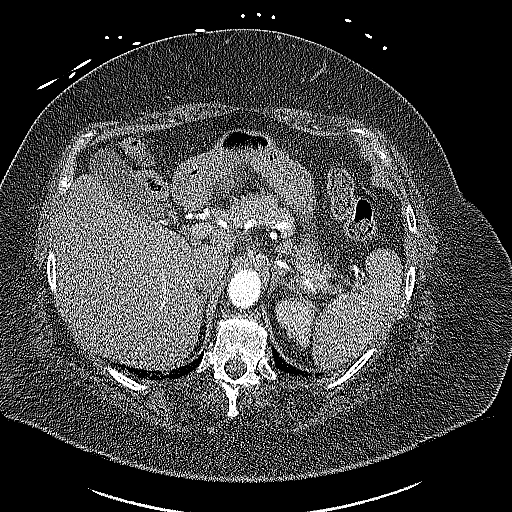
[im 32/128  soft-tissue]
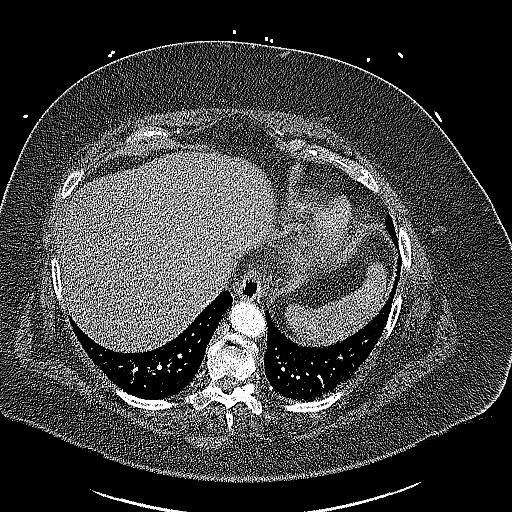

[Series 9: cor soft · coronal · 0.51mm/px · 3 of 181 slices shown]
[im 46/181  soft-tissue]
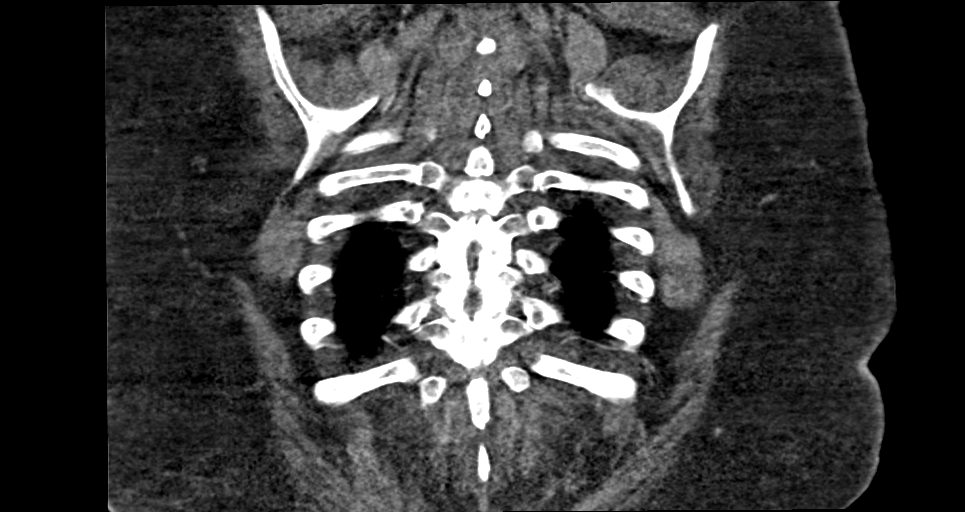
[im 91/181  soft-tissue]
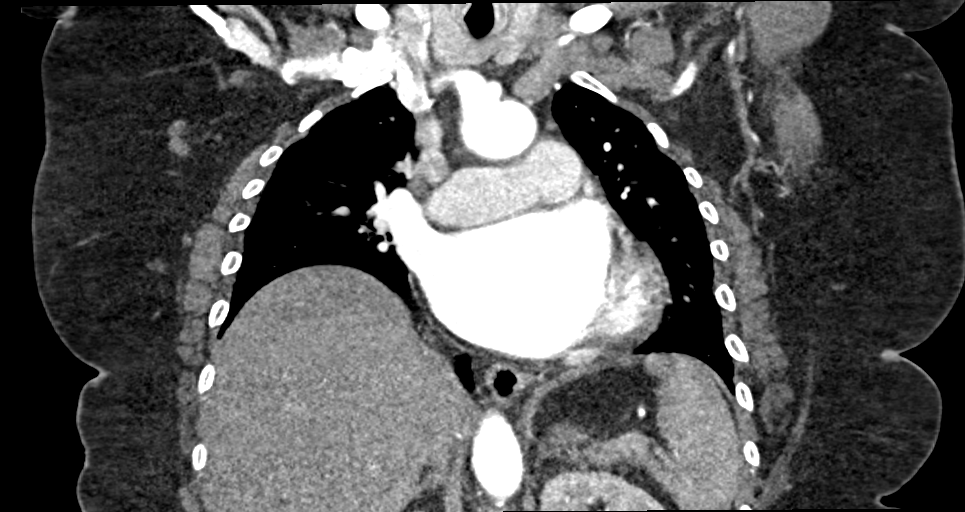
[im 136/181  soft-tissue]
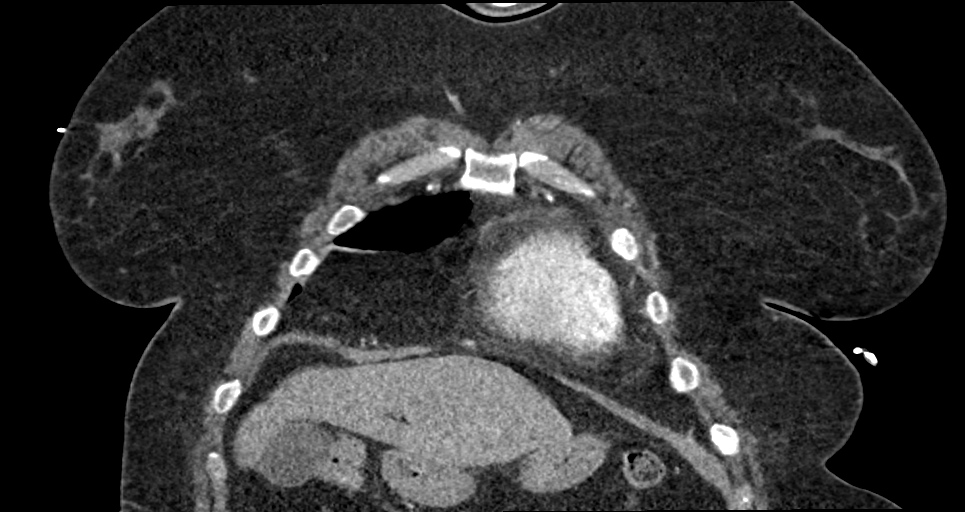

[16 of 46 positions shown; findings below may reference images not displayed]

FINDINGS: Cardiovascular: Initial precontrast image shows no hyperdense
crescent to suggest acute abnormality. Thoracic aorta demonstrates
atherosclerotic calcifications and mild dilatation of the ascending
aorta to 4.2 cm. No evidence of dissection is noted. Normal tapering
in the aortic arch is noted. Descending thoracic aorta appears
within normal limits. Pulmonary artery as visualized is grossly
unremarkable but incompletely opacified.

Mediastinum/Nodes: Thoracic inlet is within normal limits. No
sizable hilar or mediastinal adenopathy is noted. The esophagus is
within normal limits.

Lungs/Pleura: Mild dependent atelectatic changes are noted in the
lungs bilaterally. No focal confluent infiltrate is seen. No
effusion or pneumothorax is noted. No sizable parenchymal nodules
are seen.

Upper Abdomen: Visualized upper abdomen shows no acute abnormality.

Musculoskeletal: Mild degenerative changes of the thoracic spine are
noted. No acute bony abnormality is seen.

Review of the MIP images confirms the above findings.
IMPRESSION: No evidence of aortic dissection.

Mild aneurysmal dilatation to 4.2 cm in the ascending aorta.
Recommend annual imaging followup by CTA or MRA. This recommendation
follows 8535 ACCF/AHA/AATS/ACR/ASA/SCA/FERBEROVA/OPARA/EAMON/TAVOYA Guidelines
for the Diagnosis and Management of Patients with Thoracic Aortic
Disease. Circulation. 8535; 121: E266-e369. Aortic aneurysm NOS
(C3911-IKH.U)

Mild dependent atelectatic changes in the lungs bilaterally.

Aortic Atherosclerosis ([id number].N).

## 2022-12-24 NOTE — Therapy (Signed)
OUTPATIENT PHYSICAL THERAPY LOWER EXTREMITY TREATMENT   Patient Name: [name] MRN: [id number] DOB:11-Apr-1956, 67 y.o., female Today's Date: 12/24/2022  END OF SESSION:  PT End of Session - 12/24/22 1321     Visit Number 6    Number of Visits 18    Date for PT Re-Evaluation 01/28/23    Authorization Type [employer] MCR    Progress Note Due on Visit 11    PT Start Time 1315    PT Stop Time 1400    PT Time Calculation (min) 45 min    Activity Tolerance Patient tolerated treatment well    Behavior During Therapy WFL for tasks assessed/performed              Past Medical History:  Diagnosis Date   Arthritis    Atrial fibrillation (HCC)    Chronic lower back pain    Depression    DVT (deep venous thrombosis) (HCC) 1990s   LLE   Dyspnea    Fallen arches    Family history of adverse reaction to anesthesia    Mother had rash and itching after anesthesia   Headache    Hypertension    Hyperthyroidism ~ 2000   "fine now" (04/23/2016)   Joint pain    Lactose intolerance    Leg edema    Obesity    Pneumonia 1980s X 1   Snoring    has not had sleep study but suspects sleep apnea   Past Surgical History:  Procedure Laterality Date   BIOPSY  08/09/2021   Procedure: BIOPSY;  Surgeon: Felicie Morn, MD;  Location: [facility];  Service: General;;   CARDIOVERSION N/A 06/18/2016   Procedure: CARDIOVERSION;  Surgeon: Larey Dresser, MD;  Location: [facility];  Service: Cardiovascular;  Laterality: N/A;   CARDIOVERSION N/A 07/11/2016   Procedure: CARDIOVERSION;  Surgeon: Lelon Perla, MD;  Location: [facility];  Service: Cardiovascular;  Laterality: N/A;   CARDIOVERSION N/A 07/02/2017   Procedure: CARDIOVERSION;  Surgeon: Sanda Klein, MD;  Location: [facility];  Service: Cardiovascular;  Laterality: N/A;   CARDIOVERSION N/A 09/29/2017   Procedure: CARDIOVERSION;  Surgeon: Larey Dresser, MD;  Location: [facility] ENDOSCOPY;  Service: Cardiovascular;  Laterality:  N/A;   ENDOMETRIAL ABLATION  ~2006   ESOPHAGOGASTRODUODENOSCOPY N/A 08/09/2021   Procedure: ESOPHAGOGASTRODUODENOSCOPY (EGD);  Surgeon: Felicie Morn, MD;  Location: [facility];  Service: General;  Laterality: N/A;   HIATAL HERNIA REPAIR N/A 10/08/2021   Procedure: HERNIA REPAIR HIATAL;  Surgeon: Clovis Riley, MD;  Location: [facility];  Service: General;  Laterality: N/A;   IR GUIDED DRAIN W CATHETER PLACEMENT  10/18/2021   LAPAROSCOPIC GASTRIC SLEEVE RESECTION N/A 10/08/2021   Procedure: LAPAROSCOPIC GASTRIC SLEEVE RESECTION;  Surgeon: Clovis Riley, MD;  Location: [facility];  Service: General;  Laterality: N/A;   TONSILLECTOMY  1960s   TUBAL LIGATION  1979   UPPER GI ENDOSCOPY N/A 10/08/2021   Procedure: UPPER GI ENDOSCOPY;  Surgeon: Clovis Riley, MD;  Location: [facility];  Service: General;  Laterality: N/A;   Patient Active Problem List   Diagnosis Date Noted   Postoperative hematoma of subcutaneous tissue following non-dermatologic procedure 10/17/2021   Insulin resistance 05/11/2020   DOE (dyspnea on exertion) 02/09/2020   Depression 09/26/2019   Vitamin D deficiency 09/01/2019   Class 3 severe obesity with serious comorbidity and body mass index (BMI) of 50.0 to 59.9 in adult [facility]) 09/01/2019   Prediabetes 08/15/2019   Atrial fibrillation ([place])  Chest pain 04/22/2016   Hypokalemia 04/22/2016   Morbid obesity ([place]) 04/22/2016   Hypertension 04/22/2016   Paroxysmal A-fib ([place]) 04/22/2016   Hypomagnesemia 04/22/2016    PCP: Harlan Stains MD   REFERRING PROVIDER: Harlan Stains   REFERRING DIAG:R29.6 (ICD-10-CM) - Repeated falls   THERAPY DIAG:  Difficulty in walking, not elsewhere classified  Muscle weakness (generalized)  Repeated falls  Stiffness of left foot, not elsewhere classified  Rationale for Evaluation and Treatment: Rehabilitation  ONSET DATE: balance has become worse over the past few months  SUBJECTIVE:   SUBJECTIVE STATEMENT:Pt  reports she feels real good after aquatic therapy. Slight pain left shoulder  PERTINENT HISTORY: Arthritis, A-fib, DVT. Depression, Dyspnea, headaches, LE edema,  Obesity; gastric bypass,   PAIN:   Are you in pain:  yes NPRS:  3/10 Location: Lt shoulder / arm  Description: achy  PRECAUTIONS: Fall  WEIGHT BEARING RESTRICTIONS: No  FALLS:  Has patient fallen in last 6 months? Yes. Number of falls 3  Last fall: fell backwards int he shower  LIVING ENVIRONMENT: Ramp into the house  OCCUPATION:  On disability   Hobbies:  Traveling   PLOF: Independent and Independent with community mobility with device  PATIENT GOALS:  To have less falls   NEXT MD VISIT: every 2-3 months   OBJECTIVE:   DIAGNOSTIC FINDINGS:  Nothing recent   PATIENT SURVEYS:  FOTO 42 at eval 6th visit 12/24/22:   COGNITION: Overall cognitive status: Within functional limits for tasks assessed     SENSATION: WFL  EDEMA:  Past history of edema but no significant edema at this time   MUSCLE LENGTH:  POSTURE: No Significant postural limitations  PALPATION: No significant tenderness to palpation   LOWER EXTREMITY ROM:  Passive ROM Right eval Left eval  Hip flexion    Hip extension    Hip abduction    Hip adduction    Hip internal rotation    Hip external rotation    Knee flexion    Knee extension    Ankle dorsiflexion    Ankle plantarflexion    Ankle inversion    Ankle eversion     (Blank rows = not tested)  LOWER EXTREMITY MMT:  MMT Right eval Left eval Right / Left  Hip flexion 27.4 27.1 43.2 / 36.0  Hip extension     Hip abduction 26.5 30.4 25.7 / 23.6  Hip adduction     Hip internal rotation     Hip external rotation     Knee flexion     Knee extension 23.0 25.2 38.0 / 31.7  Ankle dorsiflexion     Ankle plantarflexion     Ankle inversion     Ankle eversion      (Blank rows = not tested)    FUNCTIONAL TESTS:  Berg Balance Scale:  BERG Balance Test           Date:    12/03/22  Sit to Stand 3 3  Standing unsupported 3 3  Sitting with back unsupported but feet supported 4 4  Stand to sit  3 3  Transfers  4 3  Standing unsupported with eyes closed 4 3  Standing unsupported feet together 4 3  From standing position, reach forward with outstretched arm 3 2  From standing position, pick up object from floor 4 3  From standing position, turn and look behind over each shoulder 4 3  Turn 360 2 2  Standing unsupported, alternately place foot on step  3 2  Standing unsupported, one foot in front 0 0  Standing on one leg 0 0  Total:  41 34/56    12/03/22 5X STS test: 22.19 from armed chair with use of UE TUG: 18.99 using cane  12/24/20 TUG:14s without AD  GAIT: The patient comes in today using her cane on the left side. The left side is where her foot issue is. She is now having some discomort in her left shoulder. We reviewed proper use of the cane.   TODAY'S TREATMENT:                                                                                                                              Pt seen for aquatic therapy today.  Treatment took place in water 3.5-4 ft in depth at the [facility]. Temp of water was 91.  Pt entered/exited the pool via stairs in step-to pattern independently with bilat rail.  * unsupported: walking forward/backward and side stepping * tandem gait forward/backward without support  * Standing 4.5 ft shoulders submerged: reciprocal arms swing, shoulder circles cw and CCW; add/abd; horizontal add/abd; flex/ext * holding rainbow hand floats: 3 way toe tap x 10 each LE; hip flex/ext R/L x10 each; Hip abdct/ addct x 10 each *ue support wall: mini-squats 2 x 10 *1/2 noodle pull down staggered stance then wide stance R/L x10 *QL strengthening using 1 foam hand buoy: side bending R/L 2 x 10 *STS from bench onto blue step ue supported on kick board x 12   Pt requires the buoyancy and hydrostatic pressure of water  for support, and to offload joints by unweighting joint load by at least 50 % in navel deep water and by at least 75-80% in chest to neck deep water.  Viscosity of the water is needed for resistance of strengthening. Water current perturbations provides challenge to standing balance requiring increased core activation.  Foto completed TUG completed  PATIENT EDUCATION:  Education details: aquatic rehab review and progressions Person educated: Patient Education method: Explanation, Demonstration, Tactile cues, and Verbal cues Education comprehension: verbalized understanding, returned demonstration, verbal cues required, tactile cues required, and needs further education  HOME EXERCISE PROGRAM: TBA  ASSESSMENT:  CLINICAL IMPRESSION: Progressed core strengthening adding QL firing. Tolerated well.  Foto score completed today unchanged although good improvement on TUG.  She has had no falls or near falls.  Reports little lue pain but no back pain.  Progressing well but slowly.  Goals ongoing    OBJECTIVE IMPAIRMENTS: Abnormal gait, decreased activity tolerance, decreased balance, decreased endurance, decreased mobility, difficulty walking, decreased ROM, and decreased strength.   ACTIVITY LIMITATIONS: lifting, bending, standing, squatting, stairs, and locomotion level  PARTICIPATION LIMITATIONS: meal prep, cleaning, laundry, driving, shopping, occupation, and yard work  PERSONAL FACTORS: 1-2 comorbidities: left foot surgery; low back pain  are also affecting patient's functional outcome.   REHAB POTENTIAL: Good  CLINICAL DECISION MAKING: Evolving/moderate complexity  EVALUATION  COMPLEXITY: Low   GOALS: Goals reviewed with patient? Yes  SHORT TERM GOALS: Target date: 12/31/2022   Patient will be independent with basic balance and strength HEP  Baseline: Goal status: INITIAL  2.  Patient will increase BERG score by 4 points  Baseline: 33/56 Goal status: INITIAL  3.  Patient  will increase gross bilateral LE strength by 5 lbs  Baseline: see updated chart Goal status: INITIAL   LONG TERM GOALS: Target date: 01/28/24/2024    Patient will have no falls over a 2 weeks period prior to discharge  Baseline: multiple Goal status: INITIAL  2.  Patient will ambulate 3000' without loss of balance or pain in her left shoulder  Baseline:  Goal status: INITIAL  3.  Patient will have complete balance and strengthening plan  Baseline:  Goal status: INITIAL   4. Pt will improve on Tug to < 13s to demonstrate improved mobility   Baseline: 18.99 Goal Status: NEW  5. Pt will improve on 5 X STS test to <15s to demonstrate improved strength  Baseline: 22.19 from armed chair with use of ue PLAN:  PT FREQUENCY: 1-2x/week  PT DURATION: 8 weeks (allowing for potential scheduling difficulties)  PLANNED INTERVENTIONS: Therapeutic exercises, Therapeutic activity, Neuromuscular re-education, Balance training, Gait training, Patient/Family education, Self Care, Joint mobilization, DME instructions, Aquatic Therapy, Dry Needling, Electrical stimulation, Cryotherapy, Moist heat, and Manual therapy  PLAN FOR NEXT SESSION:  Patient has had success in the pool before.  Will start her out with pool balance exercises and gait training.  Will progress to land. Plan first 4-6 visits aquatic then alternate with land sessions.  [address] [place]) Rolland Steinert MPT 12/24/22 1:26 PM [facility] [address] [place], [place], [address] Phone: [phone]   Fax:  [phone]

## 2022-12-29 ENCOUNTER — Encounter (HOSPITAL_BASED_OUTPATIENT_CLINIC_OR_DEPARTMENT_OTHER): Payer: Self-pay | Admitting: Physical Therapy

## 2022-12-29 ENCOUNTER — Ambulatory Visit (HOSPITAL_BASED_OUTPATIENT_CLINIC_OR_DEPARTMENT_OTHER): Payer: Medicare Other | Attending: Family Medicine | Admitting: Physical Therapy

## 2022-12-29 DIAGNOSIS — M6281 Muscle weakness (generalized): Secondary | ICD-10-CM | POA: Diagnosis not present

## 2022-12-29 DIAGNOSIS — R262 Difficulty in walking, not elsewhere classified: Secondary | ICD-10-CM | POA: Insufficient documentation

## 2022-12-29 DIAGNOSIS — R296 Repeated falls: Secondary | ICD-10-CM | POA: Diagnosis not present

## 2022-12-29 NOTE — Therapy (Signed)
OUTPATIENT PHYSICAL THERAPY LOWER EXTREMITY TREATMENT   Patient Name: [name] MRN: [id number] DOB:Apr 24, 1956, 67 y.o., female Today's Date: 12/29/2022  END OF SESSION:  PT End of Session - 12/29/22 1038     Visit Number 7    Number of Visits 18    Date for PT Re-Evaluation 01/28/23    Authorization Type U[employer] MCR    Progress Note Due on Visit 11    PT Start Time 1033    PT Stop Time 1111    PT Time Calculation (min) 38 min    Behavior During Therapy WFL for tasks assessed/performed              Past Medical History:  Diagnosis Date   Arthritis    Atrial fibrillation (HCC)    Chronic lower back pain    Depression    DVT (deep venous thrombosis) (HCC) 1990s   LLE   Dyspnea    Fallen arches    Family history of adverse reaction to anesthesia    Mother had rash and itching after anesthesia   Headache    Hypertension    Hyperthyroidism ~ 2000   "fine now" (04/23/2016)   Joint pain    Lactose intolerance    Leg edema    Obesity    Pneumonia 1980s X 1   Snoring    has not had sleep study but suspects sleep apnea   Past Surgical History:  Procedure Laterality Date   BIOPSY  08/09/2021   Procedure: BIOPSY;  Surgeon: Felicie Morn, MD;  Location: [facility];  Service: General;;   CARDIOVERSION N/A 06/18/2016   Procedure: CARDIOVERSION;  Surgeon: Larey Dresser, MD;  Location: [place];  Service: Cardiovascular;  Laterality: N/A;   CARDIOVERSION N/A 07/11/2016   Procedure: CARDIOVERSION;  Surgeon: Lelon Perla, MD;  Location: [facility] ENDOSCOPY;  Service: Cardiovascular;  Laterality: N/A;   CARDIOVERSION N/A 07/02/2017   Procedure: CARDIOVERSION;  Surgeon: Sanda Klein, MD;  Location: [place] [facility]OSCOPY;  Service: Cardiovascular;  Laterality: N/A;   CARDIOVERSION N/A 09/29/2017   Procedure: CARDIOVERSION;  Surgeon: Larey Dresser, MD;  Location: [facility] ENDOSCOPY;  Service: Cardiovascular;  Laterality: N/A;   ENDOMETRIAL ABLATION  ~2006    ESOPHAGOGASTRODUODENOSCOPY N/A 08/09/2021   Procedure: ESOPHAGOGASTRODUODENOSCOPY (EGD);  Surgeon: Felicie Morn, MD;  Location: [facility];  Service: General;  Laterality: N/A;   HIATAL HERNIA REPAIR N/A 10/08/2021   Procedure: HERNIA REPAIR HIATAL;  Surgeon: Clovis Riley, MD;  Location: [facility];  Service: General;  Laterality: N/A;   IR GUIDED DRAIN W CATHETER PLACEMENT  10/18/2021   LAPAROSCOPIC GASTRIC SLEEVE RESECTION N/A 10/08/2021   Procedure: LAPAROSCOPIC GASTRIC SLEEVE RESECTION;  Surgeon: Clovis Riley, MD;  Location: [facility];  Service: General;  Laterality: N/A;   TONSILLECTOMY  1960s   TUBAL LIGATION  1979   UPPER GI ENDOSCOPY N/A 10/08/2021   Procedure: UPPER GI ENDOSCOPY;  Surgeon: Clovis Riley, MD;  Location: [facility];  Service: General;  Laterality: N/A;   Patient Active Problem List   Diagnosis Date Noted   Postoperative hematoma of subcutaneous tissue following non-dermatologic procedure 10/17/2021   Insulin resistance 05/11/2020   DOE (dyspnea on exertion) 02/09/2020   Depression 09/26/2019   Vitamin D deficiency 09/01/2019   Class 3 severe obesity with serious comorbidity and body mass index (BMI) of 50.0 to 59.9 in adult [facility]) 09/01/2019   Prediabetes 08/15/2019   Atrial fibrillation ([place])    Chest pain 04/22/2016   Hypokalemia 04/22/2016  Morbid obesity ([place]) 04/22/2016   Hypertension 04/22/2016   Paroxysmal A-fib ([place]) 04/22/2016   Hypomagnesemia 04/22/2016    PCP: Harlan Stains MD   REFERRING PROVIDER: Harlan Stains   REFERRING DIAG:R29.6 (ICD-10-CM) - Repeated falls   THERAPY DIAG:  No diagnosis found.  Rationale for Evaluation and Treatment: Rehabilitation  ONSET DATE: balance has become worse over the past few months  SUBJECTIVE:   SUBJECTIVE STATEMENT:Pt reports she feels real good after aquatic therapy.  "I've been trying to concentrate on bringing in my core".   PERTINENT HISTORY: Arthritis, A-fib, DVT. Depression,  Dyspnea, headaches, LE edema,  Obesity; gastric bypass,   PAIN:   Are you in pain:  yes NPRS:  5/10 Location: Lt shoulder / arm; Rt knee   Description: achy  PRECAUTIONS: Fall  WEIGHT BEARING RESTRICTIONS: No  FALLS:  Has patient fallen in last 6 months? Yes. Number of falls 3  Last fall: fell backwards int he shower  LIVING ENVIRONMENT: Ramp into the house  OCCUPATION:  On disability   Hobbies:  Traveling   PLOF: Independent and Independent with community mobility with device  PATIENT GOALS:  To have less falls   NEXT MD VISIT: every 2-3 months   OBJECTIVE:   DIAGNOSTIC FINDINGS:  Nothing recent   PATIENT SURVEYS:  FOTO 42 at eval 6th visit 12/24/22:   COGNITION: Overall cognitive status: Within functional limits for tasks assessed     SENSATION: WFL  EDEMA:  Past history of edema but no significant edema at this time   MUSCLE LENGTH:  POSTURE: No Significant postural limitations  PALPATION: No significant tenderness to palpation   LOWER EXTREMITY ROM:  Passive ROM Right eval Left eval  Hip flexion    Hip extension    Hip abduction    Hip adduction    Hip internal rotation    Hip external rotation    Knee flexion    Knee extension    Ankle dorsiflexion    Ankle plantarflexion    Ankle inversion    Ankle eversion     (Blank rows = not tested)  LOWER EXTREMITY MMT:  MMT Right eval Left eval Right / Left  Hip flexion 27.4 27.1 43.2 / 36.0  Hip extension     Hip abduction 26.5 30.4 25.7 / 23.6  Hip adduction     Hip internal rotation     Hip external rotation     Knee flexion     Knee extension 23.0 25.2 38.0 / 31.7  Ankle dorsiflexion     Ankle plantarflexion     Ankle inversion     Ankle eversion      (Blank rows = not tested)    FUNCTIONAL TESTS:  Berg Balance Scale:  BERG Balance Test          Eval   12/03/22  Sit to Stand 3 3  Standing unsupported 3 3  Sitting with back unsupported but feet supported 4 4  Stand to  sit  3 3  Transfers  4 3  Standing unsupported with eyes closed 4 3  Standing unsupported feet together 4 3  From standing position, reach forward with outstretched arm 3 2  From standing position, pick up object from floor 4 3  From standing position, turn and look behind over each shoulder 4 3  Turn 360 2 2  Standing unsupported, alternately place foot on step 3 2  Standing unsupported, one foot in front 0 0  Standing on one leg 0  0  Total:  41 34/56    12/03/22 5X STS test: 22.19 from armed chair with use of UE TUG: 18.99 using cane  12/24/20 TUG:14s without AD  GAIT: The patient comes in today using her cane on the left side. The left side is where her foot issue is. She is now having some discomort in her left shoulder. We reviewed proper use of the cane.   TODAY'S TREATMENT:                                                                                                                              Pt seen for aquatic therapy today.  Treatment took place in water 3.5-4 ft in depth at the [facility]. Temp of water was 91.  Pt entered/exited the pool via stairs in step-to pattern independently with bilat rail.  * unsupported: walking forward/backward and side stepping * side stepping with rainbow hand floats shoulder addct x 2 laps * holding rainbow hand floats SLS x 15 each LE * tandem gait forward/backward without support  * holding solid noodle:  3 way LE kick alternating LEs.  * 360 turns;  tandem stance x 15s x 2 each;  alternating toe taps to blue step without UE support *STS from bench onto blue step without UE support x 12 * staggered stance with kick board row x 10 x 2 (varying speed) * TrA set with 1/2 noodle pull down x 10 * marching forward/backward with arm swing * hip openers without UE support    Pt requires the buoyancy and hydrostatic pressure of water for support, and to offload joints by unweighting joint load by at least 50 % in navel deep  water and by at least 75-80% in chest to neck deep water.  Viscosity of the water is needed for resistance of strengthening. Water current perturbations provides challenge to standing balance requiring increased core activation.  PATIENT EDUCATION:  Education details: aquatic rehab review and progressions Person educated: Patient Education method: Consulting civil engineer, Demonstration, Tactile cues, and Verbal cues Education comprehension: verbalized understanding, returned demonstration, verbal cues required, tactile cues required, and needs further education  HOME EXERCISE PROGRAM: TBA  ASSESSMENT:  CLINICAL IMPRESSION: Pt tolerating aquatic exercises well, reporting reduction of Rt knee and Lt shoulder pain.  Added Berg items to session today with mild challenge.  Encouraged pt to trial SLS and tandem stance at counter at home, for improvements of Berg goal.  Progressing well but slowly.  Goals ongoing.    Add Rt hip stretches (adductor) next visit.     OBJECTIVE IMPAIRMENTS: Abnormal gait, decreased activity tolerance, decreased balance, decreased endurance, decreased mobility, difficulty walking, decreased ROM, and decreased strength.   ACTIVITY LIMITATIONS: lifting, bending, standing, squatting, stairs, and locomotion level  PARTICIPATION LIMITATIONS: meal prep, cleaning, laundry, driving, shopping, occupation, and yard work  PERSONAL FACTORS: 1-2 comorbidities: left foot surgery; low back pain  are also affecting patient's functional outcome.   REHAB  POTENTIAL: Good  CLINICAL DECISION MAKING: Evolving/moderate complexity  EVALUATION COMPLEXITY: Low   GOALS: Goals reviewed with patient? Yes  SHORT TERM GOALS: Target date: 12/31/2022   Patient will be independent with basic balance and strength HEP  Baseline: Goal status: INITIAL  2.  Patient will increase BERG score by 4 points  Baseline: 33/56 Goal status: INITIAL  3.  Patient will increase gross bilateral LE strength by 5  lbs  Baseline: see updated chart Goal status: INITIAL   LONG TERM GOALS: Target date: 01/28/24/2024    Patient will have no falls over a 2 weeks period prior to discharge  Baseline: multiple Goal status: INITIAL  2.  Patient will ambulate 3000' without loss of balance or pain in her left shoulder  Baseline:  Goal status: INITIAL  3.  Patient will have complete balance and strengthening plan  Baseline:  Goal status: INITIAL   4. Pt will improve on Tug to < 13s to demonstrate improved mobility   Baseline: 18.99 Goal Status: NEW  5. Pt will improve on 5 X STS test to <15s to demonstrate improved strength  Baseline: 22.19 from armed chair with use of ue PLAN:  PT FREQUENCY: 1-2x/week  PT DURATION: 8 weeks (allowing for potential scheduling difficulties)  PLANNED INTERVENTIONS: Therapeutic exercises, Therapeutic activity, Neuromuscular re-education, Balance training, Gait training, Patient/Family education, Self Care, Joint mobilization, DME instructions, Aquatic Therapy, Dry Needling, Electrical stimulation, Cryotherapy, Moist heat, and Manual therapy  PLAN FOR NEXT SESSION:  Patient has had success in the pool before.  Will start her out with pool balance exercises and gait training.  Will progress to land. Plan first 4-6 visits aquatic then alternate with land sessions.  Kerin Perna, PTA 12/29/22 11:21 AM [facility] [place], [place], [address] Phone: [phone]   Fax:  [phone]

## 2022-12-30 DIAGNOSIS — Z981 Arthrodesis status: Secondary | ICD-10-CM | POA: Diagnosis not present

## 2022-12-30 DIAGNOSIS — M79672 Pain in left foot: Secondary | ICD-10-CM | POA: Diagnosis not present

## 2022-12-30 DIAGNOSIS — M19071 Primary osteoarthritis, right ankle and foot: Secondary | ICD-10-CM | POA: Diagnosis not present

## 2022-12-30 DIAGNOSIS — M19072 Primary osteoarthritis, left ankle and foot: Secondary | ICD-10-CM | POA: Diagnosis not present

## 2022-12-30 DIAGNOSIS — M00072 Staphylococcal arthritis, left ankle and foot: Secondary | ICD-10-CM | POA: Diagnosis not present

## 2022-12-30 DIAGNOSIS — I724 Aneurysm of artery of lower extremity: Secondary | ICD-10-CM | POA: Diagnosis not present

## 2022-12-30 DIAGNOSIS — M79671 Pain in right foot: Secondary | ICD-10-CM | POA: Diagnosis not present

## 2022-12-30 DIAGNOSIS — Q666 Other congenital valgus deformities of feet: Secondary | ICD-10-CM | POA: Diagnosis not present

## 2022-12-30 DIAGNOSIS — Q661 Congenital talipes calcaneovarus, unspecified foot: Secondary | ICD-10-CM | POA: Diagnosis not present

## 2022-12-31 ENCOUNTER — Ambulatory Visit (HOSPITAL_BASED_OUTPATIENT_CLINIC_OR_DEPARTMENT_OTHER): Payer: Medicare Other | Admitting: Physical Therapy

## 2022-12-31 DIAGNOSIS — R296 Repeated falls: Secondary | ICD-10-CM

## 2022-12-31 DIAGNOSIS — M6281 Muscle weakness (generalized): Secondary | ICD-10-CM

## 2022-12-31 DIAGNOSIS — R262 Difficulty in walking, not elsewhere classified: Secondary | ICD-10-CM

## 2022-12-31 NOTE — Therapy (Signed)
OUTPATIENT PHYSICAL THERAPY LOWER EXTREMITY TREATMENT   Patient Name: [name] MRN: [id number] DOB:1956-06-16, 67 y.o., female Today's Date: 12/31/2022  END OF SESSION:  PT End of Session - 12/31/22 1332     Visit Number 8    Number of Visits 18    Date for PT Re-Evaluation 01/28/23    Authorization Type UHC MCR    Progress Note Due on Visit 11    PT Start Time 1201    PT Stop Time 1240    PT Time Calculation (min) 39 min    Activity Tolerance Patient tolerated treatment well    Behavior During Therapy WFL for tasks assessed/performed               Past Medical History:  Diagnosis Date   Arthritis    Atrial fibrillation (HCC)    Chronic lower back pain    Depression    DVT (deep venous thrombosis) (HCC) 1990s   LLE   Dyspnea    Fallen arches    Family history of adverse reaction to anesthesia    Mother had rash and itching after anesthesia   Headache    Hypertension    Hyperthyroidism ~ 2000   "fine now" (04/23/2016)   Joint pain    Lactose intolerance    Leg edema    Obesity    Pneumonia 1980s X 1   Snoring    has not had sleep study but suspects sleep apnea   Past Surgical History:  Procedure Laterality Date   BIOPSY  08/09/2021   Procedure: BIOPSY;  Surgeon: Felicie Morn, MD;  Location: [facility];  Service: General;;   CARDIOVERSION N/A 06/18/2016   Procedure: CARDIOVERSION;  Surgeon: Larey Dresser, MD;  Location: [employer];  Service: Cardiovascular;  Laterality: N/A;   CARDIOVERSION N/A 07/11/2016   Procedure: CARDIOVERSION;  Surgeon: Lelon Perla, MD;  Location: [facility] ENDOSCOPY;  Service: Cardiovascular;  Laterality: N/A;   CARDIOVERSION N/A 07/02/2017   Procedure: CARDIOVERSION;  Surgeon: Sanda Klein, MD;  Location: [place] ENDOSCOPY;  Service: Cardiovascular;  Laterality: N/A;   CARDIOVERSION N/A 09/29/2017   Procedure: CARDIOVERSION;  Surgeon: Larey Dresser, MD;  Location: [facility] ENDOSCOPY;  Service: Cardiovascular;  Laterality:  N/A;   ENDOMETRIAL ABLATION  ~2006   ESOPHAGOGASTRODUODENOSCOPY N/A 08/09/2021   Procedure: ESOPHAGOGASTRODUODENOSCOPY (EGD);  Surgeon: Felicie Morn, MD;  Location: [facility];  Service: General;  Laterality: N/A;   HIATAL HERNIA REPAIR N/A 10/08/2021   Procedure: HERNIA REPAIR HIATAL;  Surgeon: Clovis Riley, MD;  [facility];  Service: General;  Laterality: N/A;   IR GUIDED DRAIN W CATHETER PLACEMENT  10/18/2021   LAPAROSCOPIC GASTRIC SLEEVE RESECTION N/A 10/08/2021   Procedure: LAPAROSCOPIC GASTRIC SLEEVE RESECTION;  Surgeon: Clovis Riley, MD;  Location[facility];  Service: General;  Laterality: N/A;   TONSILLECTOMY  1960s   TUBAL LIGATION  1979   UPPER GI ENDOSCOPY N/A 10/08/2021   Procedure: UPPER GI ENDOSCOPY;  Surgeon: Clovis Riley, MD;  Location[facility];  Service: General;  Laterality: N/A;   Patient Active Problem List   Diagnosis Date Noted   Postoperative hematoma of subcutaneous tissue following non-dermatologic procedure 10/17/2021   Insulin resistance 05/11/2020   DOE (dyspnea on exertion) 02/09/2020   Depression 09/26/2019   Vitamin D deficiency 09/01/2019   Class 3 severe obesity with serious comorbidity and body mass index (BMI) of 50.0 to 59.9 in adult [facility]) 09/01/2019   Prediabetes 08/15/2019   Atrial fibrillation ([place])  Chest pain 04/22/2016   Hypokalemia 04/22/2016   Morbid obesity ([place]) 04/22/2016   Hypertension 04/22/2016   Paroxysmal A-fib ([place]) 04/22/2016   Hypomagnesemia 04/22/2016    PCP: Harlan Stains MD   REFERRING PROVIDER: Harlan Stains   REFERRING DIAG:R29.6 (ICD-10-CM) - Repeated falls   THERAPY DIAG:  Difficulty in walking, not elsewhere classified  Muscle weakness (generalized)  Repeated falls  Rationale for Evaluation and Treatment: Rehabilitation  ONSET DATE: balance has become worse over the past few months  SUBJECTIVE:   SUBJECTIVE STATEMENT:  Pt arrives with script in hand from ankle surgeon; "he  wants me to continue physical therapy".     PERTINENT HISTORY: Arthritis, A-fib, DVT. Depression, Dyspnea, headaches, LE edema,  Obesity; gastric bypass,   PAIN:   Are you in pain:  yes NPRS:  4/10 Location: Lt shoulder / Lt thumb Description: achy  PRECAUTIONS: Fall  WEIGHT BEARING RESTRICTIONS: No  FALLS:  Has patient fallen in last 6 months? Yes. Number of falls 3  Last fall: fell backwards int he shower  LIVING ENVIRONMENT: Ramp into the house  OCCUPATION:  On disability   Hobbies:  Traveling   PLOF: Independent and Independent with community mobility with device  PATIENT GOALS:  To have less falls   NEXT MD VISIT: every 2-3 months   OBJECTIVE:   DIAGNOSTIC FINDINGS:  Nothing recent   PATIENT SURVEYS:  FOTO 42 at eval 6th visit 12/24/22:   COGNITION: Overall cognitive status: Within functional limits for tasks assessed     SENSATION: WFL  EDEMA:  Past history of edema but no significant edema at this time   MUSCLE LENGTH:  POSTURE: No Significant postural limitations  PALPATION: No significant tenderness to palpation   LOWER EXTREMITY ROM:  Passive ROM Right eval Left eval  Hip flexion    Hip extension    Hip abduction    Hip adduction    Hip internal rotation    Hip external rotation    Knee flexion    Knee extension    Ankle dorsiflexion    Ankle plantarflexion    Ankle inversion    Ankle eversion     (Blank rows = not tested)  LOWER EXTREMITY MMT:  MMT Right eval Left eval Right / Left  Hip flexion 27.4 27.1 43.2 / 36.0  Hip extension     Hip abduction 26.5 30.4 25.7 / 23.6  Hip adduction     Hip internal rotation     Hip external rotation     Knee flexion     Knee extension 23.0 25.2 38.0 / 31.7  Ankle dorsiflexion     Ankle plantarflexion     Ankle inversion     Ankle eversion      (Blank rows = not tested)    FUNCTIONAL TESTS:  Berg Balance Scale:  BERG Balance Test          Eval   12/03/22  Sit to Stand 3  3  Standing unsupported 3 3  Sitting with back unsupported but feet supported 4 4  Stand to sit  3 3  Transfers  4 3  Standing unsupported with eyes closed 4 3  Standing unsupported feet together 4 3  From standing position, reach forward with outstretched arm 3 2  From standing position, pick up object from floor 4 3  From standing position, turn and look behind over each shoulder 4 3  Turn 360 2 2  Standing unsupported, alternately place foot on step 3  2  Standing unsupported, one foot in front 0 0  Standing on one leg 0 0  Total:  41 34/56    12/03/22 5X STS test: 22.19 from armed chair with use of UE TUG: 18.99 using cane  12/24/20 TUG:14s without AD  GAIT: The patient comes in today using her cane on the left side. The left side is where her foot issue is. She is now having some discomort in her left shoulder. We reviewed proper use of the cane.   TODAY'S TREATMENT:                                                                                                                              Pt seen for aquatic therapy today.  Treatment took place in water 3.5-4 ft in depth at the [facility]. Temp of water was 91.  Pt entered/exited the pool via stairs in step-to pattern independently with bilat rail.  * unsupported: walking forward/backward; tandem gait forward/ backward (cues to slow speed)  * side stepping with rainbow hand floats shoulder addct x 2 laps * tandem stance, arms close to legs, slow horiz head turns scanning room R/L  * holding yellow hand floats SLS x 25 each LE; repeated SLS without support x 20 sec  * holding solid noodle:  3 way LE kick x 5 each LE x 2  * marching forward/backward with arm swing * alternating toe taps to first step without UE support, slow and fast * holding rails:  adductor stretch, hamstring stretch, low back (L) stretch  * TrA set with 1/2 noodle pull down x 10 Pt requires the buoyancy and hydrostatic pressure of water for  support, and to offload joints by unweighting joint load by at least 50 % in navel deep water and by at least 75-80% in chest to neck deep water.  Viscosity of the water is needed for resistance of strengthening. Water current perturbations provides challenge to standing balance requiring increased core activation.  PATIENT EDUCATION:  Education details: aquatic rehab review and progressions Person educated: Patient Education method: Explanation, Demonstration, Tactile cues, and Verbal cues Education comprehension: verbalized understanding, returned demonstration, verbal cues required, tactile cues required, and needs further education  HOME EXERCISE PROGRAM: TBA  ASSESSMENT:  CLINICAL IMPRESSION: Pt tolerating aquatic exercises well, without increase in Lt shoulder pain.  Adjusted grip on floatation device to protect Lt thumb from increased pain. Pt's balance in water is improving. Therapist to check STG including Berg and MMT LLE next visit.      OBJECTIVE IMPAIRMENTS: Abnormal gait, decreased activity tolerance, decreased balance, decreased endurance, decreased mobility, difficulty walking, decreased ROM, and decreased strength.   ACTIVITY LIMITATIONS: lifting, bending, standing, squatting, stairs, and locomotion level  PARTICIPATION LIMITATIONS: meal prep, cleaning, laundry, driving, shopping, occupation, and yard work  PERSONAL FACTORS: 1-2 comorbidities: left foot surgery; low back pain  are also affecting patient's functional outcome.   REHAB POTENTIAL: Good  CLINICAL  DECISION MAKING: Evolving/moderate complexity  EVALUATION COMPLEXITY: Low   GOALS: Goals reviewed with patient? Yes  SHORT TERM GOALS: Target date: 12/31/2022   Patient will be independent with basic balance and strength HEP  Baseline: Goal status: INITIAL  2.  Patient will increase BERG score by 4 points  Baseline: 33/56 Goal status: INITIAL  3.  Patient will increase gross bilateral LE strength by 5  lbs  Baseline: see updated chart Goal status: INITIAL   LONG TERM GOALS: Target date: 01/28/24/2024    Patient will have no falls over a 2 weeks period prior to discharge  Baseline: multiple Goal status: INITIAL  2.  Patient will ambulate 3000' without loss of balance or pain in her left shoulder  Baseline:  Goal status: INITIAL  3.  Patient will have complete balance and strengthening plan  Baseline:  Goal status: INITIAL   4. Pt will improve on Tug to < 13s to demonstrate improved mobility   Baseline: 18.99 Goal Status: NEW  5. Pt will improve on 5 X STS test to <15s to demonstrate improved strength  Baseline: 22.19 from armed chair with use of ue PLAN:  PT FREQUENCY: 1-2x/week  PT DURATION: 8 weeks (allowing for potential scheduling difficulties)  PLANNED INTERVENTIONS: Therapeutic exercises, Therapeutic activity, Neuromuscular re-education, Balance training, Gait training, Patient/Family education, Self Care, Joint mobilization, DME instructions, Aquatic Therapy, Dry Needling, Electrical stimulation, Cryotherapy, Moist heat, and Manual therapy  PLAN FOR NEXT SESSION:  Patient has had success in the pool before.  Will start her out with pool balance exercises and gait training.  Will progress to land. Plan first 4-6 visits aquatic then alternate with land sessions.  Kerin Perna, PTA 12/31/22 1:33 PM [facility] [address] [place], [place], [address] Phone: [phone]   Fax:  [phone]

## 2023-01-04 NOTE — Therapy (Unsigned)
OUTPATIENT PHYSICAL THERAPY LOWER EXTREMITY TREATMENT Progress Note Reporting Period 12/03/22 to 01/05/23  See note below for Objective Data and Assessment of Progress/Goals.      Patient Name: [name] MRN: [id number] DOB:1955/11/21, 67 y.o., female Today's Date: 01/05/2023  END OF SESSION:  PT End of Session - 01/05/23 1049     Visit Number 9    Number of Visits 18    Date for PT Re-Evaluation 01/28/23    Authorization Type [employer] MCR    Progress Note Due on Visit 90    PT Start Time 1035    PT Stop Time 1115    PT Time Calculation (min) 40 min    Activity Tolerance Patient tolerated treatment well    Behavior During Therapy WFL for tasks assessed/performed                Past Medical History:  Diagnosis Date   Arthritis    Atrial fibrillation (HCC)    Chronic lower back pain    Depression    DVT (deep venous thrombosis) (HCC) 1990s   LLE   Dyspnea    Fallen arches    Family history of adverse reaction to anesthesia    Mother had rash and itching after anesthesia   Headache    Hypertension    Hyperthyroidism ~ 2000   "fine now" (04/23/2016)   Joint pain    Lactose intolerance    Leg edema    Obesity    Pneumonia 1980s X 1   Snoring    has not had sleep study but suspects sleep apnea   Past Surgical History:  Procedure Laterality Date   BIOPSY  08/09/2021   Procedure: BIOPSY;  Surgeon: Felicie Morn, MD;  Location: [facility];  Service: General;;   CARDIOVERSION N/A 06/18/2016   Procedure: CARDIOVERSION;  Surgeon: Larey Dresser, MD;  Location: [facility];  Service: Cardiovascular;  Laterality: N/A;   CARDIOVERSION N/A 07/11/2016   Procedure: CARDIOVERSION;  Surgeon: Lelon Perla, MD;  Location: [facility] ENDOSCOPY;  Service: Cardiovascular;  Laterality: N/A;   CARDIOVERSION N/A 07/02/2017   Procedure: CARDIOVERSION;  Surgeon: Sanda Klein, MD;  Location: [facility];  Service: Cardiovascular;  Laterality: N/A;   CARDIOVERSION N/A  09/29/2017   Procedure: CARDIOVERSION;  Surgeon: Larey Dresser, MD;  Location: [facility] ENDOSCOPY;  Service: Cardiovascular;  Laterality: N/A;   ENDOMETRIAL ABLATION  ~2006   ESOPHAGOGASTRODUODENOSCOPY N/A 08/09/2021   Procedure: ESOPHAGOGASTRODUODENOSCOPY (EGD);  Surgeon: Felicie Morn, MD;  Location: [facility];  Service: General;  Laterality: N/A;   HIATAL HERNIA REPAIR N/A 10/08/2021   Procedure: HERNIA REPAIR HIATAL;  Surgeon: Clovis Riley, MD;  Location: [facility];  Service: General;  Laterality: N/A;   IR GUIDED DRAIN W CATHETER PLACEMENT  10/18/2021   LAPAROSCOPIC GASTRIC SLEEVE RESECTION N/A 10/08/2021   Procedure: LAPAROSCOPIC GASTRIC SLEEVE RESECTION;  Surgeon: Clovis Riley, MD;  Location: [facility];  Service: General;  Laterality: N/A;   TONSILLECTOMY  1960s   TUBAL LIGATION  1979   UPPER GI ENDOSCOPY N/A 10/08/2021   Procedure: UPPER GI ENDOSCOPY;  Surgeon: Clovis Riley, MD;  Location: [facility];  Service: General;  Laterality: N/A;   Patient Active Problem List   Diagnosis Date Noted   Postoperative hematoma of subcutaneous tissue following non-dermatologic procedure 10/17/2021   Insulin resistance 05/11/2020   DOE (dyspnea on exertion) 02/09/2020   Depression 09/26/2019   Vitamin D deficiency 09/01/2019   Class 3 severe obesity with serious comorbidity  and body mass index (BMI) of 50.0 to 59.9 in adult [facility]) 09/01/2019   Prediabetes 08/15/2019   Atrial fibrillation ([place])    Chest pain 04/22/2016   Hypokalemia 04/22/2016   Morbid obesity ([place]) 04/22/2016   Hypertension 04/22/2016   Paroxysmal A-fib ([place]) 04/22/2016   Hypomagnesemia 04/22/2016    PCP: Harlan Stains MD   REFERRING PROVIDER: Harlan Stains   REFERRING DIAG:R29.6 (ICD-10-CM) - Repeated falls   THERAPY DIAG:  Difficulty in walking, not elsewhere classified  Muscle weakness (generalized)  Repeated falls  Rationale for Evaluation and Treatment: Rehabilitation  ONSET DATE: balance has  become worse over the past few months  SUBJECTIVE:   SUBJECTIVE STATEMENT:  Pt with questions in regards to becoming member at [facility] and continuing her therapy by herself (high co-pay)     PERTINENT HISTORY: Arthritis, A-fib, DVT. Depression, Dyspnea, headaches, LE edema,  Obesity; gastric bypass,   PAIN:   Are you in pain:  yes NPRS:  6/10 Location: Lt shoulder / Lt thumb Description: achy  PRECAUTIONS: Fall  WEIGHT BEARING RESTRICTIONS: No  FALLS:  Has patient fallen in last 6 months? Yes. Number of falls 3  Last fall: fell backwards int he shower  LIVING ENVIRONMENT: Ramp into the house  OCCUPATION:  On disability   Hobbies:  Traveling   PLOF: Independent and Independent with community mobility with device  PATIENT GOALS:  To have less falls   NEXT MD VISIT: every 2-3 months   OBJECTIVE:   DIAGNOSTIC FINDINGS:  Nothing recent   PATIENT SURVEYS:  FOTO 42 at eval 6th visit 12/24/22:   COGNITION: Overall cognitive status: Within functional limits for tasks assessed     SENSATION: WFL  EDEMA:  Past history of edema but no significant edema at this time   MUSCLE LENGTH:  POSTURE: No Significant postural limitations  PALPATION: No significant tenderness to palpation   LOWER EXTREMITY ROM:  Passive ROM Right eval Left eval  Hip flexion    Hip extension    Hip abduction    Hip adduction    Hip internal rotation    Hip external rotation    Knee flexion    Knee extension    Ankle dorsiflexion    Ankle plantarflexion    Ankle inversion    Ankle eversion     (Blank rows = not tested)  LOWER EXTREMITY MMT:  MMT Right eval Left eval Right / Left 12/03/22  Hip flexion 27.4 27.1 43.2 / 36.0  Hip extension     Hip abduction 26.5 30.4 25.7 / 23.6  Hip adduction     Hip internal rotation     Hip external rotation     Knee flexion     Knee extension 23.0 25.2 38.0 / 31.7  Ankle dorsiflexion     Ankle plantarflexion     Ankle inversion      Ankle eversion      (Blank rows = not tested)    FUNCTIONAL TESTS:  Berg Balance Scale:  BERG Balance Test          Eval   12/03/22  01/05/23  Sit to Stand '3 3 4  '$ Standing unsupported '3 3 4  '$ Sitting with back unsupported but feet supported '4 4 4  '$ Stand to sit  '3 3 4  '$ Transfers  '4 3 4  '$ Standing unsupported with eyes closed '4 3 3  '$ Standing unsupported feet together '4 3 4  '$ From standing position, reach forward with outstretched arm 3 2 3  From standing position, pick up object from floor '4 3 4  '$ From standing position, turn and look behind over each shoulder '4 3 4  '$ Turn 360 '2 2 3  '$ Standing unsupported, alternately place foot on step '3 2 2  '$ Standing unsupported, one foot in front 0 0 0  Standing on one leg 0 0 1  Total:  41 34/56 44/56    12/03/22 5X STS test: 22.19 from armed chair with use of UE TUG: 18.99 using cane  12/24/20 TUG:14s without AD  01/05/23 Berg 44/56 TUG 11.36  GAIT: The patient comes in today using her cane on the left side. The left side is where her foot issue is. She is now having some discomort in her left shoulder. We reviewed proper use of the cane.   TODAY'S TREATMENT:                                                                                                                              Pt seen for aquatic therapy today.  Treatment took place in water 3.5-4 ft in depth at the [facility]. Temp of water was 91.  Pt entered/exited the pool via stairs in step-to pattern independently with bilat rail.  * unsupported: walking forward/backward; tandem gait forward/ backward (cues to slow speed)  * side stepping with rainbow hand floats shoulder addct x 2 laps * tandem forward and backward slow horiz head turns scanning room R/L  * Unsupported SLS  x 20 sec ea> after several trys * holding solid noodle:  3 way LE tap x 5 then with rainbow HB  each LE x 2  *UE support rainbow HB: leg swings flex/ext * alternating toe taps to first step  without UE support, slow and fast  Pt requires the buoyancy and hydrostatic pressure of water for support, and to offload joints by unweighting joint load by at least 50 % in navel deep water and by at least 75-80% in chest to neck deep water.  Viscosity of the water is needed for resistance of strengthening. Water current perturbations provides challenge to standing balance requiring increased core activation.  PATIENT EDUCATION:  Education details: aquatic rehab review and progressions Person educated: Patient Education method: Explanation, Demonstration, Tactile cues, and Verbal cues Education comprehension: verbalized understanding, returned demonstration, verbal cues required, tactile cues required, and needs further education  HOME EXERCISE PROGRAM: TBA Access Code: [email or web address] URL: [email or web address] Date: 01/07/2023 Prepared by: Denton Meek  Exercises - Standing 'L' Stretch at Counter  - 1 x daily - 7 x weekly - 3 sets - 10 reps - Kettlebell Suitcase Carry  - 1 x daily - 7 x weekly - 3 sets - 10 reps - Standing Hip Hinge  - 1 x daily - 7 x weekly - 3 sets - 10 reps - Side Stepping with Hand Floats  - 1 x daily - 7 x weekly - 3 sets - 10 reps - Standing 3-Way  Leg Reach  - 1 x daily - 7 x weekly - 3 sets - 10 reps - Standing Hip Flexion Extension  - 1 x daily - 7 x weekly - 3 sets - 10 reps - Standing Bilateral Low Shoulder Row with Anchored Resistance  - 1 x daily - 7 x weekly - 3 sets - 10 reps - Shoulder Extension with Resistance  - 1 x daily - 7 x weekly - 3 sets - 10 reps - Standing Toe Taps  - 1 x daily - 7 x weekly - 3 sets - 10 reps - Tandem Stance  - 1 x daily - 7 x weekly - 3 sets - 10 reps - Single Leg Stance  - 1 x daily - 7 x weekly - 3 sets - 10 reps  ASSESSMENT:  CLINICAL IMPRESSION: PN: Pt with appt to see Dr Wilford Sports in regards to left shoulder pain.   Pt has made continues progress through last few weeks. She reports walking well over 3000 ft  over w/e at stores without increase in left shoulder pain nor LOB and has had no falls since beginning of skilled PT.  Objective testing score reflect improved functional mobility improving in all areas. She is scheduled to transition onto land based therapy next session.  Pt reports she is ready to begin membership here at [facility] as she is paying a high co-pay for PT and believes she can complete on her own.  Plan to instruct on land based HEP (also complete muscles testing and STS testing)then return to aquatics for final aquatic HEP. Potential DC in next 2 weeks.      OBJECTIVE IMPAIRMENTS: Abnormal gait, decreased activity tolerance, decreased balance, decreased endurance, decreased mobility, difficulty walking, decreased ROM, and decreased strength.   ACTIVITY LIMITATIONS: lifting, bending, standing, squatting, stairs, and locomotion level  PARTICIPATION LIMITATIONS: meal prep, cleaning, laundry, driving, shopping, occupation, and yard work  PERSONAL FACTORS: 1-2 comorbidities: left foot surgery; low back pain  are also affecting patient's functional outcome.   REHAB POTENTIAL: Good  CLINICAL DECISION MAKING: Evolving/moderate complexity  EVALUATION COMPLEXITY: Low   GOALS: Goals reviewed with patient? Yes  SHORT TERM GOALS: Target date: 12/31/2022   Patient will be independent with basic balance and strength HEP  Baseline: Goal status: INITIAL  2.  Patient will increase BERG score by 4 points  Baseline: 33/56 Goal status: Met 01/05/23 (44/56)  3.  Patient will increase gross bilateral LE strength by 5 lbs  Baseline: see updated chart Goal status: INITIAL   LONG TERM GOALS: Target date: 01/28/24/2024    Patient will have no falls over a 2 weeks period prior to discharge  Baseline: multiple Goal status: INITIAL  2.  Patient will ambulate 3000' without loss of balance or pain in her left shoulder  Baseline:  Goal status: Met 01/05/23  3.  Patient will have complete  balance and strengthening plan  Baseline:  Goal status: INITIAL   4. Pt will improve on Tug to < 13s to demonstrate improved mobility   Baseline: 18.99 Goal Status: Met 01/05/23  (11.36)  5. Pt will improve on 5 X STS test to <15s to demonstrate improved strength  Baseline: 22.19 from armed chair with use of ue  Goal Status: ongoing 01/05/23 PLAN:  PT FREQUENCY: 1-2x/week  PT DURATION: 8 weeks (allowing for potential scheduling difficulties)  PLANNED INTERVENTIONS: Therapeutic exercises, Therapeutic activity, Neuromuscular re-education, Balance training, Gait training, Patient/Family education, Self Care, Joint mobilization, DME instructions, Aquatic Therapy, Dry Needling,  Electrical stimulation, Cryotherapy, Moist heat, and Manual therapy  PLAN FOR NEXT SESSION:  Patient has had success in the pool before.  Will start her out with pool balance exercises and gait training.  Will progress to land. Plan first 4-6 visits aquatic then alternate with land sessions.  Stanton Kidney [place]) Taro Hidrogo MPT 01/05/23 1:49 PM [facility] [address] [place], [place], [address] Phone: [phone]   Fax:  [phone]  Addend Annamarie Major) Austen Oyster MPT 01/07/23 943am

## 2023-01-05 ENCOUNTER — Ambulatory Visit (HOSPITAL_BASED_OUTPATIENT_CLINIC_OR_DEPARTMENT_OTHER): Payer: Medicare Other | Admitting: Physical Therapy

## 2023-01-05 ENCOUNTER — Encounter (HOSPITAL_BASED_OUTPATIENT_CLINIC_OR_DEPARTMENT_OTHER): Payer: Self-pay | Admitting: Physical Therapy

## 2023-01-05 DIAGNOSIS — R262 Difficulty in walking, not elsewhere classified: Secondary | ICD-10-CM | POA: Diagnosis not present

## 2023-01-05 DIAGNOSIS — M6281 Muscle weakness (generalized): Secondary | ICD-10-CM | POA: Diagnosis not present

## 2023-01-05 DIAGNOSIS — R296 Repeated falls: Secondary | ICD-10-CM | POA: Diagnosis not present

## 2023-01-05 DIAGNOSIS — M25512 Pain in left shoulder: Secondary | ICD-10-CM | POA: Diagnosis not present

## 2023-01-05 DIAGNOSIS — M4722 Other spondylosis with radiculopathy, cervical region: Secondary | ICD-10-CM | POA: Diagnosis not present

## 2023-01-07 ENCOUNTER — Encounter (HOSPITAL_BASED_OUTPATIENT_CLINIC_OR_DEPARTMENT_OTHER): Payer: Self-pay

## 2023-01-07 ENCOUNTER — Ambulatory Visit (HOSPITAL_BASED_OUTPATIENT_CLINIC_OR_DEPARTMENT_OTHER): Payer: Medicare Other

## 2023-01-07 DIAGNOSIS — R262 Difficulty in walking, not elsewhere classified: Secondary | ICD-10-CM | POA: Diagnosis not present

## 2023-01-07 DIAGNOSIS — R296 Repeated falls: Secondary | ICD-10-CM

## 2023-01-07 DIAGNOSIS — M6281 Muscle weakness (generalized): Secondary | ICD-10-CM

## 2023-01-07 NOTE — Therapy (Signed)
OUTPATIENT PHYSICAL THERAPY LOWER EXTREMITY TREATMENT Progress Note Reporting Period 12/03/22 to 01/05/23  See note below for Objective Data and Assessment of Progress/Goals.      Patient Name: [name] MRN: [id number] DOB:10-08-1956, 67 y.o., female Today's Date: 01/07/2023  END OF SESSION:  PT End of Session - 01/07/23 1203     Visit Number 10    Number of Visits 18    Date for PT Re-Evaluation 01/28/23    Authorization Type [employer] MCR    PT Start Time [id number]    PT Stop Time 1100    PT Time Calculation (min) 45 min    Equipment Utilized During Treatment Gait belt    Activity Tolerance Patient tolerated treatment well    Behavior During Therapy WFL for tasks assessed/performed                Past Medical History:  Diagnosis Date   Arthritis    Atrial fibrillation (HCC)    Chronic lower back pain    Depression    DVT (deep venous thrombosis) (HCC) 1990s   LLE   Dyspnea    Fallen arches    Family history of adverse reaction to anesthesia    Mother had rash and itching after anesthesia   Headache    Hypertension    Hyperthyroidism ~ 2000   "fine now" (04/23/2016)   Joint pain    Lactose intolerance    Leg edema    Obesity    Pneumonia 1980s X 1   Snoring    has not had sleep study but suspects sleep apnea   Past Surgical History:  Procedure Laterality Date   BIOPSY  08/09/2021   Procedure: BIOPSY;  Surgeon: Felicie Morn, MD;  Location: [facility];  Service: General;;   CARDIOVERSION N/A 06/18/2016   Procedure: CARDIOVERSION;  Surgeon: Larey Dresser, MD;  Location: [facility];  Service: Cardiovascular;  Laterality: N/A;   CARDIOVERSION N/A 07/11/2016   Procedure: CARDIOVERSION;  Surgeon: Lelon Perla, MD;  Location: [facility];  Service: Cardiovascular;  Laterality: N/A;   CARDIOVERSION N/A 07/02/2017   Procedure: CARDIOVERSION;  Surgeon: Sanda Klein, MD;  Location: [facility];  Service: Cardiovascular;  Laterality: N/A;    CARDIOVERSION N/A 09/29/2017   Procedure: CARDIOVERSION;  Surgeon: Larey Dresser, MD;  Location: [facility];  Service: Cardiovascular;  Laterality: N/A;   ENDOMETRIAL ABLATION  ~2006   ESOPHAGOGASTRODUODENOSCOPY N/A 08/09/2021   Procedure: ESOPHAGOGASTRODUODENOSCOPY (EGD);  Surgeon: Felicie Morn, MD;  Location: [facility];  Service: General;  Laterality: N/A;   HIATAL HERNIA REPAIR N/A 10/08/2021   Procedure: HERNIA REPAIR HIATAL;  Surgeon: Clovis Riley, MD;  Location: [facility];  Service: General;  Laterality: N/A;   IR GUIDED DRAIN W CATHETER PLACEMENT  10/18/2021   LAPAROSCOPIC GASTRIC SLEEVE RESECTION N/A 10/08/2021   Procedure: LAPAROSCOPIC GASTRIC SLEEVE RESECTION;  Surgeon: Clovis Riley, MD;  Location: [facility];  Service: General;  Laterality: N/A;   TONSILLECTOMY  1960s   TUBAL LIGATION  1979   UPPER GI ENDOSCOPY N/A 10/08/2021   Procedure: UPPER GI ENDOSCOPY;  Surgeon: Clovis Riley, MD;  Location: [facility];  Service: General;  Laterality: N/A;   Patient Active Problem List   Diagnosis Date Noted   Postoperative hematoma of subcutaneous tissue following non-dermatologic procedure 10/17/2021   Insulin resistance 05/11/2020   DOE (dyspnea on exertion) 02/09/2020   Depression 09/26/2019   Vitamin D deficiency 09/01/2019   Class 3 severe obesity with serious comorbidity  and body mass index (BMI) of 50.0 to 59.9 in adult [facility]) 09/01/2019   Prediabetes 08/15/2019   Atrial fibrillation ([place])    Chest pain 04/22/2016   Hypokalemia 04/22/2016   Morbid obesity ([place]) 04/22/2016   Hypertension 04/22/2016   Paroxysmal A-fib ([place]) 04/22/2016   Hypomagnesemia 04/22/2016    PCP: Harlan Stains MD   REFERRING PROVIDER: Harlan Stains   REFERRING DIAG:R29.6 (ICD-10-CM) - Repeated falls   THERAPY DIAG:  Difficulty in walking, not elsewhere classified  Muscle weakness (generalized)  Repeated falls  Rationale for Evaluation and Treatment: Rehabilitation  ONSET  DATE: balance has become worse over the past few months  SUBJECTIVE:   SUBJECTIVE STATEMENT:  Pt reports she saw Dr. Tamera Punt last visit, he wants her to do PT at his office for her neck. "I would like to condense everything into one program."   Pt reports she went to grandson's baseball game and forgot her cane, but did not feel terribly unsteady.   PERTINENT HISTORY: Arthritis, A-fib, DVT. Depression, Dyspnea, headaches, LE edema,  Obesity; gastric bypass,   PAIN:   Are you in pain:  yes NPRS:  6/10 Location: Lt shoulder / Lt thumb Description: achy  PRECAUTIONS: Fall  WEIGHT BEARING RESTRICTIONS: No  FALLS:  Has patient fallen in last 6 months? Yes. Number of falls 3  Last fall: fell backwards int he shower  LIVING ENVIRONMENT: Ramp into the house  OCCUPATION:  On disability   Hobbies:  Traveling   PLOF: Independent and Independent with community mobility with device  PATIENT GOALS:  To have less falls   NEXT MD VISIT: every 2-3 months   OBJECTIVE:   DIAGNOSTIC FINDINGS:  Nothing recent   PATIENT SURVEYS:  FOTO 42 at eval 6th visit 12/24/22:   COGNITION: Overall cognitive status: Within functional limits for tasks assessed     SENSATION: WFL  EDEMA:  Past history of edema but no significant edema at this time   MUSCLE LENGTH:  POSTURE: No Significant postural limitations  PALPATION: No significant tenderness to palpation   LOWER EXTREMITY ROM:  Passive ROM Right eval Left eval  Hip flexion    Hip extension    Hip abduction    Hip adduction    Hip internal rotation    Hip external rotation    Knee flexion    Knee extension    Ankle dorsiflexion    Ankle plantarflexion    Ankle inversion    Ankle eversion     (Blank rows = not tested)  LOWER EXTREMITY MMT:  MMT Right eval Left eval Right / Left 12/03/22 Right/Left  3/13  Hip flexion 27.4 27.1 43.2 / 36.0 29.0/28.5  Hip extension      Hip abduction 26.5 30.4 25.7 / 23.6  27.5/29.2  Hip adduction      Hip internal rotation      Hip external rotation      Knee flexion      Knee extension 23.0 25.2 38.0 / 31.7 37.6/35.6  Ankle dorsiflexion      Ankle plantarflexion      Ankle inversion      Ankle eversion       (Blank rows = not tested)    FUNCTIONAL TESTS:  Berg Balance Scale:  BERG Balance Test          Eval   12/03/22  01/05/23  Sit to Stand '3 3 4  '$ Standing unsupported '3 3 4  '$ Sitting with back unsupported but feet supported 4 4  4  Stand to sit  '3 3 4  '$ Transfers  '4 3 4  '$ Standing unsupported with eyes closed '4 3 3  '$ Standing unsupported feet together '4 3 4  '$ From standing position, reach forward with outstretched arm '3 2 3  '$ From standing position, pick up object from floor '4 3 4  '$ From standing position, turn and look behind over each shoulder '4 3 4  '$ Turn 360 '2 2 3  '$ Standing unsupported, alternately place foot on step '3 2 2  '$ Standing unsupported, one foot in front 0 0 0  Standing on one leg 0 0 1  Total:  41 34/56 44/56    12/03/22 5X STS test: 22.19 from armed chair with use of UE TUG: 18.99 using cane  12/24/20 TUG:14s without AD  01/05/23 Berg 44/56 TUG 11.36  01/07/23: 5xSTS 17sec- no UE use  GAIT: Uses cane on R side as instructed.    TODAY'S TREATMENT:                 3/13: 5xSTS Updated LE strength  -Gait without AD using CGA and gait belt- hall x2 -Side stepping at rail -Romberg stance on floor EO and EC 30sec ea -Romberg stance on airex FA and FT -Romberg on airex with head turns/nods -Marching on airex (switched to floor due to hip drop) -standing marches on floor with cues for decreasing trendeleberg sign  Review of HEP     Previous:                                                                                                               Pt seen for aquatic therapy today.  Treatment took place in water 3.5-4 ft in depth at the [facility]. Temp of water was 91.  Pt entered/exited the pool via  stairs in step-to pattern independently with bilat rail.  * unsupported: walking forward/backward; tandem gait forward/ backward (cues to slow speed)  * side stepping with rainbow hand floats shoulder addct x 2 laps * tandem forward and backward slow horiz head turns scanning room R/L  * Unsupported SLS  x 20 sec ea> after several trys * holding solid noodle:  3 way LE tap x 5 then with rainbow HB  each LE x 2  *UE support rainbow HB: leg swings flex/ext * alternating toe taps to first step without UE support, slow and fast  Pt requires the buoyancy and hydrostatic pressure of water for support, and to offload joints by unweighting joint load by at least 50 % in navel deep water and by at least 75-80% in chest to neck deep water.  Viscosity of the water is needed for resistance of strengthening. Water current perturbations provides challenge to standing balance requiring increased core activation.  PATIENT EDUCATION:  Education details: aquatic rehab review and progressions Person educated: Patient Education method: Explanation, Demonstration, Tactile cues, and Verbal cues Education comprehension: verbalized understanding, returned demonstration, verbal cues required, tactile cues required, and needs further education  HOME EXERCISE PROGRAM: TBA Access Code:  [email or web address] URL: [email or web address] Date: 01/07/2023 Prepared by: Denton Meek  Exercises - Standing 'L' Stretch at Counter  - 1 x daily - 7 x weekly - 3 sets - 10 reps - Kettlebell Suitcase Carry  - 1 x daily - 7 x weekly - 3 sets - 10 reps - Standing Hip Hinge  - 1 x daily - 7 x weekly - 3 sets - 10 reps - Side Stepping with Hand Floats  - 1 x daily - 7 x weekly - 3 sets - 10 reps - Standing 3-Way Leg Reach  - 1 x daily - 7 x weekly - 3 sets - 10 reps - Standing Hip Flexion Extension  - 1 x daily - 7 x weekly - 3 sets - 10 reps - Standing Bilateral Low Shoulder Row with Anchored Resistance  - 1 x daily - 7 x  weekly - 3 sets - 10 reps - Shoulder Extension with Resistance  - 1 x daily - 7 x weekly - 3 sets - 10 reps - Standing Toe Taps  - 1 x daily - 7 x weekly - 3 sets - 10 reps - Tandem Stance  - 1 x daily - 7 x weekly - 3 sets - 10 reps - Single Leg Stance  - 1 x daily - 7 x weekly - 3 sets - 10 reps  ASSESSMENT:  CLINICAL IMPRESSION: Patient improved by 5 seconds with 5 times sit to stand test.  Trialed gait training without cane today using gait belt and CGA.  Patient has tendency to become unsteady with gait and demonstrates scissoring of lower extremities.  She notes greater fatigue with land therapy versus aquatic therapy but had overall good tolerance.  Fatigues quickly and hip abductors and demonstrate severe Trendelenburg sign with standing marches.  Patient will benefit from continued therapy to address lower extremity weakness and improve safety with gait and balance.      OBJECTIVE IMPAIRMENTS: Abnormal gait, decreased activity tolerance, decreased balance, decreased endurance, decreased mobility, difficulty walking, decreased ROM, and decreased strength.   ACTIVITY LIMITATIONS: lifting, bending, standing, squatting, stairs, and locomotion level  PARTICIPATION LIMITATIONS: meal prep, cleaning, laundry, driving, shopping, occupation, and yard work  PERSONAL FACTORS: 1-2 comorbidities: left foot surgery; low back pain  are also affecting patient's functional outcome.   REHAB POTENTIAL: Good  CLINICAL DECISION MAKING: Evolving/moderate complexity  EVALUATION COMPLEXITY: Low   GOALS: Goals reviewed with patient? Yes  SHORT TERM GOALS: Target date: 12/31/2022   Patient will be independent with basic balance and strength HEP  Baseline: Goal status: INITIAL  2.  Patient will increase BERG score by 4 points  Baseline: 33/56 Goal status: Met 01/05/23 (44/56)  3.  Patient will increase gross bilateral LE strength by 5 lbs  Baseline: see updated chart Goal status:  INITIAL   LONG TERM GOALS: Target date: 01/28/24/2024    Patient will have no falls over a 2 weeks period prior to discharge  Baseline: multiple Goal status: INITIAL  2.  Patient will ambulate 3000' without loss of balance or pain in her left shoulder  Baseline:  Goal status: Met 01/05/23  3.  Patient will have complete balance and strengthening plan  Baseline:  Goal status: INITIAL   4. Pt will improve on Tug to < 13s to demonstrate improved mobility   Baseline: 18.99 Goal Status: Met 01/05/23  (11.36)  5. Pt will improve on 5 X STS test to <15s to demonstrate improved strength  Baseline: 22.19 from armed chair with  use of ue  Goal Status: ongoing 01/05/23 PLAN:  PT FREQUENCY: 1-2x/week  PT DURATION: 8 weeks (allowing for potential scheduling difficulties)  PLANNED INTERVENTIONS: Therapeutic exercises, Therapeutic activity, Neuromuscular re-education, Balance training, Gait training, Patient/Family education, Self Care, Joint mobilization, DME instructions, Aquatic Therapy, Dry Needling, Electrical stimulation, Cryotherapy, Moist heat, and Manual therapy  PLAN FOR NEXT SESSION:  Patient has had success in the pool before.  Will start her out with pool balance exercises and gait training.  Will progress to land. Plan first 4-6 visits aquatic then alternate with land sessions.  Sherlynn Carbon, PTA  01/07/23 12:11 PM [facility] [address] [place], [place], [address] Phone: [phone]   Fax:  [phone]  Addend Annamarie Major) [name] 01/07/23 943am

## 2023-01-08 DIAGNOSIS — R922 Inconclusive mammogram: Secondary | ICD-10-CM | POA: Diagnosis not present

## 2023-01-08 DIAGNOSIS — R928 Other abnormal and inconclusive findings on diagnostic imaging of breast: Secondary | ICD-10-CM | POA: Diagnosis not present

## 2023-01-12 ENCOUNTER — Ambulatory Visit (HOSPITAL_BASED_OUTPATIENT_CLINIC_OR_DEPARTMENT_OTHER): Payer: Medicare Other | Admitting: Physical Therapy

## 2023-01-12 ENCOUNTER — Encounter (HOSPITAL_BASED_OUTPATIENT_CLINIC_OR_DEPARTMENT_OTHER): Payer: Self-pay | Admitting: Physical Therapy

## 2023-01-12 DIAGNOSIS — R296 Repeated falls: Secondary | ICD-10-CM

## 2023-01-12 DIAGNOSIS — R262 Difficulty in walking, not elsewhere classified: Secondary | ICD-10-CM

## 2023-01-12 DIAGNOSIS — M6281 Muscle weakness (generalized): Secondary | ICD-10-CM | POA: Diagnosis not present

## 2023-01-12 NOTE — Therapy (Signed)
OUTPATIENT PHYSICAL THERAPY LOWER EXTREMITY TREATMENT     Patient Name: [name] MRN: [id number] DOB:02-11-1956, 67 y.o., female Today's Date: 01/12/2023  END OF SESSION:  PT End of Session - 01/12/23 1036     Visit Number 11    Number of Visits 18    Date for PT Re-Evaluation 01/28/23    Authorization Type UHC MCR    Progress Note Due on Visit 19    PT Start Time 1030    PT Stop Time 1110    PT Time Calculation (min) 40 min                Past Medical History:  Diagnosis Date   Arthritis    Atrial fibrillation (HCC)    Chronic lower back pain    Depression    DVT (deep venous thrombosis) (HCC) 1990s   LLE   Dyspnea    Fallen arches    Family history of adverse reaction to anesthesia    Mother had rash and itching after anesthesia   Headache    Hypertension    Hyperthyroidism ~ 2000   "fine now" (04/23/2016)   Joint pain    Lactose intolerance    Leg edema    Obesity    Pneumonia 1980s X 1   Snoring    has not had sleep study but suspects sleep apnea   Past Surgical History:  Procedure Laterality Date   BIOPSY  08/09/2021   Procedure: BIOPSY;  Surgeon: Felicie Morn, MD;  Location: [facility];  Service: General;;   CARDIOVERSION N/A 06/18/2016   Procedure: CARDIOVERSION;  Surgeon: Larey Dresser, MD;  Location: [place];  Service: Cardiovascular;  Laterality: N/A;   CARDIOVERSION N/A 07/11/2016   Procedure: CARDIOVERSION;  Surgeon: Lelon Perla, MD;  Location: [facility]OSCOPY;  Service: Cardiovascular;  Laterality: N/A;   CARDIOVERSION N/A 07/02/2017   Procedure: CARDIOVERSION;  Surgeon: Sanda Klein, MD;  Location: [place] ENDOSCOPY;  Service: Cardiovascular;  Laterality: N/A;   CARDIOVERSION N/A 09/29/2017   Procedure: CARDIOVERSION;  Surgeon: Larey Dresser, MD;  Location: [facility]COPY;  Service: Cardiovascular;  Laterality: N/A;   ENDOMETRIAL ABLATION  ~2006   ESOPHAGOGASTRODUODENOSCOPY N/A 08/09/2021   Procedure:  ESOPHAGOGASTRODUODENOSCOPY (EGD);  Surgeon: Felicie Morn, MD;  Location: [facility];  Service: General;  Laterality: N/A;   HIATAL HERNIA REPAIR N/A 10/08/2021   Procedure: HERNIA REPAIR HIATAL;  Surgeon: Clovis Riley, MD;  Location: [facility];  Service: General;  Laterality: N/A;   IR GUIDED DRAIN W CATHETER PLACEMENT  10/18/2021   LAPAROSCOPIC GASTRIC SLEEVE RESECTION N/A 10/08/2021   Procedure: LAPAROSCOPIC GASTRIC SLEEVE RESECTION;  Surgeon: Clovis Riley, MD;  Location: [facility];  Service: General;  Laterality: N/A;   TONSILLECTOMY  1960s   TUBAL LIGATION  1979   UPPER GI ENDOSCOPY N/A 10/08/2021   Procedure: UPPER GI ENDOSCOPY;  Surgeon: Clovis Riley, MD;  Location: [facility];  Service: General;  Laterality: N/A;   Patient Active Problem List   Diagnosis Date Noted   Postoperative hematoma of subcutaneous tissue following non-dermatologic procedure 10/17/2021   Insulin resistance 05/11/2020   DOE (dyspnea on exertion) 02/09/2020   Depression 09/26/2019   Vitamin D deficiency 09/01/2019   Class 3 severe obesity with serious comorbidity and body mass index (BMI) of 50.0 to 59.9 in adult [facility]) 09/01/2019   Prediabetes 08/15/2019   Atrial fibrillation ([place])    Chest pain 04/22/2016   Hypokalemia 04/22/2016   Morbid obesity ([place]) 04/22/2016  Hypertension 04/22/2016   Paroxysmal A-fib ([place]) 04/22/2016   Hypomagnesemia 04/22/2016    PCP: Harlan Stains MD   REFERRING PROVIDER: Harlan Stains   REFERRING DIAG:R29.6 (ICD-10-CM) - Repeated falls   THERAPY DIAG:  Difficulty in walking, not elsewhere classified  Muscle weakness (generalized)  Repeated falls  Rationale for Evaluation and Treatment: Rehabilitation  ONSET DATE: balance has become worse over the past few months  SUBJECTIVE:   SUBJECTIVE STATEMENT:  Pt reports she saw Dr. Tamera Punt last visit, he wants her to do PT at his office for her neck.  She reports she finished up a steroid pack yesterday,  and mopped.  Her LUE is sore today.   "I really like the things that Wells Guiles has shown me".     PERTINENT HISTORY: Arthritis, A-fib, DVT. Depression, Dyspnea, headaches, LE edema,  Obesity; gastric bypass,   PAIN:   Are you in pain:  yes NPRS:  3/10 Location: Lt upper arm / Lt thumb Description: achy  PRECAUTIONS: Fall  WEIGHT BEARING RESTRICTIONS: No  FALLS:  Has patient fallen in last 6 months? Yes. Number of falls 3  Last fall: fell backwards int he shower  LIVING ENVIRONMENT: Ramp into the house  OCCUPATION:  On disability   Hobbies:  Traveling   PLOF: Independent and Independent with community mobility with device  PATIENT GOALS:  To have less falls   NEXT MD VISIT: every 2-3 months   OBJECTIVE:   DIAGNOSTIC FINDINGS:  Nothing recent   PATIENT SURVEYS:  FOTO 42 at eval 6th visit 12/24/22:   COGNITION: Overall cognitive status: Within functional limits for tasks assessed     SENSATION: WFL  EDEMA:  Past history of edema but no significant edema at this time   MUSCLE LENGTH:  POSTURE: No Significant postural limitations  PALPATION: No significant tenderness to palpation   LOWER EXTREMITY ROM:  Passive ROM Right eval Left eval  Hip flexion    Hip extension    Hip abduction    Hip adduction    Hip internal rotation    Hip external rotation    Knee flexion    Knee extension    Ankle dorsiflexion    Ankle plantarflexion    Ankle inversion    Ankle eversion     (Blank rows = not tested)  LOWER EXTREMITY MMT:  MMT Right eval Left eval Right / Left 12/03/22 Right/Left  3/13  Hip flexion 27.4 27.1 43.2 / 36.0 29.0/28.5  Hip extension      Hip abduction 26.5 30.4 25.7 / 23.6 27.5/29.2  Hip adduction      Hip internal rotation      Hip external rotation      Knee flexion      Knee extension 23.0 25.2 38.0 / 31.7 37.6/35.6  Ankle dorsiflexion      Ankle plantarflexion      Ankle inversion      Ankle eversion       (Blank rows =  not tested)    FUNCTIONAL TESTS:  Berg Balance Scale:  BERG Balance Test          Eval   12/03/22  01/05/23  Sit to Stand 3 3 4   Standing unsupported 3 3 4   Sitting with back unsupported but feet supported 4 4 4   Stand to sit  3 3 4   Transfers  4 3 4   Standing unsupported with eyes closed 4 3 3   Standing unsupported feet together 4 3 4   From standing position,  reach forward with outstretched arm 3 2 3   From standing position, pick up object from floor 4 3 4   From standing position, turn and look behind over each shoulder 4 3 4   Turn 360 2 2 3   Standing unsupported, alternately place foot on step 3 2 2   Standing unsupported, one foot in front 0 0 0  Standing on one leg 0 0 1  Total:  41 34/56 44/56    12/03/22 5X STS test: 22.19 from armed chair with use of UE TUG: 18.99 using cane  12/24/20 TUG:14s without AD  01/05/23 Berg 44/56 TUG 11.36  01/07/23: 5xSTS 17sec- no UE use  GAIT: Uses cane on R side as instructed.    TODAY'S TREATMENT:                                                                                                                            Pt seen for aquatic therapy today.  Treatment took place in water 3.5-4 ft in depth at the [place]. Temp of water was 91.  Pt entered/exited the pool via stairs in step-to pattern independently with bilat rail.  * unsupported: walking forward/backward; tandem gait forward/ backward (cues to slow speed)  * side stepping without/with rainbow hand floats shoulder addct x 3 laps * marching forward/backward with rainbow hand floats at side * tandem  stance with slow horiz head turns scanning room R/L;  NBOS and tandem stance with eyes closed 5-10s  * alternating toe taps to first step without UE support, slow and fast; then toe tap to 1st,2nd, return to neutral * holding rainbow hand floats:  leg swings into hip flex/ext x 10; hip abdct/ addct x 10; 2 sets;  staggered stance with bilat shoulder horiz  abdct/addct - and repeated in wide stance;  staggered stance with bilat shoulder addct/abdct  * return to walking forward/ backward with arm swing * TrA set with solid noodle pull down to thighs x 20 * sit to/from stand on 4th step with intermittent to no UE support on rails ( x ~12)  Pt requires the buoyancy and hydrostatic pressure of water for support, and to offload joints by unweighting joint load by at least 50 % in navel deep water and by at least 75-80% in chest to neck deep water.  Viscosity of the water is needed for resistance of strengthening. Water current perturbations provides challenge to standing balance requiring increased core activation.  Previous:   3/13: 5xSTS Updated LE strength  -Gait without AD using CGA and gait belt- hall x2 -Side stepping at rail -Romberg stance on floor EO and EC 30sec ea -Romberg stance on airex FA and FT -Romberg on airex with head turns/nods -Marching on airex (switched to floor due to hip drop) -standing marches on floor with cues for decreasing trendeleberg sign  Review of HEP  PATIENT EDUCATION:  Education details: aquatic rehab review and progressions Person educated: Patient Education method: Consulting civil engineer, Demonstration,  Tactile cues, and Verbal cues Education comprehension: verbalized understanding, returned demonstration, verbal cues required, tactile cues required, and needs further education  HOME EXERCISE PROGRAM: AQUATIC Access Code: [email or web address] URL: [email or web address] Date issued: 01/12/23 with modifications This aquatic home exercise program from [place] utilizes pictures from land based exercises, but has been adapted prior to lamination and issuance.   ASSESSMENT:  CLINICAL IMPRESSION: Issued and reviewed laminated aquatic HEP today. Pt shown variations with equipment and LE position (than what is on handout).  Pt reported that pain remained unchanged during session.  Pt voiced interest in completing  remaining sessions on land.  Pt given information re: Right Start for soft handoff.  Patient will benefit from continued therapy to address lower extremity weakness and improve safety with gait and balance. Pt is progressing well towards remaining goals.  OBJECTIVE IMPAIRMENTS: Abnormal gait, decreased activity tolerance, decreased balance, decreased endurance, decreased mobility, difficulty walking, decreased ROM, and decreased strength.   ACTIVITY LIMITATIONS: lifting, bending, standing, squatting, stairs, and locomotion level  PARTICIPATION LIMITATIONS: meal prep, cleaning, laundry, driving, shopping, occupation, and yard work  PERSONAL FACTORS: 1-2 comorbidities: left foot surgery; low back pain  are also affecting patient's functional outcome.   REHAB POTENTIAL: Good  CLINICAL DECISION MAKING: Evolving/moderate complexity  EVALUATION COMPLEXITY: Low   GOALS: Goals reviewed with patient? Yes  SHORT TERM GOALS: Target date: 12/31/2022   Patient will be independent with basic balance and strength HEP  Baseline: Goal status: INITIAL  2.  Patient will increase BERG score by 4 points  Baseline: 33/56 Goal status: Met 01/05/23 (44/56)  3.  Patient will increase gross bilateral LE strength by 5 lbs  Baseline: see updated chart Goal status: INITIAL   LONG TERM GOALS: Target date: 01/28/24/2024    Patient will have no falls over a 2 weeks period prior to discharge  Baseline: multiple Goal status: INITIAL  2.  Patient will ambulate 3000' without loss of balance or pain in her left shoulder  Baseline:  Goal status: Met 01/05/23  3.  Patient will have complete balance and strengthening plan  Baseline: 01/12/23 has aquatic HEP Goal status: Partially met    4. Pt will improve on Tug to < 13s to demonstrate improved mobility   Baseline: 18.99 Goal Status: Met 01/05/23  (11.36)  5. Pt will improve on 5 X STS test to <15s to demonstrate improved strength  Baseline: 22.19 from  armed chair with use of ue  Goal Status: ongoing 01/05/23 PLAN:  PT FREQUENCY: 1-2x/week  PT DURATION: 8 weeks (allowing for potential scheduling difficulties)  PLANNED INTERVENTIONS: Therapeutic exercises, Therapeutic activity, Neuromuscular re-education, Balance training, Gait training, Patient/Family education, Self Care, Joint mobilization, DME instructions, Aquatic Therapy, Dry Needling, Electrical stimulation, Cryotherapy, Moist heat, and Manual therapy  PLAN FOR NEXT SESSION:  Continue balance and strengthening on land; create HEP for land.  Has 3 more visits.   Kerin Perna, PTA 01/12/23 11:42 AM [facility] [address] [place], [place], [address] Phone: [phone]   Fax:  [phone]

## 2023-01-14 ENCOUNTER — Encounter (HOSPITAL_BASED_OUTPATIENT_CLINIC_OR_DEPARTMENT_OTHER): Payer: Self-pay

## 2023-01-14 ENCOUNTER — Ambulatory Visit (HOSPITAL_BASED_OUTPATIENT_CLINIC_OR_DEPARTMENT_OTHER): Payer: Medicare Other

## 2023-01-14 DIAGNOSIS — M6281 Muscle weakness (generalized): Secondary | ICD-10-CM

## 2023-01-14 DIAGNOSIS — R296 Repeated falls: Secondary | ICD-10-CM | POA: Diagnosis not present

## 2023-01-14 DIAGNOSIS — R262 Difficulty in walking, not elsewhere classified: Secondary | ICD-10-CM

## 2023-01-14 NOTE — Therapy (Signed)
OUTPATIENT PHYSICAL THERAPY LOWER EXTREMITY TREATMENT     Patient Name: [name] MRN: [id number] DOB:03-13-1956, 67 y.o., female Today's Date: 01/14/2023  END OF SESSION:  PT End of Session - 01/14/23 1211     Visit Number 12    Number of Visits 18    Date for PT Re-Evaluation 01/28/23    Authorization Type UHC MCR    Progress Note Due on Visit 15    PT Start Time 1020    PT Stop Time 1100    PT Time Calculation (min) 40 min    Equipment Utilized During Treatment Gait belt    Activity Tolerance Patient tolerated treatment well    Behavior During Therapy WFL for tasks assessed/performed                 Past Medical History:  Diagnosis Date   Arthritis    Atrial fibrillation (HCC)    Chronic lower back pain    Depression    DVT (deep venous thrombosis) (HCC) 1990s   LLE   Dyspnea    Fallen arches    Family history of adverse reaction to anesthesia    Mother had rash and itching after anesthesia   Headache    Hypertension    Hyperthyroidism ~ 2000   "fine now" (04/23/2016)   Joint pain    Lactose intolerance    Leg edema    Obesity    Pneumonia 1980s X 1   Snoring    has not had sleep study but suspects sleep apnea   Past Surgical History:  Procedure Laterality Date   BIOPSY  08/09/2021   Procedure: BIOPSY;  Surgeon: Felicie Morn, MD;  Location: [facility];  Service: General;;   CARDIOVERSION N/A 06/18/2016   Procedure: CARDIOVERSION;  Surgeon: Larey [facility]er, MD;  Location: [place];  Service: Cardiovascular;  Laterality: N/A;   CARDIOVERSION N/A 07/11/2016   Procedure: CARDIOVERSION;  Surgeon: Lelon Perla, MD;  Location: [facility] ENDOSCOPY;  Service: Cardiovascular;  Laterality: N/A;   CARDIOVERSION N/A 07/02/2017   Procedure: CARDIOVERSION;  Surgeon: Sanda Klein, MD;  Location: [place] [facility];  Service: Cardiovascular;  Laterality: N/A;   CARDIOVERSION N/A 09/29/2017   Procedure: CARDIOVERSION;  Surgeon: Larey Dresser, MD;   Location: [facility] ENDOSCOPY;  Service: Cardiovascular;  Laterality: N/A;   ENDOMETRIAL ABLATION  ~2006   ESOPHAGOGASTRODUODENOSCOPY N/A 08/09/2021   Procedure: ESOPHAGOGASTRODUODENOSCOPY (EGD);  Surgeon: Felicie Morn, MD;  Location: [facility];  Service: General;  Laterality: N/A;   HIATAL HERNIA REPAIR N/A 10/08/2021   Procedure: HERNIA REPAIR HIATAL;  Surgeon: Clovis Riley, MD;  [facility];  Service: General;  Laterality: N/A;   IR GUIDED DRAIN W CATHETER PLACEMENT  10/18/2021   LAPAROSCOPIC GASTRIC SLEEVE RESECTION N/A 10/08/2021   Procedure: LAPAROSCOPIC GASTRIC SLEEVE RESECTION;  Surgeon: Clovis Riley, MD;  Location[facility];  Service: General;  Laterality: N/A;   TONSILLECTOMY  1960s   TUBAL LIGATION  1979   UPPER GI ENDOSCOPY N/A 10/08/2021   Procedure: UPPER GI ENDOSCOPY;  Surgeon: Clovis Riley, MD;  Location[facility];  Service: General;  Laterality: N/A;   Patient Active Problem List   Diagnosis Date Noted   Postoperative hematoma of subcutaneous tissue following non-dermatologic procedure 10/17/2021   Insulin resistance 05/11/2020   DOE (dyspnea on exertion) 02/09/2020   Depression 09/26/2019   Vitamin D deficiency 09/01/2019   Class 3 severe obesity with serious comorbidity and body mass index (BMI) of 50.0 to 59.9  in adult [facility]) 09/01/2019   Prediabetes 08/15/2019   Atrial fibrillation ([place])    Chest pain 04/22/2016   Hypokalemia 04/22/2016   Morbid obesity ([place]) 04/22/2016   Hypertension 04/22/2016   Paroxysmal A-fib ([place]) 04/22/2016   Hypomagnesemia 04/22/2016    PCP: Harlan Stains MD   REFERRING PROVIDER: Harlan Stains   REFERRING DIAG:R29.6 (ICD-10-CM) - Repeated falls   THERAPY DIAG:  Difficulty in walking, not elsewhere classified  Repeated falls  Muscle weakness (generalized)  Rationale for Evaluation and Treatment: Rehabilitation  ONSET DATE: balance has become worse over the past few months  SUBJECTIVE:   SUBJECTIVE  STATEMENT:  Pt reports she saw Dr. Tamera Punt last visit, he wants her to do PT at his office for her neck.  She reports she finished up a steroid pack yesterday, and mopped.  "I'm going to see if they will let me do PT here for my neck."    PERTINENT HISTORY: Arthritis, A-fib, DVT. Depression, Dyspnea, headaches, LE edema,  Obesity; gastric bypass,   PAIN:   Are you in pain:  yes NPRS:  3/10 Location: Lt upper arm / Lt thumb Description: achy  PRECAUTIONS: Fall  WEIGHT BEARING RESTRICTIONS: No  FALLS:  Has patient fallen in last 6 months? Yes. Number of falls 3  Last fall: fell backwards int he shower  LIVING ENVIRONMENT: Ramp into the house  OCCUPATION:  On disability   Hobbies:  Traveling   PLOF: Independent and Independent with community mobility with device  PATIENT GOALS:  To have less falls   NEXT MD VISIT: every 2-3 months   OBJECTIVE:   DIAGNOSTIC FINDINGS:  Nothing recent   PATIENT SURVEYS:  FOTO 42 at eval 6th visit 12/24/22:   COGNITION: Overall cognitive status: Within functional limits for tasks assessed     SENSATION: WFL  EDEMA:  Past history of edema but no significant edema at this time   MUSCLE LENGTH:  POSTURE: No Significant postural limitations  PALPATION: No significant tenderness to palpation   LOWER EXTREMITY ROM:  Passive ROM Right eval Left eval  Hip flexion    Hip extension    Hip abduction    Hip adduction    Hip internal rotation    Hip external rotation    Knee flexion    Knee extension    Ankle dorsiflexion    Ankle plantarflexion    Ankle inversion    Ankle eversion     (Blank rows = not tested)  LOWER EXTREMITY MMT:  MMT Right eval Left eval Right / Left 12/03/22 Right/Left  3/13  Hip flexion 27.4 27.1 43.2 / 36.0 29.0/28.5  Hip extension      Hip abduction 26.5 30.4 25.7 / 23.6 27.5/29.2  Hip adduction      Hip internal rotation      Hip external rotation      Knee flexion      Knee extension  23.0 25.2 38.0 / 31.7 37.6/35.6  Ankle dorsiflexion      Ankle plantarflexion      Ankle inversion      Ankle eversion       (Blank rows = not tested)    FUNCTIONAL TESTS:  Berg Balance Scale:  BERG Balance Test          Eval   12/03/22  01/05/23  Sit to Stand 3 3 4   Standing unsupported 3 3 4   Sitting with back unsupported but feet supported 4 4 4   Stand to sit  3 3  4  Transfers  4 3 4   Standing unsupported with eyes closed 4 3 3   Standing unsupported feet together 4 3 4   From standing position, reach forward with outstretched arm 3 2 3   From standing position, pick up object from floor 4 3 4   From standing position, turn and look behind over each shoulder 4 3 4   Turn 360 2 2 3   Standing unsupported, alternately place foot on step 3 2 2   Standing unsupported, one foot in front 0 0 0  Standing on one leg 0 0 1  Total:  41 34/56 44/56    12/03/22 5X STS test: 22.19 from armed chair with use of UE TUG: 18.99 using cane  12/24/20 TUG:14s without AD  01/05/23 Berg 44/56 TUG 11.36  01/07/23: 5xSTS 17sec- no UE use  GAIT: Uses cane on R side as instructed.    TODAY'S TREATMENT:                 3/20:  -Gait with AD using CGA and gait belt- hall x2 -Modified SLS with opposie toe touch on 2" step- 4x30sec each. Cues for glute med engagement -Romberg stance on floor EC 30sec x3 -Romberg stance on airex FT with perturbations x2 -standing marches on floor with cues for decreasing trendeleberg sign   Previous:   3/13: 5xSTS Updated LE strength  -Gait without AD using CGA and gait belt- hall x2 -Side stepping at rail -Romberg stance on floor EO and EC 30sec ea -Romberg stance on airex FA and FT -Romberg on airex with head turns/nods -Marching on airex (switched to floor due to hip drop) -standing marches on floor with cues for decreasing trendeleberg sign  Review of HEP  PATIENT EDUCATION:  Education details: aquatic rehab review and progressions Person educated:  Patient Education method: Consulting civil engineer, Demonstration, Tactile cues, and Verbal cues Education comprehension: verbalized understanding, returned demonstration, verbal cues required, tactile cues required, and needs further education  HOME EXERCISE PROGRAM: AQUATIC Access Code: [email or web address] URL: [email or web address] Date issued: 01/12/23 with modifications This aquatic home exercise program from [place] utilizes pictures from land based exercises, but has been adapted prior to lamination and issuance.   ASSESSMENT:  CLINICAL IMPRESSION: Again worked on glute medius strengthening in standing position today performing SLS with opposite toe touch on 2" step. Pt was able to maintain equal pelvic position with this and could maintain for ~30 seconds before fatigue. Instructed pt how to practice this at home to build endurance and prevent hip drop with gait. She will benefit form continued PT to improve strength and function. She was fitted for "[employer] recently and will receive in April.  OBJECTIVE IMPAIRMENTS: Abnormal gait, decreased activity tolerance, decreased balance, decreased endurance, decreased mobility, difficulty walking, decreased ROM, and decreased strength.   ACTIVITY LIMITATIONS: lifting, bending, standing, squatting, stairs, and locomotion level  PARTICIPATION LIMITATIONS: meal prep, cleaning, laundry, driving, shopping, occupation, and yard work  PERSONAL FACTORS: 1-2 comorbidities: left foot surgery; low back pain  are also affecting patient's functional outcome.   REHAB POTENTIAL: Good  CLINICAL DECISION MAKING: Evolving/moderate complexity  EVALUATION COMPLEXITY: Low   GOALS: Goals reviewed with patient? Yes  SHORT TERM GOALS: Target date: 12/31/2022   Patient will be independent with basic balance and strength HEP  Baseline: Goal status: INITIAL  2.  Patient will increase BERG score by 4 points  Baseline: 33/56 Goal status: Met 01/05/23  (44/56)  3.  Patient will increase gross bilateral LE strength by 5 lbs  Baseline:  see updated chart Goal status: INITIAL   LONG TERM GOALS: Target date: 01/28/24/2024    Patient will have no falls over a 2 weeks period prior to discharge  Baseline: multiple Goal status: INITIAL  2.  Patient will ambulate 3000' without loss of balance or pain in her left shoulder  Baseline:  Goal status: Met 01/05/23  3.  Patient will have complete balance and strengthening plan  Baseline: 01/12/23 has aquatic HEP Goal status: Partially met    4. Pt will improve on Tug to < 13s to demonstrate improved mobility   Baseline: 18.99 Goal Status: Met 01/05/23  (11.36)  5. Pt will improve on 5 X STS test to <15s to demonstrate improved strength  Baseline: 22.19 from armed chair with use of ue  Goal Status: ongoing 01/05/23 PLAN:  PT FREQUENCY: 1-2x/week  PT DURATION: 8 weeks (allowing for potential scheduling difficulties)  PLANNED INTERVENTIONS: Therapeutic exercises, Therapeutic activity, Neuromuscular re-education, Balance training, Gait training, Patient/Family education, Self Care, Joint mobilization, DME instructions, Aquatic Therapy, Dry Needling, Electrical stimulation, Cryotherapy, Moist heat, and Manual therapy  PLAN FOR NEXT SESSION:  Continue balance and strengthening on land; create HEP for land.  Has 3 more visits.   Sherlynn Carbon, PTA  01/14/23 12:12 PM [facility] [address] [place], [place], [address] Phone: [phone]   Fax:  [phone]

## 2023-01-19 ENCOUNTER — Ambulatory Visit (HOSPITAL_BASED_OUTPATIENT_CLINIC_OR_DEPARTMENT_OTHER): Payer: Medicare Other | Admitting: Physical Therapy

## 2023-01-21 ENCOUNTER — Encounter (HOSPITAL_BASED_OUTPATIENT_CLINIC_OR_DEPARTMENT_OTHER): Payer: Self-pay

## 2023-01-21 ENCOUNTER — Ambulatory Visit (HOSPITAL_BASED_OUTPATIENT_CLINIC_OR_DEPARTMENT_OTHER): Payer: Medicare Other

## 2023-01-21 DIAGNOSIS — R296 Repeated falls: Secondary | ICD-10-CM | POA: Diagnosis not present

## 2023-01-21 DIAGNOSIS — R262 Difficulty in walking, not elsewhere classified: Secondary | ICD-10-CM

## 2023-01-21 DIAGNOSIS — M6281 Muscle weakness (generalized): Secondary | ICD-10-CM

## 2023-01-21 NOTE — Therapy (Signed)
OUTPATIENT PHYSICAL THERAPY LOWER EXTREMITY TREATMENT     Patient Name: [name] MRN: [id number] DOB:31-May-1956, 67 y.o., female Today's Date: 01/21/2023  END OF SESSION:  PT End of Session - 01/21/23 1010     Visit Number 13    Number of Visits 18    Date for PT Re-Evaluation 01/28/23    Authorization Type UHC MCR    Progress Note Due on Visit 5    PT Start Time 1010    PT Stop Time 1048    PT Time Calculation (min) 38 min    Activity Tolerance Patient tolerated treatment well    Behavior During Therapy WFL for tasks assessed/performed                 Past Medical History:  Diagnosis Date   Arthritis    Atrial fibrillation (HCC)    Chronic lower back pain    Depression    DVT (deep venous thrombosis) (HCC) 1990s   LLE   Dyspnea    Fallen arches    Family history of adverse reaction to anesthesia    Mother had rash and itching after anesthesia   Headache    Hypertension    Hyperthyroidism ~ 2000   "fine now" (04/23/2016)   Joint pain    Lactose intolerance    Leg edema    Obesity    Pneumonia 1980s X 1   Snoring    has not had sleep study but suspects sleep apnea   Past Surgical History:  Procedure Laterality Date   BIOPSY  08/09/2021   Procedure: BIOPSY;  Surgeon: Felicie Morn, MD;  Location: [facility];  Service: General;;   CARDIOVERSION N/A 06/18/2016   Procedure: CARDIOVERSION;  Surgeon: Larey Dresser, MD;  Location: [place];  Service: Cardiovascular;  Laterality: N/A;   CARDIOVERSION N/A 07/11/2016   Procedure: CARDIOVERSION;  Surgeon: Lelon Perla, MD;  Location: [facility]OSCOPY;  Service: Cardiovascular;  Laterality: N/A;   CARDIOVERSION N/A 07/02/2017   Procedure: CARDIOVERSION;  Surgeon: Sanda Klein, MD;  Location: [place] [facility];  Service: Cardiovascular;  Laterality: N/A;   CARDIOVERSION N/A 09/29/2017   Procedure: CARDIOVERSION;  Surgeon: Larey Dresser, MD;  Location: [facility] ENDOSCOPY;  Service: Cardiovascular;   Laterality: N/A;   ENDOMETRIAL ABLATION  ~2006   ESOPHAGOGASTRODUODENOSCOPY N/A 08/09/2021   Procedure: ESOPHAGOGASTRODUODENOSCOPY (EGD);  Surgeon: Felicie Morn, MD;  Location: [facility];  Service: General;  Laterality: N/A;   HIATAL HERNIA REPAIR N/A 10/08/2021   Procedure: HERNIA REPAIR HIATAL;  Surgeon: Clovis Riley, MD;  Location: [facility];  Service: General;  Laterality: N/A;   IR GUIDED DRAIN W CATHETER PLACEMENT  10/18/2021   LAPAROSCOPIC GASTRIC SLEEVE RESECTION N/A 10/08/2021   Procedure: LAPAROSCOPIC GASTRIC SLEEVE RESECTION;  Surgeon: Clovis Riley, MD;  Location: [facility];  Service: General;  Laterality: N/A;   TONSILLECTOMY  1960s   TUBAL LIGATION  1979   UPPER GI ENDOSCOPY N/A 10/08/2021   Procedure: UPPER GI ENDOSCOPY;  Surgeon: Clovis Riley, MD;  Location: [facility];  Service: General;  Laterality: N/A;   Patient Active Problem List   Diagnosis Date Noted   Postoperative hematoma of subcutaneous tissue following non-dermatologic procedure 10/17/2021   Insulin resistance 05/11/2020   DOE (dyspnea on exertion) 02/09/2020   Depression 09/26/2019   Vitamin D deficiency 09/01/2019   Class 3 severe obesity with serious comorbidity and body mass index (BMI) of 50.0 to 59.9 in adult [facility]) 09/01/2019   Prediabetes 08/15/2019  Atrial fibrillation ([place])    Chest pain 04/22/2016   Hypokalemia 04/22/2016   Morbid obesity ([place]) 04/22/2016   Hypertension 04/22/2016   Paroxysmal A-fib ([place]) 04/22/2016   Hypomagnesemia 04/22/2016    PCP: Harlan Stains MD   REFERRING PROVIDER: Harlan Stains   REFERRING DIAG:R29.6 (ICD-10-CM) - Repeated falls   THERAPY DIAG:  Difficulty in walking, not elsewhere classified  Repeated falls  Muscle weakness (generalized)  Rationale for Evaluation and Treatment: Rehabilitation  ONSET DATE: balance has become worse over the past few months  SUBJECTIVE:   SUBJECTIVE STATEMENT:  Pt reports she had no pain while she was at  the beach, but woke up this morning with a lot of muscle soreness/achiness all over her body. "It might be the rainy weather."    PERTINENT HISTORY: Arthritis, A-fib, DVT. Depression, Dyspnea, headaches, LE edema,  Obesity; gastric bypass,   PAIN:   Are you in pain:  yes NPRS:  3/10 Location: Lt upper arm / Lt thumb Description: achy  PRECAUTIONS: Fall  WEIGHT BEARING RESTRICTIONS: No  FALLS:  Has patient fallen in last 6 months? Yes. Number of falls 3  Last fall: fell backwards int he shower  LIVING ENVIRONMENT: Ramp into the house  OCCUPATION:  On disability   Hobbies:  Traveling   PLOF: Independent and Independent with community mobility with device  PATIENT GOALS:  To have less falls   NEXT MD VISIT: every 2-3 months   OBJECTIVE:   DIAGNOSTIC FINDINGS:  Nothing recent   PATIENT SURVEYS:  FOTO 42 at eval 6th visit 12/24/22:   COGNITION: Overall cognitive status: Within functional limits for tasks assessed     SENSATION: WFL  EDEMA:  Past history of edema but no significant edema at this time   MUSCLE LENGTH:  POSTURE: No Significant postural limitations  PALPATION: No significant tenderness to palpation   LOWER EXTREMITY ROM:  Passive ROM Right eval Left eval  Hip flexion    Hip extension    Hip abduction    Hip adduction    Hip internal rotation    Hip external rotation    Knee flexion    Knee extension    Ankle dorsiflexion    Ankle plantarflexion    Ankle inversion    Ankle eversion     (Blank rows = not tested)  LOWER EXTREMITY MMT:  MMT Right eval Left eval Right / Left 12/03/22 Right/Left  3/13  Hip flexion 27.4 27.1 43.2 / 36.0 29.0/28.5  Hip extension      Hip abduction 26.5 30.4 25.7 / 23.6 27.5/29.2  Hip adduction      Hip internal rotation      Hip external rotation      Knee flexion      Knee extension 23.0 25.2 38.0 / 31.7 37.6/35.6  Ankle dorsiflexion      Ankle plantarflexion      Ankle inversion       Ankle eversion       (Blank rows = not tested)    FUNCTIONAL TESTS:  Berg Balance Scale:  BERG Balance Test          Eval   12/03/22  01/05/23  Sit to Stand 3 3 4   Standing unsupported 3 3 4   Sitting with back unsupported but feet supported 4 4 4   Stand to sit  3 3 4   Transfers  4 3 4   Standing unsupported with eyes closed 4 3 3   Standing unsupported feet together 4 3 4  From standing position, reach forward with outstretched arm 3 2 3   From standing position, pick up object from floor 4 3 4   From standing position, turn and look behind over each shoulder 4 3 4   Turn 360 2 2 3   Standing unsupported, alternately place foot on step 3 2 2   Standing unsupported, one foot in front 0 0 0  Standing on one leg 0 0 1  Total:  41 34/56 44/56    12/03/22 5X STS test: 22.19 from armed chair with use of UE TUG: 18.99 using cane  12/24/20 TUG:14s without AD  01/05/23 Berg 44/56 TUG 11.36  01/07/23: 5xSTS 17sec- no UE use  GAIT: Uses cane on R side as instructed.    TODAY'S TREATMENT:                 3/27:  -Gait with AD - hall x2 -Modified SLS with opposie toe touch on 2" step- 3x30sec each. Cues for glute med engagement -Romberg stance on airex FT with perturbations x2 -standing marches on floor with cues for decreasing trendeleberg sign 2x10 -Side stepping at rail x2 laps -Sidelying clam 2x10bil -seated clam 3sec hold GTB x30  3/20:  -Gait with AD using CGA and gait belt- hall x2 -Modified SLS with opposie toe touch on 2" step- 4x30sec each. Cues for glute med engagement -Romberg stance on floor EC 30sec x3 -Romberg stance on airex FT with perturbations x2 -standing marches on floor with cues for decreasing trendeleberg sign   PATIENT EDUCATION:  Education details: aquatic rehab review and progressions Person educated: Patient Education method: Explanation, Demonstration, Tactile cues, and Verbal cues Education comprehension: verbalized understanding, returned  demonstration, verbal cues required, tactile cues required, and needs further education  HOME EXERCISE PROGRAM: AQUATIC Access Code: [email or web address] URL: [email or web address] Date issued: 01/12/23 with modifications This aquatic home exercise program from [employer] utilizes pictures from land based exercises, but has been adapted prior to lamination and issuance.   ASSESSMENT:  CLINICAL IMPRESSION: Continued to work on glute medius strengthening and added in [place] with which she felt significant muscular fatigue with. She was provided with this for HEP to improve strengthening. Improving with balance tasks on compliant surface and she demonstrates improved self awareness of trendelenburg and trunk lean and is able to better correct this than previously. Pt will continue to benefit from strengthening and stability challenges.  OBJECTIVE IMPAIRMENTS: Abnormal gait, decreased activity tolerance, decreased balance, decreased endurance, decreased mobility, difficulty walking, decreased ROM, and decreased strength.   ACTIVITY LIMITATIONS: lifting, bending, standing, squatting, stairs, and locomotion level  PARTICIPATION LIMITATIONS: meal prep, cleaning, laundry, driving, shopping, occupation, and yard work  PERSONAL FACTORS: 1-2 comorbidities: left foot surgery; low back pain  are also affecting patient's functional outcome.   REHAB POTENTIAL: Good  CLINICAL DECISION MAKING: Evolving/moderate complexity  EVALUATION COMPLEXITY: Low   GOALS: Goals reviewed with patient? Yes  SHORT TERM GOALS: Target date: 12/31/2022   Patient will be independent with basic balance and strength HEP  Baseline: Goal status: INITIAL  2.  Patient will increase BERG score by 4 points  Baseline: 33/56 Goal status: Met 01/05/23 (44/56)  3.  Patient will increase gross bilateral LE strength by 5 lbs  Baseline: see updated chart Goal status: INITIAL   LONG TERM GOALS: Target date:  01/28/24/2024    Patient will have no falls over a 2 weeks period prior to discharge  Baseline: multiple Goal status: INITIAL  2.  Patient will ambulate 3000' without loss of  balance or pain in her left shoulder  Baseline:  Goal status: Met 01/05/23  3.  Patient will have complete balance and strengthening plan  Baseline: 01/12/23 has aquatic HEP Goal status: Partially met    4. Pt will improve on Tug to < 13s to demonstrate improved mobility   Baseline: 18.99 Goal Status: Met 01/05/23  (11.36)  5. Pt will improve on 5 X STS test to <15s to demonstrate improved strength  Baseline: 22.19 from armed chair with use of ue  Goal Status: ongoing 01/05/23 PLAN:  PT FREQUENCY: 1-2x/week  PT DURATION: 8 weeks (allowing for potential scheduling difficulties)  PLANNED INTERVENTIONS: Therapeutic exercises, Therapeutic activity, Neuromuscular re-education, Balance training, Gait training, Patient/Family education, Self Care, Joint mobilization, DME instructions, Aquatic Therapy, Dry Needling, Electrical stimulation, Cryotherapy, Moist heat, and Manual therapy  PLAN FOR NEXT SESSION:  Continue balance and strengthening on land; create HEP for land.  Has 3 more visits.   Sherlynn Carbon, PTA  01/21/23 10:55 AM [facility] [address] [place], [place], [address] Phone: [phone]   Fax:  [phone]

## 2023-01-26 ENCOUNTER — Ambulatory Visit (HOSPITAL_BASED_OUTPATIENT_CLINIC_OR_DEPARTMENT_OTHER): Payer: Medicare Other | Admitting: Physical Therapy

## 2023-01-28 ENCOUNTER — Encounter (HOSPITAL_BASED_OUTPATIENT_CLINIC_OR_DEPARTMENT_OTHER): Payer: Self-pay

## 2023-01-28 ENCOUNTER — Ambulatory Visit (HOSPITAL_BASED_OUTPATIENT_CLINIC_OR_DEPARTMENT_OTHER): Payer: Medicare Other | Attending: Family Medicine

## 2023-01-28 DIAGNOSIS — R262 Difficulty in walking, not elsewhere classified: Secondary | ICD-10-CM

## 2023-01-28 DIAGNOSIS — M6281 Muscle weakness (generalized): Secondary | ICD-10-CM | POA: Diagnosis not present

## 2023-01-28 DIAGNOSIS — R296 Repeated falls: Secondary | ICD-10-CM | POA: Diagnosis not present

## 2023-01-28 NOTE — Therapy (Signed)
OUTPATIENT PHYSICAL THERAPY LOWER EXTREMITY TREATMENT     Patient Name: [name] MRN: [id number] DOB:1956-08-09, 67 y.o., female Today's Date: 01/28/2023  END OF SESSION:  PT End of Session - 01/28/23 1100     Visit Number 14    Number of Visits 18    Date for PT Re-Evaluation 01/28/23    Authorization Type UHC MCR    Progress Note Due on Visit 69    PT Start Time 1009    PT Stop Time 1049    PT Time Calculation (min) 40 min    Activity Tolerance Patient tolerated treatment well    Behavior During Therapy WFL for tasks assessed/performed                 Past Medical History:  Diagnosis Date   Arthritis    Atrial fibrillation    Chronic lower back pain    Depression    DVT (deep venous thrombosis) 1990s   LLE   Dyspnea    Fallen arches    Family history of adverse reaction to anesthesia    Mother had rash and itching after anesthesia   Headache    Hypertension    Hyperthyroidism ~ 2000   "fine now" (04/23/2016)   Joint pain    Lactose intolerance    Leg edema    Obesity    Pneumonia 1980s X 1   Snoring    has not had sleep study but suspects sleep apnea   Past Surgical History:  Procedure Laterality Date   BIOPSY  08/09/2021   Procedure: BIOPSY;  Surgeon: Felicie Morn, MD;  Location: [facility];  Service: General;;   CARDIOVERSION N/A 06/18/2016   Procedure: CARDIOVERSION;  Surgeon: Larey Dresser, MD;  Location: [place];  Service: Cardiovascular;  Laterality: N/A;   CARDIOVERSION N/A 07/11/2016   Procedure: CARDIOVERSION;  Surgeon: Lelon Perla, MD;  Location[facility] ENDOSCOPY;  Service: Cardiovascular;  Laterality: N/A;   CARDIOVERSION N/A 07/02/2017   Procedure: CARDIOVERSION;  Surgeon: Sanda Klein, MD;  Location: [place] [facility]OSCOPY;  Service: Cardiovascular;  Laterality: N/A;   CARDIOVERSION N/A 09/29/2017   Procedure: CARDIOVERSION;  Surgeon: Larey Dresser, MD;  Location: [facility] ENDOSCOPY;  Service: Cardiovascular;  Laterality:  N/A;   ENDOMETRIAL ABLATION  ~2006   ESOPHAGOGASTRODUODENOSCOPY N/A 08/09/2021   Procedure: ESOPHAGOGASTRODUODENOSCOPY (EGD);  Surgeon: Felicie Morn, MD;  Location: [facility];  Service: General;  Laterality: N/A;   HIATAL HERNIA REPAIR N/A 10/08/2021   Procedure: HERNIA REPAIR HIATAL;  Surgeon: Clovis Riley, MD;  Location: [facility];  Service: General;  Laterality: N/A;   IR GUIDED DRAIN W CATHETER PLACEMENT  10/18/2021   LAPAROSCOPIC GASTRIC SLEEVE RESECTION N/A 10/08/2021   Procedure: LAPAROSCOPIC GASTRIC SLEEVE RESECTION;  Surgeon: Clovis Riley, MD;  Location: [facility];  Service: General;  Laterality: N/A;   TONSILLECTOMY  1960s   TUBAL LIGATION  1979   UPPER GI ENDOSCOPY N/A 10/08/2021   Procedure: UPPER GI ENDOSCOPY;  Surgeon: Clovis Riley, MD;  Location: [facility];  Service: General;  Laterality: N/A;   Patient Active Problem List   Diagnosis Date Noted   Postoperative hematoma of subcutaneous tissue following non-dermatologic procedure 10/17/2021   Insulin resistance 05/11/2020   DOE (dyspnea on exertion) 02/09/2020   Depression 09/26/2019   Vitamin D deficiency 09/01/2019   Class 3 severe obesity with serious comorbidity and body mass index (BMI) of 50.0 to 59.9 in adult 09/01/2019   Prediabetes 08/15/2019   Atrial fibrillation  Chest pain 04/22/2016   Hypokalemia 04/22/2016   Morbid obesity 04/22/2016   Hypertension 04/22/2016   Paroxysmal A-fib 04/22/2016   Hypomagnesemia 04/22/2016    PCP: Harlan Stains MD   REFERRING PROVIDER: Harlan Stains   REFERRING DIAG:R29.6 (ICD-10-CM) - Repeated falls   THERAPY DIAG:  Difficulty in walking, not elsewhere classified  Repeated falls  Muscle weakness (generalized)  Rationale for Evaluation and Treatment: Rehabilitation  ONSET DATE: balance has become worse over the past few months  SUBJECTIVE:   SUBJECTIVE STATEMENT:  Pt reports pain/ catch feeling in medial thigh and medial shin/knee which  started yesterday and progressively worsened since. She has hx of varicose veins which can ache at times and wonders if this is contributing. "Maybe it's the weather too." Pt is on blood thinners and has  had a blood clot in the past. "It doesn't feel like a clot."   PERTINENT HISTORY: Arthritis, A-fib, DVT. Depression, Dyspnea, headaches, LE edema,  Obesity; gastric bypass,   PAIN:   Are you in pain:  yes NPRS:  3/10 Location: Lt upper arm / Lt thumb Description: achy  PRECAUTIONS: Fall  WEIGHT BEARING RESTRICTIONS: No  FALLS:  Has patient fallen in last 6 months? Yes. Number of falls 3  Last fall: fell backwards int he shower  LIVING ENVIRONMENT: Ramp into the house  OCCUPATION:  On disability   Hobbies:  Traveling   PLOF: Independent and Independent with community mobility with device  PATIENT GOALS:  To have less falls   NEXT MD VISIT: every 2-3 months   OBJECTIVE:   DIAGNOSTIC FINDINGS:  Nothing recent   PATIENT SURVEYS:  FOTO 42 at eval 6th visit 12/24/22:   COGNITION: Overall cognitive status: Within functional limits for tasks assessed     SENSATION: WFL  EDEMA:  Past history of edema but no significant edema at this time   MUSCLE LENGTH:  POSTURE: No Significant postural limitations  PALPATION: No significant tenderness to palpation   LOWER EXTREMITY ROM:  Passive ROM Right eval Left eval  Hip flexion    Hip extension    Hip abduction    Hip adduction    Hip internal rotation    Hip external rotation    Knee flexion    Knee extension    Ankle dorsiflexion    Ankle plantarflexion    Ankle inversion    Ankle eversion     (Blank rows = not tested)  LOWER EXTREMITY MMT:  MMT Right eval Left eval Right / Left 12/03/22 Right/Left  3/13  Hip flexion 27.4 27.1 43.2 / 36.0 29.0/28.5  Hip extension      Hip abduction 26.5 30.4 25.7 / 23.6 27.5/29.2  Hip adduction      Hip internal rotation      Hip external rotation      Knee  flexion      Knee extension 23.0 25.2 38.0 / 31.7 37.6/35.6  Ankle dorsiflexion      Ankle plantarflexion      Ankle inversion      Ankle eversion       (Blank rows = not tested)    FUNCTIONAL TESTS:  Berg Balance Scale:  BERG Balance Test          Eval   12/03/22  01/05/23  Sit to Stand 3 3 4   Standing unsupported 3 3 4   Sitting with back unsupported but feet supported 4 4 4   Stand to sit  3 3 4   Transfers  4 3  4  Standing unsupported with eyes closed 4 3 3   Standing unsupported feet together 4 3 4   From standing position, reach forward with outstretched arm 3 2 3   From standing position, pick up object from floor 4 3 4   From standing position, turn and look behind over each shoulder 4 3 4   Turn 360 2 2 3   Standing unsupported, alternately place foot on step 3 2 2   Standing unsupported, one foot in front 0 0 0  Standing on one leg 0 0 1  Total:  41 34/56 44/56    12/03/22 5X STS test: 22.19 from armed chair with use of UE TUG: 18.99 using cane  12/24/20 TUG:14s without AD  01/05/23 Berg 44/56 TUG 11.36  01/07/23: 5xSTS 17sec- no UE use  GAIT: Uses cane on R side as instructed.    TODAY'S TREATMENT:                 4/3:  -Nu-step 89min L3 Gait with AD - hall x2 -Sidelying clam 2x10bil -Long sit HSS -Supine HSS with strap L 30secx3 -Modified HR/TR -Lateral wgt shifts (some HS discomfort) -L hip PROM  3/27:  -Gait with AD - hall x2 -Modified SLS with opposie toe touch on 2" step- 3x30sec each. Cues for glute med engagement -Romberg stance on airex FT with perturbations x2 -standing marches on floor with cues for decreasing trendeleberg sign 2x10 -Side stepping at rail x2 laps -Sidelying clam 2x10bil -seated clam 3sec hold GTB x30  3/20:  -Gait with AD using CGA and gait belt- hall x2 -Modified SLS with opposie toe touch on 2" step- 4x30sec each. Cues for glute med engagement -Romberg stance on floor EC 30sec x3 -Romberg stance on airex FT with  perturbations x2 -standing marches on floor with cues for decreasing trendeleberg sign   PATIENT EDUCATION:  Education details: aquatic rehab review and progressions Person educated: Patient Education method: Explanation, Demonstration, Tactile cues, and Verbal cues Education comprehension: verbalized understanding, returned demonstration, verbal cues required, tactile cues required, and needs further education  HOME EXERCISE PROGRAM: AQUATIC Access Code: [email or web address] URL: [email or web address] Date issued: 01/12/23 with modifications This aquatic home exercise program from [employer] [employer] utilizes pictures from land based exercises, but has been adapted prior to lamination and issuance.   ASSESSMENT:  CLINICAL IMPRESSION: Tenderness in painful areas is very localized and does not appear to be muscular in nature. No pain with passive DF. Advised pt to return to vein doctor if sx do not improve. Pt felt relief following Nu-step performance and was able to ambulate with less discomfort afterwards. She is significantly tighter in L HS compared to R so instructed pt to stretch this at home. Avoided single leg WB activities today to avoid over stressing L LE. She reports her MD wanted her to have compression hose fitted, which she has never done, but will look into going to elastic therapies. At end of session she felt decreased pain level with ambulation. Will continue to monitor her sx and progress as tolerated.   OBJECTIVE IMPAIRMENTS: Abnormal gait, decreased activity tolerance, decreased balance, decreased endurance, decreased mobility, difficulty walking, decreased ROM, and decreased strength.   ACTIVITY LIMITATIONS: lifting, bending, standing, squatting, stairs, and locomotion level  PARTICIPATION LIMITATIONS: meal prep, cleaning, laundry, driving, shopping, occupation, and yard work  PERSONAL FACTORS: 1-2 comorbidities: left foot surgery; low back pain  are also affecting patient's  functional outcome.   REHAB POTENTIAL: Good  CLINICAL DECISION MAKING: Evolving/moderate complexity  EVALUATION COMPLEXITY: Low  GOALS: Goals reviewed with patient? Yes  SHORT TERM GOALS: Target date: 12/31/2022   Patient will be independent with basic balance and strength HEP  Baseline: Goal status: MET 01/28/23  2.  Patient will increase BERG score by 4 points  Baseline: 33/56 Goal status: Met 01/05/23 (44/56)  3.  Patient will increase gross bilateral LE strength by 5 lbs  Baseline: see updated chart Goal status: INITIAL   LONG TERM GOALS: Target date: 01/28/24/2024    Patient will have no falls over a 2 weeks period prior to discharge  Baseline: multiple Goal status: IN PROGRESS 4/3 (no falls reported)  2.  Patient will ambulate 3000' without loss of balance or pain in her left shoulder  Baseline:  Goal status: Met 01/05/23  3.  Patient will have complete balance and strengthening plan  Baseline: 01/12/23 has aquatic HEP Goal status: Partially met    4. Pt will improve on Tug to < 13s to demonstrate improved mobility   Baseline: 18.99 Goal Status: Met 01/05/23  (11.36)  5. Pt will improve on 5 X STS test to <15s to demonstrate improved strength  Baseline: 22.19 from armed chair with use of ue  Goal Status: ongoing 01/05/23 PLAN:  PT FREQUENCY: 1-2x/week  PT DURATION: 8 weeks (allowing for potential scheduling difficulties)  PLANNED INTERVENTIONS: Therapeutic exercises, Therapeutic activity, Neuromuscular re-education, Balance training, Gait training, Patient/Family education, Self Care, Joint mobilization, DME instructions, Aquatic Therapy, Dry Needling, Electrical stimulation, Cryotherapy, Moist heat, and Manual therapy  PLAN FOR NEXT SESSION:  Continue balance and strengthening on land; create HEP for land.  Has 3 more visits.   Sherlynn Carbon, PTA  01/28/23 11:58 AM [facility] [address] [place],  [place], [address] Phone: [phone]   Fax:  [phone]

## 2023-02-04 ENCOUNTER — Ambulatory Visit (HOSPITAL_BASED_OUTPATIENT_CLINIC_OR_DEPARTMENT_OTHER): Payer: Medicare Other | Admitting: Physical Therapy

## 2023-02-12 ENCOUNTER — Encounter (HOSPITAL_BASED_OUTPATIENT_CLINIC_OR_DEPARTMENT_OTHER): Payer: Medicare Other

## 2023-02-16 DIAGNOSIS — M4692 Unspecified inflammatory spondylopathy, cervical region: Secondary | ICD-10-CM | POA: Diagnosis not present

## 2023-02-18 ENCOUNTER — Encounter (HOSPITAL_BASED_OUTPATIENT_CLINIC_OR_DEPARTMENT_OTHER): Payer: Medicare Other

## 2023-02-20 ENCOUNTER — Encounter (HOSPITAL_BASED_OUTPATIENT_CLINIC_OR_DEPARTMENT_OTHER): Payer: Self-pay

## 2023-02-20 ENCOUNTER — Ambulatory Visit (HOSPITAL_BASED_OUTPATIENT_CLINIC_OR_DEPARTMENT_OTHER): Payer: Medicare Other

## 2023-02-20 DIAGNOSIS — R296 Repeated falls: Secondary | ICD-10-CM | POA: Diagnosis not present

## 2023-02-20 DIAGNOSIS — R262 Difficulty in walking, not elsewhere classified: Secondary | ICD-10-CM | POA: Diagnosis not present

## 2023-02-20 DIAGNOSIS — M6281 Muscle weakness (generalized): Secondary | ICD-10-CM | POA: Diagnosis not present

## 2023-02-20 NOTE — Therapy (Signed)
OUTPATIENT PHYSICAL THERAPY LOWER EXTREMITY TREATMENT     Patient Name: [name] MRN: [id number] DOB:Sep 10, 1956, 67 y.o., female Today's Date: 02/20/2023  END OF SESSION:  PT End of Session - 02/20/23 1416     Visit Number 15    Number of Visits 18    Date for PT Re-Evaluation 01/28/23    Authorization Type U[employer] MCR    Progress Note Due on Visit 19    PT Start Time 1345    PT Stop Time 1433    PT Time Calculation (min) 48 min    Activity Tolerance Patient tolerated treatment well    Behavior During Therapy WFL for tasks assessed/performed                  Past Medical History:  Diagnosis Date   Arthritis    Atrial fibrillation (HCC)    Chronic lower back pain    Depression    DVT (deep venous thrombosis) (HCC) 1990s   LLE   Dyspnea    Fallen arches    Family history of adverse reaction to anesthesia    Mother had rash and itching after anesthesia   Headache    Hypertension    Hyperthyroidism ~ 2000   "fine now" (04/23/2016)   Joint pain    Lactose intolerance    Leg edema    Obesity    Pneumonia 1980s X 1   Snoring    has not had sleep study but suspects sleep apnea   Past Surgical History:  Procedure Laterality Date   BIOPSY  08/09/2021   Procedure: BIOPSY;  Surgeon: Quentin Ore, MD;  Location: [facility];  Service: General;;   CARDIOVERSION N/A 06/18/2016   Procedure: CARDIOVERSION;  Surgeon: Laurey Morale, MD;  Location: [facility] ENDOSCOPY;  Service: Cardiovascular;  Laterality: N/A;   CARDIOVERSION N/A 07/11/2016   Procedure: CARDIOVERSION;  Surgeon: Lewayne Bunting, MD;  Location: [facility] ENDOSCOPY;  Service: Cardiovascular;  Laterality: N/A;   CARDIOVERSION N/A 07/02/2017   Procedure: CARDIOVERSION;  Surgeon: Thurmon Fair, MD;  Location: [facility] ENDOSCOPY;  Service: Cardiovascular;  Laterality: N/A;   CARDIOVERSION N/A 09/29/2017   Procedure: CARDIOVERSION;  Surgeon: Laurey Morale, MD;  Location: [facility] ENDOSCOPY;  Service: Cardiovascular;   Laterality: N/A;   ENDOMETRIAL ABLATION  ~2006   ESOPHAGOGASTRODUODENOSCOPY N/A 08/09/2021   Procedure: ESOPHAGOGASTRODUODENOSCOPY (EGD);  Surgeon: Quentin Ore, MD;  Location: [facility];  Service: General;  Laterality: N/A;   HIATAL HERNIA REPAIR N/A 10/08/2021   Procedure: HERNIA REPAIR HIATAL;  Surgeon: Berna Bue, MD;  Location: [facility];  Service: General;  Laterality: N/A;   IR GUIDED DRAIN W CATHETER PLACEMENT  10/18/2021   LAPAROSCOPIC GASTRIC SLEEVE RESECTION N/A 10/08/2021   Procedure: LAPAROSCOPIC GASTRIC SLEEVE RESECTION;  Surgeon: Berna Bue, MD;  Location: [facility];  Service: General;  Laterality: N/A;   TONSILLECTOMY  1960s   TUBAL LIGATION  1979   UPPER GI ENDOSCOPY N/A 10/08/2021   Procedure: UPPER GI ENDOSCOPY;  Surgeon: Berna Bue, MD;  Location: [facility];  Service: General;  Laterality: N/A;   Patient Active Problem List   Diagnosis Date Noted   Postoperative hematoma of subcutaneous tissue following non-dermatologic procedure 10/17/2021   Insulin resistance 05/11/2020   DOE (dyspnea on exertion) 02/09/2020   Depression 09/26/2019   Vitamin D deficiency 09/01/2019   Class 3 severe obesity with serious comorbidity and body mass index (BMI) of 50.0 to 59.9 in adult [facility]) 09/01/2019   Prediabetes 08/15/2019  Atrial fibrillation (HCC)    Chest pain 04/22/2016   Hypokalemia 04/22/2016   Morbid obesity (HCC) 04/22/2016   Hypertension 04/22/2016   Paroxysmal A-fib (HCC) 04/22/2016   Hypomagnesemia 04/22/2016    PCP: Laurann Montana MD   REFERRING PROVIDER: Laurann Montana   REFERRING DIAG:R29.6 (ICD-10-CM) - Repeated falls   THERAPY DIAG:  Difficulty in walking, not elsewhere classified  Muscle weakness (generalized)  Repeated falls  Rationale for Evaluation and Treatment: Rehabilitation  ONSET DATE: balance has become worse over the past few months  SUBJECTIVE:   SUBJECTIVE STATEMENT:   Pt reports she feel 3-4 times since her  last PT appt. One was when she tripped over cord of vacuum. Another she tripped over a towel in the bathroom. "I think my shoes get caught on the carpet. She has a varicose vein in R knee which "exploded" when she landed it. But doing better now. It's always the L foot." Will receive [place] boot on 4/30. Saw Dr. Ave Filter who suggested an injection into neck. "My L arm has been bothering me again." "I want a second opinion."   PERTINENT HISTORY: Arthritis, A-fib, DVT. Depression, Dyspnea, headaches, LE edema,  Obesity; gastric bypass,   PAIN:   Are you in pain:  yes NPRS:  3/10 Location: Lt upper arm / Lt thumb Description: achy  PRECAUTIONS: Fall  WEIGHT BEARING RESTRICTIONS: No  FALLS:  Has patient fallen in last 6 months? Yes. Number of falls 3  Last fall: fell backwards int he shower  LIVING ENVIRONMENT: Ramp into the house  OCCUPATION:  On disability   Hobbies:  Traveling   PLOF: Independent and Independent with community mobility with device  PATIENT GOALS:  To have less falls   NEXT MD VISIT: every 2-3 months   OBJECTIVE:   DIAGNOSTIC FINDINGS:  Nothing recent   PATIENT SURVEYS:  FOTO 42 at eval 6th visit 12/24/22:   COGNITION: Overall cognitive status: Within functional limits for tasks assessed     SENSATION: WFL  EDEMA:  Past history of edema but no significant edema at this time   MUSCLE LENGTH:  POSTURE: No Significant postural limitations  PALPATION: No significant tenderness to palpation   LOWER EXTREMITY ROM:  Passive ROM Right eval Left eval  Hip flexion    Hip extension    Hip abduction    Hip adduction    Hip internal rotation    Hip external rotation    Knee flexion    Knee extension    Ankle dorsiflexion    Ankle plantarflexion    Ankle inversion    Ankle eversion     (Blank rows = not tested)  LOWER EXTREMITY MMT:  MMT Right eval Left eval Right / Left 12/03/22 Right/Left  3/13 Right/Left  Hip flexion 27.4 27.1  43.2 / 36.0 29.0/28.5 32.9/29.7  Hip extension       Hip abduction 26.5 30.4 25.7 / 23.6 27.5/29.2 37.6/34.9  Hip adduction       Hip internal rotation       Hip external rotation       Knee flexion       Knee extension 23.0 25.2 38.0 / 31.7 37.6/35.6 43.1/36.5  Ankle dorsiflexion       Ankle plantarflexion       Ankle inversion       Ankle eversion        (Blank rows = not tested)    FUNCTIONAL TESTS:  Sharlene Motts Balance Scale:  BERG Balance Test  Eval   12/03/22  01/05/23  Sit to Stand 3 3 4   Standing unsupported 3 3 4   Sitting with back unsupported but feet supported 4 4 4   Stand to sit  3 3 4   Transfers  4 3 4   Standing unsupported with eyes closed 4 3 3   Standing unsupported feet together 4 3 4   From standing position, reach forward with outstretched arm 3 2 3   From standing position, pick up object from floor 4 3 4   From standing position, turn and look behind over each shoulder 4 3 4   Turn 360 2 2 3   Standing unsupported, alternately place foot on step 3 2 2   Standing unsupported, one foot in front 0 0 0  Standing on one leg 0 0 1  Total:  41 34/56 44/56    12/03/22 5X STS test: 22.19 from armed chair with use of UE TUG: 18.99 using cane  12/24/20 TUG:14s without AD  01/05/23 Berg 44/56 TUG 11.36  01/07/23: 5xSTS 17sec- no UE use  02/20/23: 5xSTS 15.67sec- No UE use    GAIT: Uses cane on R side as instructed.    TODAY'S TREATMENT:                   4/26  -Nu-step L3 Gait with AD - hall x2 -Modified SLS 30sec x3 ea -Gait with heel contact cues Gait with postural cues -Sidelying clam 2x10bil -Long sit HSS -Supine HSS with strap L 30secx3 -Modified HR/TR -Lateral wgt shifts (some HS discomfort) -L hip PROM  4/3:  -Nu-step L3 Gait with AD - hall x2 -Sidelying clam 2x10bil -Long sit HSS -Supine HSS with strap L 30secx3 -Modified HR/TR -Lateral wgt shifts (some HS discomfort) -L hip PROM   PATIENT EDUCATION:  Education  details: aquatic rehab review and progressions Person educated: Patient Education method: Explanation, Demonstration, Tactile cues, and Verbal cues Education comprehension: verbalized understanding, returned demonstration, verbal cues required, tactile cues required, and needs further education  HOME EXERCISE PROGRAM: AQUATIC Access Code: [email or web address] URL: [email or web address] Date issued: 01/12/23 with modifications This aquatic home exercise program from MedBridge utilizes pictures from land based exercises, but has been adapted prior to lamination and issuance.   ASSESSMENT:  CLINICAL IMPRESSION: Pt has attended 15 visits thus far and is making steady progress. Recent falls are likely due to poor L foot placement with gait as she tends to walk with a foot slap pattern. Worked on gait training with focus on heel contact which pt was able to complete with great performance. Instructed her how to practice this at home.  Pt continues to demonstrate trunk lean with ambulating as well as fwd flexed posture with ambulation. Improved posture with cues for gait, but does continue to rotate upper trunk. Educated pt on fall prevention strategies such as de cluttering and having proper lighting throughout the house. Pt will benefit from continued PT to improve function and decrease fall risk.   OBJECTIVE IMPAIRMENTS: Abnormal gait, decreased activity tolerance, decreased balance, decreased endurance, decreased mobility, difficulty walking, decreased ROM, and decreased strength.   ACTIVITY LIMITATIONS: lifting, bending, standing, squatting, stairs, and locomotion level  PARTICIPATION LIMITATIONS: meal prep, cleaning, laundry, driving, shopping, occupation, and yard work  PERSONAL FACTORS: 1-2 comorbidities: left foot surgery; low back pain  are also affecting patient's functional outcome.   REHAB POTENTIAL: Good  CLINICAL DECISION MAKING: Evolving/moderate complexity  EVALUATION  COMPLEXITY: Low   GOALS: Goals reviewed with patient? Yes  SHORT TERM GOALS: Target date:  12/31/2022   Patient will be independent with basic balance and strength HEP  Baseline: Goal status: MET 01/28/23  2.  Patient will increase BERG score by 4 points  Baseline: 33/56 Goal status: Met 01/05/23 (44/56)  3.  Patient will increase gross bilateral LE strength by 5 lbs  Baseline: see updated chart Goal status: IN PROGRESS   LONG TERM GOALS: Target date: 01/28/24/2024    Patient will have no falls over a 2 weeks period prior to discharge  Baseline: multiple Goal status: IN PROGRESS 4/26 (3-4 falls reported)  2.  Patient will ambulate 3000' without loss of balance or pain in her left shoulder  Baseline:  Goal status: Met 01/05/23  3.  Patient will have complete balance and strengthening plan  Baseline: 01/12/23 has aquatic HEP Goal status: Partially met    4. Pt will improve on Tug to < 13s to demonstrate improved mobility   Baseline: 18.99 Goal Status: Met 01/05/23  (11.36)  5. Pt will improve on 5 X STS test to <15s to demonstrate improved strength  Baseline: 22.19 from armed chair with use of ue  Goal Status: ongoing 02/20/23 (15.67) PLAN:  PT FREQUENCY: 1-2x/week  PT DURATION: 8 weeks (allowing for potential scheduling difficulties)  PLANNED INTERVENTIONS: Therapeutic exercises, Therapeutic activity, Neuromuscular re-education, Balance training, Gait training, Patient/Family education, Self Care, Joint mobilization, DME instructions, Aquatic Therapy, Dry Needling, Electrical stimulation, Cryotherapy, Moist heat, and Manual therapy  PLAN FOR NEXT SESSION:  Continue balance and strengthening on land; create HEP for land.  Has 3 more visits.   Riki Altes, PTA  02/20/23 2:50 PM [facility] [address] [place], [place], [address] Phone: [phone]   Fax:  [phone]

## 2023-02-24 DIAGNOSIS — Z981 Arthrodesis status: Secondary | ICD-10-CM | POA: Diagnosis not present

## 2023-02-24 DIAGNOSIS — Q6651 Congenital pes planus, right foot: Secondary | ICD-10-CM | POA: Diagnosis not present

## 2023-02-24 NOTE — Addendum Note (Signed)
Addended by: Dessie Coma on: 02/24/2023 05:22 PM   Modules accepted: Orders

## 2023-03-07 ENCOUNTER — Ambulatory Visit (HOSPITAL_BASED_OUTPATIENT_CLINIC_OR_DEPARTMENT_OTHER): Payer: Medicare Other | Attending: Family Medicine

## 2023-03-07 ENCOUNTER — Encounter (HOSPITAL_BASED_OUTPATIENT_CLINIC_OR_DEPARTMENT_OTHER): Payer: Self-pay

## 2023-03-07 DIAGNOSIS — M6281 Muscle weakness (generalized): Secondary | ICD-10-CM | POA: Insufficient documentation

## 2023-03-07 DIAGNOSIS — R296 Repeated falls: Secondary | ICD-10-CM | POA: Diagnosis not present

## 2023-03-07 DIAGNOSIS — R262 Difficulty in walking, not elsewhere classified: Secondary | ICD-10-CM | POA: Diagnosis not present

## 2023-03-07 NOTE — Therapy (Signed)
OUTPATIENT PHYSICAL THERAPY LOWER EXTREMITY TREATMENT     Patient Name: [name] MRN: [id number] DOB:[birth year], 67 y.o., female Today's Date: 03/07/2023  END OF SESSION:  PT End of Session - 03/07/23 0955     Visit Number 16    Number of Visits 30    Date for PT Re-Evaluation 04/07/23    Authorization Type UHC MCR    Progress Note Due on Visit 19    PT Start Time 0956    PT Stop Time 1045    PT Time Calculation (min) 49 min    Activity Tolerance Patient tolerated treatment well    Behavior During Therapy WFL for tasks assessed/performed             Past Medical History:  Diagnosis Date   Arthritis    Atrial fibrillation (HCC)    Chronic lower back pain    Depression    DVT (deep venous thrombosis) (HCC) 1990s   LLE   Dyspnea    Fallen arches    Family history of adverse reaction to anesthesia    Mother had rash and itching after anesthesia   Headache    Hypertension    Hyperthyroidism ~ 2000   "fine now" (04/23/2016)   Joint pain    Lactose intolerance    Leg edema    Obesity    Pneumonia 1980s X 1   Snoring    has not had sleep study but suspects sleep apnea   Past Surgical History:  Procedure Laterality Date   BIOPSY  08/09/2021   Procedure: BIOPSY;  Surgeon: Quentin Ore, MD;  Location: [facility];  Service: General;;   CARDIOVERSION N/A 06/18/2016   Procedure: CARDIOVERSION;  Surgeon: Laurey Morale, MD;  Location: [facility] ENDOSCOPY;  Service: Cardiovascular;  Laterality: N/A;   CARDIOVERSION N/A 07/11/2016   Procedure: CARDIOVERSION;  Surgeon: Lewayne Bunting, MD;  Location: [facility] ENDOSCOPY;  Service: Cardiovascular;  Laterality: N/A;   CARDIOVERSION N/A 07/02/2017   Procedure: CARDIOVERSION;  Surgeon: Thurmon Fair, MD;  Location: [facility] ENDOSCOPY;  Service: Cardiovascular;  Laterality: N/A;   CARDIOVERSION N/A 09/29/2017   Procedure: CARDIOVERSION;  Surgeon: Laurey Morale, MD;  Location: [facility] ENDOSCOPY;  Service: Cardiovascular;   Laterality: N/A;   ENDOMETRIAL ABLATION  ~2006   ESOPHAGOGASTRODUODENOSCOPY N/A 08/09/2021   Procedure: ESOPHAGOGASTRODUODENOSCOPY (EGD);  Surgeon: Quentin Ore, MD;  Location: [facility];  Service: General;  Laterality: N/A;   HIATAL HERNIA REPAIR N/A 10/08/2021   Procedure: HERNIA REPAIR HIATAL;  Surgeon: Berna Bue, MD;  Location: [facility];  Service: General;  Laterality: N/A;   IR GUIDED DRAIN W CATHETER PLACEMENT  10/18/2021   LAPAROSCOPIC GASTRIC SLEEVE RESECTION N/A 10/08/2021   Procedure: LAPAROSCOPIC GASTRIC SLEEVE RESECTION;  Surgeon: Berna Bue, MD;  Location: [facility];  Service: General;  Laterality: N/A;   TONSILLECTOMY  1960s   TUBAL LIGATION  1979   UPPER GI ENDOSCOPY N/A 10/08/2021   Procedure: UPPER GI ENDOSCOPY;  Surgeon: Berna Bue, MD;  Location: [facility];  Service: General;  Laterality: N/A;   Patient Active Problem List   Diagnosis Date Noted   Postoperative hematoma of subcutaneous tissue following non-dermatologic procedure 10/17/2021   Insulin resistance 05/11/2020   DOE (dyspnea on exertion) 02/09/2020   Depression 09/26/2019   Vitamin D deficiency 09/01/2019   Class 3 severe obesity with serious comorbidity and body mass index (BMI) of 50.0 to 59.9 in adult [facility]) 09/01/2019   Prediabetes 08/15/2019   Atrial fibrillation (HCC)  Chest pain 04/22/2016   Hypokalemia 04/22/2016   Morbid obesity (HCC) 04/22/2016   Hypertension 04/22/2016   Paroxysmal A-fib (HCC) 04/22/2016   Hypomagnesemia 04/22/2016    PCP: Laurann Montana MD   REFERRING PROVIDER: Laurann Montana   REFERRING DIAG:R29.6 (ICD-10-CM) - Repeated falls   THERAPY DIAG:  Difficulty in walking, not elsewhere classified  Muscle weakness (generalized)  Repeated falls  Rationale for Evaluation and Treatment: Rehabilitation  ONSET DATE: balance has become worse over the past few months  SUBJECTIVE:   SUBJECTIVE STATEMENT:   No falls since last visit. Received  [employer] for R foot, but it has not been working well. Has new orthotic for L foot but they did not make a new one for her R foot. Some R sided back pain/hip pain today which pt relates to old back injury.    PERTINENT HISTORY: Arthritis, A-fib, DVT. Depression, Dyspnea, headaches, LE edema,  Obesity; gastric bypass,   PAIN:   Are you in pain:  yes NPRS:  3/10 Location: Lt upper arm / Lt thumb Description: achy  PRECAUTIONS: Fall  WEIGHT BEARING RESTRICTIONS: No  FALLS:  Has patient fallen in last 6 months? Yes. Number of falls 3  Last fall: fell backwards int he shower  LIVING ENVIRONMENT: Ramp into the house  OCCUPATION:  On disability   Hobbies:  Traveling   PLOF: Independent and Independent with community mobility with device  PATIENT GOALS:  To have less falls   NEXT MD VISIT: every 2-3 months   OBJECTIVE:   DIAGNOSTIC FINDINGS:  Nothing recent   PATIENT SURVEYS:  FOTO 42 at eval 6th visit 12/24/22:   COGNITION: Overall cognitive status: Within functional limits for tasks assessed     SENSATION: WFL  EDEMA:  Past history of edema but no significant edema at this time   MUSCLE LENGTH:  POSTURE: No Significant postural limitations  PALPATION: No significant tenderness to palpation   LOWER EXTREMITY ROM:  Passive ROM Right eval Left eval  Hip flexion    Hip extension    Hip abduction    Hip adduction    Hip internal rotation    Hip external rotation    Knee flexion    Knee extension    Ankle dorsiflexion    Ankle plantarflexion    Ankle inversion    Ankle eversion     (Blank rows = not tested)  LOWER EXTREMITY MMT:  MMT Right eval Left eval Right / Left 12/03/22 Right/Left  3/13 Right/Left  Hip flexion 27.4 27.1 43.2 / 36.0 29.0/28.5 32.9/29.7  Hip extension       Hip abduction 26.5 30.4 25.7 / 23.6 27.5/29.2 37.6/34.9  Hip adduction       Hip internal rotation       Hip external rotation       Knee flexion       Knee  extension 23.0 25.2 38.0 / 31.7 37.6/35.6 43.1/36.5  Ankle dorsiflexion       Ankle plantarflexion       Ankle inversion       Ankle eversion        (Blank rows = not tested)    FUNCTIONAL TESTS:  Berg Balance Scale:  BERG Balance Test          Eval   12/03/22  01/05/23  Sit to Stand 3 3 4   Standing unsupported 3 3 4   Sitting with back unsupported but feet supported 4 4 4   Stand to sit  3 3  4  Transfers  4 3 4   Standing unsupported with eyes closed 4 3 3   Standing unsupported feet together 4 3 4   From standing position, reach forward with outstretched arm 3 2 3   From standing position, pick up object from floor 4 3 4   From standing position, turn and look behind over each shoulder 4 3 4   Turn 360 2 2 3   Standing unsupported, alternately place foot on step 3 2 2   Standing unsupported, one foot in front 0 0 0  Standing on one leg 0 0 1  Total:  41 34/56 44/56    12/03/22 5X STS test: 22.19 from armed chair with use of UE TUG: 18.99 using cane  12/24/20 TUG:14s without AD  01/05/23 Berg 44/56 TUG 11.36  01/07/23: 5xSTS 17sec- no UE use  02/20/23: 5xSTS 15.67sec- No UE use    GAIT: Uses cane on R side as instructed.    TODAY'S TREATMENT:                  5/11  -Nu-step L3 Gait with AD - hall x2 -Modified SLS 30sec x3 ea -Gait with heel contact cues Gait with postural cues -Sidelying clam 3x10bil -Long sit HSS 30sec x2ea  -Lateral wgt shifts (some HS discomfort) -L hip PROM -Standing hip abduction 2x10ea (cues for posture) -Standing hip extension 2x10ea (cues for posture)  4/26  -Nu-step L3 Gait with AD - hall x2 -Modified SLS 30sec x3 ea -Gait with heel contact cues Gait with postural cues -Sidelying clam 2x10bil -Long sit HSS -Supine HSS with strap L 30secx3 -Modified HR/TR -Lateral wgt shifts (some HS discomfort) -L hip PROM   PATIENT EDUCATION:  Education details: aquatic rehab review and progressions Person educated:  Patient Education method: Explanation, Demonstration, Tactile cues, and Verbal cues Education comprehension: verbalized understanding, returned demonstration, verbal cues required, tactile cues required, and needs further education  HOME EXERCISE PROGRAM: AQUATIC Access Code: [email or web address] URL: [email or web address] Date issued: 01/12/23 with modifications This aquatic home exercise program from [employer] utilizes pictures from land based exercises, but has been adapted prior to lamination and issuance.   ASSESSMENT:  CLINICAL IMPRESSION: Pt demonstrates improved quality of gait this session and has improved heel contact with decrease foot slapping. Improved posture with gait as well. Continued to work on glute [employer] which is improving. Educated pt about other ways to improve postural awareness and core activation. Instructed her to bring [place] boot next visit to practice gait training and ensure her balance is safe. Updated HEP.  OBJECTIVE IMPAIRMENTS: Abnormal gait, decreased activity tolerance, decreased balance, decreased endurance, decreased mobility, difficulty walking, decreased ROM, and decreased strength.   ACTIVITY LIMITATIONS: lifting, bending, standing, squatting, stairs, and locomotion level  PARTICIPATION LIMITATIONS: meal prep, cleaning, laundry, driving, shopping, occupation, and yard work  PERSONAL FACTORS: 1-2 comorbidities: left foot surgery; low back pain  are also affecting patient's functional outcome.   REHAB POTENTIAL: Good  CLINICAL DECISION MAKING: Evolving/moderate complexity  EVALUATION COMPLEXITY: Low   GOALS: Goals reviewed with patient? Yes  SHORT TERM GOALS: Target date: 12/31/2022   Patient will be independent with basic balance and strength HEP  Baseline: Goal status: MET 01/28/23  2.  Patient will increase BERG score by 4 points  Baseline: 33/56 Goal status: Met 01/05/23 (44/56)  3.  Patient will increase gross  bilateral LE strength by 5 lbs  Baseline: see updated chart Goal status: IN PROGRESS   LONG TERM GOALS: Target date: 01/28/24/2024  Patient will have no falls over a 2 weeks period prior to discharge  Baseline: multiple Goal status: IN PROGRESS 4/26 (3-4 falls reported)  2.  Patient will ambulate 3000' without loss of balance or pain in her left shoulder  Baseline:  Goal status: Met 01/05/23  3.  Patient will have complete balance and strengthening plan  Baseline: 01/12/23 has aquatic HEP Goal status: Partially met    4. Pt will improve on Tug to < 13s to demonstrate improved mobility   Baseline: 18.99 Goal Status: Met 01/05/23  (11.36)  5. Pt will improve on 5 X STS test to <15s to demonstrate improved strength  Baseline: 22.19 from armed chair with use of ue  Goal Status: ongoing 02/20/23 (15.67) PLAN:  PT FREQUENCY: 1-2x/week  PT DURATION: 8 weeks (allowing for potential scheduling difficulties)  PLANNED INTERVENTIONS: Therapeutic exercises, Therapeutic activity, Neuromuscular re-education, Balance training, Gait training, Patient/Family education, Self Care, Joint mobilization, DME instructions, Aquatic Therapy, Dry Needling, Electrical stimulation, Cryotherapy, Moist heat, and Manual therapy  PLAN FOR NEXT SESSION:  Continue balance and strengthening on land; create HEP for land.  Has 3 more visits.   Riki Altes, PTA  03/07/23 11:57 AM

## 2023-03-10 ENCOUNTER — Other Ambulatory Visit: Payer: Self-pay | Admitting: Family Medicine

## 2023-03-10 DIAGNOSIS — I77810 Thoracic aortic ectasia: Secondary | ICD-10-CM | POA: Diagnosis not present

## 2023-03-10 DIAGNOSIS — Z9181 History of falling: Secondary | ICD-10-CM | POA: Diagnosis not present

## 2023-03-10 DIAGNOSIS — Z1211 Encounter for screening for malignant neoplasm of colon: Secondary | ICD-10-CM | POA: Diagnosis not present

## 2023-03-10 DIAGNOSIS — I700 Atherosclerosis of aorta: Secondary | ICD-10-CM | POA: Diagnosis not present

## 2023-03-10 DIAGNOSIS — D6869 Other thrombophilia: Secondary | ICD-10-CM | POA: Diagnosis not present

## 2023-03-10 DIAGNOSIS — I10 Essential (primary) hypertension: Secondary | ICD-10-CM | POA: Diagnosis not present

## 2023-03-13 ENCOUNTER — Encounter (HOSPITAL_BASED_OUTPATIENT_CLINIC_OR_DEPARTMENT_OTHER): Payer: Medicare Other

## 2023-03-20 ENCOUNTER — Ambulatory Visit (HOSPITAL_BASED_OUTPATIENT_CLINIC_OR_DEPARTMENT_OTHER): Payer: Medicare Other

## 2023-03-20 ENCOUNTER — Encounter (HOSPITAL_BASED_OUTPATIENT_CLINIC_OR_DEPARTMENT_OTHER): Payer: Self-pay

## 2023-03-20 DIAGNOSIS — R262 Difficulty in walking, not elsewhere classified: Secondary | ICD-10-CM | POA: Diagnosis not present

## 2023-03-20 DIAGNOSIS — R296 Repeated falls: Secondary | ICD-10-CM

## 2023-03-20 DIAGNOSIS — M6281 Muscle weakness (generalized): Secondary | ICD-10-CM

## 2023-03-20 NOTE — Therapy (Addendum)
OUTPATIENT PHYSICAL THERAPY LOWER EXTREMITY TREATMENT PHYSICAL THERAPY DISCHARGE SUMMARY  Visits from Start of Care: 17  Current functional level related to goals / functional outcomes: unknown   Remaining deficits: unknown   Education / Equipment: Management of condition/HEP   Patient agrees to discharge. Patient goals were partially met. Patient is being discharged due to not returning since the last visit.  Addend Corrie Dandy Tomma Koch) [name] 11/25/23 3:55 PM [facility] [address] [place], [place], [address] Phone: [phone]   Fax:  [phone]       Patient Name: [name] MRN: [id number] DOB:November 14, 1955, 67 y.o., female Today's Date: 03/20/2023  END OF SESSION:  PT End of Session - 03/20/23 0942     Visit Number 17    Number of Visits 30    Date for PT Re-Evaluation 04/07/23    Authorization Type UHC MCR    Progress Note Due on Visit 19    PT Start Time 0934    PT Stop Time 1014    PT Time Calculation (min) 40 min    Activity Tolerance Patient tolerated treatment well    Behavior During Therapy WFL for tasks assessed/performed             Past Medical History:  Diagnosis Date   Arthritis    Atrial fibrillation (HCC)    Chronic lower back pain    Depression    DVT (deep venous thrombosis) (HCC) 1990s   LLE   Dyspnea    Fallen arches    Family history of adverse reaction to anesthesia    Mother had rash and itching after anesthesia   Headache    Hypertension    Hyperthyroidism ~ 2000   "fine now" (04/23/2016)   Joint pain    Lactose intolerance    Leg edema    Obesity    Pneumonia 1980s X 1   Snoring    has not had sleep study but suspects sleep apnea   Past Surgical History:  Procedure Laterality Date   BIOPSY  08/09/2021   Procedure: BIOPSY;  Surgeon: Quentin Ore, MD;  Location: [facility];  Service: General;;   CARDIOVERSION N/A 06/18/2016   Procedure:  CARDIOVERSION;  Surgeon: Laurey Morale, MD;  Location: [facility] ENDOSCOPY;  Service: Cardiovascular;  Laterality: N/A;   CARDIOVERSION N/A 07/11/2016   Procedure: CARDIOVERSION;  Surgeon: Lewayne Bunting, MD;  Location: [facility] ENDOSCOPY;  Service: Cardiovascular;  Laterality: N/A;   CARDIOVERSION N/A 07/02/2017   Procedure: CARDIOVERSION;  Surgeon: Thurmon Fair, MD;  Location: [facility] ENDOSCOPY;  Service: Cardiovascular;  Laterality: N/A;   CARDIOVERSION N/A 09/29/2017   Procedure: CARDIOVERSION;  Surgeon: Laurey Morale, MD;  Location: [facility] ENDOSCOPY;  Service: Cardiovascular;  Laterality: N/A;   ENDOMETRIAL ABLATION  ~2006   ESOPHAGOGASTRODUODENOSCOPY N/A 08/09/2021   Procedure: ESOPHAGOGASTRODUODENOSCOPY (EGD);  Surgeon: Quentin Ore, MD;  Location: [facility];  Service: General;  Laterality: N/A;   HIATAL HERNIA REPAIR N/A 10/08/2021   Procedure: HERNIA REPAIR HIATAL;  Surgeon: Berna Bue, MD;  Location: [facility] ORS;  Service: General;  Laterality: N/A;   IR GUIDED DRAIN W CATHETER PLACEMENT  10/18/2021   LAPAROSCOPIC GASTRIC SLEEVE RESECTION N/A 10/08/2021   Procedure: LAPAROSCOPIC GASTRIC SLEEVE RESECTION;  Surgeon: Berna Bue, MD;  Location: [facility];  Service: General;  Laterality: N/A;   TONSILLECTOMY  1960s   TUBAL LIGATION  1979   UPPER GI ENDOSCOPY N/A 10/08/2021   Procedure: UPPER GI ENDOSCOPY;  Surgeon:  Berna Bue, MD;  Location: [facility];  Service: General;  Laterality: N/A;   Patient Active Problem List   Diagnosis Date Noted   Postoperative hematoma of subcutaneous tissue following non-dermatologic procedure 10/17/2021   Insulin resistance 05/11/2020   DOE (dyspnea on exertion) 02/09/2020   Depression 09/26/2019   Vitamin D deficiency 09/01/2019   Class 3 severe obesity with serious comorbidity and body mass index (BMI) of 50.0 to 59.9 in adult [facility]) 09/01/2019   Prediabetes 08/15/2019   Atrial fibrillation (HCC)    Chest pain 04/22/2016   Hypokalemia  04/22/2016   Morbid obesity (HCC) 04/22/2016   Hypertension 04/22/2016   Paroxysmal A-fib (HCC) 04/22/2016   Hypomagnesemia 04/22/2016    PCP: Laurann Montana MD   REFERRING PROVIDER: Laurann Montana   REFERRING DIAG:R29.6 (ICD-10-CM) - Repeated falls   THERAPY DIAG:  Difficulty in walking, not elsewhere classified  Repeated falls  Muscle weakness (generalized)  Rationale for Evaluation and Treatment: Rehabilitation  ONSET DATE: balance has become worse over the past few months  SUBJECTIVE:   SUBJECTIVE STATEMENT:   Pt reports she forgot to bring [place] boot with her today. Reports she is able to vacuum now and can walk longer distances without seated breaks. She questions if she can join [employer] to begin a gym program.    PERTINENT HISTORY: Arthritis, A-fib, DVT. Depression, Dyspnea, headaches, LE edema,  Obesity; gastric bypass,   PAIN:   Are you in pain:  yes NPRS:  3/10 Location: Lt upper arm / Lt thumb Description: achy  PRECAUTIONS: Fall  WEIGHT BEARING RESTRICTIONS: No  FALLS:  Has patient fallen in last 6 months? Yes. Number of falls 3  Last fall: fell backwards int he shower  LIVING ENVIRONMENT: Ramp into the house  OCCUPATION:  On disability   Hobbies:  Traveling   PLOF: Independent and Independent with community mobility with device  PATIENT GOALS:  To have less falls   NEXT MD VISIT: every 2-3 months   OBJECTIVE:   DIAGNOSTIC FINDINGS:  Nothing recent   PATIENT SURVEYS:  FOTO 42 at eval 6th visit 12/24/22:   COGNITION: Overall cognitive status: Within functional limits for tasks assessed     SENSATION: WFL  EDEMA:  Past history of edema but no significant edema at this time   MUSCLE LENGTH:  POSTURE: No Significant postural limitations  PALPATION: No significant tenderness to palpation   LOWER EXTREMITY ROM:  Passive ROM Right eval Left eval  Hip flexion    Hip extension    Hip abduction    Hip adduction    Hip  internal rotation    Hip external rotation    Knee flexion    Knee extension    Ankle dorsiflexion    Ankle plantarflexion    Ankle inversion    Ankle eversion     (Blank rows = not tested)  LOWER EXTREMITY MMT:  MMT Right eval Left eval Right / Left 12/03/22 Right/Left  3/13 Right/Left  Hip flexion 27.4 27.1 43.2 / 36.0 29.0/28.5 32.9/29.7  Hip extension       Hip abduction 26.5 30.4 25.7 / 23.6 27.5/29.2 37.6/34.9  Hip adduction       Hip internal rotation       Hip external rotation       Knee flexion       Knee extension 23.0 25.2 38.0 / 31.7 37.6/35.6 43.1/36.5  Ankle dorsiflexion       Ankle plantarflexion       Ankle  inversion       Ankle eversion        (Blank rows = not tested)    FUNCTIONAL TESTS:  Berg Balance Scale:  BERG Balance Test          Eval   12/03/22  01/05/23  Sit to Stand 3 3 4   Standing unsupported 3 3 4   Sitting with back unsupported but feet supported 4 4 4   Stand to sit  3 3 4   Transfers  4 3 4   Standing unsupported with eyes closed 4 3 3   Standing unsupported feet together 4 3 4   From standing position, reach forward with outstretched arm 3 2 3   From standing position, pick up object from floor 4 3 4   From standing position, turn and look behind over each shoulder 4 3 4   Turn 360 2 2 3   Standing unsupported, alternately place foot on step 3 2 2   Standing unsupported, one foot in front 0 0 0  Standing on one leg 0 0 1  Total:  41 34/56 44/56    12/03/22 5X STS test: 22.19 from armed chair with use of UE TUG: 18.99 using cane  12/24/20 TUG:14s without AD  01/05/23 Berg 44/56 TUG 11.36  01/07/23: 5xSTS 17sec- no UE use  02/20/23: 5xSTS 15.67sec- No UE use    GAIT: Uses cane on R side as instructed.    TODAY'S TREATMENT:                 5/24  -Nu-step L4 Gait with AD - hall x2 -Modified SLS 30sec x3 ea Gait with postural cues hall x2, 300 ft lap with SPC -Sidelying clam 3x10bil -Long sit HSS 30sec x2ea  -Standing  hip abduction 2x10ea (cues for posture) -Standing hip extension 2x10ea (cues for posture)   5/11  -Nu-step L3 Gait with AD - hall x2 -Modified SLS 30sec x3 ea -Gait with heel contact cues Gait with postural cues -Sidelying clam 3x10bil -Long sit HSS 30sec x2ea  -Lateral wgt shifts (some HS discomfort) -L hip PROM -Standing hip abduction 2x10ea (cues for posture) -Standing hip extension 2x10ea (cues for posture)  4/26  -Nu-step L3 Gait with AD - hall x2 -Modified SLS 30sec x3 ea -Gait with heel contact cues Gait with postural cues -Sidelying clam 2x10bil -Long sit HSS -Supine HSS with strap L 30secx3 -Modified HR/TR -Lateral wgt shifts (some HS discomfort) -L hip PROM   PATIENT EDUCATION:  Education details: aquatic rehab review and progressions Person educated: Patient Education method: Explanation, Demonstration, Tactile cues, and Verbal cues Education comprehension: verbalized understanding, returned demonstration, verbal cues required, tactile cues required, and needs further education  HOME EXERCISE PROGRAM: AQUATIC Access Code: [email or web address] URL: [email or web address] Date issued: 01/12/23 with modifications This aquatic home exercise program from [employer] utilizes pictures from land based exercises, but has been adapted prior to lamination and issuance.   ASSESSMENT:  CLINICAL IMPRESSION: Pt was able to ambulate for a longer distance today, though fatigue is observed through trunk leaning and incidence of foot slapping towards end of duration. Discussed transition to gym program with pt and will collaborate with fitness specialists once pt joins. Will continue with POC for PT until transition.   OBJECTIVE IMPAIRMENTS: Abnormal gait, decreased activity tolerance, decreased balance, decreased endurance, decreased mobility, difficulty walking, decreased ROM, and decreased strength.   ACTIVITY LIMITATIONS: lifting, bending, standing, squatting,  stairs, and locomotion level  PARTICIPATION LIMITATIONS: meal prep, cleaning, laundry, driving, shopping, occupation, and yard work  PERSONAL FACTORS: 1-2 comorbidities: left foot surgery; low back pain  are also affecting patient's functional outcome.   REHAB POTENTIAL: Good  CLINICAL DECISION MAKING: Evolving/moderate complexity  EVALUATION COMPLEXITY: Low   GOALS: Goals reviewed with patient? Yes  SHORT TERM GOALS: Target date: 12/31/2022   Patient will be independent with basic balance and strength HEP  Baseline: Goal status: MET 01/28/23  2.  Patient will increase BERG score by 4 points  Baseline: 33/56 Goal status: Met 01/05/23 (44/56)  3.  Patient will increase gross bilateral LE strength by 5 lbs  Baseline: see updated chart Goal status: IN PROGRESS   LONG TERM GOALS: Target date: 01/28/24/2024    Patient will have no falls over a 2 weeks period prior to discharge  Baseline: multiple Goal status: IN PROGRESS 4/26 (3-4 falls reported)  2.  Patient will ambulate 3000' without loss of balance or pain in her left shoulder  Baseline:  Goal status: Met 01/05/23  3.  Patient will have complete balance and strengthening plan  Baseline: 01/12/23 has aquatic HEP Goal status: Partially met    4. Pt will improve on Tug to < 13s to demonstrate improved mobility   Baseline: 18.99 Goal Status: Met 01/05/23  (11.36)  5. Pt will improve on 5 X STS test to <15s to demonstrate improved strength  Baseline: 22.19 from armed chair with use of ue  Goal Status: ongoing 02/20/23 (15.67) PLAN:  PT FREQUENCY: 1-2x/week  PT DURATION: 8 weeks (allowing for potential scheduling difficulties)  PLANNED INTERVENTIONS: Therapeutic exercises, Therapeutic activity, Neuromuscular re-education, Balance training, Gait training, Patient/Family education, Self Care, Joint mobilization, DME instructions, Aquatic Therapy, Dry Needling, Electrical stimulation, Cryotherapy, Moist heat, and Manual  therapy  PLAN FOR NEXT SESSION:  Continue balance and strengthening on land; create HEP for land.  Has 3 more visits.   Riki Altes, PTA  03/20/23 10:43 AM

## 2023-04-13 ENCOUNTER — Ambulatory Visit
Admission: RE | Admit: 2023-04-13 | Discharge: 2023-04-13 | Disposition: A | Discharge status: Home / Self Care | Payer: Medicare Other | Source: Ambulatory Visit | Attending: Family Medicine | Admitting: Family Medicine

## 2023-04-13 DIAGNOSIS — I77810 Thoracic aortic ectasia: Secondary | ICD-10-CM

## 2023-04-13 DIAGNOSIS — I517 Cardiomegaly: Secondary | ICD-10-CM | POA: Diagnosis not present

## 2023-04-13 DIAGNOSIS — K449 Diaphragmatic hernia without obstruction or gangrene: Secondary | ICD-10-CM | POA: Diagnosis not present

## 2023-04-13 DIAGNOSIS — I10 Essential (primary) hypertension: Secondary | ICD-10-CM | POA: Diagnosis not present

## 2023-04-13 DIAGNOSIS — N2889 Other specified disorders of kidney and ureter: Secondary | ICD-10-CM | POA: Diagnosis not present

## 2023-04-13 MED ORDER — IOPAMIDOL (ISOVUE-370) INJECTION 76%
75.0000 mL | Freq: Once | INTRAVENOUS | Status: AC | PRN
  Administered 2023-04-13: 75 mL via INTRAVENOUS

## 2023-04-17 ENCOUNTER — Telehealth: Payer: Self-pay | Admitting: Family

## 2023-04-17 NOTE — Telephone Encounter (Signed)
Pt called in asking if [name], NP can take a look at her recent CT and give her thoughts. She states she was told she has an aortic aneurysm and now the CT from 04/13/23 shows she doesn't. She states she just wants to be 100% sure. Please advise.

## 2023-04-17 NOTE — Telephone Encounter (Signed)
Advised patient Ronn Melena NP out of the office but will forward for when she returns

## 2023-04-17 NOTE — Telephone Encounter (Signed)
Covering [name]'s inbox today. As below, patient inquiring about the disappearance of her thoracic aortic aneurysm. Prior CTs did demonstrate ascending TAA 4.2cm range both in 2022 and 2023, but study 04/13/23 reports normal dimension of the aorta. I will route to Dr. Izora Ribas (patient's primary cardiologist per notes) to see if he is able to take a look and weigh in on discrepancy.

## 2023-04-23 ENCOUNTER — Emergency Department (HOSPITAL_BASED_OUTPATIENT_CLINIC_OR_DEPARTMENT_OTHER)
Admission: EM | Admit: 2023-04-23 | Discharge: 2023-04-24 | Disposition: A | Discharge status: Home / Self Care | Payer: Medicare Other | Attending: Emergency Medicine | Admitting: Emergency Medicine

## 2023-04-23 ENCOUNTER — Emergency Department (HOSPITAL_BASED_OUTPATIENT_CLINIC_OR_DEPARTMENT_OTHER): Payer: Medicare Other

## 2023-04-23 ENCOUNTER — Other Ambulatory Visit: Payer: Self-pay

## 2023-04-23 ENCOUNTER — Encounter (HOSPITAL_BASED_OUTPATIENT_CLINIC_OR_DEPARTMENT_OTHER): Payer: Self-pay

## 2023-04-23 DIAGNOSIS — S0990XA Unspecified injury of head, initial encounter: Secondary | ICD-10-CM | POA: Insufficient documentation

## 2023-04-23 DIAGNOSIS — W2100XA Struck by hit or thrown ball, unspecified type, initial encounter: Secondary | ICD-10-CM | POA: Diagnosis not present

## 2023-04-23 DIAGNOSIS — Z87891 Personal history of nicotine dependence: Secondary | ICD-10-CM | POA: Insufficient documentation

## 2023-04-23 DIAGNOSIS — I10 Essential (primary) hypertension: Secondary | ICD-10-CM | POA: Insufficient documentation

## 2023-04-23 NOTE — ED Triage Notes (Signed)
Pt c/o being hit w "foul ball" when she was looking down. Pt reports some anxiety, nausea after incident. Pt is on blood thinners for afib, compliant. A&O, NAD

## 2023-04-24 DIAGNOSIS — S0990XA Unspecified injury of head, initial encounter: Secondary | ICD-10-CM | POA: Diagnosis not present

## 2023-04-24 MED ORDER — ONDANSETRON 4 MG PO TBDP: 4.0000 mg | ORAL_TABLET | Freq: Three times a day (TID) | ORAL | 0 refills | Status: AC | PRN

## 2023-04-24 NOTE — Telephone Encounter (Signed)
Thanks Dr. Izora Ribas!  [name] - Will you please call [name] and let her know that her ascending aorta was 41mm by Dr. Garnetta Buddy review which is mildly dilated. Previous images 42mm. There can be up to 3 mm variance in images. Overall stable from prior. We continue keeping her BP well controlled to prevent progression.   Alver Sorrow, NP

## 2023-04-24 NOTE — ED Provider Notes (Signed)
[facility] EMERGENCY [facility] Emergency Department Provider Note MRN:  [id number]  Arrival date & time: 04/24/23     Chief Complaint   Head trauma History of Present Illness   [name] is a 67 y.o. year-old female with a history of A-fib presenting to the ED with chief complaint of head trauma.  Struck in the head by a foul ball at a baseball game.  Takes a blood thinner.  Having some headache and nausea since.  No neck pain or back pain, no other injuries.  Review of Systems  A thorough review of systems was obtained and all systems are negative except as noted in the HPI and PMH.   Patient's Health History    Past Medical History:  Diagnosis Date   Arthritis    Atrial fibrillation (HCC)    Chronic lower back pain    Depression    DVT (deep venous thrombosis) (HCC) 1990s   LLE   Dyspnea    Fallen arches    Family history of adverse reaction to anesthesia    Mother had rash and itching after anesthesia   Headache    Hypertension    Hyperthyroidism ~ 2000   "fine now" (04/23/2016)   Joint pain    Lactose intolerance    Leg edema    Obesity    Pneumonia 1980s X 1   Snoring    has not had sleep study but suspects sleep apnea    Past Surgical History:  Procedure Laterality Date   BIOPSY  08/09/2021   Procedure: BIOPSY;  Surgeon: Quentin Ore, MD;  Location: [facility];  Service: General;;   CARDIOVERSION N/A 06/18/2016   Procedure: CARDIOVERSION;  Surgeon: Laurey Morale, MD;  Location: [facility] ENDOSCOPY;  Service: Cardiovascular;  Laterality: N/A;   CARDIOVERSION N/A 07/11/2016   Procedure: CARDIOVERSION;  Surgeon: Lewayne Bunting, MD;  Location: [facility] ENDOSCOPY;  Service: Cardiovascular;  Laterality: N/A;   CARDIOVERSION N/A 07/02/2017   Procedure: CARDIOVERSION;  Surgeon: Thurmon Fair, MD;  Location: [facility] ENDOSCOPY;  Service: Cardiovascular;  Laterality: N/A;   CARDIOVERSION N/A 09/29/2017   Procedure: CARDIOVERSION;  Surgeon: Laurey Morale, MD;   Location: [facility] ENDOSCOPY;  Service: Cardiovascular;  Laterality: N/A;   ENDOMETRIAL ABLATION  ~2006   ESOPHAGOGASTRODUODENOSCOPY N/A 08/09/2021   Procedure: ESOPHAGOGASTRODUODENOSCOPY (EGD);  Surgeon: Quentin Ore, MD;  Location: [facility];  Service: General;  Laterality: N/A;   HIATAL HERNIA REPAIR N/A 10/08/2021   Procedure: HERNIA REPAIR HIATAL;  Surgeon: Berna Bue, MD;  Location: [facility];  Service: General;  Laterality: N/A;   IR GUIDED DRAIN W CATHETER PLACEMENT  10/18/2021   LAPAROSCOPIC GASTRIC SLEEVE RESECTION N/A 10/08/2021   Procedure: LAPAROSCOPIC GASTRIC SLEEVE RESECTION;  Surgeon: Berna Bue, MD;  Location: [facility];  Service: General;  Laterality: N/A;   TONSILLECTOMY  1960s   TUBAL LIGATION  1979   UPPER GI ENDOSCOPY N/A 10/08/2021   Procedure: UPPER GI ENDOSCOPY;  Surgeon: Berna Bue, MD;  Location: [facility];  Service: General;  Laterality: N/A;    Family History  Problem Relation Age of Onset   Diabetes Mellitus I Father    CVA Father    Arthritis Mother    Obesity Mother    Thyroid disease Mother    Liver disease Mother    CAD Maternal Grandfather     Social History   Socioeconomic History   Marital status: Married    Spouse name: TERRY   Number of children: 2  Years of education: Not on file   Highest education level: Not on file  Occupational History   Occupation: Disabled  Tobacco Use   Smoking status: Former    Packs/day: 1.00    Years: 10.00    Additional pack years: 0.00    Total pack years: 10.00    Types: Cigarettes    Quit date: 10/28/1987    Years since quitting: 35.5   Smokeless tobacco: Never   Tobacco comments:    Former smoker 11/13/2021  Vaping Use   Vaping Use: Never used  Substance and Sexual Activity   Alcohol use: Not Currently    Alcohol/week: 4.0 standard drinks of alcohol    Types: 4 Glasses of wine per week    Comment: recently quit   Drug use: Not Currently    Types: Marijuana    Comment:  04/23/2016 "recreational marijuana in my late teens/early 20's"   Sexual activity: Not Currently    Birth control/protection: Post-menopausal  Other Topics Concern   Not on file  Social History Narrative   Lives in [place] with husband and 3 grandchildren.   Self employed office cleaner.   Social Determinants of Health   Financial Resource Strain: Not on file  Food Insecurity: Not on file  Transportation Needs: Not on file  Physical Activity: Not on file  Stress: Not on file  Social Connections: Not on file  Intimate Partner Violence: Not on file     Physical Exam   Vitals:   04/23/23 2128  BP: 120/71  Pulse: (!) 54  Resp: 16  Temp: 98.1 F (36.7 C)  SpO2: 96%    CONSTITUTIONAL: Well-appearing, NAD NEURO/PSYCH:  Alert and oriented x 3, no focal deficits EYES:  eyes equal and reactive ENT/NECK:  no LAD, no JVD CARDIO: Regular rate, well-perfused, normal S1 and S2 PULM:  CTAB no wheezing or rhonchi GI/GU:  non-distended, non-tender MSK/SPINE:  No gross deformities, no edema SKIN:  no rash, atraumatic   *Additional and/or pertinent findings included in MDM below  Diagnostic and Interventional Summary    EKG Interpretation Date/Time:  Thursday April 23 2023 21:34:50 EDT Ventricular Rate:  62 PR Interval:    QRS Duration:  90 QT Interval:  390 QTC Calculation: 395 R Axis:   77  Text Interpretation: Atrial fibrillation Septal infarct , age undetermined Abnormal ECG When compared with ECG of 13-Nov-2021 11:19, Vent. rate has decreased BY  48 BPM Septal infarct is now Present Confirmed by Kennis Carina [phone]) on 04/23/2023 11:51:44 PM       Labs Reviewed - No data to display  CT Head Wo Contrast  Final Result      Medications - No data to display   Procedures  /  Critical Care Procedures  ED Course and Medical Decision Making  Initial Impression and Ddx Soft tissue swelling to the crown of the head, no bleeding.  Will need CT scan to exclude  intracranial bleeding.  Concussion is also possible.  No other signs of trauma.  Past medical/surgical history that increases complexity of ED encounter:    Interpretation of Diagnostics CT head is normal  Patient Reassessment and Ultimate Disposition/Management     Discharge.  Patient management required discussion with the following services or consulting groups:  None  Complexity of Problems Addressed Acute illness or injury that poses threat of life of bodily function  Additional Data Reviewed and Analyzed Further history obtained from: Further history from spouse/family member  Additional Factors Impacting ED Encounter Risk  Prescriptions  Elmer Sow. Pilar Plate, MD [facility] Emergency Medicine [facility] [email or web address]  Final Clinical Impressions(s) / ED Diagnoses     ICD-10-CM   1. Traumatic injury of head, initial encounter  S09.90XA       ED Discharge Orders          Ordered    ondansetron (ZOFRAN-ODT) 4 MG disintegrating tablet  Every 8 hours PRN        04/24/23 0026             Discharge Instructions Discussed with and Provided to Patient:    Discharge Instructions      You were evaluated in the Emergency Department and after careful evaluation, we did not find any emergent condition requiring admission or further testing in the hospital.  Your exam/testing today is overall reassuring.  CT did not show any significant injuries.  Recommend Tylenol at home for any discomfort.  Can use the Zofran medication provided as needed for nausea.  Recommend mental and physical rest for a likely concussion as we discussed.  Please return to the Emergency Department if you experience any worsening of your condition.   Thank you for allowing [place] to be a part of your care.      Sabas Sous, MD 04/24/23 [phone]

## 2023-04-24 NOTE — Discharge Instructions (Signed)
You were evaluated in the Emergency Department and after careful evaluation, we did not find any emergent condition requiring admission or further testing in the hospital.  Your exam/testing today is overall reassuring.  CT did not show any significant injuries.  Recommend Tylenol at home for any discomfort.  Can use the Zofran medication provided as needed for nausea.  Recommend mental and physical rest for a likely concussion as we discussed.  Please return to the Emergency Department if you experience any worsening of your condition.   Thank you for allowing [place] to be a part of your care.

## 2023-04-24 NOTE — Telephone Encounter (Signed)
Called patient, no answer, left message and sent mychart message.      "Ammaar Encina - Will you please call [name] and let her know that her ascending aorta was 41mm by Dr. Garnetta Buddy review which is mildly dilated. Previous images 42mm. There can be up to 3 mm variance in images. Overall stable from prior. We continue keeping her BP well controlled to prevent progression.    Alver Sorrow, NP "

## 2023-05-05 DIAGNOSIS — M6701 Short Achilles tendon (acquired), right ankle: Secondary | ICD-10-CM | POA: Diagnosis not present

## 2023-05-05 DIAGNOSIS — M19072 Primary osteoarthritis, left ankle and foot: Secondary | ICD-10-CM | POA: Diagnosis not present

## 2023-05-05 DIAGNOSIS — M6702 Short Achilles tendon (acquired), left ankle: Secondary | ICD-10-CM | POA: Diagnosis not present

## 2023-05-05 DIAGNOSIS — M76821 Posterior tibial tendinitis, right leg: Secondary | ICD-10-CM | POA: Diagnosis not present

## 2023-05-05 DIAGNOSIS — M76822 Posterior tibial tendinitis, left leg: Secondary | ICD-10-CM | POA: Diagnosis not present

## 2023-05-12 ENCOUNTER — Encounter: Payer: Medicare Other | Attending: Surgery | Admitting: Skilled Nursing Facility1

## 2023-05-12 ENCOUNTER — Encounter: Payer: Self-pay | Admitting: Skilled Nursing Facility1

## 2023-05-12 DIAGNOSIS — Z6841 Body Mass Index (BMI) 40.0 and over, adult: Secondary | ICD-10-CM | POA: Insufficient documentation

## 2023-05-12 NOTE — Progress Notes (Signed)
Bariatric Nutrition Follow-Up Visit Medical Nutrition Therapy    NUTRITION ASSESSMENT   Surgery date: 10/08/2021 Surgery type: sleeve Start weight at [facility]: 327.9 Weight today: 226.8 pounds  Clinical  Medical hx: DVT, HTN, depression, sleep apnea Medications: see list; back on adderall  Labs: A1C 4.7 Notable signs/symptoms: pain, limited mobility, diarrhea not often Any previous deficiencies? No   Lifestyle & Dietary Hx  Pt states she joined [employer] stating she has not gone due tot taking care of everyone else stating she is working on that with her therapist.  Pt states she has been drinking more fluid so is happy with that.  Pt states she feels peanuts give her diarrhea.   Pt states she is ready to work on her relationship with food.    Body Composition Scale 11/11/2022 05/12/2023  Current Body Weight 211.6 226.8  Total Body Fat % 45.0 46.8  Visceral Fat 17 19  Fat-Free Mass % 54.9 53.1   Total Body Water % 41.9 41  Muscle-Mass lbs 27.1 27.1  BMI 38.5 41.3  Body Fat Displacement           Torso  lbs 58.9 65.8         Left Leg  lbs 11.7 13.1         Right Leg  lbs 11.7 13.1         Left Arm  lbs 5.8 6.5         Right Arm   lbs 5.8 6.5     Estimated daily fluid intake: 48-50 oz Estimated daily protein intake: 60 g Supplements: multivitamin   Current average weekly physical activity:   24-Hr Dietary Recall: wakes around 5am First Meal: yogurt, banana, and granola or oatmeal  Snack: peanut butter  Second Meal: [place] rice with black beans and corn + toastios  Snack: small oranges or apple Third Meal: chicken + brown rice + broccoli  Snack: small graham crackers with 2% Lactaid milk  Beverages: water, decaf black coffee, 2% Lactaid milk, water + flavoring   Post-Op Goals/ Signs/ Symptoms Using straws: no Drinking while eating: no Chewing/swallowing difficulties: no Changes in vision: no Changes to mood/headaches: no Hair loss/changes to skin/nails:  no Difficulty focusing/concentrating: no Sweating: no Limb weakness: no Dizziness/lightheadedness: no Palpitations: no  Carbonated/caffeinated beverages: no N/V/D/C/Gas: better Abdominal pain: no Dumping syndrome: no    NUTRITION DIAGNOSIS  Overweight/obesity ([place]-3.3) related to past poor dietary habits and physical inactivity as evidenced by completed bariatric surgery and following dietary guidelines for continued weight loss and healthy nutrition status.     NUTRITION INTERVENTION: continued Nutrition counseling (C-1) and education (E-2) to facilitate bariatric surgery goals, including: The importance of consuming adequate calories as well as certain nutrients daily due to the body's need for essential vitamins, minerals, and fats The importance of intuitive eating specifically learning hunger-satiety cues and understanding the importance of learning a new body: The importance of mindful eating to avoid grazing behaviors: portion sizes -Continue to aim for a minimum of 64 fluid ounces 7 days a week with at least  Encouraged patient to honor their body's internal hunger and fullness cues.  Throughout the day, check in mentally and rate hunger. Stop eating when satisfied not full regardless of how much food is left on the plate.  Get more if still hungry 20-30 minutes later.  The key is to honor satisfaction so throughout the meal, rate fullness factor and stop when comfortably satisfied not physically full. The key is to honor  hunger and fullness without any feelings of guilt or shame.  Pay attention to what the internal cues are, rather than any external factors. This will enhance the confidence you have in listening to your own body and following those internal cues enabling you to increase how often you eat when you are hungry not out of appetite and stop when you are satisfied not full.  Encouraged pt to continue to eat balanced meals inclusive of non starchy vegetables 2 times a day 7  days a week Encouraged pt to choose lean protein sources: limiting beef, pork, sausage, hotdogs, and lunch meat Encourage pt to choose healthy fats such as plant based limiting animal fats Encouraged pt to continue to drink a minium 64 fluid ounces with half being plain water to satisfy proper hydration    Handouts Provided Include  Mindful meals Should I eat sheet  Learning Style & Readiness for Change Teaching method utilized: Visual & Auditory  Demonstrated degree of understanding via: Teach Back  Readiness Level: Action Barriers to learning/adherence to lifestyle change: general physical weakness  RD's Notes for Next Visit Assess adherence to pt chosen goals    MONITORING & EVALUATION Dietary intake, weekly physical activity, body weight.  Next Steps Patient is to follow-up in 3-4 months

## 2023-07-07 DIAGNOSIS — Z1231 Encounter for screening mammogram for malignant neoplasm of breast: Secondary | ICD-10-CM | POA: Diagnosis not present

## 2023-07-29 DIAGNOSIS — M19072 Primary osteoarthritis, left ankle and foot: Secondary | ICD-10-CM | POA: Diagnosis not present

## 2023-08-03 DIAGNOSIS — B349 Viral infection, unspecified: Secondary | ICD-10-CM | POA: Diagnosis not present

## 2023-08-03 DIAGNOSIS — Z03818 Encounter for observation for suspected exposure to other biological agents ruled out: Secondary | ICD-10-CM | POA: Diagnosis not present

## 2023-08-25 DIAGNOSIS — M79671 Pain in right foot: Secondary | ICD-10-CM | POA: Diagnosis not present

## 2023-08-25 DIAGNOSIS — M19072 Primary osteoarthritis, left ankle and foot: Secondary | ICD-10-CM | POA: Diagnosis not present

## 2023-08-25 DIAGNOSIS — M6701 Short Achilles tendon (acquired), right ankle: Secondary | ICD-10-CM | POA: Diagnosis not present

## 2023-08-25 DIAGNOSIS — M1711 Unilateral primary osteoarthritis, right knee: Secondary | ICD-10-CM | POA: Diagnosis not present

## 2023-08-25 DIAGNOSIS — M79672 Pain in left foot: Secondary | ICD-10-CM | POA: Diagnosis not present

## 2023-08-25 DIAGNOSIS — M6702 Short Achilles tendon (acquired), left ankle: Secondary | ICD-10-CM | POA: Diagnosis not present

## 2023-09-01 IMAGING — US IR ABSCESS DRAINAGE
1 series · 5 of 5 positions shown · non-contrast
Comparison: CT [facility], 10/17/2021.

INDICATION: Abdominal wall hematoma.

EXAM:
ULTRASOUND AND FLUOROSCOPIC GUIDED PLACEMENT OF DRAINAGE CATHETER
INTO A RIGHT ABDOMINAL WALL HEMATOMA
TECHNIQUE: Informed written consent was obtained from the the patient and/or
patient's representative after a discussion of the risks, benefits
and alternatives to treatment. Questions regarding the procedure
were encouraged and answered. A timeout was performed prior to the
initiation of the procedure.

[Series 1: ir abscess drainage · 5 acquisitions, 5 frames shown]
[im 1/5]
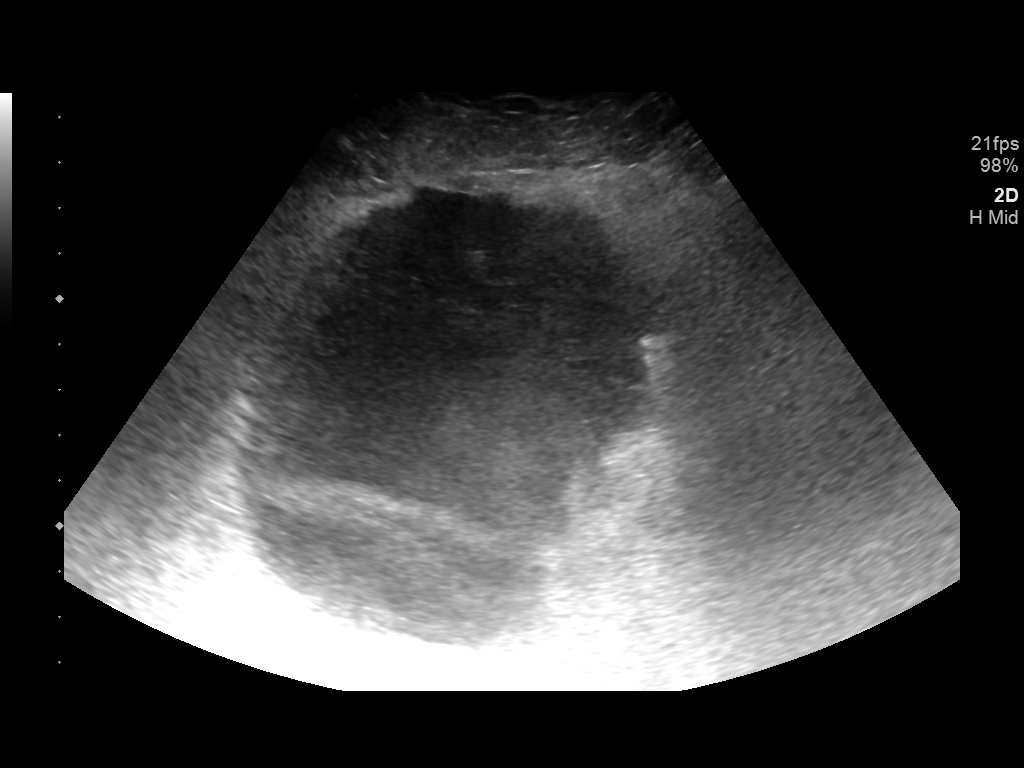
[im 2/5]
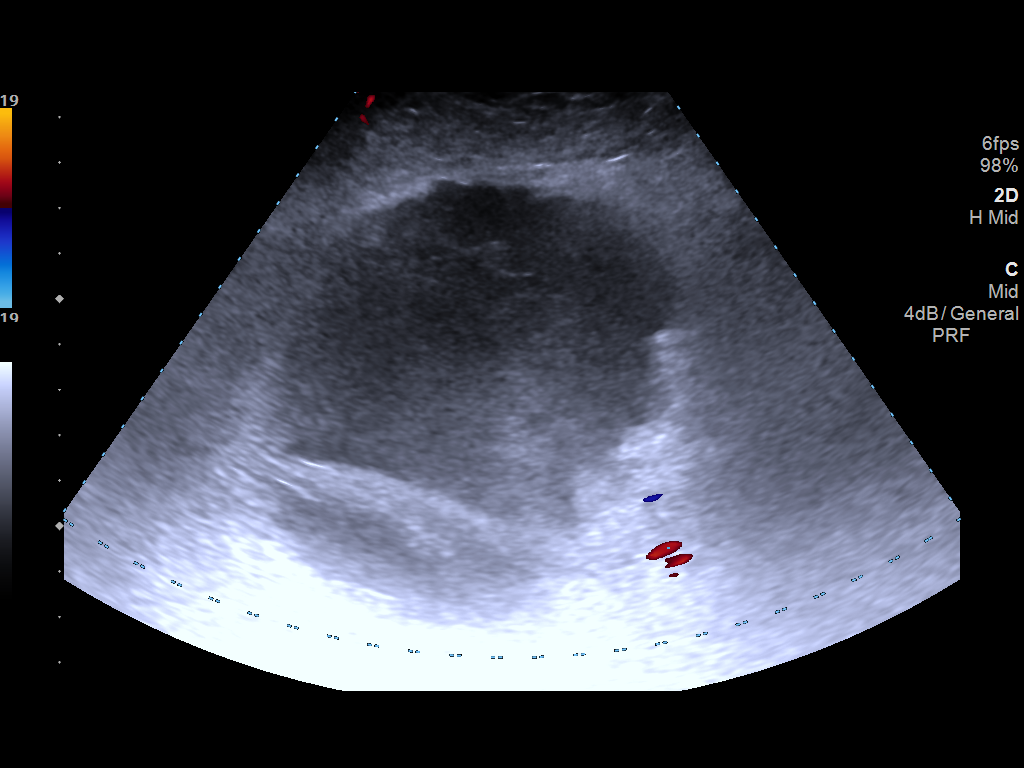
[im 3/5]
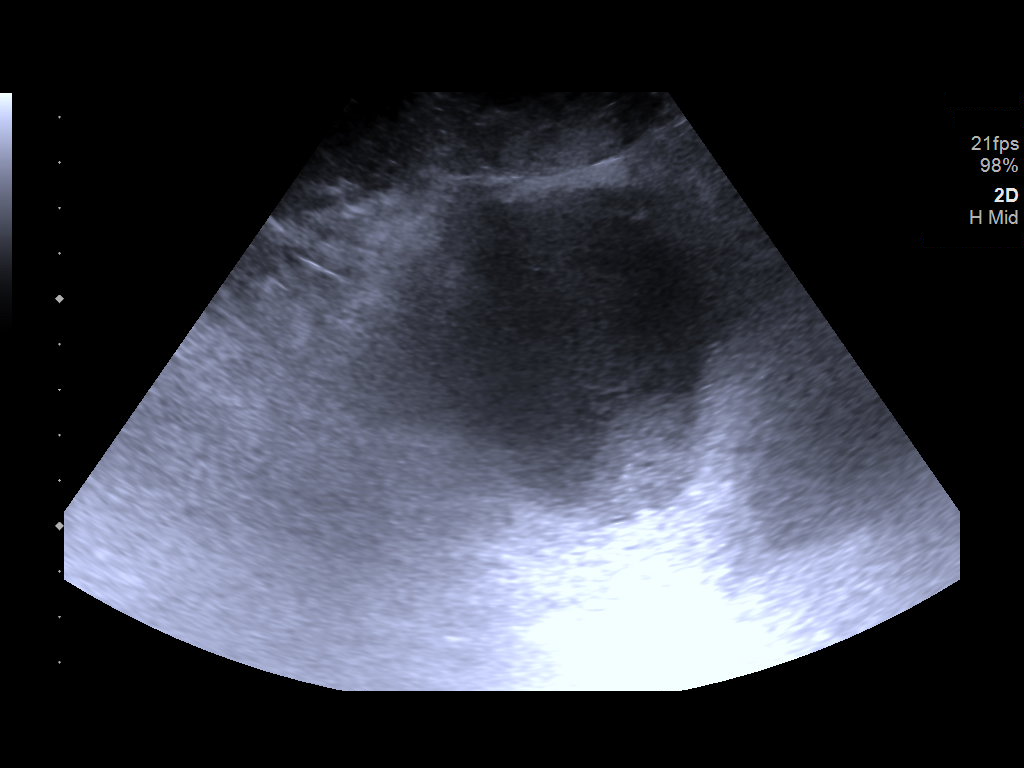
[im 4/5]
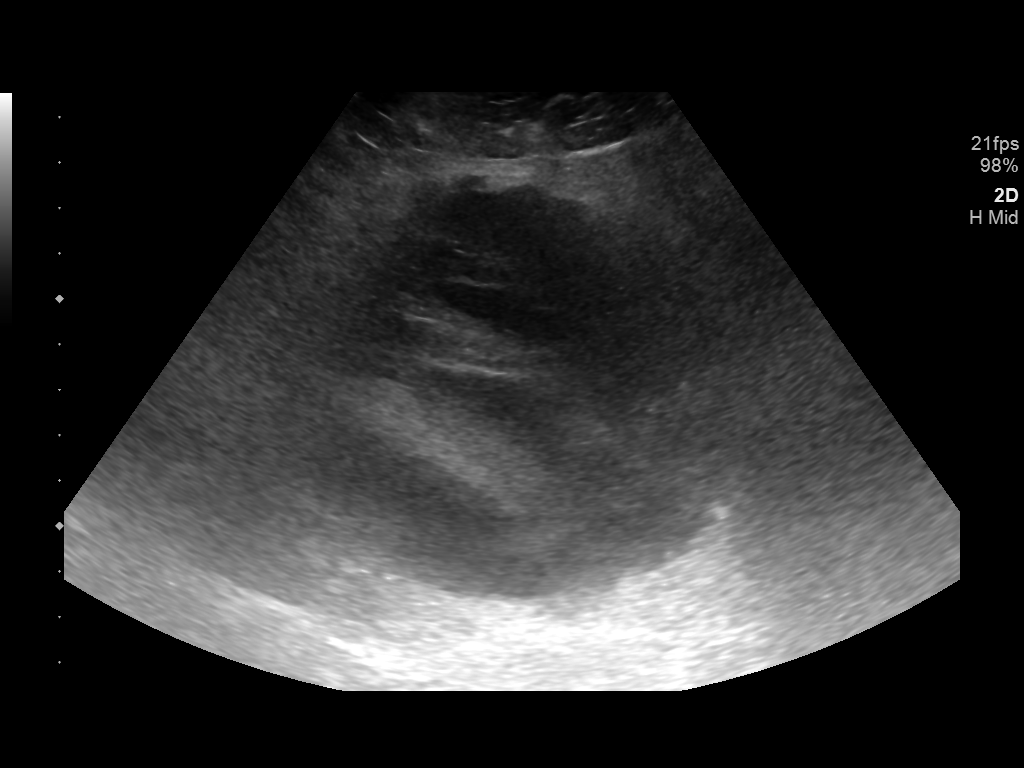
[im 5/5]
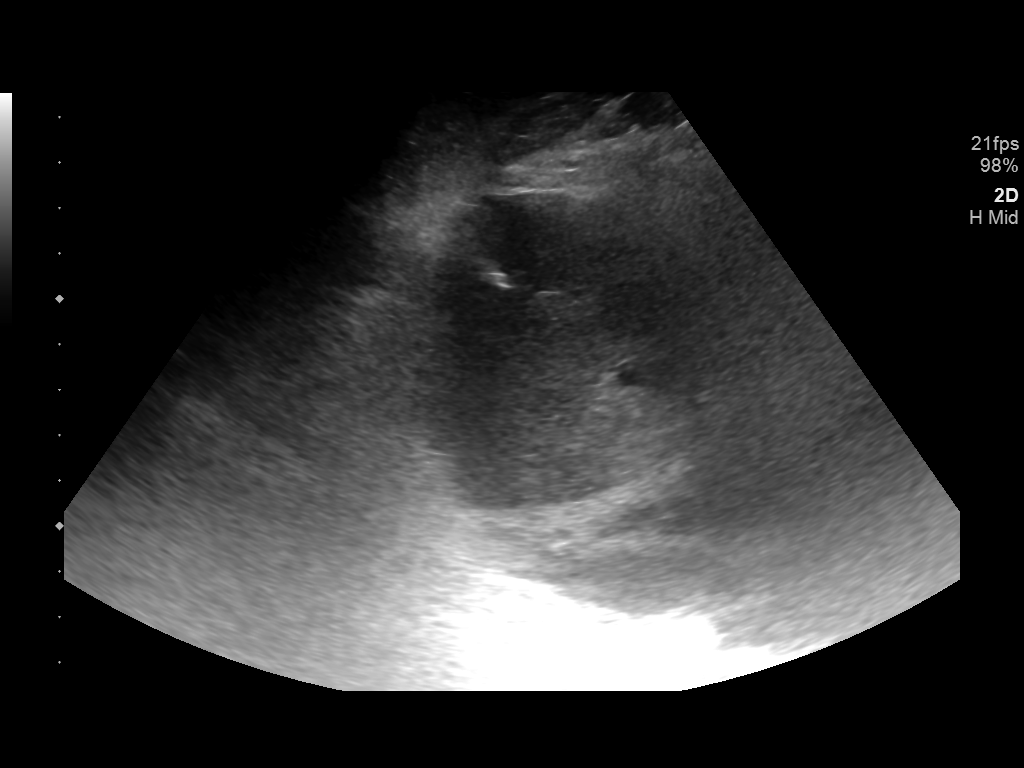

[5 of 5 positions shown; findings below may reference images not displayed]

MEDICATIONS:
None.

CONTRAST:  None.

ANESTHESIA/SEDATION:
Moderate (conscious) sedation was employed during this procedure. A
total of Versed 2 mg and Fentanyl 150 mcg was administered
intravenously.

Moderate Sedation Time: 20 minutes. The patient's level of
consciousness and vital signs were monitored continuously by
radiology nursing throughout the procedure under my direct
supervision.

FLUOROSCOPY TIME:  1 minutes 6 seconds (21 mGy)

COMPLICATIONS:
None immediate.
Ultrasound scanning of the RIGHT abdominal wall demonstrates a large
subcutaneous collection with septation, consistent with known
hematoma. The RIGHT abdomen was prepped and draped usual sterile
fashion a sterile drape was applied, covering the operative table.
Maximum barrier sterile technique with sterile gowns and gloves were
used for the procedure. A timeout was performed prior to the
initiation of the procedure. Local anesthesia was provided with 1%
lidocaine with epinephrine.

Under direct ultrasound guidance, an 18 gauge trocar needle was
advanced into the collection with the RIGHT abdominal wall.
Appropriate positioning was confirmed with the efflux of old blood.
An Amplatz super stiff wire was advanced across the collection to
the inferior most dependent portion under intermittent fluoroscopic
guidance.

A 12 Fr "biliary" drainage catheter was selected and additional
sideholes were cut proximally, then inserted over wire to the
collection. A sample was collected and submitted for microbiological
analysis. Several postprocedural spot abdominal radiographs were
obtained. The catheter was secured with a 0-Silk suture and
connected to gravity drainage bag. Dressings were placed. The
patient tolerated procedure well without immediate postprocedural
complication.
FINDINGS: After successful ultrasound and fluoroscopic guided placement of
drainage catheter into RIGHT abdominal wall hematoma.

Following drainage catheter placement, approximately 20 mL of
hematoma was aspirated and submitted for analysis.
IMPRESSION: Successful placement of a 12 Fr drainage catheter into a RIGHT
abdominal wall hematoma, as above.

## 2023-09-04 IMAGING — CT CT ABD-PELV W/ CM
3 of 5 series · 16 of 46 positions shown, 18 images · IV contrast (OMNIPAQUE)
Comparison: 10/17/2021

CLINICAL DATA: Abdominal pain

EXAM:
CT ABDOMEN AND PELVIS WITH CONTRAST
TECHNIQUE: Multidetector CT imaging of the abdomen and pelvis was performed
using the standard protocol following bolus administration of
intravenous contrast.
CONTRAST:  100mL OMNIPAQUE IOHEXOL 350 MG/ML SOLN

[Series 2: axial st · axial · 0.98mm/px · z∈[-491,-71]mm · 12 of 100 slices shown, 14 images]
[im 8/100  soft-tissue]
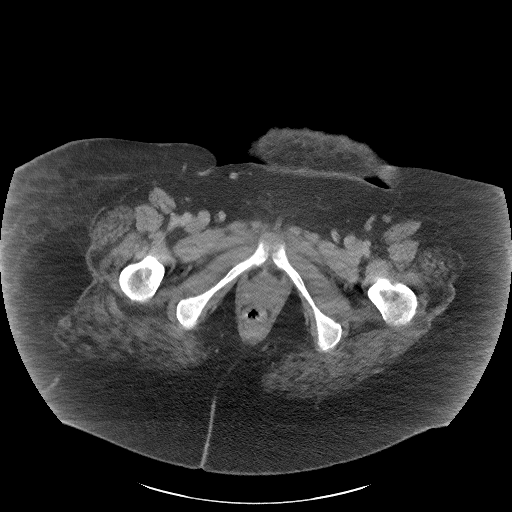
[im 8/100  bone]
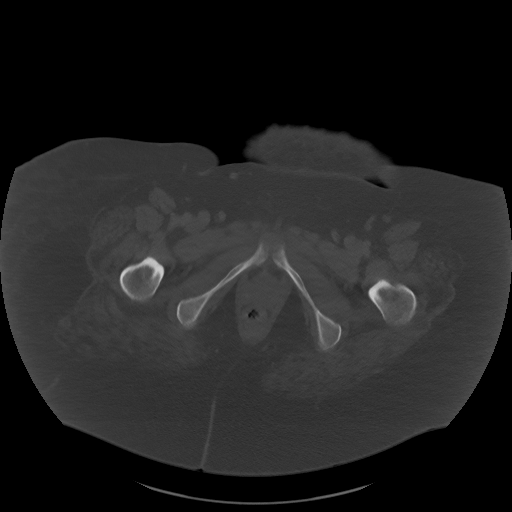
[im 16/100  soft-tissue]
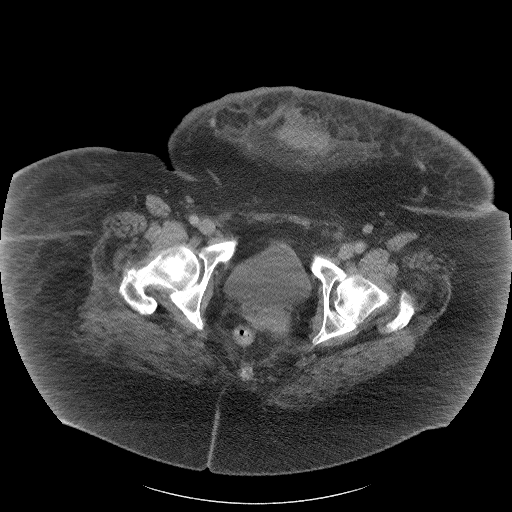
[im 23/100  soft-tissue]
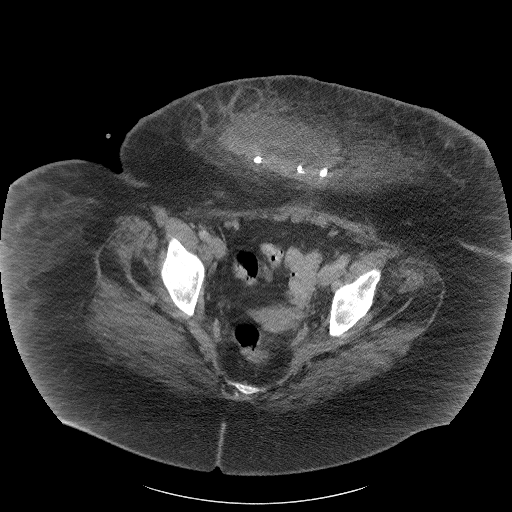
[im 31/100  soft-tissue]
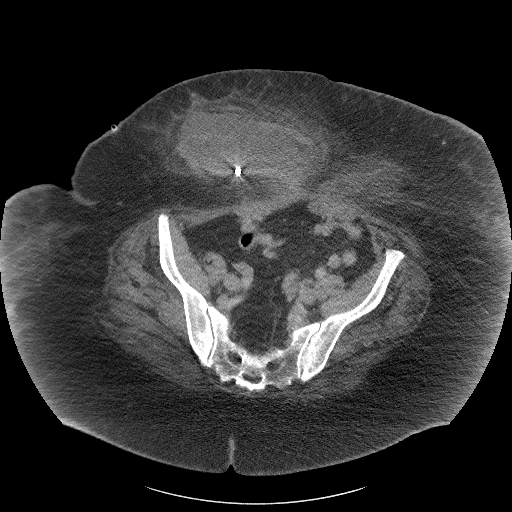
[im 39/100  soft-tissue]
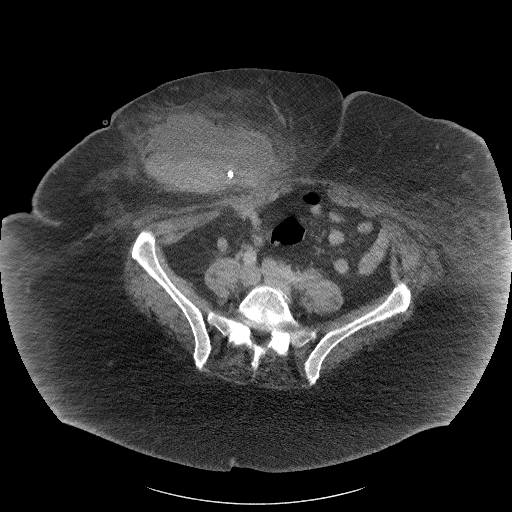
[im 46/100  soft-tissue]
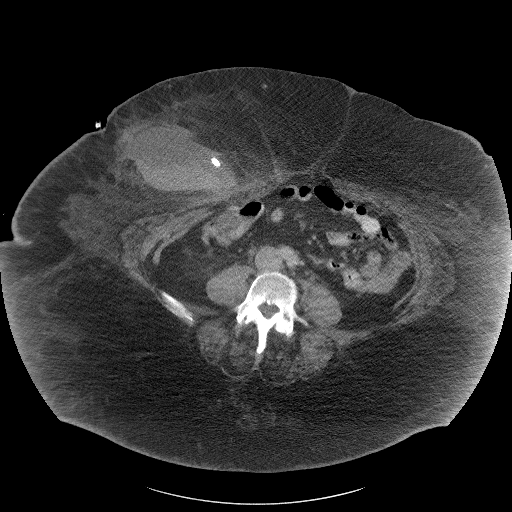
[im 54/100  soft-tissue]
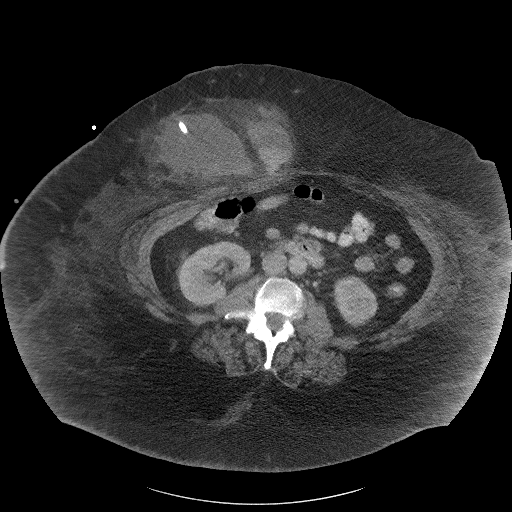
[im 61/100  soft-tissue]
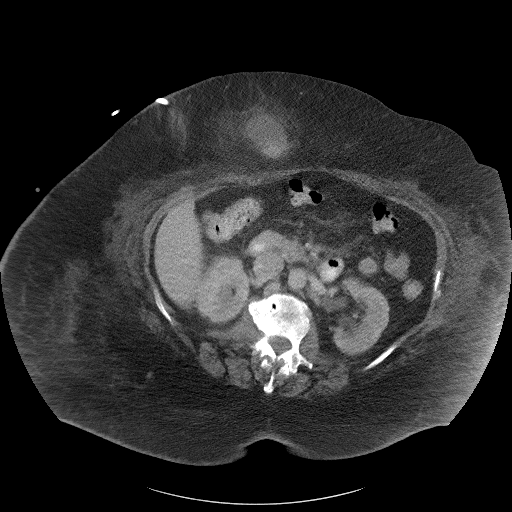
[im 69/100  soft-tissue]
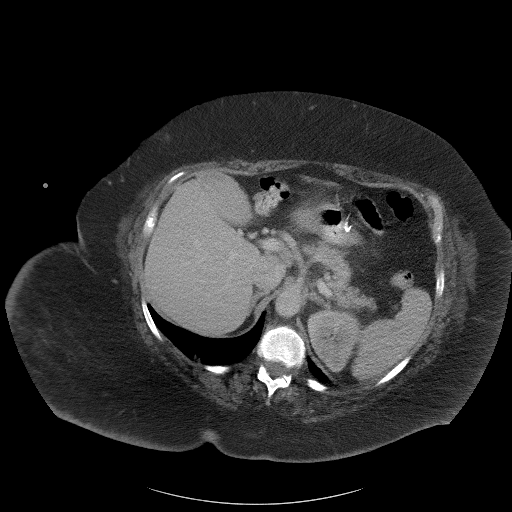
[im 69/100  bone]
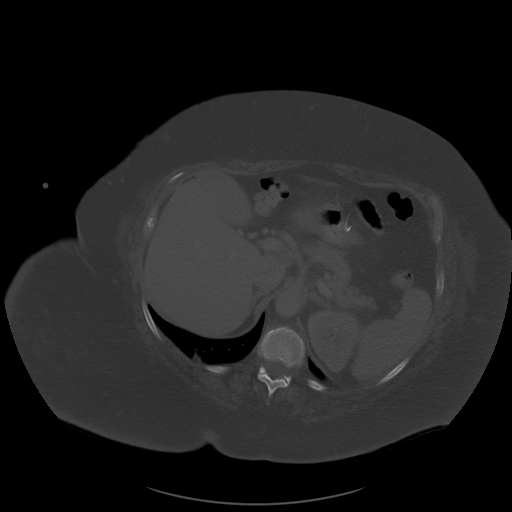
[im 77/100  soft-tissue]
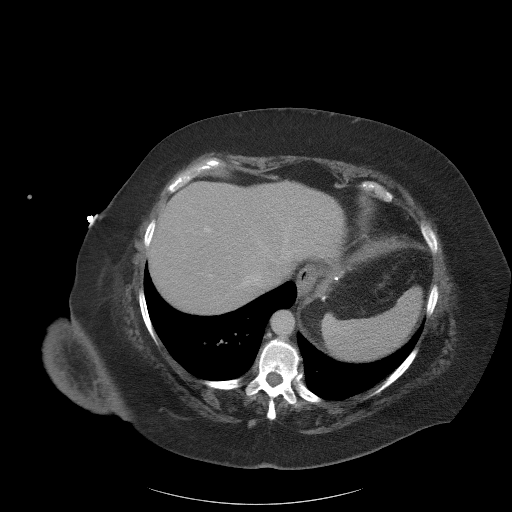
[im 84/100  soft-tissue]
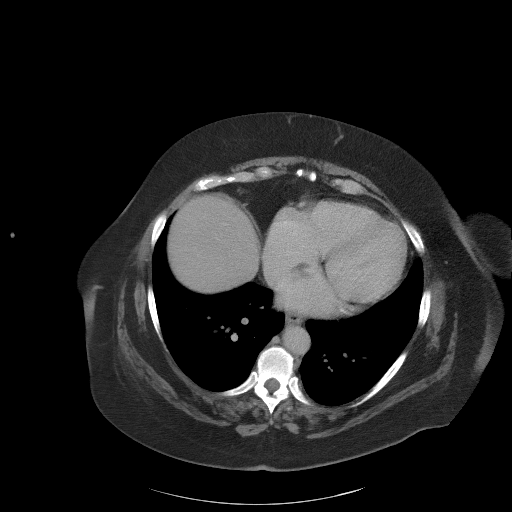
[im 92/100  soft-tissue]
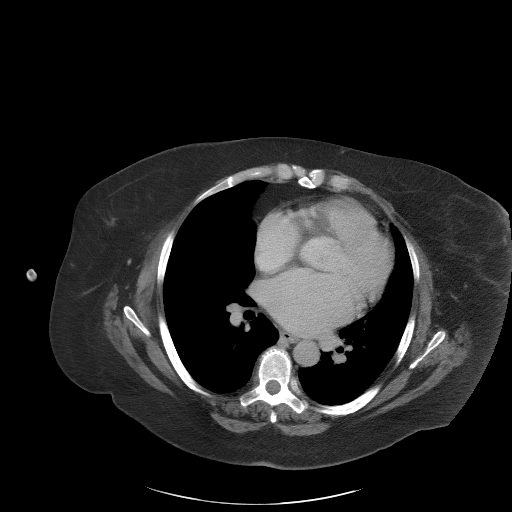

[Series 4: coronal st · coronal · 0.93mm/px · 3 of 135 slices shown]
[im 45/135  soft-tissue]
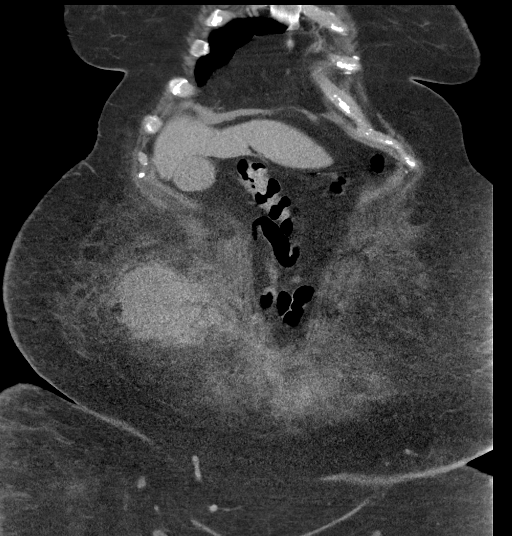
[im 60/135  soft-tissue]
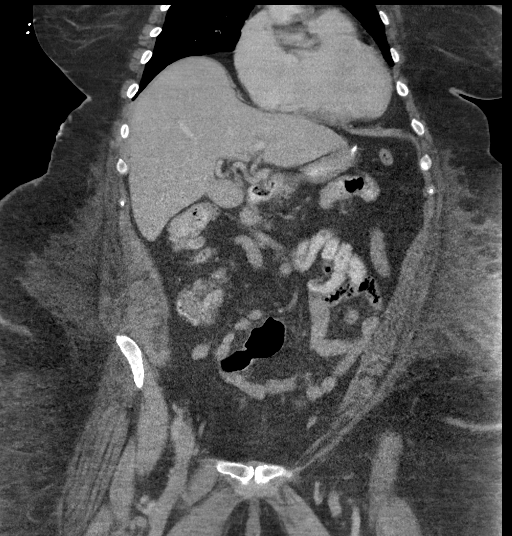
[im 75/135  soft-tissue]
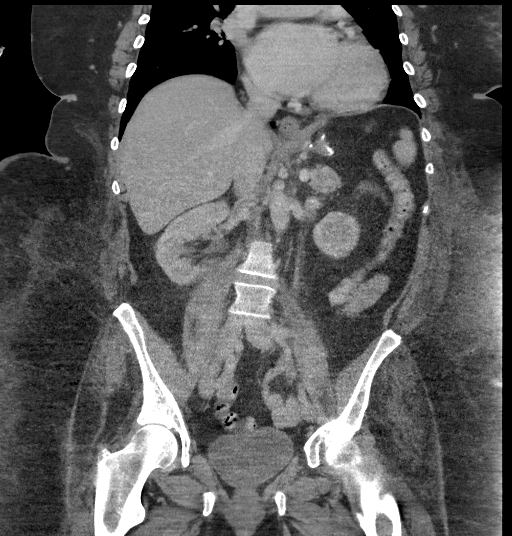

[Series 6: lung bases · axial · 0.98mm/px · 1 of 96 slices shown]
[im 8/96  bone]
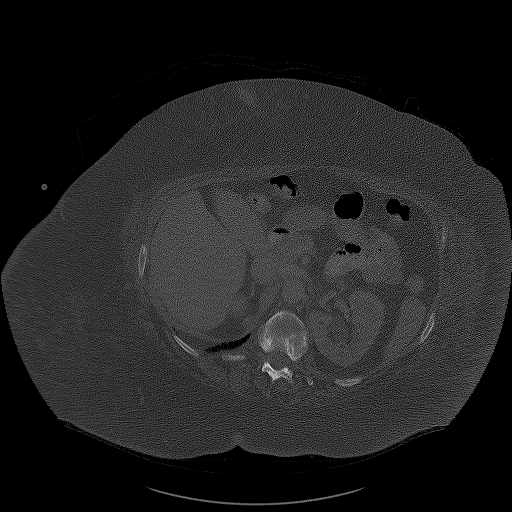

[16 of 46 positions shown; findings below may reference images not displayed]

FINDINGS: Lower chest: No acute abnormality

Hepatobiliary: No focal hepatic abnormality. Gallbladder
unremarkable.

Pancreas: No focal abnormality or ductal dilatation.

Spleen: No focal abnormality.  Normal size.

Adrenals/Urinary Tract: Left renal parapelvic cysts. No
hydronephrosis. Adrenal glands unremarkable. Urinary bladder
decompressed, grossly unremarkable.

Stomach/Bowel: Normal appendix. Changes of gastric sleeve. Stomach,
large and small bowel grossly unremarkable.

Vascular/Lymphatic: No evidence of aneurysm or adenopathy.

Reproductive: Uterus and adnexa unremarkable.  No mass.

Other: No free fluid or free air. Large anterior abdominal wall
fluid collection again noted. Pigtail drainage catheter has been
placed into the largest fluid collection. This measures 10.4 x
current leak, compared with 10.1 x 8.3 cm previously. Overall this
is not significantly changed. Layering high density material with in
the fluid collections suggest layering hematocrit level related to
hematoma. Smaller adjacent fluid collections superiorly also
unchanged.

Musculoskeletal: No acute bony abnormality.
IMPRESSION: Anterior abdominal wall fluid collections with layering hematocrit
levels most compatible with postoperative hematomas. Pigtail
drainage catheter has been placed in the largest fluid collection
but is not significantly changed since prior study.

No acute intra-abdominal process.

Changes of gastric sleeve.

## 2023-09-21 ENCOUNTER — Encounter (HOSPITAL_BASED_OUTPATIENT_CLINIC_OR_DEPARTMENT_OTHER): Payer: Self-pay

## 2023-09-21 DIAGNOSIS — I4819 Other persistent atrial fibrillation: Secondary | ICD-10-CM | POA: Diagnosis not present

## 2023-09-21 DIAGNOSIS — Z9181 History of falling: Secondary | ICD-10-CM | POA: Diagnosis not present

## 2023-09-21 DIAGNOSIS — D6869 Other thrombophilia: Secondary | ICD-10-CM | POA: Diagnosis not present

## 2023-09-21 DIAGNOSIS — Z9884 Bariatric surgery status: Secondary | ICD-10-CM | POA: Diagnosis not present

## 2023-09-21 DIAGNOSIS — Z79899 Other long term (current) drug therapy: Secondary | ICD-10-CM | POA: Diagnosis not present

## 2023-09-21 DIAGNOSIS — I10 Essential (primary) hypertension: Secondary | ICD-10-CM | POA: Diagnosis not present

## 2023-09-21 DIAGNOSIS — I700 Atherosclerosis of aorta: Secondary | ICD-10-CM | POA: Diagnosis not present

## 2023-09-21 DIAGNOSIS — M8588 Other specified disorders of bone density and structure, other site: Secondary | ICD-10-CM | POA: Diagnosis not present

## 2023-09-21 DIAGNOSIS — R7303 Prediabetes: Secondary | ICD-10-CM | POA: Diagnosis not present

## 2023-09-21 DIAGNOSIS — Z9989 Dependence on other enabling machines and devices: Secondary | ICD-10-CM | POA: Diagnosis not present

## 2023-09-21 DIAGNOSIS — Z0000 Encounter for general adult medical examination without abnormal findings: Secondary | ICD-10-CM | POA: Diagnosis not present

## 2023-09-21 DIAGNOSIS — G4733 Obstructive sleep apnea (adult) (pediatric): Secondary | ICD-10-CM | POA: Diagnosis not present

## 2023-09-21 DIAGNOSIS — Z23 Encounter for immunization: Secondary | ICD-10-CM | POA: Diagnosis not present

## 2023-09-21 DIAGNOSIS — D509 Iron deficiency anemia, unspecified: Secondary | ICD-10-CM | POA: Diagnosis not present

## 2023-09-21 LAB — LAB REPORT - SCANNED
A1c: 5.6
EGFR: 77

## 2023-09-21 NOTE — Telephone Encounter (Signed)
Please advise. Thank you

## 2023-09-21 NOTE — Telephone Encounter (Signed)
Chart reviewed. Samples last received 08/2022. Last seen 09/2022 recommended for 1 year follow up. Last labs in [place], [place], [facility] are from 08/2022. Will fax request to PCP for most recent labs to ensure Xarelto dosing.   Alver Sorrow, NP

## 2023-09-22 ENCOUNTER — Telehealth (HOSPITAL_BASED_OUTPATIENT_CLINIC_OR_DEPARTMENT_OTHER): Payer: Self-pay

## 2023-09-22 MED ORDER — RIVAROXABAN 20 MG PO TABS: 20.0000 mg | ORAL_TABLET | Freq: Every day | ORAL | 0 refills | Status: AC

## 2023-09-22 NOTE — Telephone Encounter (Signed)
Labs from PCP 09/21/23 Hb 14.5, Hct 43.8, creatinine 0.84.  Creatinine clearance >50. Xarelto 20mg  daily dose is appropriate.   Will route to triage team can provide 2 weeks of Xarelto 20mg  daily samples. Unable to provide further samples after that per office policy. If further assistance needed prior to end of calendar year, recommend utilizing [facility].  Alver Sorrow, NP

## 2023-09-22 NOTE — Telephone Encounter (Signed)
Approved for 14 day sample of Xarelto. MyChart message sent to patient. Attempted to call but no answer at this time.

## 2023-09-30 ENCOUNTER — Telehealth (HOSPITAL_BASED_OUTPATIENT_CLINIC_OR_DEPARTMENT_OTHER): Payer: Self-pay

## 2023-09-30 NOTE — Telephone Encounter (Addendum)
Attempted to call patient (second time), no answer at this time.

## 2023-09-30 NOTE — Telephone Encounter (Signed)
Left voice message for patient to call back

## 2023-10-01 ENCOUNTER — Other Ambulatory Visit (HOSPITAL_BASED_OUTPATIENT_CLINIC_OR_DEPARTMENT_OTHER): Payer: Self-pay | Admitting: Family

## 2023-10-01 DIAGNOSIS — I4821 Permanent atrial fibrillation: Secondary | ICD-10-CM

## 2023-10-01 NOTE — Telephone Encounter (Signed)
Prescription refill request for Xarelto received.  Indication: AF Last office visit: 09/26/22  Brunetta Genera NP Weight: 98.4kg Age: 67 Scr: 0.70 on 12/13/21 CrCl: 124.46  Based on above findings Xarelto 20mg  daily is the appropriate dose.  Pt is past due for MD appt and lab work.  Message sent to schedulers to make appt.  Refill approved x 1 only.

## 2023-10-01 NOTE — Telephone Encounter (Signed)
Called left detailed VM per DPR regarding samples.   This is our third attempt to contact her. Per office protocols, will remove from triage basket.   Alver Sorrow, NP

## 2023-10-01 NOTE — Telephone Encounter (Signed)
Called, left detailed VM per DPR.  As this is our third attempt to contact will remove from triage.   Alver Sorrow, NP

## 2023-10-02 DIAGNOSIS — M1711 Unilateral primary osteoarthritis, right knee: Secondary | ICD-10-CM | POA: Diagnosis not present

## 2023-10-02 DIAGNOSIS — M1712 Unilateral primary osteoarthritis, left knee: Secondary | ICD-10-CM | POA: Diagnosis not present

## 2023-10-19 ENCOUNTER — Ambulatory Visit: Payer: Medicare Other | Admitting: Skilled Nursing Facility1

## 2023-11-03 ENCOUNTER — Ambulatory Visit: Payer: Medicare Other | Admitting: Skilled Nursing Facility1

## 2023-12-21 ENCOUNTER — Encounter (HOSPITAL_BASED_OUTPATIENT_CLINIC_OR_DEPARTMENT_OTHER): Payer: Self-pay | Admitting: Family

## 2023-12-21 ENCOUNTER — Ambulatory Visit (HOSPITAL_BASED_OUTPATIENT_CLINIC_OR_DEPARTMENT_OTHER): Payer: Medicare Other | Admitting: Family

## 2023-12-21 VITALS — BP 118/72 | HR 60 | Ht 62.0 in | Wt 233.4 lb

## 2023-12-21 DIAGNOSIS — Z6841 Body Mass Index (BMI) 40.0 and over, adult: Secondary | ICD-10-CM | POA: Diagnosis not present

## 2023-12-21 DIAGNOSIS — D6859 Other primary thrombophilia: Secondary | ICD-10-CM

## 2023-12-21 DIAGNOSIS — I4819 Other persistent atrial fibrillation: Secondary | ICD-10-CM

## 2023-12-21 DIAGNOSIS — I10 Essential (primary) hypertension: Secondary | ICD-10-CM

## 2023-12-21 NOTE — Patient Instructions (Addendum)
 Medication Instructions:  Your physician recommends that you continue on your current medications as directed. Please refer to the Current Medication list given to you today.  Follow-Up: At [facility], you and your health needs are our priority.  As part of our continuing mission to provide you with exceptional heart care, we have created designated Provider Care Teams.  These Care Teams include your primary Cardiologist (physician) and Advanced Practice Providers (APPs -  Physician Assistants and Nurse Practitioners) who all work together to provide you with the care you need, when you need it.  We recommend signing up for the patient portal called "MyChart".  Sign up information is provided on this After Visit Summary.  MyChart is used to connect with patients for Virtual Visits (Telemedicine).  Patients are able to view lab/test results, encounter notes, upcoming appointments, etc.  Non-urgent messages can be sent to your provider as well.   To learn more about what you can do with MyChart, go to [email or web address].    Your next appointment:   1 year with Dr. Lenor Derrick   Other Instructions

## 2023-12-21 NOTE — Progress Notes (Signed)
 Cardiology Office Note:  .   Date:  12/21/2023  ID:  [name] , DOB [birth year], MRN [id number] PCP: Laurann Montana, MD  [facility] Providers Cardiologist:  [name], MD Electrophysiologist:  Hillis Range, MD (Inactive)    History of Present Illness: .   [name] is a 68 y.o. female with a hx of hypertension, DVT, permanent atrial fibrillation on chronic anticoagulation, PVC, morbid obesity, osteomyelitis, OSA, aortic atherosclerosis.   Previous history of osteomyelitis of her right foot requiring multiple surgeries in 2019 and immobility    She has followed routinely with Dr. Johney Frame in regards to her atrial fibrillation.  Previous work-up for atypical chest pain includes stress test April 2021 with no evidence of ischemia.  Echocardiogram April 2021 LVEF 55-60%, no R WMA, moderate LVH, normal PASP, LA severely dilated, mild to moderate MR.  The patient did request further work-up with cardiac CTA but was discussed with cardiology reader team who did not think with her atrial fibrillation and BMI that diagnostic images would be able to be obtained.    ED visit 02/09/2021 for chest pain with CT angio chest aorta showing mild aneurysmal dilation to 4.2 cm in the ascending aorta recommended for annual imaging, mild dependent atelectatic changes in the lungs bilaterally, aortic atherosclerosis.  Report does not metion coronary atherosclerosis.     Admitted 10/08/2021 for planned laparoscopic gastric sleeve resection as well as hiatal hernia repair.  Discharged 48 hours later.  HCTZ was discontinued.  Postoperative course unremarkable.  However subsequently readmitted 10/18/11/28/22 for postoperative abdominal wall hematoma.  She was transfused 2 units PRBC.  Due to tachycardia cardiology team increased her Lopressor from 12.5 to 25 mg twice daily.  Xarelto was able to be resumed prior to discharge.   Saw atrial fibrillation clinic 11/13/2021.  She denied chest pain  or dyspnea or palpitations.  Felt she was recovering somewhat slowly from her surgery.  Repeat CT chest 02/12/2022 revealed stable 4.2 cm ascending thoracic aortic aneurysm as well as known aortic atherosclerosis.   She was seen 02/17/2022.  3-day ZIO revealed atrial fibrillation 100% burden which was rate controlled with average heart rate 75 bpm.  She did have PVC with 10.7% burden.  02/2022 she was started on triamterene-HCTZ for edema.  Due to elevated BNP 263 subsequent echocardiogram 03/27/2022 normal LVEF 60 to 65%, no RWMA, moderate LVH, indeterminate diastolic parameters due to atrial fibrillation, RV normal size and function, normal PASP, LA severely dilated, RA mildly dilated, trivial MR, small ascending aortic aneurysm 4.0 cm which was stable compared to previous.   Seen 03/28/2022 and 09/2022 doing well from a cardiac perspective.   Presents today for follow up independently. She notes stressors related to medication prices. She has $340 dollar deductible which she is paying installments and her Xarelto is $47 per month.  Previously tested positive for OSA recommended for BIPAP which she was unable to get due to shortage. Has since had weight loss surgery and reports waking up feeling well rested and no daytime somnolence.  ROS: Please see the history of present illness.    All other systems reviewed and are negative.   Studies Reviewed: Marland Kitchen   EKG Interpretation Date/Time:  Monday December 21 2023 11:26:36 EST Ventricular Rate:  60 PR Interval:    QRS Duration:  96 QT Interval:  386 QTC Calculation: 386 R Axis:   73  Text Interpretation: Atrial fibrillation  No acute ST/T wave changes Confirmed by Gillian Shields ([id number]) on  12/21/2023 11:31:51 AM    Cardiac Studies & Procedures   ______________________________________________________________________________________________   STRESS TESTS  MYOCARDIAL PERFUSION IMAGING 02/23/2020  Narrative  The left ventricular ejection fraction  is normal (55-65%).  Nuclear stress EF: 49%.  There was no ST segment deviation noted during stress.  The study is normal.  This is a low risk study.  Normal pharmacologic nuclear stress test with no evidence for prior infarct or ischemia. LVEF 55%. Frequent PVCs during stress and in recovery.   ECHOCARDIOGRAM  ECHOCARDIOGRAM COMPLETE 03/27/2022  Narrative ECHOCARDIOGRAM REPORT    Patient Name:   [name] Date of Exam: 03/27/2022 Medical Rec #:  [id number]         Height:       62.0 in Accession #:    [id number]        Weight:       260.8 lb Date of Birth:  [birth year]         BSA:          2.140 m Patient Age:    66 years          BP:           125/85 mmHg Patient Gender: F                 HR:           98 bpm. Exam Location:  Outpatient  Procedure: 2D Echo, Color Doppler and Cardiac Doppler  Indications:    R06.02 SOB; R07.89 Other chest pain; R60.0 Lower extremity edema; I48.2 Chronic atrial fibrillation  History:        Patient has prior history of Echocardiogram examinations, most recent 02/23/2020. Arrythmias:Atrial Fibrillation and PVC, Signs/Symptoms:Shortness of Breath, Chest Pain and Edema; Risk Factors:Hypertension and Sleep Apnea. Patient has had intermittent chest pain, SOB and leg edema.  Sonographer:    Carlos American RVT, RDCS (AE), RDMS Referring Phys: [id number] Alver Sorrow   Sonographer Comments: Patient is morbidly obese. Global longitudinal strain was attempted. IMPRESSIONS   1. Left ventricular ejection fraction, by estimation, is 60 to 65%. The left ventricle has normal function. The left ventricle has no regional wall motion abnormalities. There is moderate left ventricular hypertrophy. Left ventricular diastolic parameters are indeterminate. 2. Right ventricular systolic function is normal. The right ventricular size is normal. There is normal pulmonary artery systolic pressure. 3. Left atrial size was severely dilated. 4. Right atrial  size was mildly dilated. 5. The mitral valve is degenerative. Trivial mitral valve regurgitation. 6. The aortic valve is normal in structure. Aortic valve regurgitation is not visualized. 7. Aortic small ascending aortic aneurysm 4.0 cm. 8. The inferior vena cava is normal in size with greater than 50% respiratory variability, suggesting right atrial pressure of 3 mmHg.  Comparison(s): No significant change from prior study. EF 55%, asc aor 42mm, mod LVH.  FINDINGS Left Ventricle: Left ventricular ejection fraction, by estimation, is 60 to 65%. The left ventricle has normal function. The left ventricle has no regional wall motion abnormalities. The left ventricular internal cavity size was normal in size. There is moderate left ventricular hypertrophy. Left ventricular diastolic parameters are indeterminate.  Right Ventricle: The right ventricular size is normal. Right ventricular systolic function is normal. There is normal pulmonary artery systolic pressure. The tricuspid regurgitant velocity is 2.55 m/s, and with an assumed right atrial pressure of 3 mmHg, the estimated right ventricular systolic pressure is 29.0 mmHg.  Left Atrium: Left atrial size was severely dilated.  Right Atrium: Right atrial size was mildly dilated.  Pericardium: There is no evidence of pericardial effusion.  Mitral Valve: The mitral valve is degenerative in appearance. Mild mitral annular calcification. Trivial mitral valve regurgitation.  Tricuspid Valve: The tricuspid valve is normal in structure. Tricuspid valve regurgitation is trivial.  Aortic Valve: The aortic valve is normal in structure. Aortic valve regurgitation is not visualized. Aortic valve mean gradient measures 4.0 mmHg. Aortic valve peak gradient measures 6.5 mmHg. Aortic valve area, by VTI measures 2.24 cm.  Pulmonic Valve: The pulmonic valve was normal in structure. Pulmonic valve regurgitation is not visualized.  Aorta: Small ascending  aortic aneurysm 4.0 cm.  Venous: The inferior vena cava is normal in size with greater than 50% respiratory variability, suggesting right atrial pressure of 3 mmHg.  IAS/Shunts: No atrial level shunt detected by color flow Doppler.   LEFT VENTRICLE PLAX 2D LVIDd:         5.56 cm   Diastology LVIDs:         3.75 cm   LV e' medial:    7.07 cm/s LV PW:         1.28 cm   LV E/e' medial:  12.3 LV IVS:        1.06 cm   LV e' lateral:   12.90 cm/s LVOT diam:     2.30 cm   LV E/e' lateral: 6.7 LV SV:         59 LV SV Index:   28 LVOT Area:     4.15 cm  3D Volume EF: 3D EF:        55 % LV EDV:       121 ml LV ESV:       54 ml LV SV:        67 ml  RIGHT VENTRICLE RV S prime:     12.40 cm/s TAPSE (M-mode): 2.1 cm  LEFT ATRIUM              Index        RIGHT ATRIUM           Index LA diam:        6.10 cm  2.85 cm/m   RA Area:     22.90 cm LA Vol (A2C):   200.0 ml 93.46 ml/m  RA Volume:   77.30 ml  36.12 ml/m LA Vol (A4C):   149.0 ml 69.63 ml/m LA Biplane Vol: 176.0 ml 82.24 ml/m AORTIC VALVE                    PULMONIC VALVE AV Area (Vmax):    2.30 cm     PV Vmax:          0.93 m/s AV Area (Vmean):   2.22 cm     PV Peak grad:     3.4 mmHg AV Area (VTI):     2.24 cm     PR End Diast Vel: 2.88 msec AV Vmax:           127.00 cm/s AV Vmean:          90.400 cm/s AV VTI:            0.263 m AV Peak Grad:      6.5 mmHg AV Mean Grad:      4.0 mmHg LVOT Vmax:         70.30 cm/s LVOT Vmean:        48.200 cm/s LVOT VTI:  0.142 m LVOT/AV VTI ratio: 0.54  AORTA Ao Root diam: 3.60 cm Ao Asc diam:  4.00 cm Ao Arch diam: 3.2 cm  MITRAL VALVE               TRICUSPID VALVE MV Area (PHT): 3.58 cm    TR Peak grad:   26.0 mmHg MV Decel Time: 212 msec    TR Vmax:        255.00 cm/s MV E velocity: 87.00 cm/s SHUNTS Systemic VTI:  0.14 m Systemic Diam: 2.30 cm  Carolan Clines Electronically signed by Carolan Clines Signature Date/Time: 03/27/2022/11:56:38 AM    Final     MONITORS  LONG TERM MONITOR (3-14 DAYS) 02/27/2022  Narrative  Patient had a minimum heart rate of 51 bpm, maximum heart rate of 126 bpm, and average heart rate of 75 bpm.  Predominant underlying rhythm was atrial fibrillation (100% of study)  Frequent PVCs, some of which are Ashman beats.  No triggered and diary events.  Persistent, rare controlled, atrial fibrillation       ______________________________________________________________________________________________      Risk Assessment/Calculations:    CHA2DS2-VASc Score = 4   This indicates a 4.8% annual risk of stroke. The patient's score is based upon: CHF History: 0 HTN History: 1 Diabetes History: 0 Stroke History: 0 Vascular Disease History: 1 Age Score: 1 Gender Score: 1            Physical Exam:   VS:  BP 118/72   Pulse 60   Ht 5\' 2"  (1.575 m)   Wt 233 lb 6.4 oz (105.9 kg)   BMI 42.69 kg/m    Wt Readings from Last 3 Encounters:  12/21/23 233 lb 6.4 oz (105.9 kg)  05/12/23 226 lb 12.8 oz (102.9 kg)  11/11/22 211 lb 9.6 oz (96 kg)    GEN: Well nourished, overweight,well developed in no acute distress NECK: No JVD; No carotid bruits CARDIAC: Irregularly irregular, no murmurs, rubs, gallops RESPIRATORY:  Clear to auscultation without rales, wheezing or rhonchi  ABDOMEN: Soft, non-tender, non-distended EXTREMITIES:  No edema; No deformity   ASSESSMENT AND PLAN: .    Aortic atherosclerosis - Noted by prior CT. Previously declined statin. Stable with no anginal symptoms. No indication for ischemic evaluation.   No aspirin due to chronic anticoagulation.    HTN - BP well controlled today. Discussed to monitor BP at home at least 2 hours after medications and sitting for 5-10 minutes.  BP goal less than 130/80.  Continue present antihypertensive regimen changes HCTZ 37.5-25 mg daily, metoprolol tartrate 25 mg twice daily   LE edema -Echo 02/2022 normal LVEF, indeterminate diastolic dysfunction due to  atrial fibrillation. Well controlled on triamterene-HCTZ.  No evidence of heart failure.  Suspect lower extremity edema related to venous insufficiency.  Recommend elevating lower extremity as, low-sodium diet, restrict to less than 2 L of fluid intake per day.   Permanent atrial fibrillation / Chronic anticoagulation / PVC - Continue Lopressor 25 mg twice daily.  Continue Xarelto 20 mg daily for anticoagulation.  Denies bleeding complications.  Will initiate patient assistance paperwork today.   HLD - Previously declined statin. Heart healthy diet and regular cardiovascular exercise encouraged.  Offered referral to prep exercise program which she will consider.   Ascending thoracic aortic aneurysm - CT chest 01/2021 and 01/2022 with stable 4.2 cm ascending thoracic aneurysm.  CT 03/2023 showed no aortic aneurysm.  Will route to Dr. Izora Ribas for review to ensure no repeat imaging needed in  future.    OSA  - Previously recommended for BIPAP but never started due to shortage. No daytime somnolence and wakes feeling well rested. Anticipate weight loss improved OSA. She politely declines repeat sleep study as she is sleeping well.   Obesity - s/p bariatric surgery with good response. Continued weight loss via diet and exercise encouraged.          Dispo: follow up in 1 year  Signed, Alver Sorrow, NP

## 2023-12-22 ENCOUNTER — Encounter (HOSPITAL_BASED_OUTPATIENT_CLINIC_OR_DEPARTMENT_OTHER): Payer: Self-pay

## 2024-01-25 ENCOUNTER — Other Ambulatory Visit (HOSPITAL_BASED_OUTPATIENT_CLINIC_OR_DEPARTMENT_OTHER): Payer: Self-pay | Admitting: Internal Medicine

## 2024-01-25 DIAGNOSIS — I4821 Permanent atrial fibrillation: Secondary | ICD-10-CM

## 2024-01-25 NOTE — Telephone Encounter (Addendum)
 Xarelto 20mg  refill request received. Pt is 68 years old, weight-105.9kg, Crea-0.84 on 09/18/23 via scanned labs from PCP, last seen by Gillian Shields on 12/21/23, Diagnosis-Afib, CrCl-111.63 mL/min; Dose is appropriate based on dosing criteria.

## 2024-03-22 DIAGNOSIS — I4819 Other persistent atrial fibrillation: Secondary | ICD-10-CM | POA: Diagnosis not present

## 2024-03-22 DIAGNOSIS — I10 Essential (primary) hypertension: Secondary | ICD-10-CM | POA: Diagnosis not present

## 2024-03-22 DIAGNOSIS — G894 Chronic pain syndrome: Secondary | ICD-10-CM | POA: Diagnosis not present

## 2024-03-26 DIAGNOSIS — I10 Essential (primary) hypertension: Secondary | ICD-10-CM | POA: Diagnosis not present

## 2024-03-26 DIAGNOSIS — I4819 Other persistent atrial fibrillation: Secondary | ICD-10-CM | POA: Diagnosis not present

## 2024-04-06 DIAGNOSIS — Z903 Acquired absence of stomach [part of]: Secondary | ICD-10-CM | POA: Diagnosis not present

## 2024-04-06 DIAGNOSIS — G4733 Obstructive sleep apnea (adult) (pediatric): Secondary | ICD-10-CM | POA: Diagnosis not present

## 2024-04-06 DIAGNOSIS — R7303 Prediabetes: Secondary | ICD-10-CM | POA: Diagnosis not present

## 2024-04-06 DIAGNOSIS — I10 Essential (primary) hypertension: Secondary | ICD-10-CM | POA: Diagnosis not present

## 2024-04-20 DIAGNOSIS — I10 Essential (primary) hypertension: Secondary | ICD-10-CM | POA: Diagnosis not present

## 2024-04-20 DIAGNOSIS — R7303 Prediabetes: Secondary | ICD-10-CM | POA: Diagnosis not present

## 2024-04-20 DIAGNOSIS — Z903 Acquired absence of stomach [part of]: Secondary | ICD-10-CM | POA: Diagnosis not present

## 2024-04-20 DIAGNOSIS — G4733 Obstructive sleep apnea (adult) (pediatric): Secondary | ICD-10-CM | POA: Diagnosis not present

## 2024-05-04 DIAGNOSIS — G4733 Obstructive sleep apnea (adult) (pediatric): Secondary | ICD-10-CM | POA: Diagnosis not present

## 2024-05-04 DIAGNOSIS — R7303 Prediabetes: Secondary | ICD-10-CM | POA: Diagnosis not present

## 2024-05-04 DIAGNOSIS — Z903 Acquired absence of stomach [part of]: Secondary | ICD-10-CM | POA: Diagnosis not present

## 2024-05-04 DIAGNOSIS — I10 Essential (primary) hypertension: Secondary | ICD-10-CM | POA: Diagnosis not present

## 2024-05-13 ENCOUNTER — Encounter (HOSPITAL_COMMUNITY): Payer: Self-pay | Admitting: *Deleted

## 2024-05-18 DIAGNOSIS — G4733 Obstructive sleep apnea (adult) (pediatric): Secondary | ICD-10-CM | POA: Diagnosis not present

## 2024-05-18 DIAGNOSIS — Z903 Acquired absence of stomach [part of]: Secondary | ICD-10-CM | POA: Diagnosis not present

## 2024-05-18 DIAGNOSIS — R7303 Prediabetes: Secondary | ICD-10-CM | POA: Diagnosis not present

## 2024-05-18 DIAGNOSIS — I10 Essential (primary) hypertension: Secondary | ICD-10-CM | POA: Diagnosis not present

## 2024-05-26 DIAGNOSIS — I10 Essential (primary) hypertension: Secondary | ICD-10-CM | POA: Diagnosis not present

## 2024-05-26 DIAGNOSIS — I4819 Other persistent atrial fibrillation: Secondary | ICD-10-CM | POA: Diagnosis not present

## 2024-06-01 DIAGNOSIS — Z903 Acquired absence of stomach [part of]: Secondary | ICD-10-CM | POA: Diagnosis not present

## 2024-06-01 DIAGNOSIS — R7303 Prediabetes: Secondary | ICD-10-CM | POA: Diagnosis not present

## 2024-06-01 DIAGNOSIS — G4733 Obstructive sleep apnea (adult) (pediatric): Secondary | ICD-10-CM | POA: Diagnosis not present

## 2024-06-01 DIAGNOSIS — I10 Essential (primary) hypertension: Secondary | ICD-10-CM | POA: Diagnosis not present

## 2024-06-15 DIAGNOSIS — I10 Essential (primary) hypertension: Secondary | ICD-10-CM | POA: Diagnosis not present

## 2024-06-15 DIAGNOSIS — Z903 Acquired absence of stomach [part of]: Secondary | ICD-10-CM | POA: Diagnosis not present

## 2024-06-15 DIAGNOSIS — G4733 Obstructive sleep apnea (adult) (pediatric): Secondary | ICD-10-CM | POA: Diagnosis not present

## 2024-06-15 DIAGNOSIS — R7303 Prediabetes: Secondary | ICD-10-CM | POA: Diagnosis not present

## 2024-06-29 DIAGNOSIS — Z903 Acquired absence of stomach [part of]: Secondary | ICD-10-CM | POA: Diagnosis not present

## 2024-06-29 DIAGNOSIS — I10 Essential (primary) hypertension: Secondary | ICD-10-CM | POA: Diagnosis not present

## 2024-06-29 DIAGNOSIS — E65 Localized adiposity: Secondary | ICD-10-CM | POA: Diagnosis not present

## 2024-06-29 DIAGNOSIS — R7303 Prediabetes: Secondary | ICD-10-CM | POA: Diagnosis not present

## 2024-06-29 DIAGNOSIS — G4733 Obstructive sleep apnea (adult) (pediatric): Secondary | ICD-10-CM | POA: Diagnosis not present

## 2024-07-12 DIAGNOSIS — Z1231 Encounter for screening mammogram for malignant neoplasm of breast: Secondary | ICD-10-CM | POA: Diagnosis not present

## 2024-07-13 ENCOUNTER — Other Ambulatory Visit: Payer: Self-pay

## 2024-07-13 ENCOUNTER — Other Ambulatory Visit (HOSPITAL_BASED_OUTPATIENT_CLINIC_OR_DEPARTMENT_OTHER): Payer: Self-pay

## 2024-07-13 DIAGNOSIS — E65 Localized adiposity: Secondary | ICD-10-CM | POA: Diagnosis not present

## 2024-07-13 DIAGNOSIS — I10 Essential (primary) hypertension: Secondary | ICD-10-CM | POA: Diagnosis not present

## 2024-07-13 DIAGNOSIS — R7303 Prediabetes: Secondary | ICD-10-CM | POA: Diagnosis not present

## 2024-07-13 DIAGNOSIS — G4733 Obstructive sleep apnea (adult) (pediatric): Secondary | ICD-10-CM | POA: Diagnosis not present

## 2024-07-13 DIAGNOSIS — Z903 Acquired absence of stomach [part of]: Secondary | ICD-10-CM | POA: Diagnosis not present

## 2024-07-13 MED ORDER — ZEPBOUND 5 MG/0.5ML ~~LOC~~ SOAJ
5.0000 mg | SUBCUTANEOUS | 0 refills | Status: DC
  Filled 2024-07-13: qty 2, 28d supply, fill #0

## 2024-07-26 ENCOUNTER — Other Ambulatory Visit (HOSPITAL_BASED_OUTPATIENT_CLINIC_OR_DEPARTMENT_OTHER): Payer: Self-pay | Admitting: Internal Medicine

## 2024-07-26 DIAGNOSIS — I4821 Permanent atrial fibrillation: Secondary | ICD-10-CM

## 2024-07-26 NOTE — Telephone Encounter (Signed)
 Xarelto  20mg  refill request received. Pt is 68 years old, weight-105.9kg, Crea-0.84 on 09/21/23 via scanned labs, last seen by Reche Finder on 12/21/23, Diagnosis-Afib & DVT, CrCl-110.14 mL/min; Dose is appropriate based on dosing criteria. Will send in refill to requested pharmacy.

## 2024-07-27 DIAGNOSIS — I10 Essential (primary) hypertension: Secondary | ICD-10-CM | POA: Diagnosis not present

## 2024-07-27 DIAGNOSIS — E65 Localized adiposity: Secondary | ICD-10-CM | POA: Diagnosis not present

## 2024-07-27 DIAGNOSIS — R7303 Prediabetes: Secondary | ICD-10-CM | POA: Diagnosis not present

## 2024-07-27 DIAGNOSIS — G4733 Obstructive sleep apnea (adult) (pediatric): Secondary | ICD-10-CM | POA: Diagnosis not present

## 2024-08-02 DIAGNOSIS — M19071 Primary osteoarthritis, right ankle and foot: Secondary | ICD-10-CM | POA: Diagnosis not present

## 2024-08-02 DIAGNOSIS — M79671 Pain in right foot: Secondary | ICD-10-CM | POA: Diagnosis not present

## 2024-08-02 DIAGNOSIS — Q666 Other congenital valgus deformities of feet: Secondary | ICD-10-CM | POA: Diagnosis not present

## 2024-08-24 DIAGNOSIS — I10 Essential (primary) hypertension: Secondary | ICD-10-CM | POA: Diagnosis not present

## 2024-08-24 DIAGNOSIS — G4733 Obstructive sleep apnea (adult) (pediatric): Secondary | ICD-10-CM | POA: Diagnosis not present

## 2024-08-24 DIAGNOSIS — R7303 Prediabetes: Secondary | ICD-10-CM | POA: Diagnosis not present

## 2024-08-24 DIAGNOSIS — Z903 Acquired absence of stomach [part of]: Secondary | ICD-10-CM | POA: Diagnosis not present

## 2024-08-24 DIAGNOSIS — E65 Localized adiposity: Secondary | ICD-10-CM | POA: Diagnosis not present

## 2024-09-27 ENCOUNTER — Other Ambulatory Visit (HOSPITAL_BASED_OUTPATIENT_CLINIC_OR_DEPARTMENT_OTHER): Payer: Self-pay

## 2024-09-27 MED ORDER — ZEPBOUND 5 MG/0.5ML ~~LOC~~ SOAJ
5.0000 mg | SUBCUTANEOUS | 1 refills | Status: AC
  Filled 2024-09-27 – 2024-10-11 (×3): qty 2, 28d supply, fill #0

## 2024-09-30 ENCOUNTER — Other Ambulatory Visit (HOSPITAL_BASED_OUTPATIENT_CLINIC_OR_DEPARTMENT_OTHER): Payer: Self-pay

## 2024-09-30 ENCOUNTER — Other Ambulatory Visit: Payer: Self-pay

## 2024-10-05 ENCOUNTER — Other Ambulatory Visit (HOSPITAL_COMMUNITY): Payer: Self-pay | Admitting: Family Medicine

## 2024-10-05 DIAGNOSIS — I77810 Thoracic aortic ectasia: Secondary | ICD-10-CM

## 2024-10-10 ENCOUNTER — Other Ambulatory Visit (HOSPITAL_BASED_OUTPATIENT_CLINIC_OR_DEPARTMENT_OTHER): Payer: Self-pay

## 2024-10-11 ENCOUNTER — Other Ambulatory Visit (HOSPITAL_BASED_OUTPATIENT_CLINIC_OR_DEPARTMENT_OTHER): Payer: Self-pay

## 2024-11-01 ENCOUNTER — Other Ambulatory Visit (HOSPITAL_BASED_OUTPATIENT_CLINIC_OR_DEPARTMENT_OTHER): Payer: Self-pay

## 2024-11-01 MED ORDER — ZEPBOUND 5 MG/0.5ML ~~LOC~~ SOAJ
5.0000 mg | SUBCUTANEOUS | 3 refills | Status: AC
  Filled 2024-11-01: qty 2, 28d supply, fill #0

## 2024-11-12 ENCOUNTER — Other Ambulatory Visit (HOSPITAL_BASED_OUTPATIENT_CLINIC_OR_DEPARTMENT_OTHER): Payer: Self-pay

## 2024-12-29 ENCOUNTER — Other Ambulatory Visit (HOSPITAL_COMMUNITY)
